# Patient Record
Sex: Male | Born: 1960 | Race: White | Hispanic: No | Marital: Single | Smoking: Never smoker
Health system: Southern US, Community
[De-identification: ages and names within clinical notes are randomized; demographics above are authoritative.]

## PROBLEM LIST (undated history)

## (undated) DIAGNOSIS — N289 Disorder of kidney and ureter, unspecified: Secondary | ICD-10-CM

## (undated) DIAGNOSIS — R569 Unspecified convulsions: Secondary | ICD-10-CM

## (undated) DIAGNOSIS — K219 Gastro-esophageal reflux disease without esophagitis: Secondary | ICD-10-CM

## (undated) DIAGNOSIS — E871 Hypo-osmolality and hyponatremia: Secondary | ICD-10-CM

## (undated) DIAGNOSIS — D649 Anemia, unspecified: Secondary | ICD-10-CM

## (undated) DIAGNOSIS — I10 Essential (primary) hypertension: Secondary | ICD-10-CM

## (undated) DIAGNOSIS — G43909 Migraine, unspecified, not intractable, without status migrainosus: Secondary | ICD-10-CM

## (undated) HISTORY — DX: Migraine, unspecified, not intractable, without status migrainosus: G43.909

## (undated) HISTORY — PX: OTHER SURGICAL HISTORY: SHX169

---

## 2010-07-18 ENCOUNTER — Emergency Department: Payer: Self-pay | Admitting: Emergency Medicine

## 2013-11-24 ENCOUNTER — Emergency Department: Payer: Self-pay | Admitting: Emergency Medicine

## 2013-11-26 ENCOUNTER — Inpatient Hospital Stay: Payer: Self-pay | Admitting: Internal Medicine

## 2013-11-26 LAB — CBC WITH DIFFERENTIAL/PLATELET
Basophil #: 0 10*3/uL (ref 0.0–0.1)
Basophil %: 0.3 %
Eosinophil #: 0 10*3/uL (ref 0.0–0.7)
Eosinophil %: 0.3 %
HCT: 26.6 % — ABNORMAL LOW (ref 40.0–52.0)
Lymphocyte %: 17.6 %
MCHC: 33.7 g/dL (ref 32.0–36.0)
MCV: 98 fL (ref 80–100)
Monocyte #: 0.6 x10 3/mm (ref 0.2–1.0)
Neutrophil %: 74.8 %
Platelet: 226 10*3/uL (ref 150–440)
RBC: 2.71 10*6/uL — ABNORMAL LOW (ref 4.40–5.90)
RDW: 14.2 % (ref 11.5–14.5)

## 2013-11-26 LAB — TROPONIN I: Troponin-I: <0.02 ng/mL

## 2013-11-26 LAB — COMPREHENSIVE METABOLIC PANEL
Albumin: 3.7 g/dL (ref 3.4–5.0)
Anion Gap: 12 (ref 7–16)
Bilirubin,Total: 0.8 mg/dL (ref 0.2–1.0)
Chloride: 109 mmol/L — ABNORMAL HIGH (ref 98–107)
Co2: 17 mmol/L — ABNORMAL LOW (ref 21–32)
Creatinine: 3.53 mg/dL — ABNORMAL HIGH (ref 0.60–1.30)
EGFR (African American): 21 — ABNORMAL LOW
Glucose: 122 mg/dL — ABNORMAL HIGH (ref 65–99)
Potassium: 4.8 mmol/L (ref 3.5–5.1)
Total Protein: 7.5 g/dL (ref 6.4–8.2)

## 2013-11-26 LAB — TSH: Thyroid Stimulating Horm: 1.97 u[IU]/mL

## 2013-11-26 LAB — ETHANOL: Ethanol %: <0.003 % (ref 0.000–0.080)

## 2013-11-27 LAB — URINALYSIS, COMPLETE
Bilirubin,UR: NEGATIVE
Glucose,UR: NEGATIVE mg/dL (ref 0–75)
Ketone: NEGATIVE
Leukocyte Esterase: NEGATIVE
Protein: NEGATIVE
Specific Gravity: 1.011 (ref 1.003–1.030)
WBC UR: 1 /HPF (ref 0–5)

## 2013-11-27 LAB — CBC WITH DIFFERENTIAL/PLATELET
Basophil %: 0.1 %
Eosinophil #: 0 10*3/uL (ref 0.0–0.7)
HCT: 23.3 % — ABNORMAL LOW (ref 40.0–52.0)
Lymphocyte #: 2.9 10*3/uL (ref 1.0–3.6)
Lymphocyte %: 31.7 %
MCH: 33.5 pg (ref 26.0–34.0)
MCHC: 34.2 g/dL (ref 32.0–36.0)
Monocyte %: 10.5 %
Neutrophil #: 5.3 10*3/uL (ref 1.4–6.5)
Neutrophil %: 57.3 %
Platelet: 192 10*3/uL (ref 150–440)
RBC: 2.38 10*6/uL — ABNORMAL LOW (ref 4.40–5.90)
RDW: 14.6 % — ABNORMAL HIGH (ref 11.5–14.5)
WBC: 9.2 10*3/uL (ref 3.8–10.6)

## 2013-11-27 LAB — BASIC METABOLIC PANEL
Anion Gap: 9 (ref 7–16)
Creatinine: 2.7 mg/dL — ABNORMAL HIGH (ref 0.60–1.30)
EGFR (African American): 30 — ABNORMAL LOW
EGFR (Non-African Amer.): 26 — ABNORMAL LOW
Osmolality: 294 (ref 275–301)
Potassium: 4.5 mmol/L (ref 3.5–5.1)
Sodium: 139 mmol/L (ref 136–145)

## 2013-11-27 LAB — DRUG SCREEN, URINE
Amphetamines, Ur Screen: NEGATIVE (ref ?–1000)
Barbiturates, Ur Screen: NEGATIVE (ref ?–200)
Cocaine Metabolite,Ur ~~LOC~~: NEGATIVE (ref ?–300)
MDMA (Ecstasy)Ur Screen: NEGATIVE (ref ?–500)
Methadone, Ur Screen: NEGATIVE (ref ?–300)
Opiate, Ur Screen: POSITIVE (ref ?–300)
Tricyclic, Ur Screen: NEGATIVE (ref ?–1000)

## 2013-11-27 LAB — LIPID PANEL
HDL Cholesterol: 29 mg/dL — ABNORMAL LOW (ref 40–60)
Ldl Cholesterol, Calc: 123 mg/dL — ABNORMAL HIGH (ref 0–100)
VLDL Cholesterol, Calc: 44 mg/dL — ABNORMAL HIGH (ref 5–40)

## 2013-11-27 LAB — PROTEIN / CREATININE RATIO, URINE
Creatinine, Urine: 67.1 mg/dL (ref 30.0–125.0)
Protein, Random Urine: 15 mg/dL — ABNORMAL HIGH (ref 0–12)
Protein/Creat. Ratio: 224 mg/gCREAT — ABNORMAL HIGH (ref 0–200)

## 2013-11-27 LAB — FERRITIN: Ferritin (ARMC): 564 ng/mL — ABNORMAL HIGH (ref 8–388)

## 2013-11-27 LAB — IRON AND TIBC
Iron Saturation: 61 %
Unbound Iron-Bind.Cap.: 109 ug/dL

## 2013-11-28 LAB — BASIC METABOLIC PANEL
Anion Gap: 5 — ABNORMAL LOW (ref 7–16)
BUN: 35 mg/dL — ABNORMAL HIGH (ref 7–18)
Calcium, Total: 8 mg/dL — ABNORMAL LOW (ref 8.5–10.1)
Chloride: 108 mmol/L — ABNORMAL HIGH (ref 98–107)
Creatinine: 1.92 mg/dL — ABNORMAL HIGH (ref 0.60–1.30)
EGFR (African American): 45 — ABNORMAL LOW
Glucose: 91 mg/dL (ref 65–99)
Sodium: 136 mmol/L (ref 136–145)

## 2013-11-28 LAB — SODIUM, URINE, RANDOM: Sodium, Urine Random: 48 mmol/L (ref 20–110)

## 2013-11-29 LAB — CBC WITH DIFFERENTIAL/PLATELET
Basophil #: 0 10*3/uL (ref 0.0–0.1)
Basophil %: 0.2 %
Eosinophil #: 0.1 10*3/uL (ref 0.0–0.7)
Eosinophil %: 1.8 %
Lymphocyte #: 1.7 10*3/uL (ref 1.0–3.6)
Lymphocyte %: 33.5 %
MCH: 33.1 pg (ref 26.0–34.0)
MCV: 98 fL (ref 80–100)
Neutrophil #: 2.8 10*3/uL (ref 1.4–6.5)
Neutrophil %: 53.5 %
RBC: 1.96 10*6/uL — ABNORMAL LOW (ref 4.40–5.90)

## 2013-11-29 LAB — BASIC METABOLIC PANEL
Anion Gap: 6 — ABNORMAL LOW (ref 7–16)
BUN: 23 mg/dL — ABNORMAL HIGH (ref 7–18)
Calcium, Total: 8.1 mg/dL — ABNORMAL LOW (ref 8.5–10.1)
Chloride: 111 mmol/L — ABNORMAL HIGH (ref 98–107)
Co2: 22 mmol/L (ref 21–32)
Creatinine: 1.62 mg/dL — ABNORMAL HIGH (ref 0.60–1.30)
EGFR (African American): 55 — ABNORMAL LOW
EGFR (Non-African Amer.): 47 — ABNORMAL LOW
Osmolality: 281 (ref 275–301)
Potassium: 4.9 mmol/L (ref 3.5–5.1)

## 2013-11-29 LAB — URINE CULTURE

## 2013-11-30 LAB — CBC WITH DIFFERENTIAL/PLATELET
Basophil #: 0 10*3/uL (ref 0.0–0.1)
Basophil %: 0.2 %
Eosinophil #: 0.1 10*3/uL (ref 0.0–0.7)
Eosinophil %: 2.2 %
HGB: 7.7 g/dL — ABNORMAL LOW (ref 13.0–18.0)
MCH: 31.5 pg (ref 26.0–34.0)
MCHC: 33.4 g/dL (ref 32.0–36.0)
MCV: 94 fL (ref 80–100)
Monocyte #: 0.7 x10 3/mm (ref 0.2–1.0)
Neutrophil #: 3.3 10*3/uL (ref 1.4–6.5)
Neutrophil %: 53 %
Platelet: 162 10*3/uL (ref 150–440)
RBC: 2.43 10*6/uL — ABNORMAL LOW (ref 4.40–5.90)

## 2013-11-30 LAB — BASIC METABOLIC PANEL
Anion Gap: 4 — ABNORMAL LOW (ref 7–16)
BUN: 17 mg/dL (ref 7–18)
Calcium, Total: 8.6 mg/dL (ref 8.5–10.1)
Chloride: 107 mmol/L (ref 98–107)
Creatinine: 1.71 mg/dL — ABNORMAL HIGH (ref 0.60–1.30)
EGFR (African American): 51 — ABNORMAL LOW
EGFR (Non-African Amer.): 44 — ABNORMAL LOW
Glucose: 93 mg/dL (ref 65–99)
Potassium: 4.7 mmol/L (ref 3.5–5.1)
Sodium: 137 mmol/L (ref 136–145)

## 2013-12-01 LAB — CBC WITH DIFFERENTIAL/PLATELET
Basophil #: 0 10*3/uL (ref 0.0–0.1)
Eosinophil #: 0.1 10*3/uL (ref 0.0–0.7)
Eosinophil %: 1.9 %
HGB: 8.1 g/dL — ABNORMAL LOW (ref 13.0–18.0)
MCHC: 34.6 g/dL (ref 32.0–36.0)
MCV: 94 fL (ref 80–100)
Monocyte %: 10.2 %
Neutrophil #: 3.9 10*3/uL (ref 1.4–6.5)
RDW: 14.9 % — ABNORMAL HIGH (ref 11.5–14.5)
WBC: 6.6 10*3/uL (ref 3.8–10.6)

## 2013-12-01 LAB — BASIC METABOLIC PANEL
Calcium, Total: 8.9 mg/dL (ref 8.5–10.1)
Co2: 27 mmol/L (ref 21–32)
Creatinine: 1.62 mg/dL — ABNORMAL HIGH (ref 0.60–1.30)
Osmolality: 271 (ref 275–301)

## 2013-12-01 LAB — UR PROT ELECTROPHORESIS, URINE RANDOM

## 2014-07-26 ENCOUNTER — Emergency Department: Payer: Self-pay | Admitting: Emergency Medicine

## 2014-07-26 LAB — URINALYSIS, COMPLETE
BILIRUBIN, UR: NEGATIVE
Bacteria: NONE SEEN
Blood: NEGATIVE
GLUCOSE, UR: NEGATIVE mg/dL (ref 0–75)
KETONE: NEGATIVE
Leukocyte Esterase: NEGATIVE
Nitrite: NEGATIVE
Ph: 6 (ref 4.5–8.0)
Protein: NEGATIVE
Specific Gravity: 1.006 (ref 1.003–1.030)
Squamous Epithelial: <1

## 2014-07-26 LAB — CBC WITH DIFFERENTIAL/PLATELET
Basophil #: 0 10*3/uL (ref 0.0–0.1)
Basophil %: 0.6 %
EOS ABS: 0.2 10*3/uL (ref 0.0–0.7)
Eosinophil %: 5.6 %
HCT: 34.7 % — AB (ref 40.0–52.0)
HGB: 11.5 g/dL — ABNORMAL LOW (ref 13.0–18.0)
Lymphocyte #: 1.3 10*3/uL (ref 1.0–3.6)
Lymphocyte %: 31.1 %
MCH: 33.2 pg (ref 26.0–34.0)
MCHC: 33.2 g/dL (ref 32.0–36.0)
MCV: 100 fL (ref 80–100)
MONO ABS: 0.4 x10 3/mm (ref 0.2–1.0)
Monocyte %: 10 %
NEUTROS ABS: 2.2 10*3/uL (ref 1.4–6.5)
NEUTROS PCT: 52.7 %
Platelet: 199 10*3/uL (ref 150–440)
RBC: 3.47 10*6/uL — ABNORMAL LOW (ref 4.40–5.90)
RDW: 13.7 % (ref 11.5–14.5)
WBC: 4.1 10*3/uL (ref 3.8–10.6)

## 2014-07-26 LAB — COMPREHENSIVE METABOLIC PANEL
ALBUMIN: 3.7 g/dL (ref 3.4–5.0)
ALT: 15 U/L
Alkaline Phosphatase: 57 U/L
Anion Gap: 12 (ref 7–16)
BUN: 16 mg/dL (ref 7–18)
Bilirubin,Total: 0.2 mg/dL (ref 0.2–1.0)
CHLORIDE: 91 mmol/L — AB (ref 98–107)
Calcium, Total: 8.8 mg/dL (ref 8.5–10.1)
Co2: 24 mmol/L (ref 21–32)
Creatinine: 1.13 mg/dL (ref 0.60–1.30)
EGFR (African American): >60
EGFR (Non-African Amer.): >60
GLUCOSE: 85 mg/dL (ref 65–99)
Osmolality: 256 (ref 275–301)
Potassium: 3.9 mmol/L (ref 3.5–5.1)
SGOT(AST): 13 U/L — ABNORMAL LOW (ref 15–37)
Sodium: 127 mmol/L — ABNORMAL LOW (ref 136–145)
Total Protein: 7.4 g/dL (ref 6.4–8.2)

## 2014-07-26 LAB — LIPASE, BLOOD: Lipase: 196 U/L (ref 73–393)

## 2014-07-26 LAB — VALPROIC ACID LEVEL: Valproic Acid: 82 ug/mL

## 2014-10-06 ENCOUNTER — Ambulatory Visit: Payer: Self-pay | Admitting: Nephrology

## 2014-10-06 LAB — COMPREHENSIVE METABOLIC PANEL
ANION GAP: 6 — AB (ref 7–16)
Albumin: 3.6 g/dL (ref 3.4–5.0)
Alkaline Phosphatase: 50 U/L
BILIRUBIN TOTAL: 0.3 mg/dL (ref 0.2–1.0)
BUN: 25 mg/dL — ABNORMAL HIGH (ref 7–18)
Calcium, Total: 8.5 mg/dL (ref 8.5–10.1)
Chloride: 101 mmol/L (ref 98–107)
Co2: 28 mmol/L (ref 21–32)
Creatinine: 1.1 mg/dL (ref 0.60–1.30)
GLUCOSE: 97 mg/dL (ref 65–99)
Osmolality: 274 (ref 275–301)
Potassium: 4.9 mmol/L (ref 3.5–5.1)
SGOT(AST): 19 U/L (ref 15–37)
SGPT (ALT): 17 U/L
SODIUM: 135 mmol/L — AB (ref 136–145)
TOTAL PROTEIN: 6.8 g/dL (ref 6.4–8.2)

## 2014-10-06 LAB — CBC WITH DIFFERENTIAL/PLATELET
BASOS ABS: 0 10*3/uL (ref 0.0–0.1)
BASOS PCT: 0.7 %
EOS ABS: 0.2 10*3/uL (ref 0.0–0.7)
EOS PCT: 4.4 %
HCT: 30 % — AB (ref 40.0–52.0)
HGB: 9.8 g/dL — ABNORMAL LOW (ref 13.0–18.0)
LYMPHS PCT: 38.6 %
Lymphocyte #: 1.7 10*3/uL (ref 1.0–3.6)
MCH: 32.5 pg (ref 26.0–34.0)
MCHC: 32.7 g/dL (ref 32.0–36.0)
MCV: 99 fL (ref 80–100)
MONO ABS: 0.5 x10 3/mm (ref 0.2–1.0)
Monocyte %: 10.6 %
Neutrophil #: 2 10*3/uL (ref 1.4–6.5)
Neutrophil %: 45.7 %
PLATELETS: 175 10*3/uL (ref 150–440)
RBC: 3.02 10*6/uL — AB (ref 4.40–5.90)
RDW: 14.3 % (ref 11.5–14.5)
WBC: 4.3 10*3/uL (ref 3.8–10.6)

## 2014-10-06 LAB — PROTEIN / CREATININE RATIO, URINE
Creatinine, Urine: 57.8 mg/dL (ref 30.0–125.0)
PROTEIN/CREAT. RATIO: 225 mg/g{creat} — AB (ref 0–200)
Protein, Random Urine: 13 mg/dL — ABNORMAL HIGH (ref 0–12)

## 2014-10-06 LAB — PHOSPHORUS: Phosphorus: 3.5 mg/dL (ref 2.5–4.9)

## 2015-01-27 ENCOUNTER — Inpatient Hospital Stay: Payer: Self-pay | Admitting: Internal Medicine

## 2015-03-10 ENCOUNTER — Emergency Department: Payer: Self-pay | Admitting: Emergency Medicine

## 2015-03-10 LAB — CBC
HCT: 27.1 % — ABNORMAL LOW (ref 40.0–52.0)
HGB: 9 g/dL — AB (ref 13.0–18.0)
MCH: 32.8 pg (ref 26.0–34.0)
MCHC: 33.1 g/dL (ref 32.0–36.0)
MCV: 99 fL (ref 80–100)
Platelet: 148 10*3/uL — ABNORMAL LOW (ref 150–440)
RBC: 2.73 10*6/uL — AB (ref 4.40–5.90)
RDW: 16.2 % — ABNORMAL HIGH (ref 11.5–14.5)
WBC: 3.8 10*3/uL (ref 3.8–10.6)

## 2015-03-10 LAB — BASIC METABOLIC PANEL
Anion Gap: 9 (ref 7–16)
BUN: 7 mg/dL
CALCIUM: 8.8 mg/dL — AB
CHLORIDE: 96 mmol/L — AB
Co2: 26 mmol/L
Creatinine: 1.19 mg/dL
EGFR (African American): >60
EGFR (Non-African Amer.): >60
Glucose: 87 mg/dL
POTASSIUM: 4.5 mmol/L
SODIUM: 131 mmol/L — AB

## 2015-04-08 NOTE — Discharge Summary (Signed)
PATIENT NAMELAKENDRICK, [name] MR#:  [id number] DATE OF BIRTH:  November 21, 1961  DATE OF ADMISSION:  11/26/2013 DATE OF DISCHARGE:  12/01/2013  DISCHARGE DIAGNOSES:  1. Acute on chronic kidney disease, likely due to acute tubular necrosis, now improving and close to baseline.  2. 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 Hemoglobin of 8.1 on the date of discharge status post 1 unit of packed red blood cell transfusion while in the hospital.   SECONDARY DIAGNOSES:  1. Hypertension.  2. Seizure disorder.   CONSULTATION: Nephrology, Dr. Anthonette Legato.   PROCEDURES AND RADIOLOGY: Chest x-ray on the 11th of December showed no acute cardiopulmonary disease.   Bilateral kidney ultrasound on the 11th of December showed medical renal disease. No focal mass or hydronephrosis.   MAJOR LABORATORY PANEL: UA on admission was negative.   Urine culture was contaminated on the 12th of December. Stool for C. difficile was negative on the 14th of December. Serum ANA was negative. ANCA panel was negative. Anti-glomerular basement membrane antibody was within normal limits. Serum complement C3 level was low with a value of 83. Serum complement C4 was within normal limits. Serum protein electrophoresis was within normal limits except low total serum protein with a value of 4.8 and low total albumin with a value of 2.8.   Urine eosinophil was negative. Urine protein electrophoresis was within normal limits.    Stool Hemoccult was negative.   HISTORY AND SHORT HOSPITAL COURSE: The patient is a 54 year old male with the above-mentioned medical problems who was admitted for generalized weakness and poor p.o. intake. He was found to have acute renal failure which was thought to be is likely secondary to ATN based on nephrology consultation. The patient was slowly improving on gentle hydration with underlying prolonged dehydration as well as concomitant use of  ARB/hydrochlorothiazide. The patient also had episode of diarrhea which was transient and was resolved during the inpatient hospital stay. No infectious etiology was detected. The patient was found to have anemia of chronic kidney disease which was worsening and required 1 unit of packed red blood cell transfusion, and his hemodynamics were stable with hemoglobin on the date of discharge of 8.1. He was close to his baseline and was discharged home on the 16th of December.   VITAL SIGNS: On the date of discharge, his vital signs were as follows: Temperature 98.4, heart rate 86 per minute, respirations 17 per minute, blood pressure 127/84 mmHg. He was saturating 96% on room air.   PERTINENT PHYSICAL EXAMINATION: On the date of discharge: CARDIOVASCULAR: S1, S2 normal. No murmurs, rubs or gallop.  LUNGS: Clear to auscultation bilaterally. No wheezing, rales, rhonchi or crepitation.  ABDOMEN: Soft, benign.  NEUROLOGIC: Nonfocal examination.   All other physical examination remained at baseline.   DISCHARGE MEDICATIONS:  1. Carbamazepine 200 mg p.o. daily.  2. Propranolol 40 mg 1/2 tablet p.o. at bedtime.  3. Divalproex sodium per home dosage.   DISCHARGE DIET: Regular.   DISCHARGE ACTIVITY: As tolerated.   DISCHARGE INSTRUCTIONS AND FOLLOWUP: The patient was instructed to follow up with his primary care physician, Dr. [name], in 1 to 2 weeks. He will need followup with Dr. Murlean Iba from nephrology in 2 to 4 weeks.   TOTAL TIME DISCHARGING THIS PATIENT: 45 minutes.    ____________________________ Lucina Mellow. Manuella Ghazi, MD vss:gb D: 12/02/2013 23:17:20 ET T: 12/03/2013 02:44:44 ET JOB#: [id number]  cc: Garrie Elenes S. Manuella Ghazi, MD, <Dictator> Munsoor Lilian Kapur, MD Youlanda Roys. Lovie Macadamia,  MD  Remer Macho MD ELECTRONICALLY SIGNED 12/07/2013 7:48

## 2015-04-08 NOTE — H&P (Signed)
PATIENT NAMEMITESH, [name] MR#:  [id number] DATE OF BIRTH:  17-Oct-1961  DATE OF ADMISSION:  11/26/2013  PRIMARY CARE PHYSICIAN: None local.  REFERRING PHYSICIAN:  Dr. Thomasene Lot.  CHIEF COMPLAINT: Generalized weakness, poor oral intake for 1 month, worsening for the past 2 days.   HISTORY OF PRESENT ILLNESS: A 54 year old Caucasian male with a history of hypertension, seizure disorder, chronic speech problem, and transgender, who presented to the ED with the above chief complaint. The patient is alert, awake, oriented, has a tremor, slurred speech and mild confusion.   According to the patient, the patient has had generalized weakness for the past 1 month with very poor oral intake and worsening for the past 2 days. The patient even could not walk properly. He feels thirsty and dizzy, but the patient denies any fever or chills. No headache. No chest pain, palpitation. No abdominal pain, nausea, vomiting or diarrhea The patient denies any melena or bloody stool.   The patient was noted to have an increased BUN 72, creatinine at 3.52 and is admitted for acute renal failure.   PAST MEDICAL HISTORY: Hypertension, seizure disorder.   PAST SURGICAL HISTORY: No.   SOCIAL HISTORY: No smoking or drinking or illicit drugs and is not sexually active. Denies any sexually transmitted disease.   FAMILY HISTORY: Father had a heart attack and stroke.   ALLERGIES: None.   HOME MEDICATIONS: Depakote 500 mg p.o. b.i.d., propranolol 60 mg p.o. daily.   REVIEW OF SYSTEMS: CONSTITUTIONAL: Denies any fever or chills. No headache but has dizziness and weakness.  EYES: No double vision, blurry vision.  ENT: No postnasal drip but has a slurred speech and no dysphagia.  CARDIOVASCULAR: No chest pain, palpitation, orthopnea, or nocturnal dyspnea. No leg edema.  PULMONARY: No cough, sputum, shortness of breath or hemoptysis.  GASTROINTESTINAL: No abdominal pain, nausea, vomiting or diarrhea. No melena or bloody  stool.  GENITOURINARY: No dysuria, hematuria, or incontinence.  SKIN: No rash or jaundice.  NEUROLOGY: No syncope, loss of consciousness or seizure.  HEMATOLOGIC: No easy bruising or bleeding.  ENDOCRINE: No polyuria, polydipsia, heat or cold intolerance.   PHYSICAL EXAMINATION:  VITAL SIGNS: Temperature 97.6, blood pressure 138/62, pulse 78, respirations 18,  GENERAL: Awake, alert, oriented, in no acute distress.  HEENT: Pupils round, equal and reactive to light and accommodation. Dry oral mucosa. Clear oropharynx.  NECK: Supple. No JVD or carotid bruits. No lymphadenopathy. No thyromegaly.  CARDIOVASCULAR: S1, S2, regular rate and rhythm. No murmurs or gallops.  PULMONARY: Bilateral air entry. No wheezing or rales. No use of accessory muscle to breathe. ABDOMEN:  Soft. No distention or tenderness. No organomegaly. Bowel sounds present.  EXTREMITIES: No edema, clubbing or cyanosis. No calf tenderness. Bilateral pedal pulses present.  SKIN: No rash or jaundice.  NEUROLOGIC: A and O x3. No focal deficit. Power 5/5. Sensation intact.   LABORATORY DATA: WBC 8.1, hemoglobin 9.0, platelets 226. Glucose 122, BUN 72, creatinine 3.53, sodium 138, potassium 4.8, chloride 107, bicarb 17, anion gap 12. TSH 1.97. Troponin less than 0.02. Ethanol level less than 3.   EKG showed normal sinus rhythm at 68 BPM.   IMPRESSIONS:  1.  Acute renal failure.  2.  Anemia.  3.  Hypertension.  4.  Seizure disorder.     PLAN OF TREATMENT:  1.  The patient will be admitted to the medical floor. We will start on normal saline and follow up BMP, kidney and bladder ultrasound. We will get a nephrology consult.  2.  For anemia, will follow up CBC.  3.  For hypertension, will continue propranolol.  4.  For seizure disorder, continue Depakote.  5.  GI and DVT prophylaxes.   I discussed the patient's condition and the plan of treatment with the patient.   CODE STATUS: THE PATIENT WANTS FULL CODE.   TIME SPENT:  About 58 minutes.    ____________________________ Demetrios Loll, MD qc:np D: 11/26/2013 20:31:25 ET T: 11/26/2013 21:37:52 ET JOB#: [id number]  cc: Demetrios Loll, MD, <Dictator> Demetrios Loll MD ELECTRONICALLY SIGNED 11/30/2013 14:37

## 2015-04-09 NOTE — Consult Note (Signed)
PATIENT NAME:  [name], [name] MR#:  [id number] DATE OF BIRTH:  08/31/1961  DATE OF CONSULTATION:  11/27/2013  REFERRING PHYSICIAN:  Demetrios Loll, MD CONSULTING PHYSICIAN:  Alika Saladin Lilian Kapur, MD  REASON FOR CONSULTATION: Acute renal failure.   HISTORY OF PRESENT ILLNESS: The patient is a very pleasant 54 year old Caucasian male with past medical history of hypertension, seizure disorder, chronic speech impediment, who presented to [facility] with generalized weakness and poor p.o. intake. The patient reports that he has had rather poor p.o. intake over the past month and has been eating and drinking very little. He states that he had an upper respiratory infection and lost much of his appetite thereafter. His weakness has been progressive in nature, especially over the past 2 days. Upon presentation here, the patient was found to have a creatinine of 3.53, BUN of 72, serum bicarbonate of 17. At home, he states that he was taking valsartan/hydrochlorothiazide and kept taking this despite poor p.o. intake. He had a urinalysis here which was negative for protein, less than 1 RBC per high-power field and 1 WBC per high-power field. He was started on IV fluid hydration with 0.9 normal saline at 150 mL per hour. This has lead to a creatinine now of 2.7. He also had a renal ultrasound performed which was negative for hydronephrosis; however, there was increased echogenicity bilateral, which is consistent with medical renal disease. We called Dr. Reuel Boom office, with whom he follows with for primary care, and it appears that on 06/23/2013 his creatinine was 0.9. Therefore, it appears that he most likely has acute renal failure at this time.   PAST MEDICAL HISTORY:  1. Hypertension.  2. Seizure disorder.  3. Chronic speech impediment.   PAST SURGICAL HISTORY: None.  ALLERGIES: No known drug allergies.   CURRENT INPATIENT MEDICATIONS: Include: 1. Acetaminophen 650 mg p.o. q.4 hours  p.r.n. 2. Heparin 5000 subcutaneous q.8 hours. 3. Zofran 4 mg IV q.4 hours p.r.n. 4. Pantoprazole 40 mg p.o. daily. 5. Depakote 500 mg p.o. q.12 hours.  6. Morphine 0.5 mg IV q.6 hours.  7. Propranolol 60 mg p.o. daily.   SOCIAL HISTORY: The patient resides at home. He is single. He has no children. He works as a IT sales professional. He denies tobacco use. Drinks alcohol occasionally. Denies illicit drug use.   FAMILY HISTORY: The patient's father apparently had polycystic kidney disease and then developed renal cell carcinoma. He also had some type of a liver malignancy. He was never on dialysis.   REVIEW OF SYSTEMS:  CONSTITUTIONAL: The patient reports significantly decreased appetite and poor p.o. intake. He is unclear as to whether he has had significant weight loss.  EYES: Denies diplopia or blurry vision.  HEENT: Denies headaches or hearing loss. Denies epistaxis.  CARDIOVASCULAR: Denies chest pain, palpitations, PND.  RESPIRATORY: Denies cough, shortness of breath or hemoptysis.  GASTROINTESTINAL: Reports diminished appetite and some nausea, but denies vomiting. Denies blood in the stool.  GENITOURINARY: Denies frequency, urgency or dysuria.  MUSCULOSKELETAL: Denies joint pain, swelling or redness.  INTEGUMENTARY: Denies skin rashes or lesions.  NEUROLOGIC: Has history of seizure disorder.  PSYCHIATRIC: Denies history of bipolar disorder or depression.  ENDOCRINE: Denies polyuria, polydipsia or polyphagia.  HEMATOLOGIC AND LYMPHATIC: Denies easy bruisability, bleeding or swollen lymph nodes.  ALLERGY AND IMMUNOLOGIC: Denies seasonal allergies or history of immunodeficiency.   PHYSICAL EXAMINATION:  VITAL SIGNS: Temperature 98.4, pulse 67, respirations 19, blood pressure 86/52.  GENERAL: Reveals a disheveled male, currently in no  acute distress.  HEENT: Normocephalic, atraumatic. Extraocular movements are intact. Pupils equal, round and reactive to light. No scleral icterus.  Conjunctivae are pale. No epistaxis noted. Gross hearing intact. Oral mucosa is quite dry.  NECK: Supple, without JVD or lymphadenopathy.  LUNGS: Clear to auscultation bilaterally with normal respiratory effort.  CARDIOVASCULAR: S1, S2. Regular rate and rhythm. No murmurs, rubs or gallops appreciated.  ABDOMEN: Soft, nontender, nondistended. Bowel sounds positive. No rebound or guarding. No gross organomegaly appreciated.  EXTREMITIES: No clubbing, cyanosis or edema.  NEUROLOGIC: The patient is alert and oriented to time, person and place. His speech is slow at times, and there is some mild stuttering noted.  SKIN: Warm and dry. Decreased skin turgor noted.  MUSCULOSKELETAL: No joint redness, swelling or tenderness appreciated.  PSYCHIATRIC: The patient with appropriate affect and appears to have good insight into his current illness.   LABORATORY DATA: Sodium 139, potassium 4.5, chloride 112, CO2 18, BUN 59, creatinine 2.7, glucose 103. Iron saturation 61%. Total protein 7.5, albumin 3.7, total bilirubin 0.8, alkaline phosphatase 39, AST 22, ALT 20. CK of 61. Troponin of less than 0.02. TSH 1.97. A urine drug screen is positive for opiates. CBC shows WBC is 9.2, hemoglobin 8, hematocrit 23, platelets 192, MCV of 98. Urine protein to creatinine ratio is 0.2. Urinalysis is negative for blood, negative for protein, less than 1 RBC per high-power field, 1 WBC per high-power field.   IMPRESSION: This is a 54 year old Caucasian male with a past medical history of hypertension, seizure disorder, chronic speech disorder, who presented to [facility] with decreased p.o. intake for 1 month and found to have acute renal failure.   1. Acute renal failure. The patient has acute tubular necrosis as a cause of his acute renal failure. The most likely cause is prolonged dehydration as well as concomitant use of valsartan/hydrochlorothiazide at home. The patient is responding to IV fluid  hydration, though his creatinine has not yet normalized. Creatinine currently is 2.7 after hydration overnight. Renal ultrasound was negative for hydronephrosis, though there was increased echogenicity bilateral. We will perform further workup including SPEP, UPEP, ANA, ANCA antibodies, GBM antibodies, C3, C4. No indication for dialysis at this point in time.  2. Metabolic acidosis. Serum bicarbonate was found to be 18. This is slightly better than yesterday. If serum bicarbonate remains low, we may need to consider bicarbonate drip, but will hold off for now.  3. Anemia, not otherwise specified. The patient's hemoglobin was 9 upon admission and now down to 8. In part, this may be dilutional. Iron studies showed an iron saturation of 61%. We will check SPEP and UPEP as well.  4. Hypertension. Blood pressure actually a bit low at present. We agree with holding valsartan/hydrochlorothiazide at this point in time. The patient is on propranolol at present.   I would like to thank Dr. Bridgett Larsson for this kind referral. Further plan as the patient progresses.    ____________________________ Tama High, MD mnl:lb D: 11/27/2013 14:52:34 ET T: 11/27/2013 15:04:41 ET JOB#: [id number]  cc: Tama High, MD, <Dictator> Mariah Milling Elvy Mclarty MD ELECTRONICALLY SIGNED 12/24/2013 11:25

## 2015-04-17 ENCOUNTER — Observation Stay
Admission: EM | Admit: 2015-04-17 | Discharge: 2015-04-18 | Disposition: A | Discharge status: Home / Self Care | Payer: Self-pay | Attending: Internal Medicine | Admitting: Internal Medicine

## 2015-04-17 ENCOUNTER — Encounter: Payer: Self-pay | Admitting: *Deleted

## 2015-04-17 ENCOUNTER — Observation Stay: Payer: Self-pay

## 2015-04-17 DIAGNOSIS — I10 Essential (primary) hypertension: Secondary | ICD-10-CM | POA: Insufficient documentation

## 2015-04-17 DIAGNOSIS — D649 Anemia, unspecified: Secondary | ICD-10-CM | POA: Insufficient documentation

## 2015-04-17 DIAGNOSIS — Z79899 Other long term (current) drug therapy: Secondary | ICD-10-CM | POA: Insufficient documentation

## 2015-04-17 DIAGNOSIS — R0789 Other chest pain: Principal | ICD-10-CM | POA: Insufficient documentation

## 2015-04-17 DIAGNOSIS — R569 Unspecified convulsions: Secondary | ICD-10-CM | POA: Insufficient documentation

## 2015-04-17 DIAGNOSIS — I129 Hypertensive chronic kidney disease with stage 1 through stage 4 chronic kidney disease, or unspecified chronic kidney disease: Secondary | ICD-10-CM | POA: Insufficient documentation

## 2015-04-17 DIAGNOSIS — N183 Chronic kidney disease, stage 3 (moderate): Secondary | ICD-10-CM | POA: Insufficient documentation

## 2015-04-17 DIAGNOSIS — R079 Chest pain, unspecified: Secondary | ICD-10-CM

## 2015-04-17 DIAGNOSIS — Z8249 Family history of ischemic heart disease and other diseases of the circulatory system: Secondary | ICD-10-CM | POA: Insufficient documentation

## 2015-04-17 DIAGNOSIS — I259 Chronic ischemic heart disease, unspecified: Secondary | ICD-10-CM | POA: Diagnosis present

## 2015-04-17 DIAGNOSIS — Z8489 Family history of other specified conditions: Secondary | ICD-10-CM | POA: Insufficient documentation

## 2015-04-17 HISTORY — DX: Essential (primary) hypertension: I10

## 2015-04-17 HISTORY — DX: Disorder of kidney and ureter, unspecified: N28.9

## 2015-04-17 HISTORY — DX: Unspecified convulsions: R56.9

## 2015-04-17 HISTORY — DX: Anemia, unspecified: D64.9

## 2015-04-17 LAB — CBC WITH DIFFERENTIAL/PLATELET
Basophils Absolute: 0 10*3/uL (ref 0–0.1)
EOS ABS: 0 10*3/uL (ref 0–0.7)
Eosinophils Relative: 1 %
HCT: 30.1 % — ABNORMAL LOW (ref 40.0–52.0)
Hemoglobin: 10.3 g/dL — ABNORMAL LOW (ref 13.0–18.0)
Lymphocytes Relative: 40 %
Lymphs Abs: 1 10*3/uL (ref 1.0–3.6)
MCH: 34.7 pg — ABNORMAL HIGH (ref 26.0–34.0)
MCHC: 34.1 g/dL (ref 32.0–36.0)
MCV: 102 fL — AB (ref 80.0–100.0)
MONO ABS: 0.1 10*3/uL — AB (ref 0.2–1.0)
Monocytes Relative: 5 %
Neutro Abs: 1.4 10*3/uL (ref 1.4–6.5)
Neutrophils Relative %: 54 %
Platelets: 216 10*3/uL (ref 150–440)
RBC: 2.95 MIL/uL — AB (ref 4.40–5.90)
RDW: 15.6 % — AB (ref 11.5–14.5)
WBC: 2.6 10*3/uL — AB (ref 3.8–10.6)

## 2015-04-17 LAB — COMPREHENSIVE METABOLIC PANEL
ALBUMIN: 4 g/dL (ref 3.5–5.0)
ALT: 14 U/L — ABNORMAL LOW (ref 17–63)
ANION GAP: 14 (ref 5–15)
AST: 22 U/L (ref 15–41)
Alkaline Phosphatase: 36 U/L — ABNORMAL LOW (ref 38–126)
BUN: 33 mg/dL — AB (ref 6–20)
CALCIUM: 9.9 mg/dL (ref 8.9–10.3)
CO2: 19 mmol/L — ABNORMAL LOW (ref 22–32)
CREATININE: 1.37 mg/dL — AB (ref 0.61–1.24)
Chloride: 104 mmol/L (ref 101–111)
GFR calc Af Amer: >60 mL/min (ref >60)
GFR calc non Af Amer: 57 mL/min — ABNORMAL LOW (ref >60)
Glucose, Bld: 171 mg/dL — ABNORMAL HIGH (ref 65–99)
POTASSIUM: 3 mmol/L — AB (ref 3.5–5.1)
Sodium: 137 mmol/L (ref 135–145)
Total Bilirubin: 0.4 mg/dL (ref 0.3–1.2)
Total Protein: 6.8 g/dL (ref 6.5–8.1)

## 2015-04-17 LAB — TROPONIN I: Troponin I: <0.03 ng/mL (ref <0.031)

## 2015-04-17 MED ORDER — CARBAMAZEPINE 200 MG PO TABS
200.0000 mg | ORAL_TABLET | Freq: Every day | ORAL | Status: DC
  Filled 2015-04-17: qty 1

## 2015-04-17 MED ORDER — LISINOPRIL 20 MG PO TABS
20.0000 mg | ORAL_TABLET | Freq: Every day | ORAL | Status: DC
  Administered 2015-04-17: 20 mg via ORAL
  Filled 2015-04-17 (×2): qty 1

## 2015-04-17 MED ORDER — FERROUS SULFATE 325 (65 FE) MG PO TABS
325.0000 mg | ORAL_TABLET | Freq: Two times a day (BID) | ORAL | Status: DC
  Administered 2015-04-17 – 2015-04-18 (×2): 325 mg via ORAL
  Filled 2015-04-17 (×3): qty 1

## 2015-04-17 MED ORDER — CARBAMAZEPINE 200 MG PO TABS
200.0000 mg | ORAL_TABLET | Freq: Three times a day (TID) | ORAL | Status: DC
  Administered 2015-04-17: 200 mg via ORAL

## 2015-04-17 MED ORDER — CARBAMAZEPINE 200 MG PO TABS
400.0000 mg | ORAL_TABLET | Freq: Two times a day (BID) | ORAL | Status: DC
  Administered 2015-04-17: 200 mg via ORAL
  Administered 2015-04-18: 400 mg via ORAL
  Filled 2015-04-17 (×2): qty 2

## 2015-04-17 MED ORDER — ENSURE ENLIVE PO LIQD: 237.0000 mL | Freq: Two times a day (BID) | ORAL | Status: DC

## 2015-04-17 MED ORDER — VITAMIN D (ERGOCALCIFEROL) 1.25 MG (50000 UNIT) PO CAPS
50000.0000 [IU] | ORAL_CAPSULE | ORAL | Status: DC
  Administered 2015-04-17: 50000 [IU] via ORAL
  Filled 2015-04-17: qty 1

## 2015-04-17 MED ORDER — HEPARIN SODIUM (PORCINE) 5000 UNIT/ML IJ SOLN
5000.0000 [IU] | Freq: Three times a day (TID) | INTRAMUSCULAR | Status: DC
  Administered 2015-04-17 – 2015-04-18 (×2): 5000 [IU] via SUBCUTANEOUS
  Filled 2015-04-17 (×2): qty 1

## 2015-04-17 MED ORDER — ASPIRIN 81 MG PO CHEW
CHEWABLE_TABLET | ORAL | Status: AC
  Filled 2015-04-17: qty 2

## 2015-04-17 MED ORDER — VITAMIN D2 50 MCG (2000 UT) PO TABS: 1.0000 | ORAL_TABLET | Freq: Two times a day (BID) | ORAL | Status: DC

## 2015-04-17 MED ORDER — DIVALPROEX SODIUM 500 MG PO DR TAB
500.0000 mg | DELAYED_RELEASE_TABLET | Freq: Every day | ORAL | Status: DC
  Filled 2015-04-17: qty 1

## 2015-04-17 MED ORDER — DIVALPROEX SODIUM 500 MG PO DR TAB
DELAYED_RELEASE_TABLET | ORAL | Status: AC
  Filled 2015-04-17: qty 1

## 2015-04-17 MED ORDER — ONDANSETRON HCL 4 MG/2ML IJ SOLN: 4.0000 mg | Freq: Four times a day (QID) | INTRAMUSCULAR | Status: DC | PRN

## 2015-04-17 MED ORDER — DIVALPROEX SODIUM 500 MG PO DR TAB
1000.0000 mg | DELAYED_RELEASE_TABLET | Freq: Two times a day (BID) | ORAL | Status: DC
  Administered 2015-04-17 – 2015-04-18 (×2): 1000 mg via ORAL
  Filled 2015-04-17 (×3): qty 2

## 2015-04-17 MED ORDER — CARBAMAZEPINE 200 MG PO TABS
ORAL_TABLET | ORAL | Status: AC
  Filled 2015-04-17: qty 1

## 2015-04-17 MED ORDER — ASPIRIN 81 MG PO CHEW
162.0000 mg | CHEWABLE_TABLET | Freq: Once | ORAL | Status: AC
  Administered 2015-04-17: 162 mg via ORAL

## 2015-04-17 MED ORDER — ACETAMINOPHEN 325 MG PO TABS: 650.0000 mg | ORAL_TABLET | ORAL | Status: DC | PRN

## 2015-04-17 MED ORDER — METOPROLOL TARTRATE 25 MG PO TABS: 25.0000 mg | ORAL_TABLET | Freq: Two times a day (BID) | ORAL | Status: DC

## 2015-04-17 MED ORDER — PROPRANOLOL HCL 10 MG PO TABS
40.0000 mg | ORAL_TABLET | Freq: Two times a day (BID) | ORAL | Status: DC
  Administered 2015-04-17 – 2015-04-18 (×2): 40 mg via ORAL
  Filled 2015-04-17: qty 4
  Filled 2015-04-17: qty 1
  Filled 2015-04-17: qty 4

## 2015-04-17 MED ORDER — DIVALPROEX SODIUM 250 MG PO DR TAB: 1000.0000 mg | DELAYED_RELEASE_TABLET | Freq: Every day | ORAL | Status: DC

## 2015-04-17 MED ORDER — AMLODIPINE BESYLATE 5 MG PO TABS
5.0000 mg | ORAL_TABLET | Freq: Every day | ORAL | Status: DC
  Administered 2015-04-17: 5 mg via ORAL
  Filled 2015-04-17 (×2): qty 1

## 2015-04-17 MED ORDER — ASPIRIN EC 325 MG PO TBEC
325.0000 mg | DELAYED_RELEASE_TABLET | Freq: Every day | ORAL | Status: DC
  Administered 2015-04-18: 325 mg via ORAL
  Filled 2015-04-17: qty 1

## 2015-04-17 MED ORDER — DIVALPROEX SODIUM 500 MG PO DR TAB
500.0000 mg | DELAYED_RELEASE_TABLET | Freq: Three times a day (TID) | ORAL | Status: DC
  Administered 2015-04-17: 500 mg via ORAL
  Filled 2015-04-17: qty 2

## 2015-04-17 MED ORDER — POTASSIUM CHLORIDE CRYS ER 20 MEQ PO TBCR
20.0000 meq | EXTENDED_RELEASE_TABLET | Freq: Once | ORAL | Status: AC
  Administered 2015-04-17: 20 meq via ORAL
  Filled 2015-04-17: qty 1

## 2015-04-17 MED ORDER — DIVALPROEX SODIUM 250 MG PO DR TAB: 500.0000 mg | DELAYED_RELEASE_TABLET | Freq: Every day | ORAL | Status: DC

## 2015-04-17 NOTE — Progress Notes (Signed)
Spoke w/MD re patient's potassium level of 3.0

## 2015-04-17 NOTE — ED Notes (Addendum)
Pt to ED via EMS after syncopal episode after eating lunch. Pt states got up from table too fast and passed out, per EMS loss of loc and hit back of head along with tailbone. Pt c/c of tailbone pain at this time, denies HA, lightheadedness, or dizziness. Pt is AAOx3, vitals wnl, no acute distress noted. No laceration or hematoma noted to back of head.

## 2015-04-17 NOTE — H&P (Signed)
PATIENT NAME:  [name], [name] MR#:  [id number] DATE OF BIRTH:  06/11/1961  DATE OF ADMISSION:  01/27/2015  PRIMARY CARE PROVIDER: Youlanda Roys. Lovie Macadamia, MD  EMERGENCY DEPARTMENT REFERRING PHYSICIAN: Dr. Archie Balboa   CHIEF COMPLAINT: Nausea, vomiting, hyponatremia, generalized weakness.   HISTORY OF PRESENT ILLNESS: The patient is a 54 year old white male who reports that for the past few days he has not been feeling well and started with nausea and vomiting the day before yesterday. Yesterday it was severe. He could not keep anything down, unable to drink any fluids, unable to eat any food. The patient also started having weakness and difficulty with ambulation. He came to the ED and was noted to have a sodium of 119. The patient appears very dehydrated. He otherwise denies any fevers, chills. No chest pains. No palpitations. No shortness of breath. He also complains of nonproductive cough ongoing for the past 8 days. Denies any diarrhea or any blood in the stool.   PAST MEDICAL HISTORY: Significant for hypertension, history of seizure disorder, history of anemia of chronic disease.   PAST SURGICAL HISTORY: None.   SOCIAL HISTORY: No smoking, drinking or illicit drug use.   FAMILY HISTORY: Father had a heart attack and a stroke.   ALLERGIES: None.   MEDICATIONS AT HOME: He is on propranolol 20 two tablets 2 times a day, lisinopril 20 daily, iron sulfate 325 mg 1 tab p.o. b.i.d., valproic acid 500 mg 1 tab p.o. daily at noon, valproic acid 1000 mg b.i.d. in the morning and evening, carbamazepine 400 mg 2 times daily, carbamazepine 200 mg once at noon, amlodipine 5 daily.   SOCIAL HISTORY: No smoking, drinking or illicit drug use.   REVIEW OF SYSTEMS: CONSTITUTIONAL: Complains of fatigue, weakness. No weight loss or weight gain.  EYES: No blurred or double vision. No redness. No inflammation. No glaucoma. No cataracts.  ENT: No tinnitus. No ear pain. No hearing loss. No seasonal year round  allergies. No epistaxis. No difficulty swallowing.  RESPIRATORY: Complains of dry cough. No wheezing. No COPD.   CARDIOVASCULAR: Denies any chest pain, orthopnea, edema.  GASTROINTESTINAL: Complains of nausea, vomiting, but no diarrhea. No abdominal pain. No hematemesis.   GENITOURINARY: Denies any dysuria, hematuria, renal calculus or frequency.  ENDOCRINE: Denies any polyuria or nocturia, or thyroid problems.  HEMATOLOGIC AND LYMPHATIC: Denies anemia, easy bruisability or bleeding.  SKIN: No acne. No rash.  MUSCULOSKELETAL: Denies any pain in the neck, back or shoulder.  NEUROLOGIC: History of seizure disorder.  PSYCHIATRIC: Denies any anxiety, insomnia, or ADD.   PHYSICAL EXAMINATION: VITAL SIGNS: Temperature 99, pulse 89, respirations 18, blood pressure 186/138.  GENERAL: The patient is a well-developed male, appears very dry, in no acute distress.  HEENT: Head atraumatic, normocephalic. Pupils equally round, reactive to light and accommodation. There is no conjunctival pallor. No sclerae icterus. Nasal exam shows no drainage or ulceration. Oropharynx is clear without any exudate.  NECK: Supple without any thyromegaly.  CARDIOVASCULAR: Regular rate and rhythm. No murmurs, rubs, clicks, or gallops.  LUNGS: Clear to auscultation bilaterally without any rales, rhonchi, wheezing.  ABDOMEN: Soft, nontender, nondistended. Positive bowel sounds x 4. No hepatosplenomegaly.  EXTREMITIES: No clubbing, cyanosis, or edema.  SKIN: No rash.  LYMPH NODES: Nonpalpable.  MUSCULOSKELETAL: There is no erythema or swelling.  VASCULAR: Good DP, PT pulses.  PSYCHIATRIC: Not anxious or depressed.  NEUROLOGIC: Awake, alert, and oriented x 3. No focal deficits.   LABORATORY DATA: WBC 4.2, hemoglobin 10.7, platelet count is 196,000. Glucose  90, BUN 13, creatinine 1.29, sodium 119, potassium 3.5, chloride 81, CO2 is 25. Valproic acid level 76. Tegretol was slightly elevated at 13.2.   ASSESSMENT AND PLAN: The  patient is a 54 year old white male with history of hypertension and seizure disorder who presents with nausea, vomiting, noted to have severe hyponatremia.  1.  Hyponatremia, likely due to dehydration. At this time, we will give him IV fluids and follow his sodium level.  2.  Nausea and vomiting, likely due to gastritis. At this time, we will give him IV fluids antiemetics. If no improvement, then we will consider CT of the abdomen and a GI evaluation.  3.  Seizure disorder. Continue carbamazepine and valproic acid.  4.  Accelerated hypertension. Continue lisinopril and amlodipine and propranolol. Will add p.r.n. hydralazine.  5.  Possible acute bronchitis. I will treat him with p.o. Levaquin for the time being.  6.  Miscellaneous. He will be on Lovenox for deep vein thrombosis prophylaxis.   TIME SPENT: 50 minutes on this patient.    ____________________________ Lafonda Mosses. Posey Pronto, MD shp:at D: 01/27/2015 15:11:13 ET T: 01/27/2015 15:32:32 ET JOB#: [id number]  cc: Lovely Kerins H. Posey Pronto, MD, <Dictator> Alric Seton MD ELECTRONICALLY SIGNED 02/04/2015 15:53

## 2015-04-17 NOTE — Discharge Summary (Signed)
PATIENT NAMEKIMM, [name] MR#:  [id number] DATE OF BIRTH:  December 06, 1961  DATE OF ADMISSION:  01/27/2015 DATE OF DISCHARGE:  01/29/2015  ADMITTING DIAGNOSIS: Nausea, vomiting.   DISCHARGE DIAGNOSES: 1.  Nausea, vomiting, likely due to acute gastritis, now resolved.  2.  Hyponatremia due to nausea, vomiting, now sodium much improved. 3.  Seizure disorder.  4.  Accelerated hypertension. Blood pressure better on discharge.  5.  Urinary retention. The patient's symptoms improved with Flomax.  6.  Possible acute bronchitis.  7.  History of seizure disorder.  8.  History of anemia of chronic disease.  9.  Chronic renal failure.  CONSULTANTS: None.   PERTINENT LABORATORY DATA AND EVALUATIONS: Admitting glucose 90, BUN 13, creatinine 1.29, sodium 119, potassium 3.5, chloride 81, CO2 of 25, calcium 9.3. Tegretol level was slightly elevated at 13.2. Valproic acid 7.6. WBC 4.2, hemoglobin 10.7, platelet count was 196,000.    HOSPITAL COURSE: Please refer to H and P done by me on admission. The patient is a 54 year old white male who presented with complaint of nausea and not feeling well. The patient was noted to have severe hyponatremia on presentation. He was given IV fluids with  improvement in his hyponatremia. The patient's nausea and vomiting was thought to be due to gastritis, which also resolved. The patient does have some chronic renal failure and is followed by nephrology. He also was complaining of some urinary retention for which he was started on some Flomax and Bethanechol with improvement in his symptoms. The patient had some mild cough and thought to have acute bronchitis, which was treated with levofloxacin. He is doing much better and is stable for discharge.   DISCHARGE MEDICATIONS:  Carbamazepine 200, 1 tab p.o. every noon, propranolol 20, 2 tabs 2 times a day, iron sulfate 325, 1 tab p.o. b.i.d., valproic acid 500, 1 tab p.o. daily, amlodipine 5 daily, lisinopril 20 daily,  carbamazepine 400 mg 2 times a day in the morning and evening, valproic acid 500, 2 tabs 2 times a day in the morning and evening, Flomax 0.4 daily, Bethanechol 25 mg 1 tab p.o. t.i.d., levofloxacin 500, 1 tab p.o. q. 24 x 4 days, Robitussin 5 mL t.i.d. as needed for cough.   FOLLOWUP: With Dr. Lovie Macadamia in 1-2 weeks.   DIET: Regular.   ACTIVITY: As tolerated.   TIME SPENT ON THIS DISCHARGE: 35 minutes.   ____________________________ Lafonda Mosses Posey Pronto, MD shp:LT D: 01/30/2015 18:10:09 ET T: 01/30/2015 20:18:02 ET JOB#: [id number]  cc: Camary Sosa H. Posey Pronto, MD, <Dictator> Alric Seton MD ELECTRONICALLY SIGNED 02/04/2015 15:54

## 2015-04-17 NOTE — H&P (Signed)
[name], is a 54 y.o. male  MRN: [id number]   DOB - 1961-11-06  Admit Date - 04/17/2015  Outpatient Primary MD for the patient is Dr Lovie Macadamia  With History of -  Past Medical History  Diagnosis Date  . Hypertension   . Seizures   . Kidney disease   . Anemia        past surgical history none  Chief Complaint  Patient presents with  . Chest pain     HPI  [name]  is a 54 y.o. male  Presents with increasing CP mid substernal nonradiating without any diaphoresis or shortness of breath. Patient was lifting heavy boxes this morning in in his new condominium and started noticing chest heaviness patient thereafter went to solids restaurant and had spaghetti with some garlic bread. Chest pain got aggravated after eating lunch he had a near-syncopal episode at the cash register and EMS was called at that time7    Review of Systems    In addition to the HPI above No Fever-chills, No Headache, No changes with Vision or hearing, No problems swallowing food or Liquids, No Chest pain, at present, no Cough or Shortness of Breath, No Abdominal pain, No Nausea or Vomitting, Bowel movements are regular No Blood in stool or Urine No dysuria, No new skin rashes or bruises, No new joints pains-aches,  No new weakness, tingling, numbness in any extremity, No recent weight gain or loss, No polyuria, polydypsia or polyphagia, No significant Mental Stressors.  A full 10 point Review of Systems was done, except as stated above, all other Review of Systems were negative.   Social History History  Substance Use Topics  . Smoking status: Never Smoker   . Smokeless tobacco: Not on file  . Alcohol Use: No   Family History Family History  Problem Relation Age of Onset  . Heart attack Father   . Kidney disease Father      Prior to Admission medications   Medication Sig Start Date End Date  Taking? Authorizing Provider  amLODipine (NORVASC) 5 MG tablet Take 5 mg by mouth daily.   Yes Historical Provider, MD  carbamazepine (TEGRETOL) 200 MG tablet Take 200-400 mg by mouth 3 (three) times daily. Pt takes 2 in the morning, 1 in the middle of the day, and 2 at bedtime.   Yes Historical Provider, MD  divalproex (DEPAKOTE) 500 MG DR tablet Take 500-1,000 mg by mouth 3 (three) times daily. Pt takes 2 in the morning, 1 in the middle of the day, and 2 at bedtime.   Yes Historical Provider, MD  Ergocalciferol (VITAMIN D2) 2000 UNITS TABS Take 1 tablet by mouth 2 (two) times daily.    Yes Historical Provider, MD  ferrous sulfate 325 (65 FE) MG tablet Take 325 mg by mouth 2 (two) times daily.   Yes Historical Provider, MD  lisinopril (PRINIVIL,ZESTRIL) 20 MG tablet Take 20 mg by mouth daily.   Yes Historical Provider, MD  propranolol (INDERAL) 20 MG tablet Take 40 mg by mouth 2 (two) times daily.   Yes Historical Provider, MD    No Known Allergies  Physical Exam  Vitals  Blood pressure 115/74, pulse 91, temperature 97.8 F (36.6 C), temperature source Oral, resp. rate 20, height 5\' 3"  (1.6 m), weight 63.504 kg (140 lb), SpO2 99 %.   1. General  lying in bed in NAD Pt is CP free  2. Normal affect and insight, Not Suicidal or  Homicidal, Awake Alert, Oriented X 3.  3. No F.N deficits, ALL C.Nerves Intact, Strength 5/5 all 4 extremities, Sensation intact all 4 extremities, Plantars down going.  4. Ears and Eyes appear Normal, Conjunctivae clear, PERRLA. Moist Oral Mucosa. -poor dentition  5. Supple Neck, No JVD, No cervical lymphadenopathy appriciated, No Carotid Bruits.  6. Symmetrical Chest wall movement, Good air movement bilaterally, CTAB. -no Cp on palpation  7. RRR, No Gallops, Rubs or Murmurs, No Parasternal Heave.  8. Positive Bowel Sounds, Abdomen Soft, No tenderness, No organomegaly appriciated,No rebound -guarding or rigidity.  9.  No Cyanosis, Normal Skin Turgor, No  Skin Rash or Bruise.  10. Good muscle tone,  joints appear normal , no effusions, Normal ROM.  11. No Palpable Lymph Nodes in Neck or Axillae   Lab Data Review  CBC  Recent Labs Lab 04/17/15 1322  WBC 2.6*  HGB 10.3*  HCT 30.1*  PLT 216  MCV 102.0*  MCH 34.7*  MCHC 34.1  RDW 15.6*  LYMPHSABS 1.0  MONOABS 0.1*  EOSABS 0.0  BASOSABS 0.0   ------------------------------------------------------------------------------------------------------------------  Chemistries   Recent Labs Lab 04/17/15 1322  NA 137  K 3.0*  CL 104  CO2 19*  GLUCOSE 171*  BUN 33*  CREATININE 1.37*  CALCIUM 9.9  AST 22  ALT 14*  ALKPHOS 36*  BILITOT 0.4   ------------------------------------------------------------------------------------------------------------------ estimated creatinine clearance is 49 mL/min (by C-G formula based on Cr of 1.37). ------------------------------------------------------------------------------------------------------------------ No results for input(s): TSH, T4TOTAL, T3FREE, THYROIDAB in the last 72 hours.  Invalid input(s): FREET3   Coagulation profile No results for input(s): INR, PROTIME in the last 168 hours. ------------------------------------------------------------------------------------------------------------------- No results for input(s): DDIMER in the last 72 hours. -------------------------------------------------------------------------------------------------------------------  Cardiac Enzymes  Recent Labs Lab 04/17/15 1322  TROPONINI <0.03   ------------------------------------------------------------------------------------------------------------------ Invalid input(s): POCBNP   ---------------------------------------------------------------------------------------------------------------  Urinalysis No results found for: COLORURINE, APPEARANCEUR, LABSPEC, PHURINE, GLUCOSEU, HGBUR, BILIRUBINUR, KETONESUR, PROTEINUR,  UROBILINOGEN, NITRITE, LEUKOCYTESUR  ----------------------------------------------------------------------------------------------------------------  Imaging results:   No results found.  My personal review of EKG: Rhythm NSR, Rate  91 /min, no Acute ST changes    Assessment & Plan   1.Chest pain appears atypical but will r/o MI given strong f/h of MI with CABD in father. ER MD spoke with Dr Josefa Half and recommended to admit pt -CEx3 -ASA, prn nitro,already on propranolol -Myoview in am -EKG no acute changes  2. H/o Seizure d o - cont tegretol and depakote  3. HTN -cont lisinopril, amlodipine  4.CKD-III creat stable  5.DVT Prophylaxis Heparin  Family Communication: Admission, patients condition and plan of care including tests being ordered have been discussed with the patient  who indicate understanding and agree with the plan and Code Status.  Code status Full   Time spent in minutes : 50 mins   Aziz Slape M.D on 04/17/2015 at 4:10 PM  [facility]  Office  [phone]

## 2015-04-17 NOTE — ED Provider Notes (Signed)
[facility] Emergency Department Provider Note    ____________________________________________  Time seen: 1:40 PM  I have reviewed the triage vital signs and the nursing notes.   HISTORY  Chief Complaint Loss of Consciousness    HPI [name] is a 54 y.o. male the past medical history of epilepsy, chronic kidney insufficiency, and hypertension.  Patient states today he was moving between his condo in his car with boxes when he started developing substernal chest pain which she describes her pressure is nonradiating but was associated with a feeling of lightheadedness and sweating.  Patient said that he ignore symptoms slightly and then decided to go to lunch, while at restaurant he said he was able to eat his normal spaghetti and chocolate cake, however went to pain and stood up his chest pain worsened significantly, he became very lightheaded and then nearly passed out. He did fall backwards she did say that he potentially struck his head, but reports no headache no neck pain or otherwise no loss consciousness.  Patient did fall onto his buttock and reports that he has some tenderness over the buttock, but is otherwise feels well. At present he feels his symptoms have improved. He reports no pain at present , but did note is that the pain got worse while walking up and down stairs earlier today..  Patient denies any personal history of cardiac disease, he does have hypertension though.     Past Medical History  Diagnosis Date  . Hypertension   . Seizures   . Kidney disease   . Anemia     There are no active problems to display for this patient.   History reviewed. No pertinent past surgical history.  No current outpatient prescriptions on file.  Allergies Review of patient's allergies indicates no known allergies.  History reviewed. No pertinent family history.  Social History History  Substance Use Topics  . Smoking status: Never  Smoker   . Smokeless tobacco: Not on file  . Alcohol Use: No   Patient's family history is notable for a father who reportedly had coronary vascular disease developed during his 93s but was also heavy smoker.  Review of Systems  Constitutional: Negative for fever. Eyes: Negative for visual changes. ENT: Negative for sore throat. Cardiovascular: See report in HPI Respiratory: Negative for shortness of breath. Gastrointestinal: Negative for abdominal pain, vomiting and diarrhea. Genitourinary: Negative for dysuria. Musculoskeletal: Negative for back pain. Does feel slightly sore across the buttock. Skin: Negative for rash. Neurological: Negative for headaches, focal weakness or numbness. Patient does report that he has tremors at baseline, and this worsens with caffeine. He reports that his current nervous appearance is normal for him.   10-point ROS otherwise negative.  ____________________________________________   PHYSICAL EXAM:  VITAL SIGNS: ED Triage Vitals  Enc Vitals Group     BP 04/17/15 1324 128/73 mmHg     Pulse Rate 04/17/15 1324 92     Resp 04/17/15 1324 22     Temp 04/17/15 1327 97.8 F (36.6 C)     Temp Source 04/17/15 1327 Oral     SpO2 04/17/15 1324 100 %     Weight 04/17/15 1327 140 lb (63.504 kg)     Height 04/17/15 1327 5\' 3"  (1.6 m)     Head Cir --      Peak Flow --      Pain Score 04/17/15 1329 3     Pain Loc --      Pain Edu? --  Excl. in [place]? --      Constitutional: Alert and oriented. Well appearing and in no distress. Patient does feel anxious and slightly tremulous, but patient reports this is normal for him. Eyes: Conjunctivae are normal. PERRL. Normal extraocular movements. ENT   Head: Prominent occiput but atraumatic. Nexus negative neck exam   Nose: No congestion/rhinnorhea.   Mouth/Throat: Mucous membranes are moist.   Neck: No stridor. Hematological/Lymphatic/Immunilogical: No cervical  lymphadenopathy. Cardiovascular: Normal rate, regular rhythm. Normal and symmetric distal pulses are present in all extremities. No murmurs, rubs, or gallops. Respiratory: Normal respiratory effort without tachypnea nor retractions. Breath sounds are clear and equal bilaterally. No wheezes/rales/rhonchi. Gastrointestinal: Soft and nontender. No distention. No abdominal bruits. There is no CVA tenderness. Musculoskeletal: Nontender with normal range of motion in all extremities. No joint effusions.  No lower extremity tenderness nor edema. Neurologic:  Normal speech and language. No gross focal neurologic deficits are appreciated. Speech is normal. No gait instability. Skin:  Skin is warm, dry and intact. No rash noted. Psychiatric: Mood and affect are normal. Speech and behavior are normal. Patient exhibits appropriate insight and judgment.  ____________________________________________   EKG  EKG demonstrates heart rate of 91 normal sinus rhythm PR interval 150 QRS 86 QTc 432 T waves notable for nonspecific T-wave abnormality. There is no ST elevation noted, however based upon this EKG I cannot rule out cardiac ischemia.  ____________________________________________    RADIOLOGY    ____________________________________________   PROCEDURES  Procedure(s) performed: None  Critical Care performed: No  ____________________________________________   INITIAL IMPRESSION / ASSESSMENT AND PLAN / ED COURSE  Pertinent labs & imaging results that were available during my care of the patient were reviewed by me and considered in my medical decision making (see chart for details).  Patient presents with a syncopal episode or near syncope following an episode of chest pain starting this morning with exertion. Patient currently reports his pain and symptoms have resolved, but his history of diseases concerning for potential coronary artery syndrome. She does have a history of hypertension on  multiple antihypertensive medications, and a family history of coronary disease. At the present time it does not appear to sustain a significant injury from his fall and did not in fact have true syncope, my primary concern at this point would be assuring the patient is not having coronary disease.  Workup including cardiac evaluation chest x-ray is warranted. At present time patient meets no criteria for needing head CT.  ----------------------------------------- 3:31 PM on 04/17/2015 -----------------------------------------  Patient reports that he is still asymptomatic at this time. Discussed with cardiology Dr. Josefa Half shows an given the patient's moderate risk heart score of 4, appears reasonable to admit the patient for overnight observation and further cardiac workup.  ____________________________________________   FINAL CLINICAL IMPRESSION(S) / ED DIAGNOSES  Final diagnoses:  Chest pain with moderate risk for cardiac etiology     Delman Kitten, MD 04/17/15 1621

## 2015-04-18 ENCOUNTER — Observation Stay: Payer: Self-pay

## 2015-04-18 ENCOUNTER — Encounter: Payer: Self-pay | Admitting: Radiology

## 2015-04-18 ENCOUNTER — Ambulatory Visit: Payer: Self-pay

## 2015-04-18 MED ORDER — LISINOPRIL 20 MG PO TABS: 20.0000 mg | ORAL_TABLET | Freq: Every day | ORAL | Status: DC

## 2015-04-18 MED ORDER — AMLODIPINE BESYLATE 5 MG PO TABS: 5.0000 mg | ORAL_TABLET | Freq: Every day | ORAL | Status: DC

## 2015-04-18 MED ORDER — TECHNETIUM TC 99M SESTAMIBI GENERIC - CARDIOLITE
30.0000 | Freq: Once | INTRAVENOUS | Status: AC | PRN
  Administered 2015-04-18: 32.72 via INTRAVENOUS

## 2015-04-18 MED ORDER — TECHNETIUM TC 99M SESTAMIBI - CARDIOLITE
13.3560 | Freq: Once | INTRAVENOUS | Status: AC | PRN
  Administered 2015-04-18: 13.356 via INTRAVENOUS

## 2015-04-18 MED ORDER — REGADENOSON 0.4 MG/5ML IV SOLN
0.4000 mg | Freq: Once | INTRAVENOUS | Status: AC
  Administered 2015-04-18: 0.4 mg via INTRAVENOUS

## 2015-04-18 NOTE — Discharge Instructions (Signed)

## 2015-04-18 NOTE — Progress Notes (Signed)
Alert and oriented.  Sinus Rhythm on monitor.  Denies pain and appears to be in no discomfort.  No shortness of breath noted. Currently NPO for Myoview later this morning.  In no apparent distress at this time.

## 2015-04-18 NOTE — Discharge Summary (Signed)
[name] at [facility], is a 54 y.o. male  DOB 29-Jul-1961  MRN [id number].  Admission date:  04/17/2015  Admitting Physician  Fritzi Mandes, MD  Discharge Date:  04/18/2015   Primary MD  Dr Reinaldo Raddle   Admission Diagnosis  Chest pain [R07.9] Chest pain with moderate risk for cardiac etiology [R07.9]   Discharge Diagnosis  Chest Pain Non- Cardiac  Principal Problem:   Chest pain      Past Medical History  Diagnosis Date  . Hypertension   . Seizures   . Kidney disease   . Anemia         Reason for Admission:     54 YO male presents with sudden onset chest pain. Remembers falling at restaurant just before. C/O chest pain and his tail bone hurting. Admitted for further work up. See admitting H&P for details.  Hospital Course    1. Chest Pain: Pt admitted, ruled out. Because of risk factors for CAD pt was set up for myoview. Results show no signs of ischemia. Chest pain was thought to be non cardiac and related to chest wall pain.  2. Lower Back Pain: Secondary to fall. Easely controlled with tylenol. No further work up. F/U with PCP  3. Seizure Disorder: Continued home meds. No evidence of any seizure activity during hospital stay.  4. HTN: Controlled with home meds.   Discharge Condition: Stable   Follow UP  Follow-up Information    Follow up with [name], MD In 2 weeks.   Specialty:  Family Medicine   Contact information:   [address] [address] [phone]         Discharge Instructions  and  Discharge Medications     Discharge Instructions    Diet general    Complete by:  As directed      Increase activity slowly    Complete by:  As directed             Medication List    TAKE these medications        amLODipine 5 MG tablet  Commonly known as:  NORVASC  Take 5 mg by mouth daily.      carbamazepine 200 MG tablet  Commonly known as:  TEGRETOL  Take 200-400 mg by mouth 3 (three) times daily. Pt takes 2 in the morning, 1 in the middle of the day, and 2 at bedtime.     divalproex 500 MG DR tablet  Commonly known as:  DEPAKOTE  Take 500-1,000 mg by mouth 3 (three) times daily. Pt takes 2 in the morning, 1 in the middle of the day, and 2 at bedtime.     ferrous sulfate 325 (65 FE) MG tablet  Take 325 mg by mouth 2 (two) times daily.     lisinopril 20 MG tablet  Commonly known as:  PRINIVIL,ZESTRIL  Take 20 mg by mouth daily.     propranolol 20 MG tablet  Commonly  known as:  INDERAL  Take 40 mg by mouth 2 (two) times daily.     Vitamin D2 2000 UNITS Tabs  Take 1 tablet by mouth 2 (two) times daily.          Diet and Activity recommendation: See Discharge Instructions above   Consults obtained - None  Major procedures and Radiology Reports - PLEASE review detailed and final reports for all details, in brief -      Nm Myocar Multi W/spect W/wall Motion / Ef  04/18/2015   Teodoro Spray, MD     04/18/2015  1:32 PM Lexiscan sestimibi study done this am. LV function noted with no  ischemia. Full report formally in Epic when functionality  available.      Today   Subjective:   [name] today has no chest abdominal pain  Objective:   Blood pressure 134/78, pulse 68, temperature 98 F (36.7 C), temperature source Oral, resp. rate 18, height 5\' 3"  (1.6 m), weight 66.769 kg (147 lb 3.2 oz), SpO2 100 %.   Intake/Output Summary (Last 24 hours) at 04/18/15 1403 Last data filed at 04/18/15 1403  Gross per 24 hour  Intake      0 ml  Output   2300 ml  Net  -2300 ml    Exam Awake Alert, Oriented x 3, Supple Neck,No JVD, No cervical lymphadenopathy appriciated.  Symmetrical Chest wall movement, Good air movement bilaterally,  RRR,No Gallops,Rubs or new Murmurs, Data Review   CBC w Diff: Lab Results  Component Value Date   WBC 2.6* 04/17/2015    WBC 3.8 03/10/2015   HGB 10.3* 04/17/2015   HGB 9.0* 03/10/2015   HCT 30.1* 04/17/2015   HCT 27.1* 03/10/2015   PLT 216 04/17/2015   PLT 148* 03/10/2015   LYMPHOPCT 40% 04/17/2015   LYMPHOPCT 38.6 10/06/2014   MONOPCT 5% 04/17/2015   MONOPCT 10.6 10/06/2014   EOSPCT 1% 04/17/2015   EOSPCT 4.4 10/06/2014   BASOPCT 0% 04/17/2015   BASOPCT 0.7 10/06/2014    CMP: Lab Results  Component Value Date   NA 137 04/17/2015   NA 131* 03/10/2015   K 3.0* 04/17/2015   K 4.5 03/10/2015   CL 104 04/17/2015   CL 96* 03/10/2015   CO2 19* 04/17/2015   CO2 26 03/10/2015   BUN 33* 04/17/2015   BUN 7 03/10/2015   CREATININE 1.37* 04/17/2015   CREATININE 1.19 03/10/2015   PROT 6.8 04/17/2015   PROT 6.8 10/06/2014   ALBUMIN 4.0 04/17/2015   ALBUMIN 3.6 10/06/2014   BILITOT 0.4 04/17/2015   ALKPHOS 36* 04/17/2015   ALKPHOS 50 10/06/2014   AST 22 04/17/2015   AST 19 10/06/2014   ALT 14* 04/17/2015   ALT 17 10/06/2014  .   Total Time in preparing paper work, data evaluation and todays exam - 35 minutes  Dana Allan.D on 04/18/2015 at 2:03 PM  [place] [facility] [phone]

## 2015-04-21 LAB — NM MYOCAR MULTI W/SPECT W/WALL MOTION / EF
CHL CUP STRESS STAGE 1 GRADE: 0 %
CHL CUP STRESS STAGE 1 SPEED: 0 mph
CHL CUP STRESS STAGE 2 HR: 62 {beats}/min
CHL CUP STRESS STAGE 3 SPEED: 0 mph
CHL CUP STRESS STAGE 4 GRADE: 0 %
CSEPPMHR: 56 %
Estimated workload: 1 METS
Peak HR: 93 {beats}/min
Stage 1 HR: 62 {beats}/min
Stage 2 Grade: 0 %
Stage 2 Speed: 0 mph
Stage 3 Grade: 0 %
Stage 3 HR: 93 {beats}/min
Stage 4 HR: 88 {beats}/min
Stage 4 Speed: 0 mph

## 2015-05-19 DIAGNOSIS — N183 Chronic kidney disease, stage 3 unspecified: Secondary | ICD-10-CM | POA: Insufficient documentation

## 2015-05-19 DIAGNOSIS — D649 Anemia, unspecified: Secondary | ICD-10-CM | POA: Insufficient documentation

## 2015-05-19 DIAGNOSIS — G40209 Localization-related (focal) (partial) symptomatic epilepsy and epileptic syndromes with complex partial seizures, not intractable, without status epilepticus: Secondary | ICD-10-CM | POA: Insufficient documentation

## 2015-05-30 ENCOUNTER — Ambulatory Visit: Payer: Self-pay | Admitting: Cardiovascular Disease

## 2015-07-15 ENCOUNTER — Encounter: Payer: Self-pay | Admitting: Cardiovascular Disease

## 2015-07-15 ENCOUNTER — Ambulatory Visit (INDEPENDENT_AMBULATORY_CARE_PROVIDER_SITE_OTHER): Payer: Self-pay | Admitting: Cardiovascular Disease

## 2015-07-15 ENCOUNTER — Encounter (INDEPENDENT_AMBULATORY_CARE_PROVIDER_SITE_OTHER): Payer: Self-pay

## 2015-07-15 VITALS — BP 160/70 | HR 63 | Ht 63.0 in | Wt 147.5 lb

## 2015-07-15 DIAGNOSIS — I10 Essential (primary) hypertension: Secondary | ICD-10-CM | POA: Insufficient documentation

## 2015-07-15 DIAGNOSIS — N182 Chronic kidney disease, stage 2 (mild): Secondary | ICD-10-CM

## 2015-07-15 DIAGNOSIS — N189 Chronic kidney disease, unspecified: Secondary | ICD-10-CM | POA: Insufficient documentation

## 2015-07-15 DIAGNOSIS — M7989 Other specified soft tissue disorders: Secondary | ICD-10-CM

## 2015-07-15 DIAGNOSIS — R002 Palpitations: Secondary | ICD-10-CM

## 2015-07-15 DIAGNOSIS — G250 Essential tremor: Secondary | ICD-10-CM | POA: Insufficient documentation

## 2015-07-15 DIAGNOSIS — R079 Chest pain, unspecified: Secondary | ICD-10-CM

## 2015-07-15 NOTE — Assessment & Plan Note (Signed)
Currently on propranolol 20 mg twice a day for tremor

## 2015-07-15 NOTE — Patient Instructions (Signed)
You are doing well.  Please restart lisinopril  Monitor your blood pressure at home  Continue propranolol twice a day  Please call us if you have new issues that need to be addressed before your next appt.

## 2015-07-15 NOTE — Assessment & Plan Note (Signed)
Followed by Dr. Lateef. 

## 2015-07-15 NOTE — Assessment & Plan Note (Signed)
Blood pressure is high. Recommended he restart lisinopril 20 mg daily We'll probably avoid amlodipine given his leg swelling from venous insufficiency If blood pressure continues to run high, could consider alternate blood pressure or higher dose lisinopril Recommended follow-up with primary care

## 2015-07-15 NOTE — Progress Notes (Signed)
Patient ID: [name], male    DOB: [birth year], 54 y.o.   MRN: [id number]  HPI Comments: [name] is a 54 year old gentleman with hypertension, seizure disorder, essential tremor, patient of Dr. Holley Raring and Lovie Macadamia who presents for follow-up after recent hospitalization for chest pain. Notes indicate irregular heart rhythm.  In follow-up today, [name] is taking propranolol 20 mg twice a day for tremor [name] stopped taking amlodipine and lisinopril after recent hospitalization. Reports that [name] felt his blood pressure was running too low and that [name] almost passed out while in the grocery store. This led to hospitalization, rule out for MI, stress test that showed no ischemia.  In general [name] feels well, is generally very weak. Denies any symptoms concerning for angina.  Strong family history of coronary artery disease but father was a heavy smoker, 3 packs per day.  EKG on today's visit shows normal sinus rhythm with rate 63 bpm, nonspecific ST abnormality   No Known Allergies  Current Outpatient Prescriptions on File Prior to Visit  Medication Sig Dispense Refill  . carbamazepine (TEGRETOL) 200 MG tablet Take 200-400 mg by mouth 3 (three) times daily. Pt takes 2 in the morning, 1 in the middle of the day, and 2 at bedtime.    . divalproex (DEPAKOTE) 500 MG DR tablet Take 500-1,000 mg by mouth 3 (three) times daily. Pt takes 2 in the morning, 1 in the middle of the day, and 2 at bedtime.    . Ergocalciferol (VITAMIN D2) 2000 UNITS TABS Take 1 tablet by mouth 2 (two) times daily.     . ferrous sulfate 325 (65 FE) MG tablet Take 325 mg by mouth 2 (two) times daily.    . propranolol (INDERAL) 20 MG tablet Take 40 mg by mouth 2 (two) times daily.    Marland Kitchen amLODipine (NORVASC) 5 MG tablet Take 5 mg by mouth daily.    Marland Kitchen lisinopril (PRINIVIL,ZESTRIL) 20 MG tablet Take 20 mg by mouth daily.     No current facility-administered medications on file prior to visit.    Past Medical History  Diagnosis  Date  . Hypertension   . Seizures   . Kidney disease   . Anemia   . Migraine     History reviewed. No pertinent past surgical history.  Social History  reports that [name] has never smoked. [name] does not have any smokeless tobacco history on file. [name] reports that [name] does not drink alcohol or use illicit drugs.  Family History family history includes Heart attack in his father; Kidney disease in his father.      Review of Systems  Constitutional: Negative.   HENT: Negative.   Eyes: Negative.   Respiratory: Negative.   Cardiovascular: Negative.   Gastrointestinal: Negative.   Endocrine: Negative.   Musculoskeletal: Positive for gait problem.  Skin: Negative.   Allergic/Immunologic: Negative.   Neurological: Positive for tremors.  Hematological: Negative.   Psychiatric/Behavioral: Negative.   All other systems reviewed and are negative.   BP 160/70 mmHg  Pulse 63  Ht 5\' 3"  (1.6 m)  Wt 147 lb 8 oz (66.906 kg)  BMI 26.14 kg/m2 Repeat blood pressure with systolic in the 892 range Physical Exam  Constitutional: [name] is oriented to person, place, and time. [name] appears well-developed and well-nourished.  HENT:  Head: Normocephalic.  Nose: Nose normal.  Mouth/Throat: Oropharynx is clear and moist.  Eyes: Conjunctivae are normal. Pupils are equal, round, and reactive to light.  Neck: Normal range of motion. Neck  supple. No JVD present.  Cardiovascular: Normal rate, regular rhythm, S1 normal, S2 normal, normal heart sounds and intact distal pulses.  Exam reveals no gallop and no friction rub.   No murmur heard. Pulmonary/Chest: Effort normal and breath sounds normal. No respiratory distress. [name] has no wheezes. [name] has no rales. [name] exhibits no tenderness.  Abdominal: Soft. Bowel sounds are normal. [name] exhibits no distension. There is no tenderness.  Musculoskeletal: Normal range of motion. [name] exhibits no edema or tenderness.  Lymphadenopathy:    [name] has no cervical adenopathy.   Neurological: [name] is alert and oriented to person, place, and time. Coordination normal.  Skin: Skin is warm and dry. No rash noted. No erythema.  Psychiatric: [name] has a normal mood and affect. His behavior is normal. Judgment and thought content normal.      Assessment and Plan   Nursing note and vitals reviewed.

## 2015-07-15 NOTE — Assessment & Plan Note (Signed)
No further chest  Pain. Recent negative stress test.

## 2015-07-15 NOTE — Assessment & Plan Note (Signed)
Venous insufficieny. Recommended compression hose.

## 2015-07-28 ENCOUNTER — Other Ambulatory Visit
Admission: RE | Admit: 2015-07-28 | Discharge: 2015-07-28 | Disposition: A | Discharge status: Home / Self Care | Payer: Self-pay | Source: Ambulatory Visit | Attending: Neurology | Admitting: Neurology

## 2015-07-28 DIAGNOSIS — G40309 Generalized idiopathic epilepsy and epileptic syndromes, not intractable, without status epilepticus: Secondary | ICD-10-CM | POA: Insufficient documentation

## 2015-07-28 DIAGNOSIS — Z5181 Encounter for therapeutic drug level monitoring: Secondary | ICD-10-CM | POA: Insufficient documentation

## 2015-07-28 LAB — COMPREHENSIVE METABOLIC PANEL
ALBUMIN: 3.9 g/dL (ref 3.5–5.0)
ALT: 10 U/L — ABNORMAL LOW (ref 17–63)
ANION GAP: 9 (ref 5–15)
AST: 16 U/L (ref 15–41)
Alkaline Phosphatase: 42 U/L (ref 38–126)
BUN: 22 mg/dL — AB (ref 6–20)
CO2: 26 mmol/L (ref 22–32)
Calcium: 9 mg/dL (ref 8.9–10.3)
Chloride: 90 mmol/L — ABNORMAL LOW (ref 101–111)
Creatinine, Ser: 1.35 mg/dL — ABNORMAL HIGH (ref 0.61–1.24)
GFR calc Af Amer: >60 mL/min (ref >60)
GFR calc non Af Amer: 57 mL/min — ABNORMAL LOW (ref >60)
Glucose, Bld: 89 mg/dL (ref 65–99)
Potassium: 4.4 mmol/L (ref 3.5–5.1)
Sodium: 125 mmol/L — ABNORMAL LOW (ref 135–145)
TOTAL PROTEIN: 6.7 g/dL (ref 6.5–8.1)
Total Bilirubin: 0.3 mg/dL (ref 0.3–1.2)

## 2015-07-28 LAB — CBC
HCT: 30.9 % — ABNORMAL LOW (ref 40.0–52.0)
Hemoglobin: 10.6 g/dL — ABNORMAL LOW (ref 13.0–18.0)
MCH: 33.6 pg (ref 26.0–34.0)
MCHC: 34.2 g/dL (ref 32.0–36.0)
MCV: 98.1 fL (ref 80.0–100.0)
PLATELETS: 117 10*3/uL — AB (ref 150–440)
RBC: 3.14 MIL/uL — ABNORMAL LOW (ref 4.40–5.90)
RDW: 14.6 % — AB (ref 11.5–14.5)
WBC: 3.7 10*3/uL — ABNORMAL LOW (ref 3.8–10.6)

## 2015-07-28 LAB — CARBAMAZEPINE LEVEL, TOTAL: Carbamazepine Lvl: 7.1 ug/mL (ref 4.0–12.0)

## 2015-07-28 LAB — AMMONIA

## 2015-07-28 LAB — TSH: TSH: 2.557 u[IU]/mL (ref 0.350–4.500)

## 2015-07-28 LAB — VALPROIC ACID LEVEL: Valproic Acid Lvl: 53 ug/mL (ref 50.0–100.0)

## 2015-09-05 ENCOUNTER — Emergency Department: Payer: Self-pay

## 2015-09-05 ENCOUNTER — Inpatient Hospital Stay
Admission: EM | Admit: 2015-09-05 | Discharge: 2015-09-07 | DRG: 641 | Disposition: A | Discharge status: Home Health | Payer: Self-pay | Attending: Internal Medicine | Admitting: Internal Medicine

## 2015-09-05 ENCOUNTER — Encounter: Payer: Self-pay | Admitting: *Deleted

## 2015-09-05 DIAGNOSIS — H9190 Unspecified hearing loss, unspecified ear: Secondary | ICD-10-CM | POA: Diagnosis present

## 2015-09-05 DIAGNOSIS — D649 Anemia, unspecified: Secondary | ICD-10-CM | POA: Diagnosis present

## 2015-09-05 DIAGNOSIS — N182 Chronic kidney disease, stage 2 (mild): Secondary | ICD-10-CM | POA: Diagnosis present

## 2015-09-05 DIAGNOSIS — Z79899 Other long term (current) drug therapy: Secondary | ICD-10-CM

## 2015-09-05 DIAGNOSIS — G250 Essential tremor: Secondary | ICD-10-CM | POA: Diagnosis present

## 2015-09-05 DIAGNOSIS — E871 Hypo-osmolality and hyponatremia: Principal | ICD-10-CM | POA: Diagnosis present

## 2015-09-05 DIAGNOSIS — E46 Unspecified protein-calorie malnutrition: Secondary | ICD-10-CM | POA: Diagnosis present

## 2015-09-05 DIAGNOSIS — G40909 Epilepsy, unspecified, not intractable, without status epilepticus: Secondary | ICD-10-CM | POA: Diagnosis present

## 2015-09-05 DIAGNOSIS — Z6824 Body mass index (BMI) 24.0-24.9, adult: Secondary | ICD-10-CM

## 2015-09-05 DIAGNOSIS — R7989 Other specified abnormal findings of blood chemistry: Secondary | ICD-10-CM

## 2015-09-05 DIAGNOSIS — N289 Disorder of kidney and ureter, unspecified: Secondary | ICD-10-CM

## 2015-09-05 DIAGNOSIS — R778 Other specified abnormalities of plasma proteins: Secondary | ICD-10-CM

## 2015-09-05 DIAGNOSIS — E876 Hypokalemia: Secondary | ICD-10-CM

## 2015-09-05 DIAGNOSIS — I129 Hypertensive chronic kidney disease with stage 1 through stage 4 chronic kidney disease, or unspecified chronic kidney disease: Secondary | ICD-10-CM | POA: Diagnosis present

## 2015-09-05 DIAGNOSIS — Z8249 Family history of ischemic heart disease and other diseases of the circulatory system: Secondary | ICD-10-CM

## 2015-09-05 DIAGNOSIS — I248 Other forms of acute ischemic heart disease: Secondary | ICD-10-CM | POA: Diagnosis present

## 2015-09-05 DIAGNOSIS — D72819 Decreased white blood cell count, unspecified: Secondary | ICD-10-CM

## 2015-09-05 DIAGNOSIS — E43 Unspecified severe protein-calorie malnutrition: Secondary | ICD-10-CM | POA: Insufficient documentation

## 2015-09-05 DIAGNOSIS — E860 Dehydration: Secondary | ICD-10-CM | POA: Diagnosis present

## 2015-09-05 LAB — URINALYSIS COMPLETE WITH MICROSCOPIC (ARMC ONLY)
Bacteria, UA: NONE SEEN
Bilirubin Urine: NEGATIVE
Glucose, UA: NEGATIVE mg/dL
Leukocytes, UA: NEGATIVE
Nitrite: NEGATIVE
PH: 6 (ref 5.0–8.0)
PROTEIN: NEGATIVE mg/dL
Specific Gravity, Urine: 1.009 (ref 1.005–1.030)

## 2015-09-05 LAB — CBC WITH DIFFERENTIAL/PLATELET
BASOS ABS: 0 10*3/uL (ref 0–0.1)
BASOS PCT: 1 %
Eosinophils Absolute: 0 10*3/uL (ref 0–0.7)
Eosinophils Relative: 0 %
HEMATOCRIT: 34.6 % — AB (ref 40.0–52.0)
HEMOGLOBIN: 12.1 g/dL — AB (ref 13.0–18.0)
LYMPHS PCT: 25 %
Lymphs Abs: 0.9 10*3/uL — ABNORMAL LOW (ref 1.0–3.6)
MCH: 32.6 pg (ref 26.0–34.0)
MCHC: 34.9 g/dL (ref 32.0–36.0)
MCV: 93.5 fL (ref 80.0–100.0)
MONO ABS: 0.3 10*3/uL (ref 0.2–1.0)
Monocytes Relative: 9 %
NEUTROS ABS: 2.4 10*3/uL (ref 1.4–6.5)
NEUTROS PCT: 65 %
Platelets: 191 10*3/uL (ref 150–440)
RBC: 3.7 MIL/uL — AB (ref 4.40–5.90)
RDW: 13.6 % (ref 11.5–14.5)
WBC: 3.7 10*3/uL — ABNORMAL LOW (ref 3.8–10.6)

## 2015-09-05 LAB — BLOOD GAS, VENOUS
ACID-BASE EXCESS: 2.8 mmol/L (ref 0.0–3.0)
BICARBONATE: 23.4 meq/L (ref 21.0–28.0)
FIO2: 0.21
PCO2 VEN: 25 mmHg — AB (ref 44.0–60.0)
PH VEN: 7.58 — AB (ref 7.320–7.430)
Patient temperature: 37

## 2015-09-05 LAB — BASIC METABOLIC PANEL
Anion gap: 20 — ABNORMAL HIGH (ref 5–15)
BUN: 18 mg/dL (ref 6–20)
CALCIUM: 9.8 mg/dL (ref 8.9–10.3)
CHLORIDE: 76 mmol/L — AB (ref 101–111)
CO2: 22 mmol/L (ref 22–32)
CREATININE: 1.33 mg/dL — AB (ref 0.61–1.24)
GFR calc Af Amer: >60 mL/min (ref >60)
GFR calc non Af Amer: 58 mL/min — ABNORMAL LOW (ref >60)
Glucose, Bld: 91 mg/dL (ref 65–99)
Potassium: 2.8 mmol/L — CL (ref 3.5–5.1)
SODIUM: 118 mmol/L — AB (ref 135–145)

## 2015-09-05 LAB — ETHANOL

## 2015-09-05 LAB — LACTIC ACID, PLASMA: Lactic Acid, Venous: 1.3 mmol/L (ref 0.5–2.0)

## 2015-09-05 LAB — MAGNESIUM: Magnesium: 1.2 mg/dL — ABNORMAL LOW (ref 1.7–2.4)

## 2015-09-05 LAB — TROPONIN I
TROPONIN I: 0.04 ng/mL — AB (ref <0.031)
Troponin I: 0.06 ng/mL — ABNORMAL HIGH (ref <0.031)

## 2015-09-05 MED ORDER — POTASSIUM CHLORIDE 10 MEQ/100ML IV SOLN
10.0000 meq | Freq: Once | INTRAVENOUS | Status: AC
  Administered 2015-09-05: 10 meq via INTRAVENOUS
  Filled 2015-09-05: qty 100

## 2015-09-05 MED ORDER — MAGNESIUM SULFATE 4 GM/100ML IV SOLN
4.0000 g | Freq: Two times a day (BID) | INTRAVENOUS | Status: AC
  Administered 2015-09-06 (×2): 4 g via INTRAVENOUS
  Filled 2015-09-05 (×2): qty 100

## 2015-09-05 MED ORDER — SODIUM CHLORIDE 0.9 % IV SOLN
Freq: Once | INTRAVENOUS | Status: AC
  Administered 2015-09-05: 17:00:00 via INTRAVENOUS

## 2015-09-05 MED ORDER — ASPIRIN 81 MG PO CHEW
324.0000 mg | CHEWABLE_TABLET | Freq: Once | ORAL | Status: AC
  Administered 2015-09-05: 324 mg via ORAL
  Filled 2015-09-05: qty 4

## 2015-09-05 MED ORDER — AMLODIPINE BESYLATE 5 MG PO TABS
5.0000 mg | ORAL_TABLET | Freq: Every day | ORAL | Status: DC
  Administered 2015-09-06 – 2015-09-07 (×3): 5 mg via ORAL
  Filled 2015-09-05 (×3): qty 1

## 2015-09-05 MED ORDER — NITROGLYCERIN 2 % TD OINT
0.5000 [in_us] | TOPICAL_OINTMENT | Freq: Four times a day (QID) | TRANSDERMAL | Status: DC
  Administered 2015-09-06 – 2015-09-07 (×6): 0.5 [in_us] via TOPICAL
  Filled 2015-09-05 (×6): qty 1

## 2015-09-05 MED ORDER — PANTOPRAZOLE SODIUM 40 MG PO TBEC
40.0000 mg | DELAYED_RELEASE_TABLET | Freq: Every day | ORAL | Status: DC
  Administered 2015-09-06 – 2015-09-07 (×3): 40 mg via ORAL
  Filled 2015-09-05 (×3): qty 1

## 2015-09-05 MED ORDER — POTASSIUM CHLORIDE CRYS ER 20 MEQ PO TBCR
40.0000 meq | EXTENDED_RELEASE_TABLET | Freq: Once | ORAL | Status: AC
  Administered 2015-09-05: 40 meq via ORAL
  Filled 2015-09-05: qty 2

## 2015-09-05 MED ORDER — ASPIRIN 81 MG PO CHEW
81.0000 mg | CHEWABLE_TABLET | Freq: Every day | ORAL | Status: DC
  Administered 2015-09-06 – 2015-09-07 (×2): 81 mg via ORAL
  Filled 2015-09-05 (×2): qty 1

## 2015-09-05 NOTE — ED Notes (Signed)
Pt. Gave permission for friend Loney Laurence to receive information about him. [phone]

## 2015-09-05 NOTE — ED Notes (Signed)
MD [place] at bedside.

## 2015-09-05 NOTE — ED Notes (Signed)
Patient returned from X-ray 

## 2015-09-05 NOTE — ED Notes (Addendum)
Pharmacy called regarding potassium, will send to ED.

## 2015-09-05 NOTE — ED Notes (Signed)
Pt made aware for need to urinate, given urinal at this time, states "i cant go right now" pt instructed to let RN know once able to go. Pt verbalized understanding at this time.

## 2015-09-05 NOTE — ED Notes (Signed)
MD Lord at bedside. 

## 2015-09-05 NOTE — ED Notes (Signed)
Pt to ED from home via EMS with generalized weakness, pt states "I think I am dehydrated." Pt is AAOx3 at this time, vitals wnl. Pt states currently feels lightheaded, dizziness, denies SOB or chest pain. Pt states hx of "kidney problems and epilepsy"

## 2015-09-05 NOTE — ED Notes (Signed)
Patient transported to X-ray 

## 2015-09-05 NOTE — ED Provider Notes (Signed)
[facility] Emergency Department Provider Note   ____________________________________________  Time seen: 3:30 PM I have reviewed the triage vital signs and the triage nursing note.  HISTORY  Chief Complaint Weakness   Historian Patient, limited historian due to hard of hearing, and poor historian  HPI [name] is a 54 y.o. male with a history of seizures, hypertension, essential tremor, and hypertension, who is presenting for generalized weakness since Thursday. Patient states on Thursday due to generalized weakness he did fall and land on his left knee left knuckles and possibly struck his head.He feels like he might be dehydrated. There has been intermittent cough, but no sputum production. No shortness of breath. No chest pain. No fever. Decreased urine output. No dull pain, vomiting, or diarrhea.    Past Medical History  Diagnosis Date  . Hypertension   . Seizures   . Kidney disease   . Anemia   . Migraine     Patient Active Problem List   Diagnosis Date Noted  . Tremor, essential 07/15/2015  . Essential hypertension 07/15/2015  . Chronic renal insufficiency 07/15/2015  . Leg swelling 07/15/2015  . Chest pain 04/17/2015    History reviewed. No pertinent past surgical history.  Current Outpatient Rx  Name  Route  Sig  Dispense  Refill  . amLODipine (NORVASC) 5 MG tablet   Oral   Take 5 mg by mouth daily.         . butalbital-acetaminophen-caffeine (FIORICET, ESGIC) 50-325-40 MG per tablet   Oral   Take 1-2 tablets by mouth every 4 (four) hours as needed for migraine.         . carbamazepine (TEGRETOL) 200 MG tablet   Oral   Take 200-400 mg by mouth 3 (three) times daily. Pt takes two in the morning, one at noon, and two at bedtime.         . divalproex (DEPAKOTE) 500 MG DR tablet   Oral   Take 500-1,000 mg by mouth 3 (three) times daily. Pt takes two in the morning, one at noon, and two at bedtime.         Marland Kitchen lisinopril  (PRINIVIL,ZESTRIL) 20 MG tablet   Oral   Take 20 mg by mouth daily.         . propranolol (INDERAL) 20 MG tablet   Oral   Take 40 mg by mouth 2 (two) times daily.           Allergies Review of patient's allergies indicates no known allergies.  Family History  Problem Relation Age of Onset  . Heart attack Father   . Kidney disease Father     Social History Social History  Substance Use Topics  . Smoking status: Never Smoker   . Smokeless tobacco: None  . Alcohol Use: No    Review of Systems  Constitutional: Negative for fever. Eyes: Negative for visual changes. ENT: Negative for sore throat. Cardiovascular: Negative for chest pain. Respiratory: Negative for shortness of breath. Gastrointestinal: Negative for abdominal pain, vomiting and diarrhea. Genitourinary: Negative for dysuria. Musculoskeletal: Negative for back pain. Skin: Negative for rash. Neurological: Negative for headache. 10 point Review of Systems otherwise negative ____________________________________________   PHYSICAL EXAM:  VITAL SIGNS: ED Triage Vitals  Enc Vitals Group     BP 09/05/15 1534 174/96 mmHg     Pulse Rate 09/05/15 1534 95     Resp 09/05/15 1534 14     Temp 09/05/15 1534 98.7 F (37.1 C)  Temp Source 09/05/15 1534 Oral     SpO2 09/05/15 1534 100 %     Weight 09/05/15 1534 135 lb (61.236 kg)     Height 09/05/15 1534 5\' 3"  (1.6 m)     Head Cir --      Peak Flow --      Pain Score --      Pain Loc --      Pain Edu? --      Excl. in [place]? --      Constitutional: Alert and oriented. Hard of hearing. Slightly unkept, in no distress. Eyes: Conjunctivae are normal. PERRL. Normal extraocular movements. ENT   Head: Normocephalic. Slight small and old ecchymosis to the left forehead/scalp.   Nose: No congestion/rhinnorhea.   Mouth/Throat: Mucous membranes are moderately dry. Poor dentition..   Neck: No stridor. No midline C-spine tenderness on palpation or range  of motion. Cardiovascular/Chest: Normal rate, regular rhythm.  No murmurs, rubs, or gallops. Respiratory: Normal respiratory effort without tachypnea nor retractions. Breath sounds are clear and equal bilaterally. No wheezes/rales/rhonchi. Gastrointestinal: Soft. No distention, no guarding, no rebound. Nontender   Genitourinary/rectal:Deferred Musculoskeletal: Nontender with normal range of motion in all extremities. No joint effusions.  No lower extremity tenderness.  No edema. Neurologic:  Hard of hearing. Essential tremor both arms. No focal weakness or numbness, but mild generalized weakness and for extremity. No dysarthria, or aphasia. No facial droop. Skin:  Skin is warm, dry and intact. No rash noted. A few scabbed over wounds over the knuckles of the left third fourth and fifth digits. Psychiatric: Slightly flat affect. No psychosis..  ____________________________________________   EKG I, Lisa Roca, MD, the attending physician have personally viewed and interpreted all ECGs.  80 bpm. Normal sinus rhythm. Narrow QRS. Normal axis. Nonspecific T wave.  ____________________________________________  LABS (pertinent positives/negatives)  Sodium 118 Potassium 2.6 Chloride 76 Creatinine 1.33 Troponin 0.04 White blood cell count 3.7, hemoglobin 12.1, platelet count 191  ____________________________________________  RADIOLOGY All Xrays were viewed by me. Imaging interpreted by Radiologist.  Chest x-ray two-view: Negative  CT head noncontrast: IMPRESSION: 1. No acute intracranial abnormality. 2. Moderate generalized atrophy. 3. Mild chronic right frontal sinus disease. Possible acute mild left maxillary sinus disease. __________________________________________  PROCEDURES  Procedure(s) performed: None  Critical Care performed: None  ____________________________________________   ED COURSE / ASSESSMENT AND PLAN  CONSULTATIONS: Face-to-face with hospitalist for  admission  Pertinent labs & imaging results that were available during my care of the patient were reviewed by me and considered in my medical decision making (see chart for details).  Patient clinically dehydrated on exam, with some possible confusion versus just hard of hearing. Patient's head CT showed no acute brain abnormality finding.  Laboratory evaluation did show acute on chronic hyponatremia to 118, 1 L normal saline bolus was initiated. Patient was given by mouth and IV repletion for potassium. Patient was given aspirin for minimally elevated troponin. EKG is nonspecific.   Patient will be admitted to the hospital for evaluation and management/treatment of hypokalemia/electrolyte abnormalities.  Patient / Family / Caregiver informed of clinical course, medical decision-making process, and agree with plan.   I discussed return precautions, follow-up instructions, and discharged instructions with patient and/or family.  ___________________________________________   FINAL CLINICAL IMPRESSION(S) / ED DIAGNOSES   Final diagnoses:  Hyponatremia  Hypokalemia      Lisa Roca, MD 09/05/15 2043

## 2015-09-05 NOTE — ED Notes (Signed)
Patient transported to CT 

## 2015-09-05 NOTE — H&P (Signed)
[place] [facility] [place] NAME: [name]    MR#:  [id number]  DATE OF BIRTH:  1961-01-08  DATE OF ADMISSION:  09/05/2015  PRIMARY CARE PHYSICIAN: [name], MD   REQUESTING/REFERRING PHYSICIAN:   CHIEF COMPLAINT:   Chief Complaint  Patient presents with  . Weakness    HISTORY OF PRESENT ILLNESS: [name]  is a 54 y.o. male with a known history of severe hearing loss, epilepsy, hypertension, renal insufficiency, chronic, who presents to the hospital with complaints of fall. E patient has been having fatigue and weakness since Thursday last week for approximately 3-4 days he's been also admitting of weight loss for the past 2 months due to poor appetite as well as nausea. Unfortunately, unable to provide more review of systems due to poor hearing. Lab studies revealed low sodium level of 118 and potassium level of 2.8.   PAST MEDICAL HISTORY:   Past Medical History  Diagnosis Date  . Hypertension   . Seizures   . Kidney disease   . Anemia   . Migraine     PAST SURGICAL HISTORY: History reviewed. No pertinent past surgical history.  SOCIAL HISTORY:  Social History  Substance Use Topics  . Smoking status: Never Smoker   . Smokeless tobacco: Not on file  . Alcohol Use: No    FAMILY HISTORY:  Family History  Problem Relation Age of Onset  . Heart attack Father   . Kidney disease Father     DRUG ALLERGIES: No Known Allergies  Review of Systems  Unable to perform ROS: other  Constitutional: Positive for malaise/fatigue.  Respiratory: Positive for cough.   Neurological: Positive for weakness.   unable to perform review of systems due to severe hearing loss  MEDICATIONS AT HOME:  Prior to Admission medications   Medication Sig Start Date End Date Taking? Authorizing Provider  amLODipine (NORVASC) 5 MG tablet Take 5 mg by mouth daily.   Yes Historical Provider, MD  butalbital-acetaminophen-caffeine (FIORICET,  ESGIC) 50-325-40 MG per tablet Take 1-2 tablets by mouth every 4 (four) hours as needed for migraine.   Yes Historical Provider, MD  carbamazepine (TEGRETOL) 200 MG tablet Take 200-400 mg by mouth 3 (three) times daily. Pt takes two in the morning, one at noon, and two at bedtime.   Yes Historical Provider, MD  divalproex (DEPAKOTE) 500 MG DR tablet Take 500-1,000 mg by mouth 3 (three) times daily. Pt takes two in the morning, one at noon, and two at bedtime.   Yes Historical Provider, MD  lisinopril (PRINIVIL,ZESTRIL) 20 MG tablet Take 20 mg by mouth daily.   Yes Historical Provider, MD  propranolol (INDERAL) 20 MG tablet Take 40 mg by mouth 2 (two) times daily.   Yes Historical Provider, MD      PHYSICAL EXAMINATION:   VITAL SIGNS: Blood pressure 155/98, pulse 81, temperature 98.7 F (37.1 C), temperature source Oral, resp. rate 12, height 5\' 3"  (1.6 m), weight 61.236 kg (135 lb), SpO2 96 %.  GENERAL:  54 y.o.-year-old patient lying in the bed with no acute distress. Tremulous and restless , slow response time. Severe hearing loss EYES: Pupils equal, round, reactive to light and accommodation. No scleral icterus. Extraocular muscles intact.  HEENT: Head atraumatic, normocephalic. Oropharynx and nasopharynx clear.  NECK:  Supple, no jugular venous distention. No thyroid enlargement, no tenderness.  LUNGS: Normal breath sounds bilaterally, no wheezing, rales,rhonchi or crepitation. No use of accessory muscles of respiration.  CARDIOVASCULAR: S1, S2 normal. No murmurs, rubs, or gallops.  ABDOMEN: Soft, nontender, nondistended. Bowel sounds present. No organomegaly or mass.  EXTREMITIES: No pedal edema, cyanosis, or clubbing.  NEUROLOGIC: Cranial nerves II through XII are intact. Muscle strength 5/5 in all extremities. Sensation intact. Gait not checked.  PSYCHIATRIC: The patient is alert and oriented x 3.  SKIN: No obvious rash, lesion, or ulcer.   LABORATORY PANEL:   CBC  Recent  Labs Lab 09/05/15 1537  WBC 3.7*  HGB 12.1*  HCT 34.6*  PLT 191  MCV 93.5  MCH 32.6  MCHC 34.9  RDW 13.6  LYMPHSABS 0.9*  MONOABS 0.3  EOSABS 0.0  BASOSABS 0.0   ------------------------------------------------------------------------------------------------------------------  Chemistries   Recent Labs Lab 09/05/15 1537  NA 118*  K 2.8*  CL 76*  CO2 22  GLUCOSE 91  BUN 18  CREATININE 1.33*  CALCIUM 9.8   ------------------------------------------------------------------------------------------------------------------  Cardiac Enzymes  Recent Labs Lab 09/05/15 1537  TROPONINI 0.04*   ------------------------------------------------------------------------------------------------------------------  RADIOLOGY: Dg Chest 2 View  09/05/2015   CLINICAL DATA:  Lightheaded dizzy shortness breath chest pain  EXAM: CHEST  2 VIEW  COMPARISON:  01/28/2015  FINDINGS: The heart size and mediastinal contours are within normal limits. Both lungs are clear. The visualized skeletal structures are unremarkable.  IMPRESSION: No active cardiopulmonary disease.   Electronically Signed   By: Skipper Cliche M.D.   On: 09/05/2015 16:33   Ct Head Wo Contrast  09/05/2015   CLINICAL DATA:  54 year old with acute onset of generalized weakness, lightheadedness and dizziness earlier today. Current history of epilepsy.  EXAM: CT HEAD WITHOUT CONTRAST  TECHNIQUE: Contiguous axial images were obtained from the base of the skull through the vertex without intravenous contrast.  COMPARISON:  07/18/2010.  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  Moderate cortical, deep and cerebellar atrophy, unchanged. No mass lesion. No midline shift. No acute hemorrhage or hematoma. No extra-axial fluid collections. No evidence of acute infarction.  Small mucous  retention cyst or polyp in the right maxillary sinus. Small air-fluid level in the left maxillary sinus. Opacification of scattered bilateral middle ethmoid air cells. Bilateral paranasal sinuses in their cavities well aerated.  IMPRESSION: 1. No acute intracranial abnormality. 2. Moderate generalized atrophy. 3. Mild chronic right frontal sinus disease. Possible acute mild left maxillary sinus disease.   Electronically Signed   By: Evangeline Dakin M.D.   On: 09/05/2015 16:55    EKG: Orders placed or performed during the hospital encounter of 09/05/15  . ED EKG  . ED EKG  . EKG 12-Lead  . EKG 12-Lead    IMPRESSION AND PLAN:  Principal Problem:   Hyponatremia Active Problems:   Hypokalemia   Elevated troponin   Leukopenia   Renal insufficiency 1. Hyponatremia, likely dehydration related. This patient reports weight loss and poor appetite initiated patient on IV fluids as follows sodium level in the morning 2. Hypokalemia. Check magnesium level and supplement IV, follow potassium level in the morning 3. Generalized weakness, get physical therapy involved 4. Weight loss due to unknown etiology, initiate patient on PPI, Zofran IV fluids, get gastroenterologist involved for further recommendations 5. Elevated troponin. Check cardiac enzymes 3, initiate patient on aspirin, metoprolol, nitroglycerin as well as Lovenox, get cardiologist involved for further recommendations 6. Leukopenia, follow tomorrow morning All the records are reviewed and case discussed with ED  provider. Management plans discussed with the patient, family and they are in agreement.  CODE STATUS:  Full code  TOTAL TIME TAKING CARE OF THIS PATIENT: 55 minutes.    Theodoro Grist M.D on 09/05/2015 at 9:34 PM  Between 7am to 6pm - Pager - [phone] After 6pm go to [email or web address] - password [email or web address] [facility]  Office  [phone]  CC: Primary care physician; [name], MD

## 2015-09-05 NOTE — ED Notes (Signed)
Lab called regarding mag, will add on at this time

## 2015-09-06 ENCOUNTER — Inpatient Hospital Stay (HOSPITAL_COMMUNITY)
Admit: 2015-09-06 | Discharge: 2015-09-06 | Disposition: A | Discharge status: Home / Self Care | Payer: Self-pay | Attending: Physician Assistant | Admitting: Physician Assistant

## 2015-09-06 ENCOUNTER — Encounter: Payer: Self-pay | Admitting: Physician Assistant

## 2015-09-06 DIAGNOSIS — D72819 Decreased white blood cell count, unspecified: Secondary | ICD-10-CM

## 2015-09-06 DIAGNOSIS — R079 Chest pain, unspecified: Secondary | ICD-10-CM

## 2015-09-06 DIAGNOSIS — E871 Hypo-osmolality and hyponatremia: Principal | ICD-10-CM

## 2015-09-06 DIAGNOSIS — R7989 Other specified abnormal findings of blood chemistry: Secondary | ICD-10-CM

## 2015-09-06 DIAGNOSIS — E876 Hypokalemia: Secondary | ICD-10-CM

## 2015-09-06 DIAGNOSIS — N289 Disorder of kidney and ureter, unspecified: Secondary | ICD-10-CM

## 2015-09-06 LAB — BASIC METABOLIC PANEL
ANION GAP: 12 (ref 5–15)
ANION GAP: 12 (ref 5–15)
BUN: 17 mg/dL (ref 6–20)
BUN: 16 mg/dL (ref 6–20)
CALCIUM: 8.9 mg/dL (ref 8.9–10.3)
CALCIUM: 9.1 mg/dL (ref 8.9–10.3)
CHLORIDE: 85 mmol/L — AB (ref 101–111)
CO2: 24 mmol/L (ref 22–32)
CO2: 23 mmol/L (ref 22–32)
CREATININE: 1.29 mg/dL — AB (ref 0.61–1.24)
Chloride: 91 mmol/L — ABNORMAL LOW (ref 101–111)
Creatinine, Ser: 1.31 mg/dL — ABNORMAL HIGH (ref 0.61–1.24)
GFR calc non Af Amer: >60 mL/min (ref >60)
GFR, EST NON AFRICAN AMERICAN: 59 mL/min — AB (ref >60)
GLUCOSE: 79 mg/dL (ref 65–99)
Glucose, Bld: 79 mg/dL (ref 65–99)
POTASSIUM: 3.9 mmol/L (ref 3.5–5.1)
Potassium: 3.7 mmol/L (ref 3.5–5.1)
SODIUM: 121 mmol/L — AB (ref 135–145)
SODIUM: 126 mmol/L — AB (ref 135–145)

## 2015-09-06 LAB — CBC
HEMATOCRIT: 31.3 % — AB (ref 40.0–52.0)
HEMOGLOBIN: 11.2 g/dL — AB (ref 13.0–18.0)
MCH: 33.9 pg (ref 26.0–34.0)
MCHC: 35.9 g/dL (ref 32.0–36.0)
MCV: 94.6 fL (ref 80.0–100.0)
Platelets: 166 10*3/uL (ref 150–440)
RBC: 3.31 MIL/uL — ABNORMAL LOW (ref 4.40–5.90)
RDW: 14 % (ref 11.5–14.5)
WBC: 4.3 10*3/uL (ref 3.8–10.6)

## 2015-09-06 LAB — TROPONIN I
TROPONIN I: 0.06 ng/mL — AB (ref <0.031)
TROPONIN I: 0.04 ng/mL — AB (ref <0.031)

## 2015-09-06 MED ORDER — SODIUM CHLORIDE 0.9 % IJ SOLN
3.0000 mL | Freq: Two times a day (BID) | INTRAMUSCULAR | Status: DC
  Administered 2015-09-06: 3 mL via INTRAVENOUS

## 2015-09-06 MED ORDER — POTASSIUM CHLORIDE IN NACL 40-0.9 MEQ/L-% IV SOLN
INTRAVENOUS | Status: DC
  Administered 2015-09-06 – 2015-09-07 (×3): 75 mL/h via INTRAVENOUS
  Filled 2015-09-06 (×7): qty 1000

## 2015-09-06 MED ORDER — ACETAMINOPHEN 650 MG RE SUPP: 650.0000 mg | Freq: Four times a day (QID) | RECTAL | Status: DC | PRN

## 2015-09-06 MED ORDER — CARBAMAZEPINE 200 MG PO TABS: 200.0000 mg | ORAL_TABLET | Freq: Three times a day (TID) | ORAL | Status: DC

## 2015-09-06 MED ORDER — ACETAMINOPHEN 325 MG PO TABS: 650.0000 mg | ORAL_TABLET | Freq: Four times a day (QID) | ORAL | Status: DC | PRN

## 2015-09-06 MED ORDER — DIVALPROEX SODIUM 500 MG PO DR TAB
1000.0000 mg | DELAYED_RELEASE_TABLET | ORAL | Status: DC
  Administered 2015-09-06 – 2015-09-07 (×3): 1000 mg via ORAL
  Filled 2015-09-06 (×3): qty 2

## 2015-09-06 MED ORDER — CARBAMAZEPINE 200 MG PO TABS
200.0000 mg | ORAL_TABLET | Freq: Every day | ORAL | Status: DC
  Administered 2015-09-06 – 2015-09-07 (×2): 200 mg via ORAL
  Filled 2015-09-06 (×2): qty 1

## 2015-09-06 MED ORDER — PROPRANOLOL HCL 40 MG PO TABS
40.0000 mg | ORAL_TABLET | Freq: Two times a day (BID) | ORAL | Status: DC
  Administered 2015-09-06 – 2015-09-07 (×3): 40 mg via ORAL
  Filled 2015-09-06 (×4): qty 1

## 2015-09-06 MED ORDER — BUTALBITAL-APAP-CAFFEINE 50-325-40 MG PO TABS
1.0000 | ORAL_TABLET | ORAL | Status: DC | PRN
  Administered 2015-09-06: 2 via ORAL
  Filled 2015-09-06: qty 2

## 2015-09-06 MED ORDER — ONDANSETRON HCL 4 MG/2ML IJ SOLN: 4.0000 mg | Freq: Four times a day (QID) | INTRAMUSCULAR | Status: DC | PRN

## 2015-09-06 MED ORDER — ASPIRIN EC 81 MG PO TBEC: 81.0000 mg | DELAYED_RELEASE_TABLET | Freq: Every day | ORAL | Status: DC

## 2015-09-06 MED ORDER — ENOXAPARIN SODIUM 40 MG/0.4ML ~~LOC~~ SOLN
40.0000 mg | SUBCUTANEOUS | Status: DC
  Administered 2015-09-06 – 2015-09-07 (×2): 40 mg via SUBCUTANEOUS
  Filled 2015-09-06 (×2): qty 0.4

## 2015-09-06 MED ORDER — ONDANSETRON HCL 4 MG PO TABS
4.0000 mg | ORAL_TABLET | Freq: Four times a day (QID) | ORAL | Status: DC | PRN
Start: 2015-09-06 — End: 2015-09-07

## 2015-09-06 MED ORDER — DIVALPROEX SODIUM 500 MG PO DR TAB: 500.0000 mg | DELAYED_RELEASE_TABLET | Freq: Three times a day (TID) | ORAL | Status: DC

## 2015-09-06 MED ORDER — CARBAMAZEPINE 200 MG PO TABS
400.0000 mg | ORAL_TABLET | ORAL | Status: DC
  Administered 2015-09-06 – 2015-09-07 (×3): 400 mg via ORAL
  Filled 2015-09-06 (×3): qty 2

## 2015-09-06 MED ORDER — DIVALPROEX SODIUM 500 MG PO DR TAB
500.0000 mg | DELAYED_RELEASE_TABLET | Freq: Every day | ORAL | Status: DC
  Administered 2015-09-06 – 2015-09-07 (×2): 500 mg via ORAL
  Filled 2015-09-06 (×2): qty 1

## 2015-09-06 MED ORDER — LISINOPRIL 20 MG PO TABS
20.0000 mg | ORAL_TABLET | Freq: Every day | ORAL | Status: DC
  Administered 2015-09-06 – 2015-09-07 (×2): 20 mg via ORAL
  Filled 2015-09-06 (×2): qty 1

## 2015-09-06 NOTE — Progress Notes (Signed)
A@o . No c/o pain. Airway patient no distress noted. Telemetry reading sb 59. Tremors noted. ivf infusing. Call bell within reach. Will continue to monitor [employer]

## 2015-09-06 NOTE — Consult Note (Signed)
Cardiology Consultation Note  Patient ID: Trevell Bailey, MRN: [id number], DOB/AGE: [birth year] 54 y.o. Admit date: 09/05/2015   Date of Consult: 09/06/2015 Primary Physician: Devin Pitch, MD Primary Cardiologist: Dr. Rockey Situ, MD  Chief Complaint: Weakness Reason for Consult: Elevated troponin   HPI: 54 y.o. male with h/o seizure disorder on Tegretol, poorly controlled HTN, essential tremor, CKD stage II, vevnous insufficiency, and difficulty hearing who presented to [facility] on 9/19 with complaints of weakness, fatigue, and suffering a fall. He was found to have hyponatremia of 118 and hypokalemia of 2.8. Cardiology is consulted of troponin if 0.04-->0.06-->0.06.   He was recently admitted to [facility] back in May of 2016 with complaints of chest pain and notes indicating irregular heart rhythm. He underwent Lexiscan Myoview that was low risk. In hospital follow up in July 2016, he reported feeling very weak, though denied feeling any chest pain concerning for angina. He had stopped taking his his amlodipine and lisinopril prior to the July visit because he felt like his BP was running too low and he felt like he was going to pass out. In fact, his BP was elevated at 160/70. At that visit he was restarted on lisinopril 20 mg daily. Amlodipine was avoided given possible leg swelling from venous insufficieny. He was also continued on his propranolol for his essential tremor.   He presented to [facility] on 9/19 with ongoing weakness, fatigue, and weight loss. Upon his arrival he was found to be hyponatremic and hypokalemic as above. He denies any BRBPR, melena, hematemesis, or hematochezia. He had suffered a mechanical fall leading to his presentation. His neighbor has called the hospital stating the only food in the patient's house is tater tots. The patient reports weight loss of 40 pounds over the past 1 year. He denies any chest pain, SOB, diaphoresis, nausea, vomiting, presyncope, or syncope. No palpitations.    Upon his arrival Na+ and K+ as above. Troponin as above. WBC 3.7, hgb 12.1, plt 191, SCr 1.33 (baseline approximately 1.3), lactic acid 1.3, Mg++ 1.2, CT head with no acute intracranial abnormality, moderate generalized atrophy, CXR with no active cardiopulmonary disease. His K+ has been repleted to 3.9 this morning and Na+ has been repleted to 126 to date. This morning he denies any cardiac symptoms.           Past Medical History  Diagnosis Date  . Hypertension   . Seizures   . Kidney disease   . Anemia   . Migraine       Most Recent Cardiac Studies: Echo 04/18/2015:   The study is normal.  This is a low risk study.   Surgical History: History reviewed. No pertinent past surgical history.   Home Meds: Prior to Admission medications   Medication Sig Start Date End Date Taking? Authorizing Provider  amLODipine (NORVASC) 5 MG tablet Take 5 mg by mouth daily.   Yes Historical Provider, MD  butalbital-acetaminophen-caffeine (FIORICET, ESGIC) 50-325-40 MG per tablet Take 1-2 tablets by mouth every 4 (four) hours as needed for migraine.   Yes Historical Provider, MD  carbamazepine (TEGRETOL) 200 MG tablet Take 200-400 mg by mouth 3 (three) times daily. Pt takes two in the morning, one at noon, and two at bedtime.   Yes Historical Provider, MD  divalproex (DEPAKOTE) 500 MG DR tablet Take 500-1,000 mg by mouth 3 (three) times daily. Pt takes two in the morning, one at noon, and two at bedtime.   Yes Historical Provider, MD  lisinopril (PRINIVIL,ZESTRIL) 20 MG  tablet Take 20 mg by mouth daily.   Yes Historical Provider, MD  propranolol (INDERAL) 20 MG tablet Take 40 mg by mouth 2 (two) times daily.   Yes Historical Provider, MD    Inpatient Medications:  . amLODipine  5 mg Oral Daily  . aspirin  81 mg Oral Daily  . carbamazepine  200 mg Oral Q1200  . carbamazepine  400 mg Oral BH-qamhs  . divalproex  1,000 mg Oral BH-qamhs  . divalproex  500 mg Oral Q1200  . enoxaparin (LOVENOX)  injection  40 mg Subcutaneous Q24H  . lisinopril  20 mg Oral Daily  . magnesium sulfate 1 - 4 g bolus IVPB  4 g Intravenous BID  . nitroGLYCERIN  0.5 inch Topical 4 times per day  . pantoprazole  40 mg Oral Daily  . propranolol  40 mg Oral BID  . sodium chloride  3 mL Intravenous Q12H   . 0.9 % NaCl with KCl 40 mEq / L 75 mL/hr (09/06/15 0122)    Allergies: No Known Allergies  Social History   Social History  . Marital Status: Single    Spouse Name: N/A  . Number of Children: N/A  . Years of Education: N/A   Occupational History  . Not on file.   Social History Main Topics  . Smoking status: Never Smoker   . Smokeless tobacco: Not on file  . Alcohol Use: No  . Drug Use: No  . Sexual Activity: Not on file   Other Topics Concern  . Not on file   Social History Narrative   Cross dresses      Family History  Problem Relation Age of Onset  . Heart attack Father   . Kidney disease Father      Review of Systems: Review of Systems  Constitutional: Positive for weight loss and malaise/fatigue. Negative for fever, chills and diaphoresis.       40 pound weight loss over 1 year time span  HENT: Positive for hearing loss. Negative for congestion, ear pain and tinnitus.   Eyes: Positive for blurred vision. Negative for double vision, photophobia, pain, discharge and redness.  Respiratory: Negative for cough, hemoptysis, sputum production, shortness of breath and wheezing.   Cardiovascular: Negative for chest pain, palpitations, orthopnea, claudication, leg swelling and PND.  Gastrointestinal: Negative for heartburn, nausea, vomiting, abdominal pain, diarrhea, constipation, blood in stool and melena.  Genitourinary: Negative for hematuria.  Musculoskeletal: Negative for myalgias, back pain, joint pain, falls and neck pain.  Skin: Positive for rash. Negative for itching.       Erythematous lesions along right forearm   Neurological: Positive for dizziness and weakness.  Negative for sensory change, speech change, focal weakness, seizures, loss of consciousness and headaches.  Endo/Heme/Allergies: Does not bruise/bleed easily.  Psychiatric/Behavioral: Negative for depression. The patient is nervous/anxious.   All other systems reviewed and are negative.    Labs:  Recent Labs  09/05/15 1537 09/05/15 2213 09/06/15 0632  TROPONINI 0.04* 0.06* 0.06*   Lab Results  Component Value Date   WBC 4.3 09/06/2015   HGB 11.2* 09/06/2015   HCT 31.3* 09/06/2015   MCV 94.6 09/06/2015   PLT 166 09/06/2015     Recent Labs Lab 09/06/15 0632  NA 126*  K 3.9  CL 91*  CO2 23  BUN 16  CREATININE 1.31*  CALCIUM 9.1  GLUCOSE 79   Lab Results  Component Value Date   CHOL 196 11/27/2013   HDL 29* 11/27/2013  [place] 123* 11/27/2013   TRIG 219* 11/27/2013   No results found for: DDIMER  Radiology/Studies:  Dg Chest 2 View  09/05/2015   CLINICAL DATA:  Lightheaded dizzy shortness breath chest pain  EXAM: CHEST  2 VIEW  COMPARISON:  01/28/2015  FINDINGS: The heart size and mediastinal contours are within normal limits. Both lungs are clear. The visualized skeletal structures are unremarkable.  IMPRESSION: No active cardiopulmonary disease.   Electronically Signed   By: Skipper Cliche M.D.   On: 09/05/2015 16:33   Ct Head Wo Contrast  09/05/2015   CLINICAL DATA:  54 year old with acute onset of generalized weakness, lightheadedness and dizziness earlier today. Current history of epilepsy.  EXAM: CT HEAD WITHOUT CONTRAST  TECHNIQUE: Contiguous axial images were obtained from the base of the skull through the vertex without intravenous contrast.  COMPARISON:  07/18/2010.  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  Moderate cortical, deep and cerebellar atrophy, unchanged. No mass lesion. No midline shift. No acute  hemorrhage or hematoma. No extra-axial fluid collections. No evidence of acute infarction.  Small mucous retention cyst or polyp in the right maxillary sinus. Small air-fluid level in the left maxillary sinus. Opacification of scattered bilateral middle ethmoid air cells. Bilateral paranasal sinuses in their cavities well aerated.  IMPRESSION: 1. No acute intracranial abnormality. 2. Moderate generalized atrophy. 3. Mild chronic right frontal sinus disease. Possible acute mild left maxillary sinus disease.   Electronically Signed   By: Evangeline Dakin M.D.   On: 09/05/2015 16:55    EKG: NSR, 88 bpm, Q waves, nonspecific st/t changes   Weights: Filed Weights   09/05/15 1534 09/06/15 0040  Weight: 135 lb (61.236 kg) 132 lb 4.8 oz (60.011 kg)     Physical Exam: Blood pressure 159/96, pulse 55, temperature 98.2 F (36.8 C), temperature source Oral, resp. rate 18, height 5\' 3"  (1.6 m), weight 132 lb 4.8 oz (60.011 kg), SpO2 100 %. Body mass index is 23.44 kg/(m^2). General: Well developed, well nourished, in no acute distress. Disheveled.   Head: Normocephalic, atraumatic, sclera non-icteric, no xanthomas, nares are without discharge. HOH.   Neck: Negative for carotid bruits. JVD not elevated. Lungs: Clear bilaterally to auscultation without wheezes, rales, or rhonchi. Breathing is unlabored. Heart: RRR with S1 S2. No murmurs, rubs, or gallops appreciated. Abdomen: Soft, non-tender, non-distended with normoactive bowel sounds. No hepatomegaly. No rebound/guarding. No obvious abdominal masses. Msk:  Strength and tone appear normal for age. Extremities: No clubbing or cyanosis. No edema.  Distal pedal pulses are 2+ and equal bilaterally. Neuro: Alert and oriented X 3. No facial asymmetry. No focal deficit. Moves all extremities spontaneously. Psych:  Responds to questions appropriately with a normal affect.    Assessment and Plan:  54 y.o. male with h/o seizure disorder on Tegretol, poorly  controlled HTN, essential tremor, CKD stage II, vevnous insufficiency, and difficulty hearing who presented to [facility] on 9/19 with complaints of weakness, fatigue, and suffering a fall. He was found to have hyponatremia of 118 and hypokalemia of 2.8. Cardiology is consulted of troponin if 0.04-->0.06-->0.06.  1. Elevated troponin: -Likely supply demand ischemia in the setting of his profound hyponatremia, hypokalemia, fall, and malnutrition  -Add on CK and CK-MB to evaluate for rhabdomyolysis vs true myocardial injury  -Check echo to evaluate LV function, wall motion, and right-side pressure -If the above are unremarkable  no further cardiac work up at this time  2. Hyponatremia/hypokalemia/generalized weakness/fatigue/leukopenia/anemia/falls/weight loss: -Of uncertain etiology at this time -He has not had true pancytopenia at the same time -Consider HIV testing and tick borne illness testing -Also consider malignancy vs medication induced  -PT  3. CKD stage II: -Stable -Monitor   Signed, Christell Faith, PA-C Pager: [phone] 09/06/2015, 10:43 AM

## 2015-09-06 NOTE — Progress Notes (Addendum)
Initial Nutrition Assessment  DOCUMENTATION CODES:   Severe malnutrition in context of chronic illness  INTERVENTION:   Meals and Snacks: Cater to patient preferences. Recommend [employer] diet order as pt with very strong tremor. Pt needs assistance with feeding, Nsg aware and providing with encouragement.  Medical Food Supplement Therapy: will send Magic cup BID and Mighty Shakes on meal trays, with a lid, TID for added nutrition (each supplement provides approximately 300kcals and 9g protein).   NUTRITION DIAGNOSIS:   Malnutrition related to chronic illness as evidenced by percent weight loss, energy intake < or equal to 75% for > or equal to 1 month.  GOAL:   Patient will meet greater than or equal to 90% of their needs  MONITOR:    (Energy Intake, Digestive System, Anthropometrics)  REASON FOR ASSESSMENT:   Malnutrition Screening Tool    ASSESSMENT:   Pt admitted with hyponatremia, weight loss with recent admissions May and July 2016 to [facility]. Pt with h/o seizure disorder. Per RN, Amy pt with very strong tremors, needs assistance with feeding. Pt also with severe hearing loss.  Past Medical History  Diagnosis Date  . Hypertension   . Seizures   . Kidney disease   . Anemia   . Migraine     Diet Order:  Diet 2 gram sodium Room service appropriate?: Yes; Fluid consistency:: Thin    Current Nutrition: Pt ate well this am at breakfast with the help of RN Amy this am. Pt reports he wanted to sleep and had not had any lunch.   Food/Nutrition-Related History: Pt reports poor appetite and poor po intake for longer than the past month. Pt reports eating a big breakfast but only snacks later. Per neighbor reported to RN Amy, pt has only tater tots at home and eats that throughout the day when able. RD asked if pt drinks Ensure, pt reports trying once at a hospital in the past and that he spilled it all over himself.     Medications: NS with KCl at 95mL/hr, Protonix,  depakote  Electrolyte/Renal Profile and Glucose Profile:   Recent Labs Lab 09/05/15 1537 09/05/15 1546 09/06/15 0149 09/06/15 0632  NA 118*  --  121* 126*  K 2.8*  --  3.7 3.9  CL 76*  --  85* 91*  CO2 22  --  24 23  BUN 18  --  17 16  CREATININE 1.33*  --  1.29* 1.31*  CALCIUM 9.8  --  8.9 9.1  MG  --  1.2*  --   --   GLUCOSE 91  --  79 79   Protein Profile: No results for input(s): ALBUMIN in the last 168 hours.  Gastrointestinal Profile: WDL per Nsg documentation Last BM: unknown   Nutrition-Focused Physical Exam Findings: Nutrition-Focused physical exam completed. Findings are WDL for fat depletion, mild muscle depletion of lower extremity only, and no edema.    Weight Change: Pt reports weight loss of 30lbs in 4 months; per CH[facility] 15lbs weight loss in 4 months (10% loss in 2-4 months). Per MD note 40lbs in the past year (23% weight loss in one year).    Skin:  Reviewed, no issues  Height:   Ht Readings from Last 1 Encounters:  09/05/15 5\' 3"  (1.6 m)    Weight:   Wt Readings from Last 1 Encounters:  09/06/15 132 lb 4.8 oz (60.011 kg)   Wt Readings from Last 10 Encounters:  09/06/15 132 lb 4.8 oz (60.011 kg)  07/15/15  147 lb 8 oz (66.906 kg)  04/18/15 147 lb 3.2 oz (66.769 kg)     BMI:  Body mass index is 23.44 kg/(m^2).  Estimated Nutritional Needs:   Kcal:  BEE: 1320kcals, TEE: (IF 1.2-1.4)(AF 1.2) 1900-2218kcals  Protein:  66-78g protein (1.1-1.3g/kg)  Fluid:  1500-1869mL of fluid (25-30mL/kg)  EDUCATION NEEDS:   Education needs no appropriate at this time   Nelson, RD, LDN Pager [phone]

## 2015-09-06 NOTE — Care Management (Signed)
Staff verbalize concerns regarding patient's ability  To manage on his own.  Patient presents from home.  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  Says he "brings in  About one grand a month."  His condo is paid for.  Says that he is independent in all adls, and drives.  Has no insurance because he refuses to make insurance companies rich off of premiums.  PCP is [name] and he obtains his medications through [facility].  Patient has constant tremors and at times it seems as if he has word finding issues. He says the reason he did not ambulate well with physical therapy is because he did not have good shoes on.  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  Updated CSW.

## 2015-09-06 NOTE — Progress Notes (Addendum)
[place] [facility] [place] NAME: [name]    MR#:  [id number]  DATE OF BIRTH:  May 03, 1961  SUBJECTIVE:   Denies any complaints other than chronic tremors. Eating well REVIEW OF SYSTEMS:   Review of Systems  Constitutional: Negative for fever, chills and weight loss.  HENT: Negative for ear discharge, ear pain and nosebleeds.   Eyes: Negative for blurred vision, pain and discharge.  Respiratory: Negative for sputum production, shortness of breath, wheezing and stridor.   Cardiovascular: Negative for chest pain, palpitations, orthopnea and PND.  Gastrointestinal: Negative for nausea, vomiting, abdominal pain and diarrhea.  Genitourinary: Negative for urgency and frequency.  Musculoskeletal: Negative for back pain and joint pain.  Neurological: Positive for tremors and weakness. Negative for sensory change, speech change and focal weakness.  Psychiatric/Behavioral: Negative for depression. The patient is not nervous/anxious.   All other systems reviewed and are negative.  Tolerating Diet:yes Tolerating PT: pending eval  DRUG ALLERGIES:  No Known Allergies  VITALS:  Blood pressure 133/77, pulse 51, temperature 98.1 F (36.7 C), temperature source Oral, resp. rate 15, height 5\' 3"  (1.6 m), weight 60.011 kg (132 lb 4.8 oz), SpO2 100 %.  PHYSICAL EXAMINATION:   Physical Exam  GENERAL:  54 y.o.-year-old patient lying in the bed with no acute distress.  EYES: Pupils equal, round, reactive to light and accommodation. No scleral icterus. Extraocular muscles intact.  HEENT: Head atraumatic, normocephalic. Oropharynx and nasopharynx clear.  NECK:  Supple, no jugular venous distention. No thyroid enlargement, no tenderness.  LUNGS: Normal breath sounds bilaterally, no wheezing, rales, rhonchi. No use of accessory muscles of respiration.  CARDIOVASCULAR: S1, S2 normal. No murmurs, rubs, or gallops.  ABDOMEN: Soft, nontender, nondistended.  Bowel sounds present. No organomegaly or mass.  EXTREMITIES: No cyanosis, clubbing or edema b/l.    NEUROLOGIC: Cranial nerves II through XII are intact. No focal Motor or sensory deficits b/l.   PSYCHIATRIC: The patient is alert and oriented x 3.  SKIN: No obvious rash, lesion, or ulcer.    LABORATORY PANEL:   CBC  Recent Labs Lab 09/06/15 0632  WBC 4.3  HGB 11.2*  HCT 31.3*  PLT 166    Chemistries   Recent Labs Lab 09/05/15 1546  09/06/15 0632  NA  --   < > 126*  K  --   < > 3.9  CL  --   < > 91*  CO2  --   < > 23  GLUCOSE  --   < > 79  BUN  --   < > 16  CREATININE  --   < > 1.31*  CALCIUM  --   < > 9.1  MG 1.2*  --   --   < > = values in this interval not displayed.  Cardiac Enzymes  Recent Labs Lab 09/06/15 1220  TROPONINI 0.04*    RADIOLOGY:  Dg Chest 2 View  09/05/2015   CLINICAL DATA:  Lightheaded dizzy shortness breath chest pain  EXAM: CHEST  2 VIEW  COMPARISON:  01/28/2015  FINDINGS: The heart size and mediastinal contours are within normal limits. Both lungs are clear. The visualized skeletal structures are unremarkable.  IMPRESSION: No active cardiopulmonary disease.   Electronically Signed   By: Skipper Cliche M.D.   On: 09/05/2015 16:33   Ct Head Wo Contrast  09/05/2015   CLINICAL DATA:  54 year old with acute onset of generalized weakness, lightheadedness and dizziness earlier today. Current history of  epilepsy.  EXAM: CT HEAD WITHOUT CONTRAST  TECHNIQUE: Contiguous axial images were obtained from the base of the skull through the vertex without intravenous contrast.  COMPARISON:  07/18/2010.  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  Moderate cortical, deep and cerebellar atrophy, unchanged. No mass lesion. No midline shift. No acute hemorrhage or hematoma. No extra-axial fluid collections. No evidence  of acute infarction.  Small mucous retention cyst or polyp in the right maxillary sinus. Small air-fluid level in the left maxillary sinus. Opacification of scattered bilateral middle ethmoid air cells. Bilateral paranasal sinuses in their cavities well aerated.  IMPRESSION: 1. No acute intracranial abnormality. 2. Moderate generalized atrophy. 3. Mild chronic right frontal sinus disease. Possible acute mild left maxillary sinus disease.   Electronically Signed   By: Evangeline Dakin M.D.   On: 09/05/2015 16:55    ASSESSMENT AND PLAN:   54 y.o. male with a known history of severe hearing loss, epilepsy, hypertension, renal insufficiency, chronic, who presents to the hospital with complaints of fall. E patient has been having fatigue and weakness since Thursday last week   1. Hyponatremia, likely dehydration related. This patient reports weight loss and poor appetite initiated patient on IV fluids as follows sodium level in the morning -NA 118--->121-->126 -IVF NS  2. Hypokalemia.  -replace magnesium level and supplement IV, follow potassium level in the morning  3. Generalized weakness, get physical therapy involved  4. Weight loss due to unknown etiology, initiate patient on PPI, Zofran IV fluids -reports eating ok here  5. Elevated troponin. Mild, no cp -pt has outpt cardiology visit in July. No cardiac history -EKG no acute changes  6.chronic seizure disorder -cont psych meds  7. PT to see pt Recommends rehab but pt refuses it Case discussed with Care Management/Social Worker. Management plans discussed with the patient, family and they are in agreement.  CODE STATUS: full  DVT Prophylaxis: lovenox  TOTAL TIME TAKING CARE OF THIS PATIENT: 35 minutes.  >50% time spent on counselling and coordination of care  POSSIBLE D/C IN 1-2 DAYS, DEPENDING ON CLINICAL CONDITION.   Elma Shands M.D on 09/06/2015 at 1:38 PM  Between 7am to 6pm - Pager - [phone]  After 6pm go to  [email or web address] - password [email or web address] [facility]  Office  [phone]  CC: Primary care physician; [name], MD

## 2015-09-06 NOTE — Consult Note (Signed)
Consultation  Referring Provider:     Dr Serita Grit Admit date 09/05/15 Consult date        09/06/15 Reason for Consultation:     Weight loss         HPI:   [name] is a 54 y.o. male with complicated health history to include CKD (Dr Holley Raring), seizures, epilepsy, htn, severe hearing loss.Admiited with weakness, fatigue, and sever hyponatremia.  Patient reports he has never had any history of luminal evaluation. States he has some mild constipation and rare heartburn. States occasional nausea. Denies abdominal pain, vomiting, melena/hematochezia, problems swallowing, further GI complaints. Feels like his appetite is good at the hospital, states it is lower at home. A friend has called with concern that patient does not have enough food at home.  Patient gives a vague answer when asked this question directly. He had a difficult time understanding why GI was consulted for this, so we discussed it at length. During interview, did note that patient, although alert and oriented, did have some unusual behavior. Discussed this with nursing staff, and as he has had several stays here, this has been noted in past, and was reported sometimes he dresses in an unusual fashion, wearing his deceased mother's clothing and wigs. Has had negative drug testing in past. I do not see any RPR. Cardiology has also been consulted for evaluation of elevated troponins. He has had several electrolyte derangements and they have recommended further testing to include HIV, tick borne diseases, and for rhabdomyolisis- as patient has had recent low risk stress testing.    Past Medical History  Diagnosis Date  . Hypertension   . Seizures   . Kidney disease   . Anemia   . Migraine     History reviewed. No pertinent past surgical history.  Family History  Problem Relation Age of Onset  . Heart attack Father   . Kidney disease Father   No known history of GI malignancy  Social History  Substance Use Topics  . Smoking  status: Never Smoker   . Smokeless tobacco: None  . Alcohol Use: No    Prior to Admission medications   Medication Sig Start Date End Date Taking? Authorizing Provider  amLODipine (NORVASC) 5 MG tablet Take 5 mg by mouth daily.   Yes Historical Provider, MD  butalbital-acetaminophen-caffeine (FIORICET, ESGIC) 50-325-40 MG per tablet Take 1-2 tablets by mouth every 4 (four) hours as needed for migraine.   Yes Historical Provider, MD  carbamazepine (TEGRETOL) 200 MG tablet Take 200-400 mg by mouth 3 (three) times daily. Pt takes two in the morning, one at noon, and two at bedtime.   Yes Historical Provider, MD  divalproex (DEPAKOTE) 500 MG DR tablet Take 500-1,000 mg by mouth 3 (three) times daily. Pt takes two in the morning, one at noon, and two at bedtime.   Yes Historical Provider, MD  lisinopril (PRINIVIL,ZESTRIL) 20 MG tablet Take 20 mg by mouth daily.   Yes Historical Provider, MD  propranolol (INDERAL) 20 MG tablet Take 40 mg by mouth 2 (two) times daily.   Yes Historical Provider, MD    Current Facility-Administered Medications  Medication Dose Route Frequency Provider Last Rate Last Dose  . 0.9 % NaCl with KCl 40 mEq / L  infusion   Intravenous Continuous Theodoro Grist, MD 75 mL/hr at 09/06/15 1425 75 mL/hr at 09/06/15 1425  . acetaminophen (TYLENOL) tablet 650 mg  650 mg Oral Q6H PRN Theodoro Grist, MD  Or  . acetaminophen (TYLENOL) suppository 650 mg  650 mg Rectal Q6H PRN Theodoro Grist, MD      . amLODipine (NORVASC) tablet 5 mg  5 mg Oral Daily Theodoro Grist, MD   5 mg at 09/06/15 1010  . aspirin chewable tablet 81 mg  81 mg Oral Daily Theodoro Grist, MD   81 mg at 09/06/15 1009  . butalbital-acetaminophen-caffeine (FIORICET, ESGIC) 50-325-40 MG per tablet 1-2 tablet  1-2 tablet Oral Q4H PRN Theodoro Grist, MD   2 tablet at 09/06/15 0348  . carbamazepine (TEGRETOL) tablet 200 mg  200 mg Oral Q1200 Theodoro Grist, MD   200 mg at 09/06/15 1247  . carbamazepine (TEGRETOL) tablet  400 mg  400 mg Oral BH-qamhs Theodoro Grist, MD   400 mg at 09/06/15 1009  . divalproex (DEPAKOTE) DR tablet 1,000 mg  1,000 mg Oral BH-qamhs Theodoro Grist, MD   1,000 mg at 09/06/15 1009  . divalproex (DEPAKOTE) DR tablet 500 mg  500 mg Oral Q1200 Theodoro Grist, MD   500 mg at 09/06/15 1246  . enoxaparin (LOVENOX) injection 40 mg  40 mg Subcutaneous Q24H Theodoro Grist, MD   40 mg at 09/06/15 0124  . lisinopril (PRINIVIL,ZESTRIL) tablet 20 mg  20 mg Oral Daily Theodoro Grist, MD   20 mg at 09/06/15 1009  . nitroGLYCERIN (NITROGLYN) 2 % ointment 0.5 inch  0.5 inch Topical 4 times per day Theodoro Grist, MD   0.5 inch at 09/06/15 1247  . ondansetron (ZOFRAN) tablet 4 mg  4 mg Oral Q6H PRN Theodoro Grist, MD       Or  . ondansetron (ZOFRAN) injection 4 mg  4 mg Intravenous Q6H PRN Theodoro Grist, MD      . pantoprazole (PROTONIX) EC tablet 40 mg  40 mg Oral Daily Theodoro Grist, MD   40 mg at 09/06/15 1010  . propranolol (INDERAL) tablet 40 mg  40 mg Oral BID Theodoro Grist, MD   40 mg at 09/06/15 1009  . sodium chloride 0.9 % injection 3 mL  3 mL Intravenous Q12H Theodoro Grist, MD   3 mL at 09/06/15 1010    Allergies as of 09/05/2015  . (No Known Allergies)     Review of Systems:    All systems reviewed and negative except where noted in HPI.      Physical Exam:  Vital signs in last 24 hours: Temp:  [98.1 F (36.7 C)-98.7 F (37.1 C)] 98.1 F (36.7 C) (09/20 1154) Pulse Rate:  [51-85] 51 (09/20 1154) Resp:  [10-20] 15 (09/20 1154) BP: (133-176)/(77-143) 133/77 mmHg (09/20 1154) SpO2:  [95 %-100 %] 100 % (09/20 1154) Weight:  [60.011 kg (132 lb 4.8 oz)] 60.011 kg (132 lb 4.8 oz) (09/20 0040)   General:   Pleasant man in NAD Head:  Normocephalic and atraumatic. Eyes:   No icterus.   Conjunctiva pink. Ears:  Decreased auditory acuity. Mouth: Mucosa pink moist, no lesions. Neck:  Supple; no masses felt Lungs:  Respirations even and unlabored. Lungs clear to auscultation bilaterally.    No wheezes, crackles, or rhonchi.  Heart:  S1S2, RRR, no MRG. No edema. Abdomen:   Flat, soft, nondistended, nontender. Normal bowel sounds. No appreciable masses or hepatomegaly. No rebound signs or other peritoneal signs. Msk:  MAEW x4, No clubbing or cyanosis. Strength 4/5. Symmetrical without gross deformities. Neurologic:  Alert and  oriented x4;  Cranial nerves II-XII intact.  Skin:  Warm, dry, pink without significant lesions or rashes. Psych:  Alert and cooperative.Rosalie Gums. Requires some re-direction.  LAB RESULTS:  Recent Labs  09/05/15 1537 09/06/15 0632  WBC 3.7* 4.3  HGB 12.1* 11.2*  HCT 34.6* 31.3*  PLT 191 166   BMET  Recent Labs  09/05/15 1537 09/06/15 0149 09/06/15 0632  NA 118* 121* 126*  K 2.8* 3.7 3.9  CL 76* 85* 91*  CO2 22 24 23   GLUCOSE 91 79 79  BUN 18 17 16   CREATININE 1.33* 1.29* 1.31*  CALCIUM 9.8 8.9 9.1   LFT No results for input(s): PROT, ALBUMIN, AST, ALT, ALKPHOS, BILITOT, BILIDIR, IBILI in the last 72 hours. PT/INR No results for input(s): LABPROT, INR in the last 72 hours.  STUDIES: Dg Chest 2 View  09/05/2015   CLINICAL DATA:  Lightheaded dizzy shortness breath chest pain  EXAM: CHEST  2 VIEW  COMPARISON:  01/28/2015  FINDINGS: The heart size and mediastinal contours are within normal limits. Both lungs are clear. The visualized skeletal structures are unremarkable.  IMPRESSION: No active cardiopulmonary disease.   Electronically Signed   By: Skipper Cliche M.D.   On: 09/05/2015 16:33   Ct Head Wo Contrast  09/05/2015   CLINICAL DATA:  54 year old with acute onset of generalized weakness, lightheadedness and dizziness earlier today. Current history of epilepsy.  EXAM: CT HEAD WITHOUT CONTRAST  TECHNIQUE: Contiguous axial images were obtained from the base of the skull through the vertex without intravenous contrast.  COMPARISON:  07/18/2010.  FINDINGS: Artifact causes a generalized green this of the images, particularly on the  posterior fossa or the artifact is accentuated by beam hardening artifact from the patient's dental work.  Moderate cortical, deep and cerebellar atrophy, unchanged. No mass lesion. No midline shift. No acute hemorrhage or hematoma. No extra-axial fluid collections. No evidence of acute infarction.  Small mucous retention cyst or polyp in the right maxillary sinus. Small air-fluid level in the left maxillary sinus. Opacification of scattered bilateral middle ethmoid air cells. Bilateral paranasal sinuses in their cavities well aerated.  IMPRESSION: 1. No acute intracranial abnormality. 2. Moderate generalized atrophy. 3. Mild chronic right frontal sinus disease. Possible acute mild left maxillary sinus disease.   Electronically Signed   By: Evangeline Dakin M.D.   On: 09/05/2015 16:55       Impression / Plan:   1. Weight loss. Suspect this is multifactorial given his history of CKD/medications with side effects/electrolyte derangements/and social situation. Seems at times he may not have enough food at home to eat. Would recommend social services consult. He has never had a colonoscopy or EGD in the past and would likely would benefit from luminal evaluation. Will discuss with Dr Gustavo Lah as to timing, do suspect this can be arranged as an outpatient: noted his cardiac evaluation has suggested some further evaluations to include HIV testing and is evaluated for rhabdomyolisis v. True cardiac injury d/t > troponins. Patient does also have some unusual behavior at times, may benefit from RPR test,  as well as a psych consult.  Thank you very much for this consult. These services were provided by Stephens November, NP-C, in collaboration with Lollie Sails, MD, with whom I have discussed this patient in full.   Stephens November, NP-C  Addendum: did discuss further with Dr Gustavo Lah- recommend CT chest abdomen pelvis to evaluate for occult masses.

## 2015-09-06 NOTE — Progress Notes (Signed)
*  PRELIMINARY RESULTS* Echocardiogram 2D Echocardiogram has been performed.  [name] 09/06/2015, 2:15 PM

## 2015-09-06 NOTE — Progress Notes (Signed)
PT Cancellation Note  Patient Details Name: [name] MRN: [id number] DOB: [birth year]   Cancelled Treatment:    Reason Eval/Treat Not Completed: Medical issues which prohibited therapy (Pt with elevated troponin and is pending cardiology consult; will hold PT until cardiology able to assess pt and pt is appropriate to participate in PT)   Leitha Bleak 09/06/2015, 8:31 AM Leitha Bleak, [place]

## 2015-09-06 NOTE — Evaluation (Signed)
Physical Therapy Evaluation Patient Details Name: [name] MRN: [id number] DOB: [birth year] Today's Date: 09/06/2015   History of Present Illness  Pt is a 54 y.o. male presenting to hospital with weakness, fatigue, and s/p fall at home.  Pt admitted with elevated troponin (per cardiology, likely demand ischemia), hyponatremia, and hypokalemia.  PMH includes: severe hearing loss, epilepsy, htn, renal insufficiency.  Clinical Impression  Currently pt demonstrates impairments with balance, safety, strength, and limitations with functional mobility.  Prior to admission, pt reports being independent with functional mobility without AD and denies any falls (other than most recent one).  Pt lives alone with his cat in 1 level home with 3 steps to enter with R railing.  Currently pt requires 2 assist with transfers and ambulation for balance and safety (pt refusing RW but was grabbing for items for balance and then refused to let go of the IV pole with both hands); pt appearing impulsive during session and reports he "walks fast".  Pt initially very ataxic with scissoring gait but the scissoring decreased with distance.  Pt would benefit from skilled PT to address above noted impairments and functional limitations.  Recommend pt discharge to STR when medically appropriate.     Follow Up Recommendations SNF    Equipment Recommendations  Other (comment) (To be determined)    Recommendations for Other Services       Precautions / Restrictions Precautions Precautions: Fall Restrictions Weight Bearing Restrictions: No      Mobility  Bed Mobility Overal bed mobility: Needs Assistance Bed Mobility: Supine to Sit;Sit to Supine     Supine to sit: Min assist;Mod assist;HOB elevated (assist for trunk) Sit to supine: Min assist;Mod assist;HOB elevated (assist for trunk)   General bed mobility comments: pt required vc's for safety d/t impulsivity  Transfers Overall transfer level: Needs  assistance Equipment used: None (pt refused RW) Transfers: Sit to/from Stand Sit to Stand: Min assist;Mod assist;+2 physical assistance;+2 safety/equipment         General transfer comment: vc's required for safety and assist required to steady pt (pt grabbing for objects to hold onto but refused walker)  Ambulation/Gait Ambulation/Gait assistance: Min assist;Mod assist;+2 physical assistance;+2 safety/equipment Ambulation Distance (Feet): 120 Feet Assistive device:  (pt refused RW but grabbed onto IV pole with B hands and refused to let go of it)   Gait velocity: decreased   General Gait Details: ataxic (pt initially scissoring gait but decreased with distance); flexed forward posture; decreased B step length/foot clearance/heelstrike; pt requiring vc's for safety  Stairs            Wheelchair Mobility    Modified Rankin (Stroke Patients Only)       Balance Overall balance assessment: Needs assistance Sitting-balance support: Bilateral upper extremity supported;Feet supported Sitting balance-Leahy Scale: Fair     Standing balance support: Bilateral upper extremity supported (on IV pole) Standing balance-Leahy Scale: Poor                               Pertinent Vitals/Pain Pain Assessment: No/denies pain  Vitals stable and WFL throughout treatment session.    Home Living Family/patient expects to be discharged to:: Private residence Living Arrangements: Alone     Home Access: Stairs to enter Entrance Stairs-Rails: Right Entrance Stairs-Number of Steps: 3 Home Layout: One level Home Equipment: None      Prior Function Level of Independence: Independent  Comments: Pt reports being independent and denies any other falls other than most recent fall.     Hand Dominance        Extremity/Trunk Assessment   Upper Extremity Assessment: Generalized weakness (pt impulsive and was difficult to fully assess UE's)           Lower  Extremity Assessment: Generalized weakness (pt impulsive and was difficult to fully assess LE's)         Communication   Communication: HOH  Cognition Arousal/Alertness: Awake/alert Behavior During Therapy: Impulsive Overall Cognitive Status: Difficult to assess (pt requiring redirection and did not answer questions fully to assess cognitive status)                      General Comments   Nursing cleared pt for participation in physical therapy.  Pt agreeable to PT session and reports he likes to be called "[name]".    Exercises        Assessment/Plan    PT Assessment Patient needs continued PT services  PT Diagnosis Abnormality of gait;Difficulty walking;Generalized weakness   PT Problem List Decreased strength;Decreased activity tolerance;Decreased balance;Decreased mobility;Decreased knowledge of use of DME;Decreased safety awareness;Decreased knowledge of precautions  PT Treatment Interventions DME instruction;Gait training;Stair training;Functional mobility training;Therapeutic activities;Therapeutic exercise;Balance training;Patient/family education   PT Goals (Current goals can be found in the Care Plan section) Acute Rehab PT Goals Patient Stated Goal: To go home PT Goal Formulation: With patient Time For Goal Achievement: 09/20/15 Potential to Achieve Goals: Fair    Frequency Min 2X/week   Barriers to discharge Decreased caregiver support      Co-evaluation               End of Session Equipment Utilized During Treatment: Gait belt Activity Tolerance: Patient tolerated treatment well Patient left: in bed;with call bell/phone within reach;with bed alarm set Nurse Communication: Mobility status;Precautions         Time: 1150-1210 PT Time Calculation (min) (ACUTE ONLY): 20 min   Charges:   PT Evaluation $Initial PT Evaluation Tier I: 1 Procedure     PT G CodesLeitha Bleak 2015-09-20, 2:26 PM Leitha Bleak,  [place]

## 2015-09-06 NOTE — Consult Note (Signed)
Patient seen and examined chart reviewed. Please see full GI consult by Mrs. London. Agree with recommendations.  In regards to GI issues his main symptoms is that of heartburn. He has not been on a proton pump inhibitor or other acid suppression medication home. He denies any nausea or vomiting. There is no dysphagia. He has no abdominal pain. He states his bowel habits are variable although the relates this as being due to a very high intake of "cheddar cheese". He denies any black stool blood in the stool or slimy stools. He does state he's had about a 30 pound weight loss over 3 months. There is a very marked tremor in the extremities.  Agree with social service and psychiatry consult but would further recommend a possible neurology consult to this rather marked tremor as well as the findings of nystagmus and disordered conjugate gaze. Agree with CT scan chest abdomen and pelvis. Further evaluation could be arranged as outpatient if luminal evaluation is needed.  We'll follow with you.

## 2015-09-07 DIAGNOSIS — E43 Unspecified severe protein-calorie malnutrition: Secondary | ICD-10-CM | POA: Insufficient documentation

## 2015-09-07 LAB — BASIC METABOLIC PANEL
Anion gap: 9 (ref 5–15)
BUN: 17 mg/dL (ref 6–20)
CALCIUM: 8.4 mg/dL — AB (ref 8.9–10.3)
CO2: 20 mmol/L — ABNORMAL LOW (ref 22–32)
Chloride: 100 mmol/L — ABNORMAL LOW (ref 101–111)
Creatinine, Ser: 1.23 mg/dL (ref 0.61–1.24)
GFR calc Af Amer: >60 mL/min (ref >60)
GLUCOSE: 71 mg/dL (ref 65–99)
POTASSIUM: 4.7 mmol/L (ref 3.5–5.1)
Sodium: 129 mmol/L — ABNORMAL LOW (ref 135–145)

## 2015-09-07 LAB — MAGNESIUM: MAGNESIUM: 2.8 mg/dL — AB (ref 1.7–2.4)

## 2015-09-07 LAB — SODIUM: Sodium: 128 mmol/L — ABNORMAL LOW (ref 135–145)

## 2015-09-07 MED ORDER — PANTOPRAZOLE SODIUM 40 MG PO TBEC: 40.0000 mg | DELAYED_RELEASE_TABLET | Freq: Every day | ORAL | Status: DC

## 2015-09-07 NOTE — Care Management (Signed)
Patient is for discharge today and is now agreeable to have home health nurse, aide, social work and front wheeled rolling walker.  Advanced assessed for charity care.  Accepted referral.

## 2015-09-07 NOTE — Discharge Summary (Signed)
[name] at [place] NAME: [name]    MR#:  [id number]  DATE OF BIRTH:  28-Aug-1961  DATE OF ADMISSION:  09/05/2015 ADMITTING PHYSICIAN: Theodoro Grist, MD  DATE OF DISCHARGE: 09/07/2015  PRIMARY CARE PHYSICIAN: [name], MD    ADMISSION DIAGNOSIS:  Hypokalemia [E87.6] Hyponatremia [E87.1]  DISCHARGE DIAGNOSIS:  Acute hyponatremia suspected due to dehydration Acute renal failure-resolved  SECONDARY DIAGNOSIS:   Past Medical History  Diagnosis Date  . Hypertension   . Seizures   . Kidney disease   . Anemia   . Migraine     HOSPITAL COURSE:  54 y.o. male with a known history of severe hearing loss, epilepsy, hypertension, renal insufficiency, chronic, who presents to the hospital with complaints of fall. E patient has been having fatigue and weakness since Thursday last week   1. Hyponatremia, likely dehydration related.  patient reports weight loss and poor appetite initiated patient on IV fluids as follows sodium level in the morning -NA 118--->121-->126-->129 -received IVF NS  2. Hypokalemia. repelted  3. Generalized weakness, get physical therapy involved -PT recommends rehab. Pt declines it. Will try arrange HHPT   4. Weight loss due to unknown etiology, initiate patient on PPI, Zofran IV fluids -reports eating ok here -seen by GI. No indications for luminal eval. Will defer to PCP to monitor weight loss and furhter w/u as out pt if needed  5. Elevated troponin. Mild, no cp -pt has outpt cardiology visit in July. No cardiac history -EKG no acute changes -d/c nitrobid  6.chronic seizure disorder -cont tegretol and depakote  7. H/o impaired hearing  D/c home with HHPT D/w CM   CONSULTS OBTAINED:  Treatment Team:  Minna Merritts, MD Lollie Sails, MD  DRUG ALLERGIES:  No Known Allergies  DISCHARGE MEDICATIONS:   Current Discharge Medication List    START taking these medications    Details  pantoprazole (PROTONIX) 40 MG tablet Take 1 tablet (40 mg total) by mouth daily. Qty: 30 tablet, Refills: 2      CONTINUE these medications which have NOT CHANGED   Details  amLODipine (NORVASC) 5 MG tablet Take 5 mg by mouth daily.    butalbital-acetaminophen-caffeine (FIORICET, ESGIC) 50-325-40 MG per tablet Take 1-2 tablets by mouth every 4 (four) hours as needed for migraine.    carbamazepine (TEGRETOL) 200 MG tablet Take 200-400 mg by mouth 3 (three) times daily. Pt takes two in the morning, one at noon, and two at bedtime.    divalproex (DEPAKOTE) 500 MG DR tablet Take 500-1,000 mg by mouth 3 (three) times daily. Pt takes two in the morning, one at noon, and two at bedtime.    lisinopril (PRINIVIL,ZESTRIL) 20 MG tablet Take 20 mg by mouth daily.    propranolol (INDERAL) 20 MG tablet Take 40 mg by mouth 2 (two) times daily.        If you experience worsening of your admission symptoms, develop shortness of breath, life threatening emergency, suicidal or homicidal thoughts you must seek medical attention immediately by calling 911 or calling your MD immediately  if symptoms less severe.  You Must read complete instructions/literature along with all the possible adverse reactions/side effects for all the Medicines you take and that have been prescribed to you. Take any new Medicines after you have completely understood and accept all the possible adverse reactions/side effects.   Please note  You were cared for by a hospitalist during your hospital stay. If  you have any questions about your discharge medications or the care you received while you were in the hospital after you are discharged, you can call the unit and asked to speak with the hospitalist on call if the hospitalist that took care of you is not available. Once you are discharged, your primary care physician will handle any further medical issues. Please note that NO REFILLS for any discharge medications will  be authorized once you are discharged, as it is imperative that you return to your primary care physician (or establish a relationship with a primary care physician if you do not have one) for your aftercare needs so that they can reassess your need for medications and monitor your lab values. Today   SUBJECTIVE   No complaints. Wants to go home  VITAL SIGNS:  Blood pressure 132/79, pulse 61, temperature 98.1 F (36.7 C), temperature source Oral, resp. rate 18, height 5\' 3"  (1.6 m), weight 60.011 kg (132 lb 4.8 oz), SpO2 99 %.  I/O:   Intake/Output Summary (Last 24 hours) at 09/07/15 0919 Last data filed at 09/07/15 0700  Gross per 24 hour  Intake 661.25 ml  Output   2450 ml  Net -1788.75 ml    PHYSICAL EXAMINATION:  GENERAL:  54 y.o.-year-old patient lying in the bed with no acute distress.  EYES: Pupils equal, round, reactive to light and accommodation. No scleral icterus. Extraocular muscles intact.  HEENT: Head atraumatic, normocephalic. Oropharynx and nasopharynx clear.  NECK:  Supple, no jugular venous distention. No thyroid enlargement, no tenderness.  LUNGS: Normal breath sounds bilaterally, no wheezing, rales,rhonchi or crepitation. No use of accessory muscles of respiration.  CARDIOVASCULAR: S1, S2 normal. No murmurs, rubs, or gallops.  ABDOMEN: Soft, non-tender, non-distended. Bowel sounds present. No organomegaly or mass.  EXTREMITIES: No pedal edema, cyanosis, or clubbing.  NEUROLOGIC: Cranial nerves II through XII are intact. Muscle strength 5/5 in all extremities. Sensation intact. Gait not checked.  PSYCHIATRIC: The patient is alert and oriented x 3.  SKIN: No obvious rash, lesion, or ulcer.   DATA REVIEW:   CBC   Recent Labs Lab 09/06/15 0632  WBC 4.3  HGB 11.2*  HCT 31.3*  PLT 166    Chemistries   Recent Labs Lab 09/07/15 0417  NA 129*  K 4.7  CL 100*  CO2 20*  GLUCOSE 71  BUN 17  CREATININE 1.23  CALCIUM 8.4*  MG 2.8*    Microbiology  Results   No results found for this or any previous visit (from the past 240 hour(s)).  RADIOLOGY:  Dg Chest 2 View  09/05/2015   CLINICAL DATA:  Lightheaded dizzy shortness breath chest pain  EXAM: CHEST  2 VIEW  COMPARISON:  01/28/2015  FINDINGS: The heart size and mediastinal contours are within normal limits. Both lungs are clear. The visualized skeletal structures are unremarkable.  IMPRESSION: No active cardiopulmonary disease.   Electronically Signed   By: Skipper Cliche M.D.   On: 09/05/2015 16:33   Ct Head Wo Contrast  09/05/2015   CLINICAL DATA:  54 year old with acute onset of generalized weakness, lightheadedness and dizziness earlier today. Current history of epilepsy.  EXAM: CT HEAD WITHOUT CONTRAST  TECHNIQUE: Contiguous axial images were obtained from the base of the skull through the vertex without intravenous contrast.  COMPARISON:  07/18/2010.  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  Moderate cortical, deep and cerebellar atrophy, unchanged. No mass lesion. No midline shift. No acute hemorrhage or hematoma. No extra-axial fluid collections. No evidence of acute infarction.  Small mucous retention cyst or polyp in the right maxillary sinus. Small air-fluid level in the left maxillary sinus. Opacification of scattered bilateral middle ethmoid air cells. Bilateral paranasal sinuses in their cavities well aerated.  IMPRESSION: 1. No acute intracranial abnormality. 2. Moderate generalized atrophy. 3. Mild chronic right frontal sinus disease. Possible acute mild left maxillary sinus disease.   Electronically Signed   By: Evangeline Dakin M.D.   On: 09/05/2015 16:55     Management plans discussed with the patient, family and they are in agreement.  CODE STATUS:     Code Status Orders        Start     Ordered   09/06/15 0027  Full  code   Continuous     09/06/15 0026      TOTAL TIME TAKING CARE OF THIS PATIENT: 40 minutes.    PATEL,SONA M.D on 09/07/2015 at 9:19 AM  Between 7am to 6pm - Pager - [phone] After 6pm go to [email or web address] - password [email or web address]  Office  [phone]  CC: Primary care physician; [name], MD

## 2015-09-07 NOTE — Progress Notes (Signed)
Removed telemetry and removed iv.  Rx called into pharmacy.  No questions at this time.  Patient escorted out of hospital by nursing.    Walker provided and advanced home health set up.

## 2015-09-07 NOTE — Progress Notes (Signed)
PT Cancellation Note  Patient Details Name: [name] MRN: [id number] DOB: [birth year]   Cancelled Treatment:    Reason Eval/Treat Not Completed: Other (comment) (Patient on his way out of the building when PT attempted. ) PT checked on patient's room, when no one was in the room PT called RN who stated patient was being escorted out of the building for discharge.    Kerman Passey, PT, DPT    09/07/2015, 2:53 PM

## 2015-09-07 NOTE — Clinical Social Work Note (Signed)
Clinical Social Work Assessment  Patient Details  Name: [name] MRN: [id number] Date of Birth: July 29, 1961  Date of referral:  09/07/15               Reason for consult:  Abuse/Neglect                Permission sought to share information with:    Permission granted to share information::  Yes, Verbal Permission Granted  Name::     Loney Laurence Friend  Agency::     Relationship::     Contact Information:     Housing/Transportation Living arrangements for the past 2 months:  Single Family Home Source of Information:  Patient, Friend/Neighbor Patient Interpreter Needed:  None Criminal Activity/Legal Involvement Pertinent to Current Situation/Hospitalization:  No - Comment as needed Significant Relationships:  Friend Lives with:  Self Do you feel safe going back to the place where you live?  Yes Need for family participation in patient care:  Yes (Comment)  Care giving concerns:  Current concerns are pt being able to complete his ADL's and his capacity to make decisions for himself.  Currently pt lives alone.  CSW was able to verify that pt's friend does check on pt at least once a week.  CSW also verified that home health was being set up for pt through charity care including a social worker to go and visit the pt.     Social Worker assessment / plan:  CSW spoke to pt.  He was oriented however it was difficult for him to stay on topic when speaking to the CSW about DC options.  Because pt does not have insurance he would need to go to a SNF facility out of the [place].  However pt was not willing to DC to a SNF within [facility], or a SNF facility at all.  CSW was given permission to speak to pt's friend Fara Olden [phone].  CSW was able to confirm that pt did live in a condo that he purchased.  His electric and water are currently under [place] name.  Joels confirms that pt is able to get food, and does have electricity and water.  Pt's friend also confirmed that pt is transgender.  CSW  is confident that pt will benigit from home Health being set up for him.  Employment status:  Disabled (Comment on whether or not currently receiving Disability) Insurance information:  Other (Comment Required) (uninsured) PT Recommendations:  [place] (pt refused.) Information / Referral to community resources:     Patient/Family's Response to care:  Pt is now in agreement with home health coming to see him.  Patient/Family's Understanding of and Emotional Response to Diagnosis, Current Treatment, and Prognosis:  Pt verbalized his understanding of this and was in agreement with home health coming to him.    Emotional Assessment Appearance:  Appears older than stated age Attitude/Demeanor/Rapport:  Inconsistent Affect (typically observed):  Guarded Orientation:  Oriented to Self, Oriented to Place, Oriented to  Time, Oriented to Situation Alcohol / Substance use:    Psych involvement (Current and /or in the community):  No (Comment)  Discharge Needs  Concerns to be addressed:  Lack of Support, Cognitive Concerns, Patient refuses services, Other (Comment Required (possible self neglect) Readmission within the last 30 days:  No Current discharge risk:  Physical Impairment Barriers to Discharge:  Inadequate or no insurance   Mathews Argyle, LCSW 09/07/2015, 1:38 PM

## 2015-09-08 LAB — RPR: RPR: NONREACTIVE

## 2015-09-08 NOTE — Clinical Social Work Note (Signed)
CSW attempted to contact DSS for APS report, but lines were busy.   CSW gave Education officer, museum at [employer] contact information to contact [place] at hospital when she was available to take an APS report.

## 2015-09-08 NOTE — Care Management (Signed)
Late entry- Faxed physical therapy referral to [facility]. Discussed APS referral with CSW on 9/21 in the event that patient does not agree to accept services he agreed to receive at discharge

## 2015-10-09 ENCOUNTER — Emergency Department: Payer: Self-pay

## 2015-10-09 ENCOUNTER — Emergency Department
Admission: EM | Admit: 2015-10-09 | Discharge: 2015-10-09 | Disposition: A | Discharge status: Home / Self Care | Payer: Self-pay | Attending: Emergency Medicine | Admitting: Emergency Medicine

## 2015-10-09 DIAGNOSIS — Y9389 Activity, other specified: Secondary | ICD-10-CM | POA: Insufficient documentation

## 2015-10-09 DIAGNOSIS — W1839XA Other fall on same level, initial encounter: Secondary | ICD-10-CM | POA: Insufficient documentation

## 2015-10-09 DIAGNOSIS — S7001XA Contusion of right hip, initial encounter: Secondary | ICD-10-CM | POA: Insufficient documentation

## 2015-10-09 DIAGNOSIS — I10 Essential (primary) hypertension: Secondary | ICD-10-CM | POA: Insufficient documentation

## 2015-10-09 DIAGNOSIS — E876 Hypokalemia: Secondary | ICD-10-CM | POA: Insufficient documentation

## 2015-10-09 DIAGNOSIS — E871 Hypo-osmolality and hyponatremia: Secondary | ICD-10-CM | POA: Insufficient documentation

## 2015-10-09 DIAGNOSIS — S2232XA Fracture of one rib, left side, initial encounter for closed fracture: Secondary | ICD-10-CM | POA: Insufficient documentation

## 2015-10-09 DIAGNOSIS — Y998 Other external cause status: Secondary | ICD-10-CM | POA: Insufficient documentation

## 2015-10-09 DIAGNOSIS — Z79899 Other long term (current) drug therapy: Secondary | ICD-10-CM | POA: Insufficient documentation

## 2015-10-09 DIAGNOSIS — S0990XA Unspecified injury of head, initial encounter: Secondary | ICD-10-CM | POA: Insufficient documentation

## 2015-10-09 DIAGNOSIS — S0003XA Contusion of scalp, initial encounter: Secondary | ICD-10-CM | POA: Insufficient documentation

## 2015-10-09 DIAGNOSIS — Y92010 Kitchen of single-family (private) house as the place of occurrence of the external cause: Secondary | ICD-10-CM | POA: Insufficient documentation

## 2015-10-09 LAB — CBC WITH DIFFERENTIAL/PLATELET
BASOS ABS: 0 10*3/uL (ref 0–0.1)
Basophils Relative: 0 %
EOS ABS: 0 10*3/uL (ref 0–0.7)
EOS PCT: 0 %
HCT: 27.1 % — ABNORMAL LOW (ref 40.0–52.0)
HEMOGLOBIN: 9.7 g/dL — AB (ref 13.0–18.0)
LYMPHS ABS: 0.4 10*3/uL — AB (ref 1.0–3.6)
LYMPHS PCT: 8 %
MCH: 34 pg (ref 26.0–34.0)
MCHC: 35.7 g/dL (ref 32.0–36.0)
MCV: 95.2 fL (ref 80.0–100.0)
Monocytes Absolute: 0.5 10*3/uL (ref 0.2–1.0)
Monocytes Relative: 10 %
NEUTROS PCT: 82 %
Neutro Abs: 4.2 10*3/uL (ref 1.4–6.5)
PLATELETS: 112 10*3/uL — AB (ref 150–440)
RBC: 2.85 MIL/uL — AB (ref 4.40–5.90)
RDW: 14.8 % — ABNORMAL HIGH (ref 11.5–14.5)
WBC: 5.1 10*3/uL (ref 3.8–10.6)

## 2015-10-09 LAB — URINALYSIS COMPLETE WITH MICROSCOPIC (ARMC ONLY)
Bacteria, UA: NONE SEEN
Bilirubin Urine: NEGATIVE
GLUCOSE, UA: NEGATIVE mg/dL
Hgb urine dipstick: NEGATIVE
Leukocytes, UA: NEGATIVE
Nitrite: NEGATIVE
PROTEIN: NEGATIVE mg/dL
Specific Gravity, Urine: 1.014 (ref 1.005–1.030)
Squamous Epithelial / LPF: NONE SEEN
pH: 6 (ref 5.0–8.0)

## 2015-10-09 LAB — TROPONIN I: Troponin I: 0.05 ng/mL — ABNORMAL HIGH (ref <0.031)

## 2015-10-09 LAB — COMPREHENSIVE METABOLIC PANEL
ALK PHOS: 58 U/L (ref 38–126)
ALT: 39 U/L (ref 17–63)
AST: 84 U/L — AB (ref 15–41)
Albumin: 4.2 g/dL (ref 3.5–5.0)
Anion gap: 15 (ref 5–15)
BUN: 32 mg/dL — ABNORMAL HIGH (ref 6–20)
CALCIUM: 10 mg/dL (ref 8.9–10.3)
CO2: 25 mmol/L (ref 22–32)
CREATININE: 1.25 mg/dL — AB (ref 0.61–1.24)
Chloride: 86 mmol/L — ABNORMAL LOW (ref 101–111)
GFR calc Af Amer: >60 mL/min (ref >60)
GFR calc non Af Amer: >60 mL/min (ref >60)
Glucose, Bld: 101 mg/dL — ABNORMAL HIGH (ref 65–99)
Potassium: 2.8 mmol/L — CL (ref 3.5–5.1)
SODIUM: 126 mmol/L — AB (ref 135–145)
TOTAL PROTEIN: 7.8 g/dL (ref 6.5–8.1)
Total Bilirubin: 0.9 mg/dL (ref 0.3–1.2)

## 2015-10-09 LAB — CARBAMAZEPINE LEVEL, TOTAL: CARBAMAZEPINE LVL: 6.8 ug/mL (ref 4.0–12.0)

## 2015-10-09 LAB — VALPROIC ACID LEVEL: VALPROIC ACID LVL: 107 ug/mL — AB (ref 50.0–100.0)

## 2015-10-09 MED ORDER — IBUPROFEN 600 MG PO TABS
600.0000 mg | ORAL_TABLET | Freq: Once | ORAL | Status: AC
  Administered 2015-10-09: 600 mg via ORAL
  Filled 2015-10-09: qty 1

## 2015-10-09 MED ORDER — POTASSIUM CHLORIDE 10 MEQ/100ML IV SOLN
10.0000 meq | Freq: Once | INTRAVENOUS | Status: AC
  Administered 2015-10-09: 10 meq via INTRAVENOUS
  Filled 2015-10-09: qty 100

## 2015-10-09 MED ORDER — SODIUM CHLORIDE 0.9 % IV BOLUS (SEPSIS)
1000.0000 mL | Freq: Once | INTRAVENOUS | Status: AC
  Administered 2015-10-09: 1000 mL via INTRAVENOUS

## 2015-10-09 MED ORDER — OXYCODONE HCL 5 MG PO TABS
5.0000 mg | ORAL_TABLET | Freq: Once | ORAL | Status: AC
Start: 2015-10-09 — End: 2015-10-09
  Administered 2015-10-09: 5 mg via ORAL
  Filled 2015-10-09: qty 1

## 2015-10-09 MED ORDER — DIVALPROEX SODIUM 500 MG PO DR TAB
500.0000 mg | DELAYED_RELEASE_TABLET | Freq: Once | ORAL | Status: AC
  Administered 2015-10-09: 500 mg via ORAL
  Filled 2015-10-09: qty 1

## 2015-10-09 MED ORDER — IBUPROFEN 600 MG PO TABS
600.0000 mg | ORAL_TABLET | Freq: Three times a day (TID) | ORAL | Status: DC | PRN
Start: 2015-10-09 — End: 2015-10-25

## 2015-10-09 MED ORDER — POTASSIUM CHLORIDE CRYS ER 20 MEQ PO TBCR
40.0000 meq | EXTENDED_RELEASE_TABLET | Freq: Once | ORAL | Status: AC
  Administered 2015-10-09: 40 meq via ORAL
  Filled 2015-10-09: qty 2

## 2015-10-09 MED ORDER — CARBAMAZEPINE 200 MG PO TABS
200.0000 mg | ORAL_TABLET | Freq: Once | ORAL | Status: AC
  Administered 2015-10-09: 200 mg via ORAL
  Filled 2015-10-09: qty 1

## 2015-10-09 NOTE — ED Notes (Signed)
Pt given sprite per request. Pt will not eat any food. Awaiting EMS transport. Pt aware.

## 2015-10-09 NOTE — ED Provider Notes (Addendum)
[facility] [place]  I accepted care from [facility], NP   Patient states he uses a walker typically walk. He did trip last night, and he was able to go back to bed. However due to bilateral rib pain and he was able to sleep much last night. He doesn't think that he has seizure. He is unclear whether he hit his head.  He does have a history of falls in the past. He states is mainly here for evaluation due to pain on his ribs. No shortness breath or trouble breathing.  He rates the pain as moderate. It's worse with palpation and movement.    Physical exam  HEENT: Small ecchymosis right frontal scalp and top of the head. Extraocular movements intact. Neck: Nontender C-spine Cardiovascular: Regular rate and rhythm without murmurs rubs or gallops. Lungs: Clear to auscultation bilaterally. No wheezes rhonchi or rales. Abdomen: Nontender abdomen. Soft. Musculoskeletal: Nontender extremities. Stable pelvis. Moderate to severe chest tenderness to palpation over the lower rib margin on the left and slightly tender to palpation on the right. Skin: Warm dry and intact. Neurologic: Resting tremor bilateral hands. Alert and cooperative. No focal weakness or numbness Psychiatric: Unusual personality, however no psychosis. No depression or suicidal ideation.  ____________________________________________    LABS (pertinent positives/negatives)  Valproate 107 Troponin 0.05 Comprehensive metabolic panel significant for sodium 126, potassium 2.8, chloride 86, BUN 32 and creatinine 1.5 and otherwise without significant abnormality White blood cell count 5.1 with no left shift. Hemoglobin 9.7 and platelet count 112 Carbamazepine level VI.8    ECG: Interpreted by myself attending physician Dr. Lisa Roca 83 bpm. Normal sinus rhythm. Narrow QRS. Normal axis. Nonspecific ST and T-wave. ____________________________________________    RADIOLOGY All xrays were viewed by me. Imaging  interpreted by radiologist.  Chest x-ray 2 view:  IMPRESSION: Findings suspicious for left inferior rib fractures at approximately the left eighth and ninth ribs. Mild soft tissue swelling in this region.  No effusion or pneumothorax.  CT head noncontrast:  IMPRESSION: No acute intracranial abnormalities.  Generalized atrophy. ____________________________________________   PROCEDURES  Procedure(s) performed: None  Critical Care performed: None  ____________________________________________   INITIAL IMPRESSION / ASSESSMENT AND PLAN / ED COURSE  CONSULTATIONS: none  Pertinent labs & imaging results that were available during my care of the patient were reviewed by me and considered in my medical decision making (see chart for details).  This patient appears to be at his baseline health, which includes a baseline tremor, as well as trouble walking for which uses a walker. I've seen him before and he seems to be at his baseline mental status. Patient is alert and states he does not want to do rehabilitation or nursing care, and wants to return home. His multiple laboratory abnormalities appear to be chronic including chronic hyponatremia and chronic minimally elevated troponin.    I added a CT of the head given the ecchymosis on his forehead and the top of his head.  Patient was given potassium repletion here in the emergency department. No vomiting or diarrhea to suggest fluid loss as a source of the hypokalemia.  Patient able to walk with help/walker, and as patient is at his baseline, he is stable to be discharged home. He was instructed to call his primary care physician this week.  I've asked him to treat his rib fracture with ibuprofen, and avoid any narcotics due to my concern about his underlying instability. I do want to add anything that may make him more likely  or prone to falls. He was discharged with an iincentive spirometer to help prevent pneumonia.  Patient's  friend him typically transport him, is unable to pick up today. He will go home by nonemergency transport so that he is able be helped back into his home where he has his walker. Patient was given his daily noontime doses of antiseizure medications here in the emergency department.  Patient / Family / Caregiver informed of clinical course, medical decision-making process, and agree with plan.   I discussed return precautions, follow-up instructions, and discharged instructions with patient and/or family.  ____________________________________________   FINAL CLINICAL IMPRESSION(S) / ED DIAGNOSES  Final diagnoses:  Chronic hyponatremia  Hypokalemia  Rib fractures, left, closed, initial encounter  Scalp hematoma, initial encounter     FOLLOW UP   Referred [id number] Care Physician  Lisa Roca, MD 10/09/15 [place], MD 10/09/15 [place], MD 10/09/15 1150

## 2015-10-09 NOTE — Discharge Instructions (Signed)
You were evaluated after a fall yesterday, and found to have several left rib fractures.  Use her incentive spirometer to make sure you take deep breaths to help prevent pneumonia.    Return to the emergency department for any worsening pain, weakness, numbness, trouble breathing, confusion or altered mental status, dizziness or passing out, or any other symptoms concerning to you.   Contusion A contusion is a deep bruise. Contusions happen when an injury causes bleeding under the skin. Symptoms of bruising include pain, swelling, and discolored skin. The skin may turn blue, purple, or yellow. HOME CARE   Rest the injured area.  If told, put ice on the injured area.  Put ice in a plastic bag.  Place a towel between your skin and the bag.  Leave the ice on for 20 minutes, 2-3 times per day.  If told, put light pressure (compression) on the injured area using an elastic bandage. Make sure the bandage is not too tight. Remove it and put it back on as told by your doctor.  If possible, raise (elevate) the injured area above the level of your heart while you are sitting or lying down.  Take over-the-counter and prescription medicines only as told by your doctor. GET HELP IF:  Your symptoms do not get better after several days of treatment.  Your symptoms get worse.  You have trouble moving the injured area. GET HELP RIGHT AWAY IF:   You have very bad pain.  You have a loss of feeling (numbness) in a hand or foot.  Your hand or foot turns pale or cold.   This information is not intended to replace advice given to you by your health care provider. Make sure you discuss any questions you have with your health care provider.   Document Released: 05/21/2008 Document Revised: 08/24/2015 Document Reviewed: 04/20/2015 Elsevier Interactive Patient Education 2016 [employer].  Rib Fracture A rib fracture is a break or crack in one of the bones of the ribs. The ribs are a group of  long, curved bones that wrap around your chest and attach to your spine. They protect your lungs and other organs in the chest cavity. A broken or cracked rib is often painful, but most do not cause other problems. Most rib fractures heal on their own over time. However, rib fractures can be more serious if multiple ribs are broken or if broken ribs move out of place and push against other structures. CAUSES   A direct blow to the chest. For example, this could happen during contact sports, a car accident, or a fall against a hard object.  Repetitive movements with high force, such as pitching a baseball or having severe coughing spells. SYMPTOMS   Pain when you breathe in or cough.  Pain when someone presses on the injured area. DIAGNOSIS  Your caregiver will perform a physical exam. Various imaging tests may be ordered to confirm the diagnosis and to look for related injuries. These tests may include a chest X-ray, computed tomography (CT), magnetic resonance imaging (MRI), or a bone scan. TREATMENT  Rib fractures usually heal on their own in 1-3 months. The longer healing period is often associated with a continued cough or other aggravating activities. During the healing period, pain control is very important. Medication is usually given to control pain. Hospitalization or surgery may be needed for more severe injuries, such as those in which multiple ribs are broken or the ribs have moved out of place.  HOME  CARE INSTRUCTIONS   Avoid strenuous activity and any activities or movements that cause pain. Be careful during activities and avoid bumping the injured rib.  Gradually increase activity as directed by your caregiver.  Only take over-the-counter or prescription medications as directed by your caregiver. Do not take other medications without asking your caregiver first.  Apply ice to the injured area for the first 1-2 days after you have been treated or as directed by your caregiver.  Applying ice helps to reduce inflammation and pain.  Put ice in a plastic bag.  Place a towel between your skin and the bag.   Leave the ice on for 15-20 minutes at a time, every 2 hours while you are awake.  Perform deep breathing as directed by your caregiver. This will help prevent pneumonia, which is a common complication of a broken rib. Your caregiver may instruct you to:  Take deep breaths several times a day.  Try to cough several times a day, holding a pillow against the injured area.  Use a device called an incentive spirometer to practice deep breathing several times a day.  Drink enough fluids to keep your urine clear or pale yellow. This will help you avoid constipation.   Do not wear a rib belt or binder. These restrict breathing, which can lead to pneumonia.  SEEK IMMEDIATE MEDICAL CARE IF:   You have a fever.   You have difficulty breathing or shortness of breath.   You develop a continual cough, or you cough up thick or bloody sputum.  You feel sick to your stomach (nausea), throw up (vomit), or have abdominal pain.   You have worsening pain not controlled with medications.  MAKE SURE YOU:  Understand these instructions.  Will watch your condition.  Will get help right away if you are not doing well or get worse.   This information is not intended to replace advice given to you by your health care provider. Make sure you discuss any questions you have with your health care provider.   Document Released: 12/03/2005 Document Revised: 08/05/2013 Document Reviewed: 02/04/2013 Elsevier Interactive Patient Education 2016 [employer]  Hyponatremia Hyponatremia is when the amount of salt (sodium) in your blood is too low. When salt levels are low, your cells absorb extra water and they swell. The swelling happens throughout the body, but it mostly affects the brain.  HOME CARE  Take medicines only as told by your doctor. Many medicines can make this  condition worse. Talk with your doctor about any medicines that you are currently taking.  Carefully follow a recommended diet as told by your doctor.  Carefully follow instructions from your doctor about fluid restrictions.  Keep all follow-up visits as told by your doctor. This is important.  Do not drink alcohol. GET HELP IF:  You feel sicker to your stomach (nauseous).  You feel more confused.  You feel more tired (fatigued).  Your headache gets worse.  You feel weaker.  Your symptoms go away and then they come back.  You have trouble following the diet instructions. GET HELP RIGHT AWAY IF:  You start to twitch and shake (have a seizure).  You pass out (faint).  You keep having watery poop (diarrhea).  You keep throwing up (vomiting).   This information is not intended to replace advice given to you by your health care provider. Make sure you discuss any questions you have with your health care provider.   Document Released: 08/15/2011 Document Revised: 04/19/2015  Document Reviewed: 11/29/2014 Elsevier Interactive Patient Education 2016 [employer].  Hypokalemia Hypokalemia means that the amount of potassium in the blood is lower than normal.Potassium is a chemical, called an electrolyte, that helps regulate the amount of fluid in the body. It also stimulates muscle contraction and helps nerves function properly.Most of the body's potassium is inside of cells, and only a very small amount is in the blood. Because the amount in the blood is so small, minor changes can be life-threatening. CAUSES  Antibiotics.  Diarrhea or vomiting.  Using laxatives too much, which can cause diarrhea.  Chronic kidney disease.  Water pills (diuretics).  Eating disorders (bulimia).  Low magnesium level.  Sweating a lot. SIGNS AND SYMPTOMS  Weakness.  Constipation.  Fatigue.  Muscle cramps.  Mental confusion.  Skipped heartbeats or irregular heartbeat  (palpitations).  Tingling or numbness. DIAGNOSIS  Your health care provider can diagnose hypokalemia with blood tests. In addition to checking your potassium level, your health care provider may also check other lab tests. TREATMENT Hypokalemia can be treated with potassium supplements taken by mouth or adjustments in your current medicines. If your potassium level is very low, you may need to get potassium through a vein (IV) and be monitored in the hospital. A diet high in potassium is also helpful. Foods high in potassium are:  Nuts, such as peanuts and pistachios.  Seeds, such as sunflower seeds and pumpkin seeds.  Peas, lentils, and lima beans.  Whole grain and bran cereals and breads.  Fresh fruit and vegetables, such as apricots, avocado, bananas, cantaloupe, kiwi, oranges, tomatoes, asparagus, and potatoes.  Orange and tomato juices.  Red meats.  Fruit yogurt. HOME CARE INSTRUCTIONS  Take all medicines as prescribed by your health care provider.  Maintain a healthy diet by including nutritious food, such as fruits, vegetables, nuts, whole grains, and lean meats.  If you are taking a laxative, be sure to follow the directions on the label. SEEK MEDICAL CARE IF:  Your weakness gets worse.  You feel your heart pounding or racing.  You are vomiting or having diarrhea.  You are diabetic and having trouble keeping your blood glucose in the normal range. SEEK IMMEDIATE MEDICAL CARE IF:  You have chest pain, shortness of breath, or dizziness.  You are vomiting or having diarrhea for more than 2 days.  You faint. MAKE SURE YOU:   Understand these instructions.  Will watch your condition.  Will get help right away if you are not doing well or get worse.   This information is not intended to replace advice given to you by your health care provider. Make sure you discuss any questions you have with your health care provider.   Document Released: 12/03/2005 Document  Revised: 12/24/2014 Document Reviewed: 06/05/2013 Elsevier Interactive Patient Education [employer].

## 2015-10-09 NOTE — ED Notes (Signed)
Pt incentive spirometer given and teach back performed. Pt states that he is unable to walk without his walker. Pt does walk with one person assistance. Pt friend, Loney Laurence contacted to give patient ride home-unable to do so. Pt friend states that pt lives alone and takes care of himself with assistance from friend. Pt alert and oriented X4.

## 2015-10-09 NOTE — ED Notes (Signed)
Pt presents via EMS post fall last PM. Significant bruising noted to most of back. PA at bedside. Pt able to get back into bed last PM post fall. C/o rib pain and generalized pain.

## 2015-10-09 NOTE — ED Notes (Addendum)
Pt on toilet at this time. Call bell within reach, pt verbalizes understanding to stay on toilet until staff are back in room to help.

## 2015-10-09 NOTE — ED Notes (Signed)
Pt to CT

## 2015-10-09 NOTE — ED Notes (Signed)
Pt states that he is physically out of tegretol and depakote pills until he can get refilled tomorrow. Verbal order to give lunch time dose from Dr Reita Cliche. Pt states that he will be able to get pills picked up from pharmacy tomorrow. Pt unable to go home in taxi due to need for assistance to get into house and get to his walker. Dr Reita Cliche informed, pt to go home by EMS

## 2015-10-09 NOTE — ED Notes (Signed)
Pt unable to urinate to provide sample at this time

## 2015-10-09 NOTE — ED Notes (Signed)
MD at bedside. 

## 2015-10-09 NOTE — ED Notes (Addendum)
Pt states that he fell on the floor at his house last night. Pt states that he "passed out" and "tripped" when prompted. Pt states that he was on the floor approx 1 hour. Pt c/o pain in bilateral ribs. Bruising on bilateral knees present. Brown colored bruising to right thigh. Pt states that he hurts all over. Small abrasion to forehead, minimal. Pt denies hitting his head when he fell. Pt does not think that he had a seizure.  Pt states that his shakes are at baseline for him. No deformities present. Pt able to ambulate at baseline per his report.

## 2015-10-09 NOTE — ED Notes (Signed)
Pt continues to await EMS transport.

## 2015-10-09 NOTE — ED Notes (Signed)
Patient transported to X-ray 

## 2015-10-09 NOTE — ED Provider Notes (Signed)
[facility] Emergency Department Provider Note  ____________________________________________  Time seen: Approximately 8:09 AM  I have reviewed the triage vital signs and the nursing notes.   HISTORY  Chief Complaint Fall    HPI [name] is a 54 y.o. male presents to the emergency department for evaluation after falling last night. He reports that he has had approximately 6 falls over the past few days. He states that last night he "just collapsed to the kitchen floor while trying to cook meatballs." He states that he was able to get to his feet and go to bed. He states that he didn't sleep well due to pain and decided to come to the emergency department. EMS reports that he was able to ambulate from his bed to the living area which is where they found him on their arrival. He denies any loss of consciousness.   Past Medical History  Diagnosis Date  . Hypertension   . Seizures ([place])   . Kidney disease   . Anemia   . Migraine     Patient Active Problem List   Diagnosis Date Noted  . Protein-calorie malnutrition, severe ([place]) 09/07/2015  . Hyponatremia 09/05/2015  . Elevated troponin 09/05/2015  . Hypokalemia 09/05/2015  . Leukopenia 09/05/2015  . Renal insufficiency 09/05/2015  . Tremor, essential 07/15/2015  . Essential hypertension 07/15/2015  . Chronic renal insufficiency 07/15/2015  . Leg swelling 07/15/2015  . Chest pain 04/17/2015    History reviewed. No pertinent past surgical history.  Current Outpatient Rx  Name  Route  Sig  Dispense  Refill  . amLODipine (NORVASC) 5 MG tablet   Oral   Take 5 mg by mouth daily.         . butalbital-acetaminophen-caffeine (FIORICET, ESGIC) 50-325-40 MG per tablet   Oral   Take 1-2 tablets by mouth every 4 (four) hours as needed for migraine.         . carbamazepine (TEGRETOL) 200 MG tablet   Oral   Take 200-400 mg by mouth 3 (three) times daily. Pt takes two in the morning, one at noon,  and two at bedtime.         . divalproex (DEPAKOTE) 500 MG DR tablet   Oral   Take 500-1,000 mg by mouth 3 (three) times daily. Pt takes two in the morning, one at noon, and two at bedtime.         Marland Kitchen lisinopril (PRINIVIL,ZESTRIL) 20 MG tablet   Oral   Take 20 mg by mouth daily.         . pantoprazole (PROTONIX) 40 MG tablet   Oral   Take 1 tablet (40 mg total) by mouth daily.   30 tablet   2   . propranolol (INDERAL) 20 MG tablet   Oral   Take 40 mg by mouth 2 (two) times daily.           Allergies Review of patient's allergies indicates no known allergies.  Family History  Problem Relation Age of Onset  . Heart attack Father   . Kidney disease Father     Social History Social History  Substance Use Topics  . Smoking status: Never Smoker   . Smokeless tobacco: None  . Alcohol Use: No    Review of Systems Constitutional: No recent illness. Eyes: No visual changes. ENT: No sore throat. Cardiovascular: Denies chest pain or palpitations. Respiratory: Denies shortness of breath. Gastrointestinal: No abdominal pain.  Genitourinary: Negative for dysuria. Musculoskeletal: Pain in bilateral  ribs.  Skin: Negative for rash. Neurological: Negative for headaches, focal weakness or numbness. 10-point ROS otherwise negative.  ____________________________________________   PHYSICAL EXAM:  VITAL SIGNS: ED Triage Vitals  Enc Vitals Group     BP 10/09/15 0807 189/87 mmHg     Pulse Rate 10/09/15 0807 89     Resp 10/09/15 0807 16     Temp 10/09/15 0807 98.5 F (36.9 C)     Temp Source 10/09/15 0807 Oral     SpO2 10/09/15 0807 100 %     Weight 10/09/15 0807 132 lb (59.875 kg)     Height 10/09/15 0807 5\' 3"  (1.6 m)     Head Cir --      Peak Flow --      Pain Score 10/09/15 0808 8     Pain Loc --      Pain Edu? --      Excl. in [place]? --     Constitutional: Alert and oriented. Chronically ill appearing. No acute distress. Eyes: Conjunctivae are normal.  EOMI. Head: Old contusion noted to the parietal area Nose: No congestion/rhinnorhea. Neck: No stridor.  Respiratory: Normal respiratory effort.   Musculoskeletal: Contusions in different stages generalized over her back and bilateral shoulders as well as both knees. There is also an old appearing contusion noted to the anterior right hip/abdomen. Neurologic:  Normal speech and language. No gross focal neurologic deficits are appreciated. Speech is normal. No gait instability. Skin:  Skin is warm, dry and intact. See musculoskeletal exam.  ____________________________________________   LABS (all labs ordered are listed, but only abnormal results are displayed)  Labs Reviewed - No data to display ____________________________________________  RADIOLOGY  Left 8th and 9th rib fractures ____________________________________________   PROCEDURES  Procedure(s) performed:    ____________________________________________   INITIAL IMPRESSION / ASSESSMENT AND PLAN / ED COURSE  Pertinent labs & imaging results that were available during my care of the patient were reviewed by me and considered in my medical decision making (see chart for details).  ----------------------------------------- 9:11 AM on 10/09/2015 -----------------------------------------    Patient to be moved to room 3 for cardiac monitoring. Care relinquished to Dr. Reita Cliche at this time. ____________________________________________   FINAL CLINICAL IMPRESSION(S) / ED DIAGNOSES  Final diagnoses:  None       Victorino Dike, FNP 10/09/15 [id number]  Lisa Roca, MD 10/09/15 1108

## 2015-10-19 ENCOUNTER — Encounter: Payer: Self-pay | Admitting: *Deleted

## 2015-10-19 ENCOUNTER — Inpatient Hospital Stay
Admission: EM | Admit: 2015-10-19 | Discharge: 2015-10-21 | DRG: 641 | Disposition: A | Discharge status: Home Health | Payer: Self-pay | Attending: Internal Medicine | Admitting: Internal Medicine

## 2015-10-19 ENCOUNTER — Emergency Department: Payer: Self-pay

## 2015-10-19 DIAGNOSIS — G250 Essential tremor: Secondary | ICD-10-CM | POA: Diagnosis present

## 2015-10-19 DIAGNOSIS — G43909 Migraine, unspecified, not intractable, without status migrainosus: Secondary | ICD-10-CM | POA: Diagnosis present

## 2015-10-19 DIAGNOSIS — E860 Dehydration: Secondary | ICD-10-CM | POA: Diagnosis present

## 2015-10-19 DIAGNOSIS — D631 Anemia in chronic kidney disease: Secondary | ICD-10-CM | POA: Diagnosis present

## 2015-10-19 DIAGNOSIS — R109 Unspecified abdominal pain: Secondary | ICD-10-CM

## 2015-10-19 DIAGNOSIS — R479 Unspecified speech disturbances: Secondary | ICD-10-CM | POA: Diagnosis present

## 2015-10-19 DIAGNOSIS — N182 Chronic kidney disease, stage 2 (mild): Secondary | ICD-10-CM | POA: Diagnosis present

## 2015-10-19 DIAGNOSIS — E876 Hypokalemia: Secondary | ICD-10-CM | POA: Diagnosis present

## 2015-10-19 DIAGNOSIS — I129 Hypertensive chronic kidney disease with stage 1 through stage 4 chronic kidney disease, or unspecified chronic kidney disease: Secondary | ICD-10-CM | POA: Diagnosis present

## 2015-10-19 DIAGNOSIS — R627 Adult failure to thrive: Secondary | ICD-10-CM | POA: Diagnosis present

## 2015-10-19 DIAGNOSIS — D649 Anemia, unspecified: Secondary | ICD-10-CM

## 2015-10-19 DIAGNOSIS — E871 Hypo-osmolality and hyponatremia: Principal | ICD-10-CM | POA: Diagnosis present

## 2015-10-19 DIAGNOSIS — Z79899 Other long term (current) drug therapy: Secondary | ICD-10-CM

## 2015-10-19 DIAGNOSIS — K5900 Constipation, unspecified: Secondary | ICD-10-CM | POA: Diagnosis present

## 2015-10-19 DIAGNOSIS — G40909 Epilepsy, unspecified, not intractable, without status epilepticus: Secondary | ICD-10-CM | POA: Diagnosis present

## 2015-10-19 HISTORY — DX: Hypo-osmolality and hyponatremia: E87.1

## 2015-10-19 LAB — URINE DRUG SCREEN, QUALITATIVE (ARMC ONLY)
AMPHETAMINES, UR SCREEN: NOT DETECTED
BENZODIAZEPINE, UR SCRN: NOT DETECTED
Barbiturates, Ur Screen: NOT DETECTED
CANNABINOID 50 NG, UR ~~LOC~~: NOT DETECTED
Cocaine Metabolite,Ur ~~LOC~~: NOT DETECTED
MDMA (Ecstasy)Ur Screen: NOT DETECTED
Methadone Scn, Ur: NOT DETECTED
Opiate, Ur Screen: NOT DETECTED
PHENCYCLIDINE (PCP) UR S: NOT DETECTED
TRICYCLIC, UR SCREEN: NOT DETECTED

## 2015-10-19 LAB — URINALYSIS COMPLETE WITH MICROSCOPIC (ARMC ONLY)
Bacteria, UA: NONE SEEN
Bilirubin Urine: NEGATIVE
Glucose, UA: NEGATIVE mg/dL
Hgb urine dipstick: NEGATIVE
Leukocytes, UA: NEGATIVE
Nitrite: NEGATIVE
Protein, ur: NEGATIVE mg/dL
RBC / HPF: NONE SEEN RBC/hpf (ref 0–5)
Specific Gravity, Urine: 1.012 (ref 1.005–1.030)
pH: 6 (ref 5.0–8.0)

## 2015-10-19 LAB — COMPREHENSIVE METABOLIC PANEL
ALK PHOS: 65 U/L (ref 38–126)
ALT: 47 U/L (ref 17–63)
AST: 73 U/L — AB (ref 15–41)
Albumin: 3.7 g/dL (ref 3.5–5.0)
Anion gap: 13 (ref 5–15)
BILIRUBIN TOTAL: 1.4 mg/dL — AB (ref 0.3–1.2)
BUN: 44 mg/dL — AB (ref 6–20)
CALCIUM: 9.2 mg/dL (ref 8.9–10.3)
CHLORIDE: 86 mmol/L — AB (ref 101–111)
CO2: 24 mmol/L (ref 22–32)
CREATININE: 1.36 mg/dL — AB (ref 0.61–1.24)
GFR, EST NON AFRICAN AMERICAN: 57 mL/min — AB (ref >60)
Glucose, Bld: 89 mg/dL (ref 65–99)
Potassium: 2.9 mmol/L — CL (ref 3.5–5.1)
Sodium: 123 mmol/L — ABNORMAL LOW (ref 135–145)
TOTAL PROTEIN: 6.5 g/dL (ref 6.5–8.1)

## 2015-10-19 LAB — CBC
HCT: 24.2 % — ABNORMAL LOW (ref 40.0–52.0)
Hemoglobin: 8.3 g/dL — ABNORMAL LOW (ref 13.0–18.0)
MCH: 33.4 pg (ref 26.0–34.0)
MCHC: 34.1 g/dL (ref 32.0–36.0)
MCV: 97.8 fL (ref 80.0–100.0)
Platelets: 172 K/uL (ref 150–440)
RBC: 2.47 MIL/uL — ABNORMAL LOW (ref 4.40–5.90)
RDW: 15.7 % — ABNORMAL HIGH (ref 11.5–14.5)
WBC: 2.3 K/uL — ABNORMAL LOW (ref 3.8–10.6)

## 2015-10-19 LAB — SODIUM
Sodium: 123 mmol/L — ABNORMAL LOW (ref 135–145)
Sodium: 125 mmol/L — ABNORMAL LOW (ref 135–145)

## 2015-10-19 LAB — ETHANOL: Alcohol, Ethyl (B): <5 mg/dL

## 2015-10-19 LAB — TSH: TSH: 2.49 u[IU]/mL (ref 0.350–4.500)

## 2015-10-19 LAB — ACETAMINOPHEN LEVEL: Acetaminophen (Tylenol), Serum: <10 ug/mL — ABNORMAL LOW (ref 10–30)

## 2015-10-19 LAB — POTASSIUM: Potassium: 3 mmol/L — ABNORMAL LOW (ref 3.5–5.1)

## 2015-10-19 LAB — LIPASE, BLOOD: Lipase: 39 U/L (ref 11–51)

## 2015-10-19 LAB — SALICYLATE LEVEL: Salicylate Lvl: <4 mg/dL (ref 2.8–30.0)

## 2015-10-19 MED ORDER — AMLODIPINE BESYLATE 5 MG PO TABS
5.0000 mg | ORAL_TABLET | Freq: Every day | ORAL | Status: DC
  Administered 2015-10-19 – 2015-10-21 (×3): 5 mg via ORAL
  Filled 2015-10-19 (×3): qty 1

## 2015-10-19 MED ORDER — SENNA 8.6 MG PO TABS
1.0000 | ORAL_TABLET | Freq: Two times a day (BID) | ORAL | Status: DC
  Administered 2015-10-19 – 2015-10-21 (×4): 8.6 mg via ORAL
  Filled 2015-10-19 (×4): qty 1

## 2015-10-19 MED ORDER — CARBAMAZEPINE 200 MG PO TABS
400.0000 mg | ORAL_TABLET | Freq: Two times a day (BID) | ORAL | Status: DC
  Administered 2015-10-19 – 2015-10-21 (×4): 400 mg via ORAL
  Filled 2015-10-19 (×5): qty 2

## 2015-10-19 MED ORDER — DIVALPROEX SODIUM 250 MG PO DR TAB
1000.0000 mg | DELAYED_RELEASE_TABLET | Freq: Two times a day (BID) | ORAL | Status: DC
  Administered 2015-10-19 – 2015-10-21 (×4): 1000 mg via ORAL
  Filled 2015-10-19 (×4): qty 4

## 2015-10-19 MED ORDER — MORPHINE SULFATE (PF) 2 MG/ML IV SOLN: 1.0000 mg | INTRAVENOUS | Status: DC | PRN

## 2015-10-19 MED ORDER — BUTALBITAL-APAP-CAFFEINE 50-325-40 MG PO TABS
1.0000 | ORAL_TABLET | ORAL | Status: DC | PRN
  Administered 2015-10-21: 1 via ORAL
  Filled 2015-10-19: qty 1

## 2015-10-19 MED ORDER — BISACODYL 10 MG RE SUPP: 10.0000 mg | Freq: Every day | RECTAL | Status: DC | PRN

## 2015-10-19 MED ORDER — ENOXAPARIN SODIUM 40 MG/0.4ML ~~LOC~~ SOLN
40.0000 mg | SUBCUTANEOUS | Status: DC
  Administered 2015-10-19 – 2015-10-20 (×2): 40 mg via SUBCUTANEOUS
  Filled 2015-10-19 (×2): qty 0.4

## 2015-10-19 MED ORDER — SODIUM CHLORIDE 0.9 % IV SOLN
Freq: Once | INTRAVENOUS | Status: AC
  Administered 2015-10-19: 14:00:00 via INTRAVENOUS

## 2015-10-19 MED ORDER — ONDANSETRON HCL 4 MG/2ML IJ SOLN: 4.0000 mg | Freq: Four times a day (QID) | INTRAMUSCULAR | Status: DC | PRN

## 2015-10-19 MED ORDER — ONDANSETRON HCL 4 MG PO TABS: 4.0000 mg | ORAL_TABLET | Freq: Four times a day (QID) | ORAL | Status: DC | PRN

## 2015-10-19 MED ORDER — ACETAMINOPHEN 650 MG RE SUPP: 650.0000 mg | Freq: Four times a day (QID) | RECTAL | Status: DC | PRN

## 2015-10-19 MED ORDER — SODIUM CHLORIDE 0.9 % IJ SOLN
3.0000 mL | Freq: Two times a day (BID) | INTRAMUSCULAR | Status: DC
  Administered 2015-10-19 – 2015-10-21 (×3): 3 mL via INTRAVENOUS

## 2015-10-19 MED ORDER — DIVALPROEX SODIUM 250 MG PO DR TAB
500.0000 mg | DELAYED_RELEASE_TABLET | Freq: Every day | ORAL | Status: DC
  Administered 2015-10-20: 500 mg via ORAL
  Filled 2015-10-19: qty 2

## 2015-10-19 MED ORDER — ACETAMINOPHEN 325 MG PO TABS: 650.0000 mg | ORAL_TABLET | Freq: Four times a day (QID) | ORAL | Status: DC | PRN

## 2015-10-19 MED ORDER — CARBAMAZEPINE 200 MG PO TABS
200.0000 mg | ORAL_TABLET | Freq: Every day | ORAL | Status: DC
  Administered 2015-10-20: 13:00:00 200 mg via ORAL
  Filled 2015-10-19 (×2): qty 1

## 2015-10-19 MED ORDER — DOCUSATE SODIUM 100 MG PO CAPS
100.0000 mg | ORAL_CAPSULE | Freq: Two times a day (BID) | ORAL | Status: DC
  Administered 2015-10-19 – 2015-10-21 (×4): 100 mg via ORAL
  Filled 2015-10-19 (×4): qty 1

## 2015-10-19 MED ORDER — SODIUM CHLORIDE 0.9 % IV SOLN
INTRAVENOUS | Status: DC
  Administered 2015-10-19 – 2015-10-21 (×6): via INTRAVENOUS

## 2015-10-19 MED ORDER — POTASSIUM CHLORIDE 10 MEQ/100ML IV SOLN
10.0000 meq | INTRAVENOUS | Status: AC
  Administered 2015-10-19 (×2): 10 meq via INTRAVENOUS
  Filled 2015-10-19 (×4): qty 100

## 2015-10-19 MED ORDER — PANTOPRAZOLE SODIUM 40 MG PO TBEC
40.0000 mg | DELAYED_RELEASE_TABLET | Freq: Every day | ORAL | Status: DC
  Administered 2015-10-20 – 2015-10-21 (×2): 40 mg via ORAL
  Filled 2015-10-19 (×2): qty 1

## 2015-10-19 NOTE — H&P (Signed)
[facility] [place] NAME: [name]    MR#:  [id number]  DATE OF BIRTH:  [birth year]  DATE OF ADMISSION:  10/19/2015  PRIMARY CARE PHYSICIAN: [name], MD   REQUESTING/REFERRING PHYSICIAN: Harvest Dark MD  CHIEF COMPLAINT:   Chief Complaint  Patient presents with  . Failure To Thrive  . Abdominal Pain    HISTORY OF PRESENT ILLNESS: [name]  is a 54 y.o. male with a known history of  hypertension, seizures, chronic kidney disease, anemia, migraines, chronic hyponatremia who presents to the emergency room with complaint of having abdominal pain. In the lower abdomen. He describes it as a sharp type of pain. Patient is a very poor historian and unable to provide any meaningful history he is very tangential in his communication.   Patient reports that he has had decrease in by mouth intake over the past 3 days. He had a renal CT protocol. Which did show moderate amount of stool.   PAST MEDICAL HISTORY:   Past Medical History  Diagnosis Date  . Hypertension   . Seizures ([place])   . Kidney disease   . Anemia   . Migraine   . Hyponatremia     chronic    PAST SURGICAL HISTORY:  Past Surgical History  Procedure Laterality Date  . None      SOCIAL HISTORY:  Social History  Substance Use Topics  . Smoking status: Never Smoker   . Smokeless tobacco: Not on file  . Alcohol Use: No    FAMILY HISTORY:  Family History  Problem Relation Age of Onset  . Heart attack Father   . Kidney disease Father     DRUG ALLERGIES: No Known Allergies  REVIEW OF SYSTEMS:   Limited due to patient being very poor historian, and his thought process is very tangantial, unable to focus on one question.  MEDICATIONS AT HOME:  Prior to Admission medications   Medication Sig Start Date End Date Taking? Authorizing Provider  amLODipine (NORVASC) 5 MG tablet Take 5 mg by mouth daily.   Yes Historical Provider, MD   butalbital-acetaminophen-caffeine (FIORICET, ESGIC) 50-325-40 MG per tablet Take 1-2 tablets by mouth every 4 (four) hours as needed for migraine.   Yes Historical Provider, MD  carbamazepine (TEGRETOL) 200 MG tablet Take 200-400 mg by mouth 3 (three) times daily. Pt takes two in the morning, one at noon, and two at bedtime.   Yes Historical Provider, MD  divalproex (DEPAKOTE) 500 MG DR tablet Take 500-1,000 mg by mouth 3 (three) times daily. Pt takes two in the morning, one at noon, and two at bedtime.   Yes Historical Provider, MD  ibuprofen (ADVIL,MOTRIN) 600 MG tablet Take 1 tablet (600 mg total) by mouth every 8 (eight) hours as needed. 10/09/15  Yes Lisa Roca, MD  lisinopril (PRINIVIL,ZESTRIL) 20 MG tablet Take 20 mg by mouth daily.   Yes Historical Provider, MD  pantoprazole (PROTONIX) 40 MG tablet Take 1 tablet (40 mg total) by mouth daily. 09/07/15  Yes Fritzi Mandes, MD  propranolol (INDERAL) 20 MG tablet Take 40 mg by mouth 2 (two) times daily.   Yes Historical Provider, MD      PHYSICAL EXAMINATION:   VITAL SIGNS: Blood pressure 96/78, pulse 65, temperature 98.2 F (36.8 C), temperature source Oral, resp. rate 19, height 5\' 6"  (1.676 m), weight 58.968 kg (130 lb), SpO2 100 %.  GENERAL:  54 y.o.-year-old patient lying in the bed with no  acute distress.  EYES: Pupils equal, round, reactive to light and accommodation. No scleral icterus. Extraocular muscles intact.  HEENT: Head atraumatic, normocephalic. Oropharynx and nasopharynx clear.  NECK:  Supple, no jugular venous distention. No thyroid enlargement, no tenderness.  LUNGS: Normal breath sounds bilaterally, no wheezing, rales,rhonchi or crepitation. No use of accessory muscles of respiration.  CARDIOVASCULAR: S1, S2 normal. No murmurs, rubs, or gallops.  ABDOMEN: Soft, mild lower abdomen tenderness, nondistended. Bowel sounds present. No organomegaly or mass.  EXTREMITIES: No pedal edema, cyanosis, or clubbing.  NEUROLOGIC:  Cranial nerves II through XII are intact. Muscle strength 5/5 in all extremities. Sensation intact. Gait not checked.  PSYCHIATRIC: The patient is alert and oriented x 3. Mildly anxious SKIN: No obvious rash, lesion, or ulcer.   LABORATORY PANEL:   CBC  Recent Labs Lab 10/19/15 1124  WBC 2.3*  HGB 8.3*  HCT 24.2*  PLT 172  MCV 97.8  MCH 33.4  MCHC 34.1  RDW 15.7*   ------------------------------------------------------------------------------------------------------------------  Chemistries   Recent Labs Lab 10/19/15 1124  NA 123*  K 2.9*  CL 86*  CO2 24  GLUCOSE 89  BUN 44*  CREATININE 1.36*  CALCIUM 9.2  AST 73*  ALT 47  ALKPHOS 65  BILITOT 1.4*   ------------------------------------------------------------------------------------------------------------------ estimated creatinine clearance is 50.6 mL/min (by C-G formula based on Cr of 1.36). ------------------------------------------------------------------------------------------------------------------ No results for input(s): TSH, T4TOTAL, T3FREE, THYROIDAB in the last 72 hours.  Invalid input(s): FREET3   Coagulation profile No results for input(s): INR, PROTIME in the last 168 hours. ------------------------------------------------------------------------------------------------------------------- No results for input(s): DDIMER in the last 72 hours. -------------------------------------------------------------------------------------------------------------------  Cardiac Enzymes No results for input(s): CKMB, TROPONINI, MYOGLOBIN in the last 168 hours.  Invalid input(s): CK ------------------------------------------------------------------------------------------------------------------ Invalid input(s): POCBNP  ---------------------------------------------------------------------------------------------------------------  Urinalysis    Component Value Date/Time   COLORURINE YELLOW*  10/19/2015 1345   COLORURINE Straw 07/26/2014 1925   APPEARANCEUR CLEAR* 10/19/2015 1345   APPEARANCEUR Clear 07/26/2014 1925   LABSPEC 1.012 10/19/2015 1345   LABSPEC 1.006 07/26/2014 1925   PHURINE 6.0 10/19/2015 1345   PHURINE 6.0 07/26/2014 1925   GLUCOSEU NEGATIVE 10/19/2015 1345   GLUCOSEU Negative 07/26/2014 1925   HGBUR NEGATIVE 10/19/2015 1345   HGBUR Negative 07/26/2014 1925   BILIRUBINUR NEGATIVE 10/19/2015 1345   BILIRUBINUR Negative 07/26/2014 1925   KETONESUR 1+* 10/19/2015 1345   KETONESUR Negative 07/26/2014 1925   PROTEINUR NEGATIVE 10/19/2015 1345   PROTEINUR Negative 07/26/2014 1925   NITRITE NEGATIVE 10/19/2015 1345   NITRITE Negative 07/26/2014 1925   LEUKOCYTESUR NEGATIVE 10/19/2015 1345   LEUKOCYTESUR Negative 07/26/2014 1925     RADIOLOGY: Ct Renal Stone Study  10/19/2015  CLINICAL DATA:  Left side pain EXAM: CT ABDOMEN AND PELVIS WITHOUT CONTRAST TECHNIQUE: Multidetector CT imaging of the abdomen and pelvis was performed following the standard protocol without IV contrast. COMPARISON:  None. FINDINGS: Old bilateral rib fractures are noted. Lung bases are unremarkable. Unenhanced liver shows no biliary ductal dilatation. No calcified gallstones are noted within gallbladder. No aortic aneurysm. Moderate stool noted in right colon. No pericecal inflammation. Normal appendix. Unenhanced kidneys are symmetrical in size. No nephrolithiasis. No hydronephrosis or hydroureter. No calcified ureteral calculi. There is no small bowel obstruction. No ascites or free air. No adenopathy. Prostate gland and seminal vesicles are unremarkable. Some colonic gas noted in transverse colon and proximal sigmoid colon. Moderate stool noted in descending colon. Some colonic stool noted within rectum. No calcified calculi are noted within urinary bladder. Mild anasarca infiltration of  subcutaneous fat in left pelvic wall see axial image 75. Mild degenerative changes thoracolumbar spine.  IMPRESSION: 1. There is no nephrolithiasis.  No hydronephrosis or hydroureter. 2. No calcified ureteral calculi. 3. Moderate stool noted in right colon and descending colon. No colonic obstruction. Normal appendix. No pericecal inflammation. 4. No small bowel obstruction. 5. No calcified calculi are noted within urinary bladder. Electronically Signed   By: Lahoma Crocker M.D.   On: 10/19/2015 13:21    EKG: Orders placed or performed during the hospital encounter of 10/09/15  . ED EKG  . ED EKG  . EKG 12-Lead  . EKG 12-Lead  . EKG    IMPRESSION AND PLAN: Patient is a 53 year old male with history of chronic hyponatremia presents to the emergency room with abdominal pain  1. Acute on chronic hyponatremia likely due to dehydration and poor by mouth intake: At this time will give him IV normal saline follow sodium level  2. Abdominal pain: Likely related to constipation as seen on the CT scan will start him on a bowel regimen to see if that helps with his symptoms.  3. Hypertension blood pressure medication and let a patient being hypotensive IV fluids  4. Seizure disorder continue Tegretol and Depakote  5. Miscellaneous we will do Lovenox for DVT prophylaxis    All the records are reviewed and case discussed with ED provider. Management plans discussed with the patient, family and they are in agreement.  CODE STATUS: Advance Directive Documentation        Most Recent Value   Type of Advance Directive  Living will   Pre-existing out of facility DNR order (yellow form or pink MOST form)     "MOST" Form in Place?         TOTAL TIME TAKING CARE OF THIS PATIENT: 55 minutes.    Dustin Flock M.D on 10/19/2015 at 2:22 PM  Between 7am to 6pm - Pager - [phone]  After 6pm go to [email or web address] - password [email or web address] [facility]  Office  [phone]  CC: Primary care physician; [name], MD

## 2015-10-19 NOTE — Plan of Care (Signed)
Problem: Education: Goal: Knowledge of [facility] Education information/materials will improve Outcome: Progressing Pt instructed to call for assistance - has hx of falls; bed alarm is on.  This is pt's 3rd admission in 12 mos - chronic hyponatremia - currently 123; and K+ also low at 2.9.  On IVF NS and gave 2 IV doses of K+.   Problem: Safety: Goal: Ability to remain free from injury will improve Outcome: Progressing Pt has severe tremors and hx of seizures.  Pt will call for assistance.   Problem: Fluid Volume: Goal: Ability to maintain a balanced intake and output will improve Outcome: Progressing Pt states that he hasn't a bowel movement in 1 wk - possible cause for abdominal pain.  Pt has been put on bowel regimen.  Tolerating diet well.

## 2015-10-19 NOTE — ED Notes (Signed)
Pt given sprite and graham crackers.  

## 2015-10-19 NOTE — ED Notes (Signed)
Pt arrives via EMS from home, EMs states pt did not give specific complaint but stated he needed to come to the hospital, upon arrival pt states left sided abd pain and that he is unable to have a BM, states he has not eaten or drank anything in several days despite having groceries at home, pt awake and alert upon arrival, denies any nausea or vomiting

## 2015-10-19 NOTE — ED Provider Notes (Signed)
[facility] Emergency Department Provider Note  Time seen: 11:30 AM  I have reviewed the triage vital signs and the nursing notes.   HISTORY  Chief Complaint Failure To Thrive and Abdominal Pain    HPI [name] is a 54 y.o. male with a past medical history of hypertension, seizure, anemia, presents the emergency department with complaints of abdominal pain. According to EMS they were called out to the house. The patient initially refused to tell them why he wanted transport to the hospital. They state that the patient's residence was extremely dirty. Patient told them that he has not eaten in several days. EMS reports that there was food in the house, but overall poor living conditions. Upon arrival to the emergency department the patient states he is having abdominal pain which is why he wanted to come to the ER. States left-sided abdominal pain times one week. States he's not had a bowel movement for the last several weeks. Denies nausea, vomiting, dysuria, chest pain, shortness of breath. He describes his abdominal pain is mild.    Past Medical History  Diagnosis Date  . Hypertension   . Seizures ([place])   . Kidney disease   . Anemia   . Migraine     Patient Active Problem List   Diagnosis Date Noted  . Protein-calorie malnutrition, severe ([place]) 09/07/2015  . Hyponatremia 09/05/2015  . Elevated troponin 09/05/2015  . Hypokalemia 09/05/2015  . Leukopenia 09/05/2015  . Renal insufficiency 09/05/2015  . Tremor, essential 07/15/2015  . Essential hypertension 07/15/2015  . Chronic renal insufficiency 07/15/2015  . Leg swelling 07/15/2015  . Chest pain 04/17/2015    History reviewed. No pertinent past surgical history.  Current Outpatient Rx  Name  Route  Sig  Dispense  Refill  . amLODipine (NORVASC) 5 MG tablet   Oral   Take 5 mg by mouth daily.         . butalbital-acetaminophen-caffeine (FIORICET, ESGIC) 50-325-40 MG per tablet   Oral  Take 1-2 tablets by mouth every 4 (four) hours as needed for migraine.         . carbamazepine (TEGRETOL) 200 MG tablet   Oral   Take 200-400 mg by mouth 3 (three) times daily. Pt takes two in the morning, one at noon, and two at bedtime.         . divalproex (DEPAKOTE) 500 MG DR tablet   Oral   Take 500-1,000 mg by mouth 3 (three) times daily. Pt takes two in the morning, one at noon, and two at bedtime.         Marland Kitchen ibuprofen (ADVIL,MOTRIN) 600 MG tablet   Oral   Take 1 tablet (600 mg total) by mouth every 8 (eight) hours as needed.   20 tablet   0   . lisinopril (PRINIVIL,ZESTRIL) 20 MG tablet   Oral   Take 20 mg by mouth daily.         . pantoprazole (PROTONIX) 40 MG tablet   Oral   Take 1 tablet (40 mg total) by mouth daily.   30 tablet   2   . propranolol (INDERAL) 20 MG tablet   Oral   Take 40 mg by mouth 2 (two) times daily.           Allergies Review of patient's allergies indicates no known allergies.  Family History  Problem Relation Age of Onset  . Heart attack Father   . Kidney disease Father     Social History  Social History  Substance Use Topics  . Smoking status: Never Smoker   . Smokeless tobacco: None  . Alcohol Use: No    Review of Systems Constitutional: Negative for fever Cardiovascular: Negative for chest pain. Respiratory: Negative for shortness of breath. Gastrointestinal: Left-sided abdominal pain. Negative nausea, vomiting, diarrhea Genitourinary: Negative for dysuria. Musculoskeletal: Negative for back pain. Neurological: Negative for headache 10-point ROS otherwise negative.  ____________________________________________   PHYSICAL EXAM:  VITAL SIGNS: ED Triage Vitals  Enc Vitals Group     BP 10/19/15 1117 179/94 mmHg     Pulse Rate 10/19/15 1117 75     Resp --      Temp 10/19/15 1117 98.2 F (36.8 C)     Temp Source 10/19/15 1117 Oral     SpO2 10/19/15 1117 100 %     Weight 10/19/15 1117 130 lb (58.968 kg)      Height 10/19/15 1117 5\' 6"  (1.676 m)     Head Cir --      Peak Flow --      Pain Score 10/19/15 1117 9     Pain Loc --      Pain Edu? --      Excl. in [place]? --    Constitutional: Alert and oriented. Well appearing and in no distress. Thin/frail Eyes: Normal exam ENT   Head: Normocephalic and atraumatic   Mouth/Throat: Mucous membranes are moist. Cardiovascular: Normal rate, regular rhythm. No murmur Respiratory: Normal respiratory effort without tachypnea nor retractions. Breath sounds are clear Gastrointestinal: Soft and nontender. No distention. No reaction to abdominal palpation. No CVA tenderness. Musculoskeletal: Nontender with normal range of motion in all extremities. Neurologic:  Normal speech and language. No gross focal neurologic deficits  Skin:  Skin is warm, dry and intact.  Psychiatric: Mood and affect are normal. Speech and behavior are normal.   ____________________________________________    EKG  EKG reviewed and interpreted by myself shows sinus rhythm at 71 bpm, narrow QRS, normal axis, normal intervals, nonspecific ST changes present. No ST elevations.  ____________________________________________    RADIOLOGY  CT shows no acute abnormality besides moderate stool pertinent.  ____________________________________________    INITIAL IMPRESSION / ASSESSMENT AND PLAN / ED COURSE  Pertinent labs & imaging results that were available during my care of the patient were reviewed by me and considered in my medical decision making (see chart for details).  Patient presents for left-sided abdominal pain which she states is been ongoing 1 week. Overall he appears well, but very thin/frail-appearing. No reaction to abdominal palpation. Nontender exam. We will check labs, and closely monitor in the emergency department. Per EMS the patient has overall poor living conditions at home, but at this time the patient appears to be rational, with a clear understanding  and capacity to make his medical decisions, I do not feel the patient's meets involuntary commitment criteria, but may benefit from a Education officer, museum consultation.  CT shows no acute abnormality, moderate stool pertinent. Labs show hyponatremia, we'll begin IV fluids. Hemoglobin/hematocrit is also dropped, rectal exam shows light brown stool, guaiac negative, no fecal impaction noted. Patient immediately admitted to the hospital for further treatment and evaluation.  ____________________________________________   FINAL CLINICAL IMPRESSION(S) / ED DIAGNOSES  Abdominal pain Hyponatremia  Harvest Dark, MD 10/19/15 1401

## 2015-10-19 NOTE — ED Notes (Signed)
MD notified of critical K+ of 2.9

## 2015-10-20 LAB — CBC
HCT: 20.7 % — ABNORMAL LOW (ref 40.0–52.0)
HEMOGLOBIN: 7.3 g/dL — AB (ref 13.0–18.0)
MCH: 34 pg (ref 26.0–34.0)
MCHC: 35 g/dL (ref 32.0–36.0)
MCV: 97 fL (ref 80.0–100.0)
Platelets: 148 10*3/uL — ABNORMAL LOW (ref 150–440)
RBC: 2.14 MIL/uL — AB (ref 4.40–5.90)
RDW: 16 % — ABNORMAL HIGH (ref 11.5–14.5)
WBC: 2.5 10*3/uL — AB (ref 3.8–10.6)

## 2015-10-20 LAB — MAGNESIUM: Magnesium: 1.9 mg/dL (ref 1.7–2.4)

## 2015-10-20 LAB — BASIC METABOLIC PANEL
Anion gap: 9 (ref 5–15)
BUN: 28 mg/dL — ABNORMAL HIGH (ref 6–20)
CHLORIDE: 94 mmol/L — AB (ref 101–111)
CO2: 26 mmol/L (ref 22–32)
CREATININE: 1.18 mg/dL (ref 0.61–1.24)
Calcium: 8.6 mg/dL — ABNORMAL LOW (ref 8.9–10.3)
GFR calc non Af Amer: >60 mL/min (ref >60)
Glucose, Bld: 94 mg/dL (ref 65–99)
POTASSIUM: 3.1 mmol/L — AB (ref 3.5–5.1)
SODIUM: 129 mmol/L — AB (ref 135–145)

## 2015-10-20 LAB — SODIUM
SODIUM: 129 mmol/L — AB (ref 135–145)
SODIUM: 132 mmol/L — AB (ref 135–145)

## 2015-10-20 MED ORDER — ENSURE ENLIVE PO LIQD
237.0000 mL | Freq: Two times a day (BID) | ORAL | Status: DC
  Administered 2015-10-20: 237 mL via ORAL

## 2015-10-20 MED ORDER — POTASSIUM CHLORIDE CRYS ER 20 MEQ PO TBCR
40.0000 meq | EXTENDED_RELEASE_TABLET | Freq: Two times a day (BID) | ORAL | Status: DC
  Administered 2015-10-20 – 2015-10-21 (×3): 40 meq via ORAL
  Filled 2015-10-20 (×3): qty 2

## 2015-10-20 NOTE — Plan of Care (Signed)
Individualization of Care Pt prefers to be called [name] Hx of HTN, seizures, kidney disease, anemia, migraine, chronic hyponatremia, failure to thrive  Problem: Education: Goal: Knowledge of [facility] information/materials will improve Outcome: Progressing Pt with history of falls.  Bed alarm on.  Education on using call bell to request help.  Patient compliant throughout shift.   has hx of falls; bed alarm is on.   Problem: Safety: Goal: Ability to remain free from injury will improve Outcome: Progressing Pt has severe tremors and hx of seizures. Pt compliant with calling for assistance.  Bed in lowest position with wheels locked, 2 side rails raised, call bell within reach, bed alarm on.  Problem: Fluid Volume: Goal: Ability to maintain a balanced intake and output will improve Outcome: Progressing Patient has had 3 admissions within the past year for chronic hyponatremia.  He remains on IVF at 125 mL/hr.

## 2015-10-20 NOTE — Progress Notes (Signed)
[name] at [place] NAME: [name]    MR#:  [id number]  DATE OF BIRTH:  04/17/1961  SUBJECTIVE:  CHIEF COMPLAINT:   Chief Complaint  Patient presents with  . Failure To Thrive  . Abdominal Pain    Have abnormal movements, and generalized weakness, no new complains, he appears somewhat confused , and keep talking about other stories while answering questions.  REVIEW OF SYSTEMS:  CONSTITUTIONAL: No fever,positive for fatigue or weakness.  EYES: No blurred or double vision.  EARS, NOSE, AND THROAT: No tinnitus or ear pain.  RESPIRATORY: No cough, shortness of breath, wheezing or hemoptysis.  CARDIOVASCULAR: No chest pain, orthopnea, edema.  GASTROINTESTINAL: No nausea, vomiting, diarrhea or abdominal pain.  GENITOURINARY: No dysuria, hematuria.  ENDOCRINE: No polyuria, nocturia,  HEMATOLOGY: No anemia, easy bruising or bleeding SKIN: No rash or lesion. MUSCULOSKELETAL: No joint pain or arthritis.   NEUROLOGIC: No tingling, numbness, weakness.  PSYCHIATRY: No anxiety or depression.   ROS   Not very reliable as he is repeating some of the stories and , some talks are not related to questions.  DRUG ALLERGIES:  No Known Allergies  VITALS:  Blood pressure 108/59, pulse 86, temperature 99.1 F (37.3 C), temperature source Oral, resp. rate 20, height 5\' 2"  (1.575 m), weight 53.326 kg (117 lb 9 oz), SpO2 100 %.  PHYSICAL EXAMINATION:   GENERAL: 54 y.o.-year-old patient lying in the bed with no acute distress.  EYES: Pupils equal, round, reactive to light and accommodation. No scleral icterus. Extraocular muscles intact.  HEENT: Head atraumatic, normocephalic. Oropharynx and nasopharynx clear.  NECK: Supple, no jugular venous distention. No thyroid enlargement, no tenderness.  LUNGS: Normal breath sounds bilaterally, no wheezing, rales,rhonchi or crepitation. No use of accessory muscles of respiration.  CARDIOVASCULAR:  S1, S2 normal. No murmurs, rubs, or gallops.  ABDOMEN: Soft, mild lower abdomen tenderness, nondistended. Bowel sounds present. No organomegaly or mass.  EXTREMITIES: No pedal edema, cyanosis, or clubbing.  NEUROLOGIC: Cranial nerves II through XII are intact. Muscle strength 5/5 in all extremities. Sensation intact. Gait not checked. he have some tics like involuntary movements. PSYCHIATRIC: The patient is alert and oriented x 2. Mildly anxious SKIN: No obvious rash, lesion, or ulcer.   Physical Exam LABORATORY PANEL:   CBC  Recent Labs Lab 10/20/15 0426  WBC 2.5*  HGB 7.3*  HCT 20.7*  PLT 148*   ------------------------------------------------------------------------------------------------------------------  Chemistries   Recent Labs Lab 10/19/15 1124  10/20/15 0426 10/20/15 1409  NA 123*  < > 129* 129*  K 2.9*  < > 3.1*  --   CL 86*  --  94*  --   CO2 24  --  26  --   GLUCOSE 89  --  94  --   BUN 44*  --  28*  --   CREATININE 1.36*  --  1.18  --   CALCIUM 9.2  --  8.6*  --   MG  --   --  1.9  --   AST 73*  --   --   --   ALT 47  --   --   --   ALKPHOS 65  --   --   --   BILITOT 1.4*  --   --   --   < > = values in this interval not displayed. ------------------------------------------------------------------------------------------------------------------  Cardiac Enzymes No results for input(s): TROPONINI in the last 168 hours. ------------------------------------------------------------------------------------------------------------------  RADIOLOGY:  Ct  Renal Stone Study  10/19/2015  CLINICAL DATA:  Left side pain EXAM: CT ABDOMEN AND PELVIS WITHOUT CONTRAST TECHNIQUE: Multidetector CT imaging of the abdomen and pelvis was performed following the standard protocol without IV contrast. COMPARISON:  None. FINDINGS: Old bilateral rib fractures are noted. Lung bases are unremarkable. Unenhanced liver shows no biliary ductal dilatation. No calcified  gallstones are noted within gallbladder. No aortic aneurysm. Moderate stool noted in right colon. No pericecal inflammation. Normal appendix. Unenhanced kidneys are symmetrical in size. No nephrolithiasis. No hydronephrosis or hydroureter. No calcified ureteral calculi. There is no small bowel obstruction. No ascites or free air. No adenopathy. Prostate gland and seminal vesicles are unremarkable. Some colonic gas noted in transverse colon and proximal sigmoid colon. Moderate stool noted in descending colon. Some colonic stool noted within rectum. No calcified calculi are noted within urinary bladder. Mild anasarca infiltration of subcutaneous fat in left pelvic wall see axial image 75. Mild degenerative changes thoracolumbar spine. IMPRESSION: 1. There is no nephrolithiasis.  No hydronephrosis or hydroureter. 2. No calcified ureteral calculi. 3. Moderate stool noted in right colon and descending colon. No colonic obstruction. Normal appendix. No pericecal inflammation. 4. No small bowel obstruction. 5. No calcified calculi are noted within urinary bladder. Electronically Signed   By: Lahoma Crocker M.D.   On: 10/19/2015 13:21    ASSESSMENT AND PLAN:   Active Problems:   Hyponatremia  Patient is a 54 year old male with history of chronic hyponatremia presents to the emergency room with abdominal pain  1. Acute on chronic hyponatremia likely due to dehydration and poor by mouth intake:  IV normal saline follow sodium level   Called nephrology consult due to recurrent problem.  2. Abdominal pain: Likely related to constipation as seen on the CT scan will start him on a bowel regimen to see if that helps with his symptoms.  3. Hypertension , cont amlodipin.  4. Seizure disorder continue Tegretol and Depakote.  5. Miscellaneous we will do Lovenox for DVT prophylaxis   All the records are reviewed and case discussed with Care Management/Social Workerr. Management plans discussed with the patient,  family and they are in agreement.  CODE STATUS: Full  TOTAL TIME TAKING CARE OF THIS PATIENT: 35 minutes.     POSSIBLE D/C IN 1-2 DAYS, DEPENDING ON CLINICAL CONDITION.   Vaughan Basta M.D on 10/20/2015   Between 7am to 6pm - Pager - [phone]  After 6pm go to [email or web address] - password [email or web address] [facility]  Office  [phone]  CC: Primary care physician; [name], MD  Note: This dictation was prepared with Dragon dictation along with smaller phrase technology. Any transcriptional errors that result from this process are unintentional.

## 2015-10-20 NOTE — Progress Notes (Signed)
Initial Nutrition Assessment  DOCUMENTATION CODES:   Severe malnutrition in context of chronic illness  INTERVENTION:   Meals and Snacks: Cater to patient preferences on regular diet; finger foods when able Medical Food Supplement Therapy: will recommend Mighty Shakes and Magic Cup on meal trays TID for added nutrition (each supplement provides approximately 300kcals and 9g protein)    NUTRITION DIAGNOSIS:   Malnutrition related to chronic illness as evidenced by energy intake < 75% for > or equal to 1 month, moderate depletions of muscle mass, percent weight loss.  GOAL:   Patient will meet greater than or equal to 90% of their needs  MONITOR:    (Energy Intake, Anthropometrics, Digestive system,Electrolyte and Renal Profile)  REASON FOR ASSESSMENT:   Consult Poor PO  ASSESSMENT:   Pt admitted with chronic hyponatremia and FTT. Nephrology following. Pt from home, however possibly passing out PTA. Pt reports standing at the front door without walker waiting for someone to come by for at least 3 days.   Past Medical History  Diagnosis Date  . Hypertension   . Seizures ([place])   . Kidney disease   . Anemia   . Migraine   . Hyponatremia     chronic    Diet Order:  Diet regular Room service appropriate?: Yes; Fluid consistency:: Thin    Current Nutrition: Pt reports eating 100% of breakfast tray this am including eggs, bacon, home fries and oatmeal with coca-cola. 50% of tray recorded.   Food/Nutrition-Related History: Pt reports poor po intake PTA. Pt reports not eating hardly at all this past week secondary to cutting hand and not being able to fix meals. Pt reports usually eating a good breakfast. RD recalls on last admission neighbor reporting pt eats primarily tater tots. On last admission, pt preferring finger foods and drinks with lids secondary to tremors.   Scheduled Medications:  . amLODipine  5 mg Oral Daily  . carbamazepine  200 mg Oral Q1200  .  carbamazepine  400 mg Oral BID  . divalproex  1,000 mg Oral BID  . divalproex  500 mg Oral Q1200  . docusate sodium  100 mg Oral BID  . enoxaparin (LOVENOX) injection  40 mg Subcutaneous Q24H  . feeding supplement (ENSURE ENLIVE)  237 mL Oral BID BM  . pantoprazole  40 mg Oral QAC breakfast  . potassium chloride  40 mEq Oral BID  . senna  1 tablet Oral BID  . sodium chloride  3 mL Intravenous Q12H    Continuous Medications:  . sodium chloride 125 mL/hr at 10/20/15 0625     Electrolyte/Renal Profile and Glucose Profile:   Recent Labs Lab 10/19/15 1124  10/19/15 2200 10/20/15 0426 10/20/15 1409  NA 123*  < > 125* 129* 129*  K 2.9*  --  3.0* 3.1*  --   CL 86*  --   --  94*  --   CO2 24  --   --  26  --   BUN 44*  --   --  28*  --   CREATININE 1.36*  --   --  1.18  --   CALCIUM 9.2  --   --  8.6*  --   MG  --   --   --  1.9  --   GLUCOSE 89  --   --  94  --   < > = values in this interval not displayed. Protein Profile:  Recent Labs Lab 10/19/15 1124  ALBUMIN 3.7  Gastrointestinal Profile: Last BM:    Nutrition-Focused Physical Exam Findings: Nutrition-Focused physical exam completed. Findings are no fat depletion, mild-moderate muscle depletion of lower extremities, and no edema.     Weight Change: Pt with 20% weight loss since May 2016 and 11% weight loss in 1.5 months.   Height:   Ht Readings from Last 1 Encounters:  10/19/15 5\' 2"  (1.575 m)    Weight:   Wt Readings from Last 1 Encounters:  10/19/15 117 lb 9 oz (53.326 kg)    Wt Readings from Last 10 Encounters:  10/19/15 117 lb 9 oz (53.326 kg)  10/09/15 132 lb (59.875 kg)  09/06/15 132 lb 4.8 oz (60.011 kg)  07/15/15 147 lb 8 oz (66.906 kg)  04/18/15 147 lb 3.2 oz (66.769 kg)     BMI:  Body mass index is 21.5 kg/(m^2).  Estimated Nutritional Needs:   Kcal:  BEE: 1237kcals, TEE: (IF 1.1-1.3)(AF 1.2) 1633-1930kcals  Protein:  43-58g protein (0.8-1.0g/kg)  Fluid:  1333-1511mL of fluid  (25-45mL/kg)  EDUCATION NEEDS:   No education needs identified at this time   [place], RD, LDN Pager [phone]

## 2015-10-20 NOTE — Evaluation (Signed)
Physical Therapy Evaluation Patient Details Name: [name] MRN: [id number] DOB: 12-03-1961 Today's Date: 10/20/2015   History of Present Illness  54 yo male with onset of FTT and falls was admitted with mult bruised areas on sides, shoulder and back.  PMHx:  seizures, tremor, old rib fractures.  Clinical Impression  Pt was seen for evaluation of his functional status, and demonstrates a considerable physical and cognitive change that make being home alone impractical now.  Will benefit from short stay rehab to restore independent gait and ease the transition with follow up HHPT    Follow Up Recommendations SNF    Equipment Recommendations  None recommended by PT    Recommendations for Other Services       Precautions / Restrictions Precautions Precautions: Fall Restrictions Weight Bearing Restrictions: No      Mobility  Bed Mobility Overal bed mobility: Needs Assistance Bed Mobility: Supine to Sit;Sit to Supine     Supine to sit: Min assist Sit to supine: Min assist      Transfers Overall transfer level: Needs assistance   Transfers: Sit to/from [employer] Transfers Sit to Stand: Min assist Stand pivot transfers: Min assist       General transfer comment: cued hand placement, reminders for sitting posture to set up  Ambulation/Gait Ambulation/Gait assistance: Min assist Ambulation Distance (Feet): 120 Feet Assistive device: 1 person hand held assist (rolling IV pole) Gait Pattern/deviations: Step-to pattern;Wide base of support (legs ER'd, shuffle) Gait velocity: reduced Gait velocity interpretation: Below normal speed for age/gender    Stairs            Wheelchair Mobility    Modified Rankin (Stroke Patients Only)       Balance Overall balance assessment: Needs assistance;History of Falls Sitting-balance support: Feet supported;Bilateral upper extremity supported Sitting balance-Leahy Scale: Poor   Postural control: Posterior  lean Standing balance support: Bilateral upper extremity supported Standing balance-Leahy Scale: Poor Standing balance comment: struggling to control his posture upright                              Pertinent Vitals/Pain Pain Assessment: Faces Pain Score: 2  Faces Pain Scale: Hurts a little bit Pain Location: R shoulder Pain Intervention(s): Monitored during session    Home Living Family/patient expects to be discharged to:: Private residence Living Arrangements: Alone   Type of Home: House Home Access: Stairs to enter Entrance Stairs-Rails: Right Entrance Stairs-Number of Steps: 3 Home Layout: One level Home Equipment: Environmental consultant - 2 wheels Additional Comments: has no other equipment     Prior Function Level of Independence: Independent         Comments: reports falls to explain the multitude of bruises on his back     Hand Dominance        Extremity/Trunk Assessment   Upper Extremity Assessment: Generalized weakness           Lower Extremity Assessment: Generalized weakness      Cervical / Trunk Assessment: Kyphotic  Communication   Communication: HOH  Cognition Arousal/Alertness: Awake/alert Behavior During Therapy: WFL for tasks assessed/performed Overall Cognitive Status: No family/caregiver present to determine baseline cognitive functioning                      General Comments General comments (skin integrity, edema, etc.): Pt is demonstrating some need for continual assistance to walk unless he is supported continually including unsupported sitting.   Very  limited trunk mm control    Exercises        Assessment/Plan    PT Assessment Patient needs continued PT services  PT Diagnosis Generalized weakness;Difficulty walking;Altered mental status   PT Problem List Decreased strength;Decreased range of motion;Decreased activity tolerance;Decreased balance;Decreased mobility;Decreased coordination;Decreased cognition;Decreased  safety awareness;Decreased knowledge of use of DME;Pain;Decreased skin integrity  PT Treatment Interventions DME instruction;Gait training;Stair training;Functional mobility training;Therapeutic activities;Balance training;Therapeutic exercise;Neuromuscular re-education;Patient/family education   PT Goals (Current goals can be found in the Care Plan section) Acute Rehab PT Goals Patient Stated Goal: to get back to bed PT Goal Formulation: With patient Time For Goal Achievement: 11/03/15 Potential to Achieve Goals: Good    Frequency Min 2X/week   Barriers to discharge Inaccessible home environment;Decreased caregiver support      Co-evaluation               End of Session Equipment Utilized During Treatment: Gait belt Activity Tolerance: Patient tolerated treatment well;Patient limited by fatigue (limited by balance and tremors) Patient left: in bed;with call bell/phone within reach;with bed alarm set (with pt request) Nurse Communication: Mobility status         Time: 2505-3976 PT Time Calculation (min) (ACUTE ONLY): 18 min   Charges:   PT Evaluation $Initial PT Evaluation Tier I: 1 Procedure     PT G CodesRamond Dial 2015-10-21, 11:55 AM   Mee Hives, PT MS Acute Rehab Dept. Number: [facility] [id number] and [place] [phone]

## 2015-10-20 NOTE — Progress Notes (Signed)
Subjective:  Patient is known to our practice from outpatient. He is followed by Dr. Holley Raring. Last labs from outpatient are from May 2016 when his creatinine was 1.39/GFR of 57 He presented to the emergency room via EMS post fall. He had significant bruising. There is some question about whether patient passed out Admission creatinine was 1.36 and potassium was 2.9, sodium 123 Today's creatinine is 1.18, potassium of 3.1, GFR greater than 60, sodium 129 Patient's home medication list does not included diuretic  Objective:  Vital signs in last 24 hours:  Temp:  [98 F (36.7 C)-98.4 F (36.9 C)] 98 F (36.7 C) (11/03 0511) Pulse Rate:  [69-74] 69 (11/03 0511) Resp:  [16-20] 20 (11/03 0511) BP: (96-144)/(74-81) 144/79 mmHg (11/03 0511) SpO2:  [100 %] 100 % (11/03 0511) Weight:  [53.326 kg (117 lb 9 oz)] 53.326 kg (117 lb 9 oz) (11/02 1608)  Weight change:  Filed Weights   10/19/15 1117 10/19/15 1608  Weight: 58.968 kg (130 lb) 53.326 kg (117 lb 9 oz)    Intake/Output:    Intake/Output Summary (Last 24 hours) at 10/20/15 1236 Last data filed at 10/20/15 0815  Gross per 24 hour  Intake   1872 ml  Output    200 ml  Net   1672 ml     Physical Exam: General:  no acute distress, laying in the bed   HEENT  anicteric   Neck  supple   Pulm/lungs  normal respiratory effort, scattered rhonchi   CVS/Heart  regular, no rub or gallop   Abdomen:   soft, nontender, nondistended   Extremities:  no peripheral edema   Neurologic:  intention tremors   Skin:  some bruising   Access:        Basic Metabolic Panel:   Recent Labs Lab 10/19/15 1124 10/19/15 1423 10/19/15 2200 10/20/15 0426  NA 123* 123* 125* 129*  K 2.9*  --  3.0* 3.1*  CL 86*  --   --  94*  CO2 24  --   --  26  GLUCOSE 89  --   --  94  BUN 44*  --   --  28*  CREATININE 1.36*  --   --  1.18  CALCIUM 9.2  --   --  8.6*  MG  --   --   --  1.9     CBC:  Recent Labs Lab 10/19/15 1124 10/20/15 0426  WBC  2.3* 2.5*  HGB 8.3* 7.3*  HCT 24.2* 20.7*  MCV 97.8 97.0  PLT 172 148*      Microbiology:  No results found for this or any previous visit (from the past 720 hour(s)).  Coagulation Studies: No results for input(s): LABPROT, INR in the last 72 hours.  Urinalysis:  Recent Labs  10/19/15 1345  COLORURINE YELLOW*  LABSPEC 1.012  PHURINE 6.0  GLUCOSEU NEGATIVE  HGBUR NEGATIVE  BILIRUBINUR NEGATIVE  KETONESUR 1+*  PROTEINUR NEGATIVE  NITRITE NEGATIVE  LEUKOCYTESUR NEGATIVE      Imaging: Ct Renal Stone Study  10/19/2015  CLINICAL DATA:  Left side pain EXAM: CT ABDOMEN AND PELVIS WITHOUT CONTRAST TECHNIQUE: Multidetector CT imaging of the abdomen and pelvis was performed following the standard protocol without IV contrast. COMPARISON:  None. FINDINGS: Old bilateral rib fractures are noted. Lung bases are unremarkable. Unenhanced liver shows no biliary ductal dilatation. No calcified gallstones are noted within gallbladder. No aortic aneurysm. Moderate stool noted in right colon. No pericecal inflammation. Normal appendix. Unenhanced kidneys are  symmetrical in size. No nephrolithiasis. No hydronephrosis or hydroureter. No calcified ureteral calculi. There is no small bowel obstruction. No ascites or free air. No adenopathy. Prostate gland and seminal vesicles are unremarkable. Some colonic gas noted in transverse colon and proximal sigmoid colon. Moderate stool noted in descending colon. Some colonic stool noted within rectum. No calcified calculi are noted within urinary bladder. Mild anasarca infiltration of subcutaneous fat in left pelvic wall see axial image 75. Mild degenerative changes thoracolumbar spine. IMPRESSION: 1. There is no nephrolithiasis.  No hydronephrosis or hydroureter. 2. No calcified ureteral calculi. 3. Moderate stool noted in right colon and descending colon. No colonic obstruction. Normal appendix. No pericecal inflammation. 4. No small bowel obstruction. 5. No  calcified calculi are noted within urinary bladder. Electronically Signed   By: Lahoma Crocker M.D.   On: 10/19/2015 13:21     Medications:   . sodium chloride 125 mL/hr at 10/20/15 0625   . amLODipine  5 mg Oral Daily  . carbamazepine  200 mg Oral Q1200  . carbamazepine  400 mg Oral BID  . divalproex  1,000 mg Oral BID  . divalproex  500 mg Oral Q1200  . docusate sodium  100 mg Oral BID  . enoxaparin (LOVENOX) injection  40 mg Subcutaneous Q24H  . pantoprazole  40 mg Oral QAC breakfast  . potassium chloride  40 mEq Oral BID  . senna  1 tablet Oral BID  . sodium chloride  3 mL Intravenous Q12H   acetaminophen **OR** acetaminophen, bisacodyl, butalbital-acetaminophen-caffeine, morphine injection, ondansetron **OR** ondansetron (ZOFRAN) IV  Assessment/ Plan:  54 y.o.caucasian male with hypertension, seizure disorder, chronic speech disorder, CKD stage II, anemia of CKD, SHPTH  1. Hyponatremia 2. Hypokalemia 3. Status post fall 4. Acute renal failure 5. Chronic kidney disease stage II  Plan: Serum creatinine and electrolytes are improving with IV fluid supplementation and potassium supplementation Patient's friend/neighbor reports that his oral intake is very poor Patient needs assistance with daily living. Consider group home placement versus assisted living placement. We'll follow along with you Patient's neighbor : Loney Laurence. Phone [phone]   LOS: 1 Mohini Heathcock 11/3/201612:36 PM

## 2015-10-21 DIAGNOSIS — R251 Tremor, unspecified: Secondary | ICD-10-CM

## 2015-10-21 LAB — CBC
HEMATOCRIT: 22.1 % — AB (ref 40.0–52.0)
HEMOGLOBIN: 7.6 g/dL — AB (ref 13.0–18.0)
MCH: 33.8 pg (ref 26.0–34.0)
MCHC: 34.4 g/dL (ref 32.0–36.0)
MCV: 98.3 fL (ref 80.0–100.0)
Platelets: 157 10*3/uL (ref 150–440)
RBC: 2.25 MIL/uL — AB (ref 4.40–5.90)
RDW: 16.8 % — ABNORMAL HIGH (ref 11.5–14.5)
WBC: 2.4 10*3/uL — ABNORMAL LOW (ref 3.8–10.6)

## 2015-10-21 LAB — BASIC METABOLIC PANEL
Anion gap: 4 — ABNORMAL LOW (ref 5–15)
BUN: 17 mg/dL (ref 6–20)
CALCIUM: 8.8 mg/dL — AB (ref 8.9–10.3)
CHLORIDE: 105 mmol/L (ref 101–111)
CO2: 27 mmol/L (ref 22–32)
CREATININE: 1.06 mg/dL (ref 0.61–1.24)
Glucose, Bld: 92 mg/dL (ref 65–99)
POTASSIUM: 3.9 mmol/L (ref 3.5–5.1)
Sodium: 136 mmol/L (ref 135–145)

## 2015-10-21 LAB — SODIUM
SODIUM: 135 mmol/L (ref 135–145)
Sodium: 135 mmol/L (ref 135–145)

## 2015-10-21 MED ORDER — ENSURE ENLIVE PO LIQD: 1.0000 | Freq: Two times a day (BID) | ORAL | Status: DC

## 2015-10-21 NOTE — Progress Notes (Signed)
Physical Therapy Treatment Patient Details Name: [name] MRN: [id number] DOB: 25-Feb-1961 Today's Date: 10/21/2015    History of Present Illness 54 yo male with onset of FTT and falls was admitted with mult bruised areas on sides, shoulder and back.  PMHx:  seizures, tremor, old rib fractures.    PT Comments    Patient displays significant increase in independence with mobility during this session and appears to be returning to his baseline. During this session patient ambulated x 2 bouts, initial bout with significantly decreased gait speed and step length bilaterally. With encouragement on 2nd bout displayed increased step length and speed spontaneously. Patient difficult to keep focused, though he demonstrates appropriate insight regarding directions, sporadic train of thought with responses to questions. Physically this appears to be near his baseline, though shaking/tremors noted in sitting. Patient would benefit from additional PT services to address his balance deficits.   Follow Up Recommendations  Home health PT     Equipment Recommendations  Rolling walker with 5" wheels    Recommendations for Other Services       Precautions / Restrictions Precautions Precautions: Fall Restrictions Weight Bearing Restrictions: No    Mobility  Bed Mobility Overal bed mobility: Independent Bed Mobility: Supine to Sit;Sit to Supine     Supine to sit: Independent Sit to supine: Independent   General bed mobility comments: No deficits noted during this session.   Transfers Overall transfer level: Needs assistance   Transfers: Sit to/from Stand Sit to Stand: Modified independent (Device/Increase time)         General transfer comment: Cued for hand placement, 5x sit to stand in 28 seconds though this appeared to be effort related. Sit to stand transfer itself is quite quick, no balance deficits noted.   Ambulation/Gait Ambulation/Gait assistance: Supervision Ambulation  Distance (Feet): 400 Feet (200' for 2 rounds) Assistive device: Rolling walker (2 wheeled) Gait Pattern/deviations: Step-through pattern;Wide base of support;Decreased step length - right;Decreased step length - left;Shuffle;Decreased dorsiflexion - right;Decreased dorsiflexion - left   Gait velocity interpretation: <1.8 ft/sec, indicative of risk for recurrent falls General Gait Details: Patient initially displays minimal step lengths, however with cuing he increases step lengths. Noted to have hip ER bilaterally, possibly to compensate for lack of DF at the ankles.   Stairs            Wheelchair Mobility    Modified Rankin (Stroke Patients Only)       Balance Overall balance assessment: Needs assistance;History of Falls Sitting-balance support: Feet supported Sitting balance-Leahy Scale: Normal Sitting balance - Comments: No balance deficits in sitting.    Standing balance support: Bilateral upper extremity supported Standing balance-Leahy Scale: Fair Standing balance comment: Patient noted to have tremors or ataxic movements, no loss of balance in ambulation. Patient unable to follow commands for modified DGI. 5x sit to stand in 28 seconds, though this seems related to effort.                    Cognition Arousal/Alertness: Awake/alert Behavior During Therapy: WFL for tasks assessed/performed Overall Cognitive Status: Difficult to assess (Patient appears to have some form of socio-cognitive disorder, odd phrases and scattered thoughts noted throughout session.)                      Exercises General Exercises - Lower Extremity Long Arc Quad: AROM;Both;15 reps Heel Slides: AROM;Both;15 reps Straight Leg Raises: AROM;Both;15 reps Hip Flexion/Marching: AROM;Both;15 reps Other Exercises Other Exercises: Sit to  stand training x 5, supine bridging x 10    General Comments General comments (skin integrity, edema, etc.): Bruising on upper back noted.        Pertinent Vitals/Pain Pain Assessment:  (No pain noted during this session)    Home Living                      Prior Function            PT Goals (current goals can now be found in the care plan section) Acute Rehab PT Goals Patient Stated Goal: To go out to eat.  PT Goal Formulation: With patient Time For Goal Achievement: 11/03/15 Potential to Achieve Goals: Good Progress towards PT goals: Progressing toward goals    Frequency  Min 2X/week    PT Plan Discharge plan needs to be updated    Co-evaluation             End of Session Equipment Utilized During Treatment: Gait belt Activity Tolerance: Patient tolerated treatment well Patient left: in bed;with call bell/phone within reach;with bed alarm set     Time: 3838-1840 PT Time Calculation (min) (ACUTE ONLY): 26 min  Charges:  $Gait Training: 8-22 mins $Therapeutic Exercise: 8-22 mins                    G Codes:      Kerman Passey, PT, DPT    10/21/2015, 12:30 PM

## 2015-10-21 NOTE — Progress Notes (Signed)
Clinical Education officer, museum (CSW) received consult. RN Case Manager is arranging home health. Please reconsult if future social work needs arise. CSW signing off.   Blima Rich, [place] [phone]

## 2015-10-21 NOTE — Discharge Instructions (Signed)
Ensure- dietary supplement twice daily. Hyponatremia Hyponatremia is when the amount of salt (sodium) in your blood is too low. When sodium levels are low, your cells absorb extra water and they swell. The swelling happens throughout the body, but it mostly affects the brain. CAUSES This condition may be caused by:  Heart, kidney, or liver problems.  Thyroid problems.  Adrenal gland problems.  Metabolic conditions, such as syndrome of inappropriate antidiuretic hormone (SIADH).  Severe vomiting and diarrhea.  Certain medicines or illegal drugs.  Dehydration.  Drinking too much water.  Eating a diet that is low in sodium.  Large burns on your body.  Sweating. RISK FACTORS This condition is more likely to develop in people who:  Have long-term (chronic) kidney disease.  Have heart failure.  Have a medical condition that causes frequent or excessive diarrhea.  Have metabolic conditions, such as Addison disease or SIADH.  Take certain medicines that affect the sodium and fluid balance in the blood. Some of these medicine types include:  Diuretics.  NSAIDs.  Some opioid pain medicines.  Some antidepressants.  Some seizure prevention medicines. SYMPTOMS  Symptoms of this condition include:  Nausea and vomiting.  Confusion.  Lethargy.  Agitation.  Headache.  Seizures.  Unconsciousness.  Appetite loss.  Muscle weakness and cramping.  Feeling weak or light-headed.  Having a rapid heart rate.  Fainting, in severe cases. DIAGNOSIS This condition is diagnosed with a medical history and physical exam. You will also have other tests, including:  Blood tests.  Urine tests. TREATMENT Treatment for this condition depends on the cause. Treatment may include:  Fluids given through an IV tube that is inserted into one of your veins.  Medicines to correct the sodium imbalance. If medicines are causing the condition, the medicines will need to be  adjusted.  Limiting water or fluid intake to get the correct sodium balance. HOME CARE INSTRUCTIONS  Take medicines only as directed by your health care provider. Many medicines can make this condition worse. Talk with your health care provider about any medicines that you are currently taking.  Carefully follow a recommended diet as directed by your health care provider.  Carefully follow instructions from your health care provider about fluid restrictions.  Keep all follow-up visits as directed by your health care provider. This is important.  Do not drink alcohol. SEEK MEDICAL CARE IF:  You develop worsening nausea, fatigue, headache, confusion, or weakness.  Your symptoms go away and then return.  You have problems following the recommended diet. SEEK IMMEDIATE MEDICAL CARE IF:  You have a seizure.  You faint.  You have ongoing diarrhea or vomiting.   This information is not intended to replace advice given to you by your health care provider. Make sure you discuss any questions you have with your health care provider.   Document Released: 11/23/2002 Document Revised: 04/19/2015 Document Reviewed: 12/23/2014 Elsevier Interactive Patient Education [employer].

## 2015-10-21 NOTE — Progress Notes (Signed)
MD making rounds. Discharge orders received. Appointment Scheduled. Telemetry Removed. IV removed. Discharge paperwork provided, explained, signed and witnessed. Education handouts provided to patient. No unanswered questions. Escorted via wheelchair by nursing staff. All belongings sent with patient.

## 2015-10-21 NOTE — Care Management (Signed)
Spoke with Mr. Phommachanh at the bedside.Last seen Dr. Lovie Macadamia November 1st. [place] at home that day. Has a rolling walker at home, but doesn't use. Did not go to [facility] when discharged. Followed by [place] until October 23rd. Nursing and aide) Social Worker x 1.  Cancelled several visits per [employer] representative. Takes care of all basic activities of daily living himself, still drives. Melany Guernsey will transport. Shelbie Ammons RN MSN Care Management [phone]

## 2015-10-21 NOTE — Progress Notes (Signed)
Subjective:  Patient is known to our practice from outpatient. He is followed by Dr. Holley Raring. Today, he is doing well. He walked with physical therapist  Electrolytes are mostly corrected. Sodium, potassium now normal Serum creatinine is down to 1.06, GFR greater than 60 Hemoglobin 7.6  Objective:  Vital signs in last 24 hours:  Temp:  [98.2 F (36.8 C)-99.4 F (37.4 C)] 98.2 F (36.8 C) (11/04 0924) Pulse Rate:  [67-86] 82 (11/04 0924) Resp:  [18-20] 18 (11/04 0924) BP: (108-130)/(59-71) 125/71 mmHg (11/04 0924) SpO2:  [100 %] 100 % (11/04 0924)  Weight change:  Filed Weights   10/19/15 1117 10/19/15 1608  Weight: 58.968 kg (130 lb) 53.326 kg (117 lb 9 oz)    Intake/Output:    Intake/Output Summary (Last 24 hours) at 10/21/15 1153 Last data filed at 10/21/15 0945  Gross per 24 hour  Intake 2200.33 ml  Output    850 ml  Net 1350.33 ml     Physical Exam: General:  no acute distress, laying in the bed   HEENT  anicteric   Neck  supple   Pulm/lungs  normal respiratory effort, scattered rhonchi   CVS/Heart  regular, no rub or gallop   Abdomen:   soft, nontender, nondistended   Extremities:  no peripheral edema   Neurologic:  intention tremors   Skin:  some bruising   Access:        Basic Metabolic Panel:   Recent Labs Lab 10/19/15 1124  10/19/15 2200 10/20/15 0426 10/20/15 1409 10/20/15 2157 10/21/15 0624  NA 123*  < > 125* 129* 129* 132* 135  136  K 2.9*  --  3.0* 3.1*  --   --  3.9  CL 86*  --   --  94*  --   --  105  CO2 24  --   --  26  --   --  27  GLUCOSE 89  --   --  94  --   --  92  BUN 44*  --   --  28*  --   --  17  CREATININE 1.36*  --   --  1.18  --   --  1.06  CALCIUM 9.2  --   --  8.6*  --   --  8.8*  MG  --   --   --  1.9  --   --   --   < > = values in this interval not displayed.   CBC:  Recent Labs Lab 10/19/15 1124 10/20/15 0426 10/21/15 0624  WBC 2.3* 2.5* 2.4*  HGB 8.3* 7.3* 7.6*  HCT 24.2* 20.7* 22.1*  MCV 97.8  97.0 98.3  PLT 172 148* 157      Microbiology:  No results found for this or any previous visit (from the past 720 hour(s)).  Coagulation Studies: No results for input(s): LABPROT, INR in the last 72 hours.  Urinalysis:  Recent Labs  10/19/15 1345  COLORURINE YELLOW*  LABSPEC 1.012  PHURINE 6.0  GLUCOSEU NEGATIVE  HGBUR NEGATIVE  BILIRUBINUR NEGATIVE  KETONESUR 1+*  PROTEINUR NEGATIVE  NITRITE NEGATIVE  LEUKOCYTESUR NEGATIVE      Imaging: Ct Renal Stone Study  10/19/2015  CLINICAL DATA:  Left side pain EXAM: CT ABDOMEN AND PELVIS WITHOUT CONTRAST TECHNIQUE: Multidetector CT imaging of the abdomen and pelvis was performed following the standard protocol without IV contrast. COMPARISON:  None. FINDINGS: Old bilateral rib fractures are noted. Lung bases are unremarkable. Unenhanced liver shows no  biliary ductal dilatation. No calcified gallstones are noted within gallbladder. No aortic aneurysm. Moderate stool noted in right colon. No pericecal inflammation. Normal appendix. Unenhanced kidneys are symmetrical in size. No nephrolithiasis. No hydronephrosis or hydroureter. No calcified ureteral calculi. There is no small bowel obstruction. No ascites or free air. No adenopathy. Prostate gland and seminal vesicles are unremarkable. Some colonic gas noted in transverse colon and proximal sigmoid colon. Moderate stool noted in descending colon. Some colonic stool noted within rectum. No calcified calculi are noted within urinary bladder. Mild anasarca infiltration of subcutaneous fat in left pelvic wall see axial image 75. Mild degenerative changes thoracolumbar spine. IMPRESSION: 1. There is no nephrolithiasis.  No hydronephrosis or hydroureter. 2. No calcified ureteral calculi. 3. Moderate stool noted in right colon and descending colon. No colonic obstruction. Normal appendix. No pericecal inflammation. 4. No small bowel obstruction. 5. No calcified calculi are noted within urinary  bladder. Electronically Signed   By: Lahoma Crocker M.D.   On: 10/19/2015 13:21     Medications:   . sodium chloride 125 mL/hr at 10/21/15 0945   . amLODipine  5 mg Oral Daily  . carbamazepine  200 mg Oral Q1200  . carbamazepine  400 mg Oral BID  . divalproex  1,000 mg Oral BID  . divalproex  500 mg Oral Q1200  . docusate sodium  100 mg Oral BID  . enoxaparin (LOVENOX) injection  40 mg Subcutaneous Q24H  . feeding supplement (ENSURE ENLIVE)  237 mL Oral BID BM  . pantoprazole  40 mg Oral QAC breakfast  . potassium chloride  40 mEq Oral BID  . senna  1 tablet Oral BID  . sodium chloride  3 mL Intravenous Q12H   acetaminophen **OR** acetaminophen, bisacodyl, butalbital-acetaminophen-caffeine, morphine injection, ondansetron **OR** ondansetron (ZOFRAN) IV  Assessment/ Plan:  54 y.o.caucasian male with hypertension, seizure disorder, chronic speech disorder, CKD stage II, anemia of CKD, SHPTH  1. Hyponatremia 2. Hypokalemia 3. Status post fall 4. Acute renal failure 5. Chronic kidney disease stage II  Plan: Serum creatinine and electrolytes are improving with IV fluid supplementation and potassium supplementation Patient's friend/neighbor reports that his oral intake is very poor Patient denied assisted living or nursing home placement Patient's neighbor : Loney Laurence. Phone [phone] Follow-up outpatient   LOS: 2 Jazlen Ogarro 11/4/201611:53 AM

## 2015-10-21 NOTE — Consult Note (Signed)
CC: arm tremor   HPI: [name] is an 54 y.o. male with a known history of hypertension, seizures, chronic kidney disease, anemia, migraines, chronic hyponatremia who presents to the emergency room with complaint of having abdominal pain. In the lower abdomen.   Neurology consulted for uncontrolled tremor. Which appears to be worse with intention and has been going on for decades.    Past Medical History  Diagnosis Date  . Hypertension   . Seizures ([place])   . Kidney disease   . Anemia   . Migraine   . Hyponatremia     chronic    Past Surgical History  Procedure Laterality Date  . None      Family History  Problem Relation Age of Onset  . Heart attack Father   . Kidney disease Father     Social History:  reports that he has never smoked. He does not have any smokeless tobacco history on file. He reports that he does not drink alcohol or use illicit drugs.  No Known Allergies  Medications: I have reviewed the patient's current medications.  ROS: History obtained from the patient  General ROS: negative for - chills, fatigue, fever, night sweats, weight gain or weight loss Psychological ROS: negative for - behavioral disorder, hallucinations, memory difficulties, mood swings or suicidal ideation Ophthalmic ROS: negative for - blurry vision, double vision, eye pain or loss of vision ENT ROS: negative for - epistaxis, nasal discharge, oral lesions, sore throat, tinnitus or vertigo Allergy and Immunology ROS: negative for - hives or itchy/watery eyes Hematological and Lymphatic ROS: negative for - bleeding problems, bruising or swollen lymph nodes Endocrine ROS: negative for - galactorrhea, hair pattern changes, polydipsia/polyuria or temperature intolerance Respiratory ROS: negative for - cough, hemoptysis, shortness of breath or wheezing Cardiovascular ROS: negative for - chest pain, dyspnea on exertion, edema or irregular heartbeat Gastrointestinal ROS: abdominal pain  Genito-Urinary ROS: renal impairment Musculoskeletal ROS: negative for - joint swelling or muscular weakness Neurological ROS: as noted in HPI Dermatological ROS: negative for rash and skin lesion changes  Physical Examination: Blood pressure 125/80, pulse 96, temperature 98.2 F (36.8 C), temperature source Oral, resp. rate 18, height 5\' 2"  (1.575 m), weight 117 lb 9 oz (53.326 kg), SpO2 99 %.   Neurological Examination Mental Status: Alert, oriented, thought content appropriate.  Speech fluent without evidence of aphasia.  Able to follow 3 step commands without difficulty. Cranial Nerves: II: Discs flat bilaterally; Visual fields grossly normal, pupils equal, round, reactive to light and accommodation III,IV, VI: ptosis not present, extra-ocular motions intact bilaterally V,VII: smile symmetric, facial light touch sensation normal bilaterally VIII: hearing normal bilaterally IX,X: gag reflex present XI: bilateral shoulder shrug XII: midline tongue extension Motor: Right : Upper extremity   4+/5    Left:     Upper extremity   4+/5  Lower extremity   4+/5     Lower extremity   4+/5 Tone and bulk:normal tone throughout; no atrophy noted Sensory: Pinprick and light touch intact throughout, bilaterally Deep Tendon Reflexes:1+       Laboratory Studies:   Basic Metabolic Panel:  Recent Labs Lab 10/19/15 1124  10/19/15 2200 10/20/15 0426 10/20/15 1409 10/20/15 2157 10/21/15 0624  NA 123*  < > 125* 129* 129* 132* 135  136  K 2.9*  --  3.0* 3.1*  --   --  3.9  CL 86*  --   --  94*  --   --  105  CO2  24  --   --  26  --   --  27  GLUCOSE 89  --   --  94  --   --  92  BUN 44*  --   --  28*  --   --  17  CREATININE 1.36*  --   --  1.18  --   --  1.06  CALCIUM 9.2  --   --  8.6*  --   --  8.8*  MG  --   --   --  1.9  --   --   --   < > = values in this interval not displayed.  Liver Function Tests:  Recent Labs Lab 10/19/15 1124  AST 73*  ALT 47  ALKPHOS 65  BILITOT  1.4*  PROT 6.5  ALBUMIN 3.7    Recent Labs Lab 10/19/15 1124  LIPASE 39   No results for input(s): AMMONIA in the last 168 hours.  CBC:  Recent Labs Lab 10/19/15 1124 10/20/15 0426 10/21/15 0624  WBC 2.3* 2.5* 2.4*  HGB 8.3* 7.3* 7.6*  HCT 24.2* 20.7* 22.1*  MCV 97.8 97.0 98.3  PLT 172 148* 157    Cardiac Enzymes: No results for input(s): CKTOTAL, CKMB, CKMBINDEX, TROPONINI in the last 168 hours.  BNP: Invalid input(s): POCBNP  CBG: No results for input(s): GLUCAP in the last 168 hours.  Microbiology: Results for orders placed or performed in visit on 11/26/13  Urine culture     Status: None   Collection Time: 11/27/13  5:05 AM  Result Value Ref Range Status   Micro Text Report   Final       SOURCE: CLEAN CATCH    COMMENT                   MIXED BACTERIAL ORGANISMS   COMMENT                   RESULTS SUGGESTIVE OF CONTAMINATION   ANTIBIOTIC                                                      Clostridium Difficile [facility])     Status: None   Collection Time: 11/29/13  9:51 AM  Result Value Ref Range Status   Micro Text Report   Final       C.DIFFICILE ANTIGEN       C.DIFFICILE GDH ANTIGEN : NEGATIVE   C.DIFFICILE TOXIN A/B     C.DIFFICILE TOXINS A AND B : NEGATIVE   INTERPRETATION            Negative for C. difficile.    ANTIBIOTIC                                                        Coagulation Studies: No results for input(s): LABPROT, INR in the last 72 hours.  Urinalysis:  Recent Labs Lab 10/19/15 1345  COLORURINE YELLOW*  LABSPEC 1.012  PHURINE 6.0  GLUCOSEU NEGATIVE  HGBUR NEGATIVE  BILIRUBINUR NEGATIVE  KETONESUR 1+*  PROTEINUR NEGATIVE  NITRITE NEGATIVE  LEUKOCYTESUR NEGATIVE    Lipid Panel:     Component  Value Date/Time   CHOL 196 11/27/2013 0512   TRIG 219* 11/27/2013 0512   HDL 29* 11/27/2013 0512   VLDL 44* 11/27/2013 0512   LDLCALC 123* 11/27/2013 0512    HgbA1C: No results found for: HGBA1C  Urine Drug  Screen:     Component Value Date/Time   LABOPIA NONE DETECTED 10/19/2015 1345   LABBENZ NONE DETECTED 10/19/2015 1345   AMPHETMU NONE DETECTED 10/19/2015 1345   THCU NONE DETECTED 10/19/2015 1345   LABBARB NONE DETECTED 10/19/2015 1345    Alcohol Level:  Recent Labs Lab 10/19/15 1124  ETH <5     Imaging: Ct Renal Stone Study  10/19/2015  CLINICAL DATA:  Left side pain EXAM: CT ABDOMEN AND PELVIS WITHOUT CONTRAST TECHNIQUE: Multidetector CT imaging of the abdomen and pelvis was performed following the standard protocol without IV contrast. COMPARISON:  None. FINDINGS: Old bilateral rib fractures are noted. Lung bases are unremarkable. Unenhanced liver shows no biliary ductal dilatation. No calcified gallstones are noted within gallbladder. No aortic aneurysm. Moderate stool noted in right colon. No pericecal inflammation. Normal appendix. Unenhanced kidneys are symmetrical in size. No nephrolithiasis. No hydronephrosis or hydroureter. No calcified ureteral calculi. There is no small bowel obstruction. No ascites or free air. No adenopathy. Prostate gland and seminal vesicles are unremarkable. Some colonic gas noted in transverse colon and proximal sigmoid colon. Moderate stool noted in descending colon. Some colonic stool noted within rectum. No calcified calculi are noted within urinary bladder. Mild anasarca infiltration of subcutaneous fat in left pelvic wall see axial image 75. Mild degenerative changes thoracolumbar spine. IMPRESSION: 1. There is no nephrolithiasis.  No hydronephrosis or hydroureter. 2. No calcified ureteral calculi. 3. Moderate stool noted in right colon and descending colon. No colonic obstruction. Normal appendix. No pericecal inflammation. 4. No small bowel obstruction. 5. No calcified calculi are noted within urinary bladder. Electronically Signed   By: Lahoma Crocker M.D.   On: 10/19/2015 13:21     Assessment/Plan:  54 y.o. male with a known history of hypertension,  seizures, chronic kidney disease, anemia, migraines, chronic hyponatremia who presents to the emergency room with complaint of having abdominal pain. In the lower abdomen.   Neurology consulted for uncontrolled tremor. Which appears to be worse with intention and has been going on for decades.    Tremor is distractible, worse with movement and intention. Pt states it has been going on for years  Probably a component of essential tremor.  It does not appear to bother the pt.  Would not treat at this time. No need for further imagine from neuro stand point.    Leotis Pain  10/21/2015, 12:23 PM

## 2015-10-21 NOTE — Plan of Care (Signed)
Problem: Education: Goal: Knowledge of [facility] Education information/materials will improve Outcome: Progressing Pt with history of falls. Education on using call bell to request help.   Problem: Safety: Goal: Ability to remain free from injury will improve Outcome: Progressing Pt has severe tremors and hx of seizures. Patient has a history of falls at home with bruising on his back and shoulder.  Pt compliant with calling for assistance. Bed in lowest position with wheels locked, 2 side rails raised, call bell within reach, bed alarm on.  Problem: Fluid Volume: Goal: Ability to maintain a balanced intake and output will improve Outcome: Progressing Patient has had 3 admissions within the past year for chronic hyponatremia. He remains on IVF at 125 mL/hr.Hyponatremia is slowly resolving.

## 2015-10-21 NOTE — Care Management (Signed)
Admitted to [facility] with the diagnosis of hyponatremia. Discharged from this facility 09/07/15. Friend is Terrilee Files [phone] and Fara Olden [phone]).. Lives in an apartment complex. Sees Dr. Lovie Macadamia as primary care physician. Information faxed to [facility] 09/07/15 for physical therapy. [facility] arranged when discharged last visit. Telephone call to Floydene Flock, [facility] representative to discuss discharge plans. Physical therapy re-evaluation. Recommends home with home health.  Mr. Shughart sleeping. Will discuss plans when he is awake. Shelbie Ammons RN MSN CCM Care Management [phone]

## 2015-10-21 NOTE — Discharge Summary (Signed)
[place] [facility] [place] NAME: [name]    MR#:  [id number]  DATE OF BIRTH:  29-Jan-1961  DATE OF ADMISSION:  10/19/2015 ADMITTING PHYSICIAN: Devin Flock, MD  DATE OF DISCHARGE: 10/21/2015  PRIMARY CARE PHYSICIAN: Devin Pitch, MD    ADMISSION DIAGNOSIS:  Hyponatremia [E87.1] Abdominal Bailey, unspecified abdominal location [R10.9] Anemia, unspecified anemia type [D64.9]  DISCHARGE DIAGNOSIS:  Active Problems:   Hyponatremia   SECONDARY DIAGNOSIS:   Past Medical History  Diagnosis Date  . Hypertension   . Seizures ([place])   . Kidney disease   . Anemia   . Migraine   . Hyponatremia     chronic    HOSPITAL COURSE:   Patient is a 54 year old male with history of chronic hyponatremia presents to the emergency room with abdominal Bailey  1. Acute on chronic hyponatremia likely due to dehydration and poor by mouth intake:  IV normal saline follow sodium level  Called nephrology consult due to recurrent problem.      Sodium level improved after IV fluids.  2. Abdominal Bailey: Likely related to constipation as seen on the CT scan .    Symptoms resolved now.  3. Hypertension , cont amlodipin.  4. Seizure disorder continue Tegretol and Depakote.   Had abnormal movements- for many years- seen by neurologist- no new actions required.  5. Miscellaneous we will do Lovenox for DVT prophylaxis   DISCHARGE CONDITIONS:   Stable.  CONSULTS OBTAINED:  Treatment Team:  Devin Iba, MD Devin Pain, MD  DRUG ALLERGIES:  No Known Allergies  DISCHARGE MEDICATIONS:   Current Discharge Medication List    START taking these medications   Details  feeding supplement, ENSURE ENLIVE, (ENSURE ENLIVE) LIQD Take 237 mLs by mouth 2 (two) times daily between meals. Qty: 10 Bottle, Refills: 12      CONTINUE these medications which have NOT CHANGED   Details  amLODipine (NORVASC) 5 MG tablet Take 5 mg by mouth daily.     butalbital-acetaminophen-caffeine (FIORICET, ESGIC) 50-325-40 MG per tablet Take 1-2 tablets by mouth every 4 (four) hours as needed for migraine.    carbamazepine (TEGRETOL) 200 MG tablet Take 200-400 mg by mouth 3 (three) times daily. Pt takes two in the morning, one at noon, and two at bedtime.    divalproex (DEPAKOTE) 500 MG DR tablet Take 500-1,000 mg by mouth 3 (three) times daily. Pt takes two in the morning, one at noon, and two at bedtime.    ibuprofen (ADVIL,MOTRIN) 600 MG tablet Take 1 tablet (600 mg total) by mouth every 8 (eight) hours as needed. Qty: 20 tablet, Refills: 0    lisinopril (PRINIVIL,ZESTRIL) 20 MG tablet Take 20 mg by mouth daily.    pantoprazole (PROTONIX) 40 MG tablet Take 1 tablet (40 mg total) by mouth daily. Qty: 30 tablet, Refills: 2    propranolol (INDERAL) 20 MG tablet Take 40 mg by mouth 2 (two) times daily.         DISCHARGE INSTRUCTIONS:    Follow with PMD and nephrologist in office in 1-2 weeks.  If you experience worsening of your admission symptoms, develop shortness of breath, life threatening emergency, suicidal or homicidal thoughts you must seek medical attention immediately by calling 911 or calling your MD immediately  if symptoms less severe.  You Must read complete instructions/literature along with all the possible adverse reactions/side effects for all the Medicines you take and that have been prescribed to you. Take any new  Medicines after you have completely understood and accept all the possible adverse reactions/side effects.   Please note  You were cared for by a hospitalist during your hospital stay. If you have any questions about your discharge medications or the care you received while you were in the hospital after you are discharged, you can call the unit and asked to speak with the hospitalist on call if the hospitalist that took care of you is not available. Once you are discharged, your primary care physician will  handle any further medical issues. Please note that NO REFILLS for any discharge medications will be authorized once you are discharged, as it is imperative that you return to your primary care physician (or establish a relationship with a primary care physician if you do not have one) for your aftercare needs so that they can reassess your need for medications and monitor your lab values.    Today   CHIEF COMPLAINT:   Chief Complaint  Patient presents with  . Failure To Thrive  . Abdominal Bailey    HISTORY OF PRESENT ILLNESS:  [name]  is a 54 y.o. male with a known history of hypertension, seizures, chronic kidney disease, anemia, migraines, chronic hyponatremia who presents to the emergency room with complaint of having abdominal Bailey. In the lower abdomen. He describes it as a sharp type of Bailey. Patient is a very poor historian and unable to provide any meaningful history he is very tangential in his communication.  Patient reports that he has had decrease in by mouth intake over the past 3 days. He had a renal CT protocol. Which did show moderate amount of stool.  VITAL SIGNS:  Blood pressure 125/80, pulse 96, temperature 98.2 F (36.8 C), temperature source Oral, resp. rate 18, height 5\' 2"  (1.575 m), weight 53.326 kg (117 lb 9 oz), SpO2 99 %.  I/O:   Intake/Output Summary (Last 24 hours) at 10/21/15 1300 Last data filed at 10/21/15 0945  Gross per 24 hour  Intake 2200.33 ml  Output    850 ml  Net 1350.33 ml    PHYSICAL EXAMINATION:   GENERAL: 54 y.o.-year-old patient lying in the bed with no acute distress.  EYES: Pupils equal, round, reactive to light and accommodation. No scleral icterus. Extraocular muscles intact.  HEENT: Head atraumatic, normocephalic. Oropharynx and nasopharynx clear.  NECK: Supple, no jugular venous distention. No thyroid enlargement, no tenderness.  LUNGS: Normal breath sounds bilaterally, no wheezing, rales,rhonchi or crepitation. No  use of accessory muscles of respiration.  CARDIOVASCULAR: S1, S2 normal. No murmurs, rubs, or gallops.  ABDOMEN: Soft, mild lower abdomen tenderness, nondistended. Bowel sounds present. No organomegaly or mass.  EXTREMITIES: No pedal edema, cyanosis, or clubbing.  NEUROLOGIC: Cranial nerves II through XII are intact. Muscle strength 5/5 in all extremities. Sensation intact. Gait not checked. he have some tics like involuntary movements. PSYCHIATRIC: The patient is alert and oriented x 2. Mildly anxious SKIN: No obvious rash, lesion, or ulcer.   DATA REVIEW:   CBC  Recent Labs Lab 10/21/15 0624  WBC 2.4*  HGB 7.6*  HCT 22.1*  PLT 157    Chemistries   Recent Labs Lab 10/19/15 1124  10/20/15 0426  10/21/15 0624  NA 123*  < > 129*  < > 135  136  K 2.9*  < > 3.1*  --  3.9  CL 86*  --  94*  --  105  CO2 24  --  26  --  27  GLUCOSE 89  --  94  --  92  BUN 44*  --  28*  --  17  CREATININE 1.36*  --  1.18  --  1.06  CALCIUM 9.2  --  8.6*  --  8.8*  MG  --   --  1.9  --   --   AST 73*  --   --   --   --   ALT 47  --   --   --   --   ALKPHOS 65  --   --   --   --   BILITOT 1.4*  --   --   --   --   < > = values in this interval not displayed.  Cardiac Enzymes No results for input(s): TROPONINI in the last 168 hours.  Microbiology Results  Results for orders placed or performed in visit on 11/26/13  Urine culture     Status: None   Collection Time: 11/27/13  5:05 AM  Result Value Ref Range Status   Micro Text Report   Final       SOURCE: CLEAN CATCH    COMMENT                   MIXED BACTERIAL ORGANISMS   COMMENT                   RESULTS SUGGESTIVE OF CONTAMINATION   ANTIBIOTIC                                                      Clostridium Difficile [facility])     Status: None   Collection Time: 11/29/13  9:51 AM  Result Value Ref Range Status   Micro Text Report   Final       C.DIFFICILE ANTIGEN       C.DIFFICILE GDH ANTIGEN : NEGATIVE   C.DIFFICILE TOXIN  A/B     C.DIFFICILE TOXINS A AND B : NEGATIVE   INTERPRETATION            Negative for C. difficile.    ANTIBIOTIC                                                        RADIOLOGY:  Ct Renal Stone Study  10/19/2015  CLINICAL DATA:  Left side Bailey EXAM: CT ABDOMEN AND PELVIS WITHOUT CONTRAST TECHNIQUE: Multidetector CT imaging of the abdomen and pelvis was performed following the standard protocol without IV contrast. COMPARISON:  None. FINDINGS: Old bilateral rib fractures are noted. Lung bases are unremarkable. Unenhanced liver shows no biliary ductal dilatation. No calcified gallstones are noted within gallbladder. No aortic aneurysm. Moderate stool noted in right colon. No pericecal inflammation. Normal appendix. Unenhanced kidneys are symmetrical in size. No nephrolithiasis. No hydronephrosis or hydroureter. No calcified ureteral calculi. There is no small bowel obstruction. No ascites or free air. No adenopathy. Prostate gland and seminal vesicles are unremarkable. Some colonic gas noted in transverse colon and proximal sigmoid colon. Moderate stool noted in descending colon. Some colonic stool noted within rectum. No calcified calculi are noted within urinary bladder. Mild anasarca infiltration of subcutaneous fat  in left pelvic wall see axial image 75. Mild degenerative changes thoracolumbar spine. IMPRESSION: 1. There is no nephrolithiasis.  No hydronephrosis or hydroureter. 2. No calcified ureteral calculi. 3. Moderate stool noted in right colon and descending colon. No colonic obstruction. Normal appendix. No pericecal inflammation. 4. No small bowel obstruction. 5. No calcified calculi are noted within urinary bladder. Electronically Signed   By: Lahoma Crocker M.D.   On: 10/19/2015 13:21     Management plans discussed with the patient, family and they are in agreement.  CODE STATUS:     Code Status Orders        Start     Ordered   10/19/15 1547  Full code   Continuous     10/19/15  1546    Advance Directive Documentation        Most Recent Value   Type of Advance Directive  Living will   Pre-existing out of facility DNR order (yellow form or pink MOST form)     "MOST" Form in Place?        TOTAL TIME TAKING CARE OF THIS PATIENT: 35 minutes.    Vaughan Basta M.D on 10/21/2015 at 1:00 PM  Between 7am to 6pm - Pager - [phone]  After 6pm go to [email or web address] - password [email or web address] [facility]  Office  [phone]  CC: Primary care physician; Devin Pitch, MD   Note: This dictation was prepared with Dragon dictation along with smaller phrase technology. Any transcriptional errors that result from this process are unintentional.

## 2015-10-23 ENCOUNTER — Emergency Department: Payer: Self-pay

## 2015-10-23 ENCOUNTER — Emergency Department
Admission: EM | Admit: 2015-10-23 | Discharge: 2015-10-23 | Disposition: A | Discharge status: Home / Self Care | Payer: Self-pay | Attending: Emergency Medicine | Admitting: Emergency Medicine

## 2015-10-23 ENCOUNTER — Encounter: Payer: Self-pay | Admitting: *Deleted

## 2015-10-23 DIAGNOSIS — I129 Hypertensive chronic kidney disease with stage 1 through stage 4 chronic kidney disease, or unspecified chronic kidney disease: Secondary | ICD-10-CM | POA: Insufficient documentation

## 2015-10-23 DIAGNOSIS — N189 Chronic kidney disease, unspecified: Secondary | ICD-10-CM | POA: Insufficient documentation

## 2015-10-23 DIAGNOSIS — K5900 Constipation, unspecified: Secondary | ICD-10-CM | POA: Insufficient documentation

## 2015-10-23 DIAGNOSIS — E871 Hypo-osmolality and hyponatremia: Secondary | ICD-10-CM | POA: Insufficient documentation

## 2015-10-23 DIAGNOSIS — Z79899 Other long term (current) drug therapy: Secondary | ICD-10-CM | POA: Insufficient documentation

## 2015-10-23 DIAGNOSIS — R109 Unspecified abdominal pain: Secondary | ICD-10-CM

## 2015-10-23 LAB — COMPREHENSIVE METABOLIC PANEL
ALK PHOS: 75 U/L (ref 38–126)
ALT: 35 U/L (ref 17–63)
ANION GAP: 6 (ref 5–15)
AST: 39 U/L (ref 15–41)
Albumin: 3.8 g/dL (ref 3.5–5.0)
BUN: 20 mg/dL (ref 6–20)
CALCIUM: 9.4 mg/dL (ref 8.9–10.3)
CO2: 26 mmol/L (ref 22–32)
Chloride: 96 mmol/L — ABNORMAL LOW (ref 101–111)
Creatinine, Ser: 1.11 mg/dL (ref 0.61–1.24)
GFR calc non Af Amer: >60 mL/min (ref >60)
Glucose, Bld: 108 mg/dL — ABNORMAL HIGH (ref 65–99)
Potassium: 4.1 mmol/L (ref 3.5–5.1)
SODIUM: 128 mmol/L — AB (ref 135–145)
TOTAL PROTEIN: 6.7 g/dL (ref 6.5–8.1)
Total Bilirubin: 0.8 mg/dL (ref 0.3–1.2)

## 2015-10-23 LAB — CBC
HCT: 23.6 % — ABNORMAL LOW (ref 40.0–52.0)
HEMOGLOBIN: 7.9 g/dL — AB (ref 13.0–18.0)
MCH: 32.9 pg (ref 26.0–34.0)
MCHC: 33.3 g/dL (ref 32.0–36.0)
MCV: 98.7 fL (ref 80.0–100.0)
Platelets: 193 10*3/uL (ref 150–440)
RBC: 2.39 MIL/uL — AB (ref 4.40–5.90)
RDW: 16.9 % — ABNORMAL HIGH (ref 11.5–14.5)
WBC: 2.5 10*3/uL — ABNORMAL LOW (ref 3.8–10.6)

## 2015-10-23 LAB — LIPASE, BLOOD: LIPASE: 49 U/L (ref 11–51)

## 2015-10-23 MED ORDER — SODIUM CHLORIDE 0.9 % IV BOLUS (SEPSIS)
1000.0000 mL | Freq: Once | INTRAVENOUS | Status: AC
  Administered 2015-10-23: 1000 mL via INTRAVENOUS

## 2015-10-23 MED ORDER — PEG 3350-KCL-NABCB-NACL-NASULF 236 G PO SOLR: 1.0000 L | Freq: Once | ORAL | Status: AC

## 2015-10-23 NOTE — Discharge Instructions (Signed)
Do not drink too much water in the day, makes it with electrolyte containing solutions or you will have low sodium. If you have increased abdominal pain, fever, vomiting, bleeding from your bottom refill worse in any way return to the emergency department. Follow closely with her doctor for your low blood counts and your abdominal pain.   Abdominal Pain, Adult Many things can cause belly (abdominal) pain. Most times, the belly pain is not dangerous. Many cases of belly pain can be watched and treated at home. HOME CARE   Do not take medicines that help you go poop (laxatives) unless told to by your doctor.  Only take medicine as told by your doctor.  Eat or drink as told by your doctor. Your doctor will tell you if you should be on a special diet. GET HELP IF:  You do not know what is causing your belly pain.  You have belly pain while you are sick to your stomach (nauseous) or have runny poop (diarrhea).  You have pain while you pee or poop.  Your belly pain wakes you up at night.  You have belly pain that gets worse or better when you eat.  You have belly pain that gets worse when you eat fatty foods.  You have a fever. GET HELP RIGHT AWAY IF:   The pain does not go away within 2 hours.  You keep throwing up (vomiting).  The pain changes and is only in the right or left part of the belly.  You have bloody or tarry looking poop. MAKE SURE YOU:   Understand these instructions.  Will watch your condition.  Will get help right away if you are not doing well or get worse.   This information is not intended to replace advice given to you by your health care provider. Make sure you discuss any questions you have with your health care provider.   Document Released: 05/21/2008 Document Revised: 12/24/2014 Document Reviewed: 08/12/2013 Elsevier Interactive Patient Education [employer].

## 2015-10-23 NOTE — ED Provider Notes (Addendum)
[facility] Emergency Department Provider Note  ____________________________________________   I have reviewed the triage vital signs and the nursing notes.   HISTORY  Chief Complaint Abdominal Pain and Weakness    HPI [name] is a 54 y.o. 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 Patient states he has left-sided abdominal pain for the last week or so. Patient had a negative CT and a negative workup for this as an inpatient in the hospital. He was diagnosed with constipation. He states he has not had a satisfying bowel movements to 1 home yesterday. Denies any nausea vomiting or fever. He states he drank a half a gallon of water. He has had no recent falls no chest pain or shortness of breath no hematemesis no vomiting no fever no chills. Patient has a baseline tremor.  Past Medical History  Diagnosis Date  . Hypertension   . Seizures ([place])   . Kidney disease   . Anemia   . Migraine   . Hyponatremia     chronic    Patient Active Problem List   Diagnosis Date Noted  . Protein-calorie malnutrition, severe ([place]) 09/07/2015  . Hyponatremia 09/05/2015  . Elevated troponin 09/05/2015  . Hypokalemia 09/05/2015  . Leukopenia 09/05/2015  . Renal insufficiency 09/05/2015  . Tremor, essential 07/15/2015  . Essential hypertension 07/15/2015  . Chronic renal insufficiency 07/15/2015  . Leg swelling 07/15/2015  . Chest pain 04/17/2015    Past Surgical History  Procedure Laterality Date  . None      Current Outpatient Rx  Name  Route  Sig  Dispense  Refill  . amLODipine (NORVASC) 5 MG tablet   Oral   Take 5 mg by mouth daily.         . butalbital-acetaminophen-caffeine (FIORICET, ESGIC) 50-325-40 MG per tablet   Oral   Take 1-2 tablets by mouth every 4 (four) hours as needed for  migraine.         . carbamazepine (TEGRETOL) 200 MG tablet   Oral   Take 200-400 mg by mouth 3 (three) times daily. Pt takes two in the morning, one at noon, and two at bedtime.         . divalproex (DEPAKOTE) 500 MG DR tablet   Oral   Take 500-1,000 mg by mouth 3 (three) times daily. Pt takes two in the morning, one at noon, and two at bedtime.         . feeding supplement, ENSURE ENLIVE, (ENSURE ENLIVE) LIQD   Oral   Take 237 mLs by mouth 2 (two) times daily between meals.   10 Bottle   12   . ibuprofen (ADVIL,MOTRIN) 600 MG tablet   Oral   Take 1 tablet (600 mg total) by mouth every 8 (eight) hours as needed.   20 tablet   0   . lisinopril (PRINIVIL,ZESTRIL) 20 MG tablet   Oral   Take 20 mg by mouth daily.         . pantoprazole (PROTONIX) 40 MG tablet   Oral   Take 1 tablet (40 mg total) by mouth daily.   30 tablet   2   . propranolol (INDERAL) 20 MG tablet   Oral   Take 40 mg by mouth 2 (two) times daily.           Allergies Review of patient's allergies indicates no known  allergies.  Family History  Problem Relation Age of Onset  . Heart attack Father   . Kidney disease Father     Social History Social History  Substance Use Topics  . Smoking status: Never Smoker   . Smokeless tobacco: None  . Alcohol Use: No    Review of Systems Constitutional: No fever/chills Eyes: No visual changes. ENT: No sore throat. No stiff neck no neck pain Cardiovascular: Denies chest pain. Respiratory: Denies shortness of breath. Gastrointestinal:   no vomiting.  No diarrhea.  No constipation. Genitourinary: Negative for dysuria. Musculoskeletal: Negative lower extremity swelling Skin: Negative for rash. Neurological: Negative for headaches, focal weakness or numbness. 10-point ROS otherwise negative.  ____________________________________________   PHYSICAL EXAM:  VITAL SIGNS: ED Triage Vitals  Enc Vitals Group     BP 10/23/15 1725 162/120 mmHg      Pulse Rate 10/23/15 1725 80     Resp 10/23/15 1725 18     Temp 10/23/15 1725 98.1 F (36.7 C)     Temp Source 10/23/15 1725 Oral     SpO2 10/23/15 1725 94 %     Weight --      Height --      Head Cir --      Peak Flow --      Pain Score --      Pain Loc --      Pain Edu? --      Excl. in [place]? --     Constitutional: Alert and oriented. No acute distress, patient with athetoid baseline motions which are present. Eyes: Conjunctivae are normal. PERRL. EOMI. Head: Atraumatic. Nose: No congestion/rhinnorhea. Mouth/Throat: Mucous membranes are moist.  Oropharynx non-erythematous. Neck: No stridor.   Nontender with no meningismus Cardiovascular: Normal rate, regular rhythm. Grossly normal heart sounds.  Good peripheral circulation. Respiratory: Normal respiratory effort.  No retractions. Lungs CTAB. Abdominal: Soft and nontender. No distention. No guarding no rebound Back:  There is no focal tenderness or step off there is no midline tenderness there are no lesions noted. there is no CVA tenderness Musculoskeletal: No lower extremity tenderness. No joint effusions, no DVT signs strong distal pulses no edema Neurologic:  Normal speech and language. No gross focal neurologic deficits are appreciated.  Skin:  Skin is warm, dry and intact. No rash noted. Psychiatric: Mood and affect are normal. Speech and behavior are normal.  ____________________________________________   LABS (all labs ordered are listed, but only abnormal results are displayed)  Labs Reviewed  COMPREHENSIVE METABOLIC PANEL - Abnormal; Notable for the following:    Sodium 128 (*)    Chloride 96 (*)    Glucose, Bld 108 (*)    All other components within normal limits  CBC - Abnormal; Notable for the following:    WBC 2.5 (*)    RBC 2.39 (*)    Hemoglobin 7.9 (*)    HCT 23.6 (*)    RDW 16.9 (*)    All other components within normal limits  LIPASE, BLOOD   ____________________________________________  EKG  I  personally interpreted any EKGs ordered by me or triage Normal sinus rhythm no acute ST elevation or ST depression normal axis unremarkable EKG rate 77 RS are prime is noted ____________________________________________  RADIOLOGY  I reviewed any imaging ordered by me or triage that were performed during my shift ____________________________________________   PROCEDURES  Procedure(s) performed: None  Critical Care performed: None  ____________________________________________   INITIAL IMPRESSION / ASSESSMENT AND PLAN / ED COURSE  Pertinent  labs & imaging results that were available during my care of the patient were reviewed by me and considered in my medical decision making (see chart for details).  Patient with chronic recurrent hyponatremia likely secondary to drinking a half gallon of water a day or more. Advised her not to do this anymore. He is not dangerously low this time we are giving him IV fluid. Patient also complains of abdominal pain but there is no evidence of acute pathology, his blood work is reassuring, near his baseline. There is no evidence of ongoing GI bleed. He is guaiac-negative at this time. There is no evidence of fecal impaction. Rectal exam is otherwise unremarkable. Serial abdominal exams betray no evidence of acute pathology. I think patient will likely require some assistance and having a bowel movement. He does not want have an enema here. We'll send him home with oral medications she has taken nothing to help prevent this at the house. He states he is eating and drinking well. Vital signs are reassuring. Liver fraction tests are reassuring. Patient is a chronically depleted white count and CBC and his doctor will follow-up on this this is not different from it has been over the month. Patient is a very frequent visitor to the emergency department. She will 11 is stable for discharge. ____________________________________________   Patient with no abdominal  pain or tenderness tolerating by mouth. He is complaining his constipation but he has not been on any current regimen. He would prefer not to try an enema here. He preferred not to be admitted. I do not think a repeat CT is necessary given his exam and recent CT scan and patient would prefer not to have one. His eager to go home. We'll discharge him with a good bowel prep for his constipation and close follow-up with his doctor for his abnormal blood work. Patient already has home health assigned to him.  FINAL CLINICAL IMPRESSION(S) / ED DIAGNOSES  Final diagnoses:  Abdominal pain      Schuyler Amor, MD 10/23/15 [place], MD 10/23/15 2006  Schuyler Amor, MD 10/23/15 2011

## 2015-10-23 NOTE — ED Notes (Signed)
Patient made aware of non-emergency expense for EMS transport vs. Taxi service. Patient continues to request EMS transport. Charge RN made aware.

## 2015-10-23 NOTE — ED Notes (Addendum)
Pt to ED via EMS from home with generalized abd pain and weakness, d/c from here x 2 days ago with dehydration. Pt AAOx3 on arrival, vitals all wnl at this time.

## 2015-10-23 NOTE — ED Notes (Signed)
Patient with no complaints at this time. Respirations even and unlabored. Skin warm/dry. Discharge instructions reviewed with patient at this time. Patient given opportunity to voice concerns/ask questions. IV removed per policy and band-aid applied to site. Patient discharged at this time and left Emergency Department, via ambulance.    

## 2015-10-25 ENCOUNTER — Emergency Department
Admission: EM | Admit: 2015-10-25 | Discharge: 2015-10-25 | Disposition: A | Discharge status: Home / Self Care | Payer: Self-pay | Attending: Emergency Medicine | Admitting: Emergency Medicine

## 2015-10-25 ENCOUNTER — Encounter: Payer: Self-pay | Admitting: Emergency Medicine

## 2015-10-25 DIAGNOSIS — N189 Chronic kidney disease, unspecified: Secondary | ICD-10-CM | POA: Insufficient documentation

## 2015-10-25 DIAGNOSIS — G250 Essential tremor: Secondary | ICD-10-CM | POA: Insufficient documentation

## 2015-10-25 DIAGNOSIS — M6281 Muscle weakness (generalized): Secondary | ICD-10-CM | POA: Insufficient documentation

## 2015-10-25 DIAGNOSIS — I129 Hypertensive chronic kidney disease with stage 1 through stage 4 chronic kidney disease, or unspecified chronic kidney disease: Secondary | ICD-10-CM | POA: Insufficient documentation

## 2015-10-25 DIAGNOSIS — Z79899 Other long term (current) drug therapy: Secondary | ICD-10-CM | POA: Insufficient documentation

## 2015-10-25 DIAGNOSIS — E860 Dehydration: Secondary | ICD-10-CM | POA: Insufficient documentation

## 2015-10-25 LAB — CBC WITH DIFFERENTIAL/PLATELET
BASOS ABS: 0 10*3/uL (ref 0–0.1)
BASOS PCT: 1 %
Eosinophils Absolute: 0 10*3/uL (ref 0–0.7)
Eosinophils Relative: 0 %
HEMATOCRIT: 21.9 % — AB (ref 40.0–52.0)
HEMOGLOBIN: 7.4 g/dL — AB (ref 13.0–18.0)
LYMPHS PCT: 26 %
Lymphs Abs: 0.6 10*3/uL — ABNORMAL LOW (ref 1.0–3.6)
MCH: 33.3 pg (ref 26.0–34.0)
MCHC: 34 g/dL (ref 32.0–36.0)
MCV: 98.2 fL (ref 80.0–100.0)
MONO ABS: 0.4 10*3/uL (ref 0.2–1.0)
Monocytes Relative: 19 %
NEUTROS ABS: 1.2 10*3/uL — AB (ref 1.4–6.5)
Neutrophils Relative %: 54 %
Platelets: 168 10*3/uL (ref 150–440)
RBC: 2.23 MIL/uL — AB (ref 4.40–5.90)
RDW: 16.5 % — AB (ref 11.5–14.5)
WBC: 2.3 10*3/uL — ABNORMAL LOW (ref 3.8–10.6)

## 2015-10-25 LAB — BASIC METABOLIC PANEL
ANION GAP: 7 (ref 5–15)
BUN: 19 mg/dL (ref 6–20)
CALCIUM: 9.3 mg/dL (ref 8.9–10.3)
CO2: 28 mmol/L (ref 22–32)
Chloride: 93 mmol/L — ABNORMAL LOW (ref 101–111)
Creatinine, Ser: 1.22 mg/dL (ref 0.61–1.24)
GFR calc Af Amer: >60 mL/min (ref >60)
GFR calc non Af Amer: >60 mL/min (ref >60)
GLUCOSE: 81 mg/dL (ref 65–99)
Potassium: 3.7 mmol/L (ref 3.5–5.1)
Sodium: 128 mmol/L — ABNORMAL LOW (ref 135–145)

## 2015-10-25 MED ORDER — DIVALPROEX SODIUM 500 MG PO DR TAB
1000.0000 mg | DELAYED_RELEASE_TABLET | Freq: Once | ORAL | Status: AC
  Administered 2015-10-25: 1000 mg via ORAL
  Filled 2015-10-25: qty 2

## 2015-10-25 MED ORDER — DIVALPROEX SODIUM 500 MG PO DR TAB: 500.0000 mg | DELAYED_RELEASE_TABLET | Freq: Three times a day (TID) | ORAL | Status: DC

## 2015-10-25 MED ORDER — SODIUM CHLORIDE 0.9 % IV BOLUS (SEPSIS): 1000.0000 mL | Freq: Once | INTRAVENOUS | Status: DC

## 2015-10-25 MED ORDER — SODIUM CHLORIDE 0.9 % IV BOLUS (SEPSIS)
1000.0000 mL | Freq: Once | INTRAVENOUS | Status: AC
  Administered 2015-10-25: 1000 mL via INTRAVENOUS

## 2015-10-25 NOTE — Discharge Instructions (Signed)

## 2015-10-25 NOTE — ED Notes (Signed)
MD at bedside. 

## 2015-10-25 NOTE — ED Provider Notes (Signed)
[facility] Emergency Department Provider Note  ____________________________________________  Time seen: 7:00am  I have reviewed the triage vital signs and the nursing notes.   HISTORY  Chief Complaint Medication Refill    HPI [name] is a 54 y.o. male who comes to the ED complaining of generalized weakness that has prevented him from leaving his house to fill his Depakote prescription. He states the weakness has been going on for 2-3 days. He has multiple visits to our emergency department recently including most recently 2 days ago. He reports that he called around but was unable to find any place that had Depakote that he "couldn't get 2". He reports that he normally gets his medications filled at [facility].Normally uses a walker at home.     Past Medical History  Diagnosis Date  . Hypertension   . Seizures ([place])   . Kidney disease   . Anemia   . Migraine   . Hyponatremia     chronic     Patient Active Problem List   Diagnosis Date Noted  . Protein-calorie malnutrition, severe ([place]) 09/07/2015  . Hyponatremia 09/05/2015  . Elevated troponin 09/05/2015  . Hypokalemia 09/05/2015  . Leukopenia 09/05/2015  . Renal insufficiency 09/05/2015  . Tremor, essential 07/15/2015  . Essential hypertension 07/15/2015  . Chronic renal insufficiency 07/15/2015  . Leg swelling 07/15/2015  . Chest pain 04/17/2015     Past Surgical History  Procedure Laterality Date  . None       Current Outpatient Rx  Name  Route  Sig  Dispense  Refill  . amLODipine (NORVASC) 5 MG tablet   Oral   Take 5 mg by mouth daily.         . butalbital-acetaminophen-caffeine (FIORICET, ESGIC) 50-325-40 MG per tablet   Oral   Take 1-2 tablets by mouth every 4 (four) hours as needed for migraine.         . carbamazepine (TEGRETOL) 200 MG tablet   Oral   Take 200-400 mg by mouth 3 (three) times daily. Pt takes two in the morning, one at noon, and two at bedtime.          . divalproex (DEPAKOTE) 500 MG DR tablet   Oral   Take 500-1,000 mg by mouth 3 (three) times daily. Pt takes two in the morning, one at noon, and two at bedtime.         . feeding supplement, ENSURE ENLIVE, (ENSURE ENLIVE) LIQD   Oral   Take 237 mLs by mouth 2 (two) times daily between meals.   10 Bottle   12   . lisinopril (PRINIVIL,ZESTRIL) 20 MG tablet   Oral   Take 20 mg by mouth daily.         . pantoprazole (PROTONIX) 40 MG tablet   Oral   Take 1 tablet (40 mg total) by mouth daily.   30 tablet   2   . propranolol (INDERAL) 20 MG tablet   Oral   Take 40 mg by mouth 2 (two) times daily.            Allergies Review of patient's allergies indicates no known allergies.   Family History  Problem Relation Age of Onset  . Heart attack Father   . Kidney disease Father     Social History Social History  Substance Use Topics  . Smoking status: Never Smoker   . Smokeless tobacco: None  . Alcohol Use: No    Review of Systems  Constitutional:  No fever or chills. No weight changes. Generalized weakness Eyes:   No blurry vision or double vision.  ENT:   No sore throat. Cardiovascular:   No chest pain. Respiratory:   No dyspnea or cough. Gastrointestinal:   Negative for abdominal pain, vomiting and diarrhea.  No BRBPR or melena. Genitourinary:   Negative for dysuria, urinary retention, bloody urine, or difficulty urinating. Musculoskeletal:   Negative for back pain. No joint swelling or pain. Skin:   Negative for rash. Neurological:   Negative for headaches, focal weakness or numbness. Psychiatric:  No anxiety or depression.   Endocrine:  No hot/cold intolerance, changes in energy, or sleep difficulty.  10-point ROS otherwise negative.  ____________________________________________   PHYSICAL EXAM:  VITAL SIGNS: ED Triage Vitals  Enc Vitals Group     BP 10/25/15 0532 151/89 mmHg     Pulse Rate 10/25/15 0532 74     Resp 10/25/15 0532 18      Temp 10/25/15 0532 98.1 F (36.7 C)     Temp src --      SpO2 10/25/15 0532 98 %     Weight --      Height --      Head Cir --      Peak Flow --      Pain Score --      Pain Loc --      Pain Edu? --      Excl. in [place]? --      Constitutional:   Alert and oriented. Well appearing and in no distress. Eyes:   No scleral icterus. No conjunctival pallor. PERRL. EOMI ENT   Head:   Normocephalic and atraumatic.   Nose:   No congestion/rhinnorhea. No septal hematoma   Mouth/Throat:   MMM, no pharyngeal erythema. No peritonsillar mass. No uvula shift.   Neck:   No stridor. No SubQ emphysema. No meningismus. Hematological/Lymphatic/Immunilogical:   No cervical lymphadenopathy. Cardiovascular:   RRR. Normal and symmetric distal pulses are present in all extremities. No murmurs, rubs, or gallops. Respiratory:   Normal respiratory effort without tachypnea nor retractions. Breath sounds are clear and equal bilaterally. No wheezes/rales/rhonchi. Gastrointestinal:   Soft and nontender. No distention. There is no CVA tenderness.  No rebound, rigidity, or guarding. Genitourinary:   deferred Musculoskeletal:   Nontender with normal range of motion in all extremities. No joint effusions.  No lower extremity tenderness.  No edema. Neurologic:   Normal speech and language.  CN 2-10 normal. Motor grossly intact. No pronator drift.  Shuffling steady gait. Past beating tremor. No gross focal neurologic deficits are appreciated.  Skin:    Skin is warm, dry and intact. No rash noted.  No petechiae, purpura, or bullae. Psychiatric:   Mood and affect are normal. Speech and behavior are normal. Patient is well groomed, but wearing male close as is his norm. No disorders of thought content or process ____________________________________________    LABS (pertinent positives/negatives) (all labs ordered are listed, but only abnormal results are displayed) Labs Reviewed  BASIC METABOLIC  PANEL - Abnormal; Notable for the following:    Sodium 128 (*)    Chloride 93 (*)    All other components within normal limits  CBC WITH DIFFERENTIAL/PLATELET - Abnormal; Notable for the following:    WBC 2.3 (*)    RBC 2.23 (*)    Hemoglobin 7.4 (*)    HCT 21.9 (*)    RDW 16.5 (*)    Neutro Abs 1.2 (*)  Lymphs Abs 0.6 (*)    All other components within normal limits   ____________________________________________   EKG    ____________________________________________    RADIOLOGY    ____________________________________________   PROCEDURES   ____________________________________________   INITIAL IMPRESSION / ASSESSMENT AND PLAN / ED COURSE  Pertinent labs & imaging results that were available during my care of the patient were reviewed by me and considered in my medical decision making (see chart for details).  Patient presents with generalized weakness. Has a history of hyponatremia and hypokalemia so we'll check labs. Otherwise appears to be at his baseline and her presenting complaint appears to be related to essentially wanting help getting his medications filled at [facility] which is [facility]. He is not yet pursued retail pharmacy delivery options due to the expense.   ----------------------------------------- 8:13 AM on 10/25/2015 -----------------------------------------  Remains hemodynamically stable. Labs show very mild hyponatremia, which I think is likely his baseline otherwise labs look fine. We'll give some fluids for IV hydration and plan for discharge home. I'll discuss with him options regarding having a friend pick him up and taken to [place] on the way home versus going home and seeking pharmacy delivery. Either way, patient is stable for outpatient follow-up and discharge home.  ----------------------------------------- 10:34 AM on 10/25/2015 -----------------------------------------  Hungry, tolerating oral intake. Vitals remained  stable.    ____________________________________________   FINAL CLINICAL IMPRESSION(S) / ED DIAGNOSES  Final diagnoses:  Mild dehydration  Essential tremor      Carrie Mew, MD 10/25/15 1035

## 2015-10-25 NOTE — ED Notes (Signed)
Pt given a urinal.

## 2015-10-25 NOTE — ED Notes (Signed)
Pt given graham crackers and sprite 

## 2015-10-25 NOTE — ED Notes (Signed)
Pt reports being out depakote x 2 days

## 2015-10-27 ENCOUNTER — Inpatient Hospital Stay
Admission: EM | Admit: 2015-10-27 | Discharge: 2015-11-18 | DRG: 643 | Disposition: A | Discharge status: Home Health | Payer: Self-pay | Attending: Internal Medicine | Admitting: Internal Medicine

## 2015-10-27 ENCOUNTER — Inpatient Hospital Stay: Payer: MEDICAID

## 2015-10-27 ENCOUNTER — Emergency Department: Payer: Self-pay

## 2015-10-27 ENCOUNTER — Other Ambulatory Visit: Payer: Self-pay

## 2015-10-27 ENCOUNTER — Encounter: Payer: Self-pay | Admitting: Urgent Care

## 2015-10-27 DIAGNOSIS — D649 Anemia, unspecified: Secondary | ICD-10-CM | POA: Insufficient documentation

## 2015-10-27 DIAGNOSIS — R109 Unspecified abdominal pain: Secondary | ICD-10-CM | POA: Diagnosis present

## 2015-10-27 DIAGNOSIS — I959 Hypotension, unspecified: Secondary | ICD-10-CM | POA: Diagnosis not present

## 2015-10-27 DIAGNOSIS — F329 Major depressive disorder, single episode, unspecified: Secondary | ICD-10-CM | POA: Diagnosis present

## 2015-10-27 DIAGNOSIS — E43 Unspecified severe protein-calorie malnutrition: Secondary | ICD-10-CM | POA: Diagnosis present

## 2015-10-27 DIAGNOSIS — R479 Unspecified speech disturbances: Secondary | ICD-10-CM | POA: Diagnosis present

## 2015-10-27 DIAGNOSIS — R4182 Altered mental status, unspecified: Secondary | ICD-10-CM | POA: Insufficient documentation

## 2015-10-27 DIAGNOSIS — Z79899 Other long term (current) drug therapy: Secondary | ICD-10-CM

## 2015-10-27 DIAGNOSIS — N182 Chronic kidney disease, stage 2 (mild): Secondary | ICD-10-CM | POA: Diagnosis present

## 2015-10-27 DIAGNOSIS — R52 Pain, unspecified: Secondary | ICD-10-CM

## 2015-10-27 DIAGNOSIS — I129 Hypertensive chronic kidney disease with stage 1 through stage 4 chronic kidney disease, or unspecified chronic kidney disease: Secondary | ICD-10-CM | POA: Diagnosis present

## 2015-10-27 DIAGNOSIS — E538 Deficiency of other specified B group vitamins: Secondary | ICD-10-CM | POA: Diagnosis present

## 2015-10-27 DIAGNOSIS — E222 Syndrome of inappropriate secretion of antidiuretic hormone: Principal | ICD-10-CM | POA: Diagnosis present

## 2015-10-27 DIAGNOSIS — G9389 Other specified disorders of brain: Secondary | ICD-10-CM | POA: Diagnosis not present

## 2015-10-27 DIAGNOSIS — G40909 Epilepsy, unspecified, not intractable, without status epilepticus: Secondary | ICD-10-CM | POA: Diagnosis present

## 2015-10-27 DIAGNOSIS — Z596 Low income: Secondary | ICD-10-CM

## 2015-10-27 DIAGNOSIS — S0101XA Laceration without foreign body of scalp, initial encounter: Secondary | ICD-10-CM | POA: Insufficient documentation

## 2015-10-27 DIAGNOSIS — Y92230 Patient room in hospital as the place of occurrence of the external cause: Secondary | ICD-10-CM | POA: Diagnosis not present

## 2015-10-27 DIAGNOSIS — M79642 Pain in left hand: Secondary | ICD-10-CM | POA: Diagnosis not present

## 2015-10-27 DIAGNOSIS — R410 Disorientation, unspecified: Secondary | ICD-10-CM

## 2015-10-27 DIAGNOSIS — G8929 Other chronic pain: Secondary | ICD-10-CM | POA: Diagnosis present

## 2015-10-27 DIAGNOSIS — G9341 Metabolic encephalopathy: Secondary | ICD-10-CM | POA: Diagnosis present

## 2015-10-27 DIAGNOSIS — R55 Syncope and collapse: Secondary | ICD-10-CM | POA: Diagnosis present

## 2015-10-27 DIAGNOSIS — Z6822 Body mass index (BMI) 22.0-22.9, adult: Secondary | ICD-10-CM

## 2015-10-27 DIAGNOSIS — R627 Adult failure to thrive: Secondary | ICD-10-CM | POA: Insufficient documentation

## 2015-10-27 DIAGNOSIS — G43909 Migraine, unspecified, not intractable, without status migrainosus: Secondary | ICD-10-CM | POA: Diagnosis present

## 2015-10-27 DIAGNOSIS — R296 Repeated falls: Secondary | ICD-10-CM | POA: Diagnosis present

## 2015-10-27 DIAGNOSIS — R5383 Other fatigue: Secondary | ICD-10-CM

## 2015-10-27 DIAGNOSIS — G250 Essential tremor: Secondary | ICD-10-CM | POA: Diagnosis present

## 2015-10-27 DIAGNOSIS — R6881 Early satiety: Secondary | ICD-10-CM | POA: Insufficient documentation

## 2015-10-27 DIAGNOSIS — D631 Anemia in chronic kidney disease: Secondary | ICD-10-CM | POA: Diagnosis present

## 2015-10-27 DIAGNOSIS — W19XXXA Unspecified fall, initial encounter: Secondary | ICD-10-CM | POA: Insufficient documentation

## 2015-10-27 DIAGNOSIS — R42 Dizziness and giddiness: Secondary | ICD-10-CM | POA: Insufficient documentation

## 2015-10-27 LAB — URINALYSIS COMPLETE WITH MICROSCOPIC (ARMC ONLY)
BACTERIA UA: NONE SEEN
Bilirubin Urine: NEGATIVE
Glucose, UA: NEGATIVE mg/dL
Hgb urine dipstick: NEGATIVE
Leukocytes, UA: NEGATIVE
NITRITE: NEGATIVE
PH: 6 (ref 5.0–8.0)
PROTEIN: NEGATIVE mg/dL
Specific Gravity, Urine: 1.014 (ref 1.005–1.030)

## 2015-10-27 LAB — BASIC METABOLIC PANEL
Anion gap: 8 (ref 5–15)
BUN: 23 mg/dL — AB (ref 6–20)
CO2: 27 mmol/L (ref 22–32)
CREATININE: 1.19 mg/dL (ref 0.61–1.24)
Calcium: 9 mg/dL (ref 8.9–10.3)
Chloride: 95 mmol/L — ABNORMAL LOW (ref 101–111)
GFR calc Af Amer: >60 mL/min (ref >60)
GLUCOSE: 92 mg/dL (ref 65–99)
Potassium: 3.5 mmol/L (ref 3.5–5.1)
SODIUM: 130 mmol/L — AB (ref 135–145)

## 2015-10-27 LAB — CBC
HEMATOCRIT: 21.7 % — AB (ref 40.0–52.0)
Hemoglobin: 7.3 g/dL — ABNORMAL LOW (ref 13.0–18.0)
MCH: 33.5 pg (ref 26.0–34.0)
MCHC: 33.6 g/dL (ref 32.0–36.0)
MCV: 99.7 fL (ref 80.0–100.0)
PLATELETS: 158 10*3/uL (ref 150–440)
RBC: 2.18 MIL/uL — ABNORMAL LOW (ref 4.40–5.90)
RDW: 17.2 % — AB (ref 11.5–14.5)
WBC: 1.8 10*3/uL — AB (ref 3.8–10.6)

## 2015-10-27 LAB — ABO/RH: ABO/RH(D): O POS

## 2015-10-27 LAB — TROPONIN I: Troponin I: <0.03 ng/mL (ref <0.031)

## 2015-10-27 LAB — VALPROIC ACID LEVEL: VALPROIC ACID LVL: 115 ug/mL — AB (ref 50.0–100.0)

## 2015-10-27 LAB — PREPARE RBC (CROSSMATCH)

## 2015-10-27 LAB — CARBAMAZEPINE LEVEL, TOTAL: Carbamazepine Lvl: 7.2 ug/mL (ref 4.0–12.0)

## 2015-10-27 LAB — HEMOGLOBIN: HEMOGLOBIN: 8.7 g/dL — AB (ref 13.0–18.0)

## 2015-10-27 MED ORDER — ACETAMINOPHEN 650 MG RE SUPP: 650.0000 mg | Freq: Four times a day (QID) | RECTAL | Status: DC | PRN

## 2015-10-27 MED ORDER — DIVALPROEX SODIUM 500 MG PO DR TAB
500.0000 mg | DELAYED_RELEASE_TABLET | Freq: Every day | ORAL | Status: DC
  Administered 2015-10-27 – 2015-10-31 (×5): 500 mg via ORAL
  Filled 2015-10-27 (×5): qty 1

## 2015-10-27 MED ORDER — PROPRANOLOL HCL 40 MG PO TABS
40.0000 mg | ORAL_TABLET | Freq: Two times a day (BID) | ORAL | Status: DC
  Administered 2015-10-27 – 2015-11-01 (×7): 40 mg via ORAL
  Filled 2015-10-27 (×10): qty 1

## 2015-10-27 MED ORDER — ACETAMINOPHEN 325 MG PO TABS
650.0000 mg | ORAL_TABLET | Freq: Four times a day (QID) | ORAL | Status: DC | PRN
  Administered 2015-10-31: 08:00:00 650 mg via ORAL
  Filled 2015-10-27: qty 2

## 2015-10-27 MED ORDER — DOCUSATE SODIUM 100 MG PO CAPS
100.0000 mg | ORAL_CAPSULE | Freq: Two times a day (BID) | ORAL | Status: DC
  Administered 2015-10-27 – 2015-11-18 (×41): 100 mg via ORAL
  Filled 2015-10-27 (×42): qty 1

## 2015-10-27 MED ORDER — CARBAMAZEPINE 200 MG PO TABS
400.0000 mg | ORAL_TABLET | Freq: Every day | ORAL | Status: DC
  Administered 2015-10-28 – 2015-11-03 (×7): 400 mg via ORAL
  Filled 2015-10-27 (×10): qty 2

## 2015-10-27 MED ORDER — CARBAMAZEPINE 200 MG PO TABS: 200.0000 mg | ORAL_TABLET | Freq: Three times a day (TID) | ORAL | Status: DC

## 2015-10-27 MED ORDER — SODIUM CHLORIDE 0.9 % IV SOLN
10.0000 mL/h | Freq: Once | INTRAVENOUS | Status: AC
  Administered 2015-10-27: 10 mL/h via INTRAVENOUS

## 2015-10-27 MED ORDER — CARBAMAZEPINE 200 MG PO TABS
400.0000 mg | ORAL_TABLET | Freq: Every day | ORAL | Status: DC
  Administered 2015-10-27 – 2015-11-02 (×7): 400 mg via ORAL
  Filled 2015-10-27 (×2): qty 2
  Filled 2015-10-27: qty 1
  Filled 2015-10-27: qty 2

## 2015-10-27 MED ORDER — LISINOPRIL 20 MG PO TABS
20.0000 mg | ORAL_TABLET | Freq: Every day | ORAL | Status: DC
  Administered 2015-10-27 – 2015-11-01 (×6): 20 mg via ORAL
  Filled 2015-10-27 (×6): qty 1

## 2015-10-27 MED ORDER — ONDANSETRON HCL 4 MG/2ML IJ SOLN: 4.0000 mg | Freq: Four times a day (QID) | INTRAMUSCULAR | Status: DC | PRN

## 2015-10-27 MED ORDER — SODIUM CHLORIDE 0.9 % IJ SOLN
3.0000 mL | Freq: Two times a day (BID) | INTRAMUSCULAR | Status: DC
  Administered 2015-10-27 – 2015-11-12 (×28): 3 mL via INTRAVENOUS
  Administered 2015-11-13: 08:00:00 10 mL via INTRAVENOUS
  Administered 2015-11-13 – 2015-11-15 (×4): 3 mL via INTRAVENOUS
  Administered 2015-11-15: 22:00:00 10 mL via INTRAVENOUS
  Administered 2015-11-16 – 2015-11-18 (×5): 3 mL via INTRAVENOUS

## 2015-10-27 MED ORDER — IOHEXOL 300 MG/ML  SOLN
100.0000 mL | Freq: Once | INTRAMUSCULAR | Status: AC | PRN
  Administered 2015-10-27: 100 mL via INTRAVENOUS

## 2015-10-27 MED ORDER — ONDANSETRON HCL 4 MG PO TABS: 4.0000 mg | ORAL_TABLET | Freq: Four times a day (QID) | ORAL | Status: DC | PRN

## 2015-10-27 MED ORDER — DIVALPROEX SODIUM 250 MG PO DR TAB: 500.0000 mg | DELAYED_RELEASE_TABLET | Freq: Three times a day (TID) | ORAL | Status: DC

## 2015-10-27 MED ORDER — CARBAMAZEPINE 200 MG PO TABS
200.0000 mg | ORAL_TABLET | Freq: Every day | ORAL | Status: DC
  Administered 2015-10-27 – 2015-11-02 (×7): 200 mg via ORAL
  Filled 2015-10-27 (×8): qty 1

## 2015-10-27 MED ORDER — PANTOPRAZOLE SODIUM 40 MG PO TBEC
40.0000 mg | DELAYED_RELEASE_TABLET | Freq: Every day | ORAL | Status: DC
  Administered 2015-10-27 – 2015-11-09 (×14): 40 mg via ORAL
  Filled 2015-10-27 (×14): qty 1

## 2015-10-27 MED ORDER — BUTALBITAL-APAP-CAFFEINE 50-325-40 MG PO TABS: 1.0000 | ORAL_TABLET | ORAL | Status: DC | PRN

## 2015-10-27 MED ORDER — AMLODIPINE BESYLATE 5 MG PO TABS
5.0000 mg | ORAL_TABLET | Freq: Every day | ORAL | Status: DC
  Administered 2015-10-27 – 2015-11-01 (×6): 5 mg via ORAL
  Filled 2015-10-27 (×6): qty 1

## 2015-10-27 MED ORDER — SODIUM CHLORIDE 0.9 % IV BOLUS (SEPSIS)
500.0000 mL | Freq: Once | INTRAVENOUS | Status: AC
  Administered 2015-10-27: 500 mL via INTRAVENOUS

## 2015-10-27 MED ORDER — ENSURE ENLIVE PO LIQD
1.0000 | Freq: Two times a day (BID) | ORAL | Status: DC
  Administered 2015-10-27 – 2015-11-07 (×20): 237 mL via ORAL

## 2015-10-27 MED ORDER — DIVALPROEX SODIUM 500 MG PO DR TAB
1000.0000 mg | DELAYED_RELEASE_TABLET | Freq: Every day | ORAL | Status: DC
  Administered 2015-10-27 – 2015-11-01 (×6): 1000 mg via ORAL
  Filled 2015-10-27 (×7): qty 2

## 2015-10-27 MED ORDER — IOHEXOL 240 MG/ML SOLN
25.0000 mL | INTRAMUSCULAR | Status: AC
  Administered 2015-10-27: 50 mL via ORAL

## 2015-10-27 MED ORDER — DIVALPROEX SODIUM 500 MG PO DR TAB
1000.0000 mg | DELAYED_RELEASE_TABLET | Freq: Every day | ORAL | Status: DC
  Administered 2015-10-28 – 2015-11-03 (×7): 1000 mg via ORAL
  Filled 2015-10-27 (×8): qty 2

## 2015-10-27 NOTE — ED Notes (Signed)
Patient out of room for testing

## 2015-10-27 NOTE — Progress Notes (Addendum)
Patient had no c/o pain this shift VSS stable  Patient got one unit of blood this shift started in the ED, hemo up to  8.7  GI saw patient today and patient refused any further procedures to determine possible cause of blood loss  Possible discharge tomorrow if hemo stable

## 2015-10-27 NOTE — H&P (Signed)
[place] [facility] [place] NAME: [name]    MR#:  [id number]  DATE OF BIRTH:  1961-11-13  DATE OF ADMISSION:  10/27/2015  PRIMARY CARE PHYSICIAN: [name], MD   REQUESTING/REFERRING PHYSICIAN: Edd Fabian  CHIEF COMPLAINT:  Weakness and dizziness  HISTORY OF PRESENT ILLNESS:  [name]  is a 54 y.o. male with a known history of essential hypertension, seizures and chronic kidney disease is presenting to the ED with a chief complaint of chronic abdominal pain and generalized weakness associated with lightheadedness for the past 5 days. Patient is feeling progressively weak which made him come to the hospital ED. Patient denies any loss of consciousness. Denies any chest pain or shortness of breath. But admits 30 pounds weight loss in 3 months, which is unintentional. Denies any nausea vomiting or melena. Patient never had any EGD or colonoscopy done in the past. Stool for occult blood was negative in the ED. Patient's hemoglobin is at 7.3, 80 physician has ordered 1 unit of blood transfusion. No family members at bedside during my examination   PAST MEDICAL HISTORY:   Past Medical History  Diagnosis Date  . Hypertension   . Seizures ([place])   . Kidney disease   . Anemia   . Migraine   . Hyponatremia     chronic    PAST SURGICAL HISTOIRY:   Past Surgical History  Procedure Laterality Date  . None      SOCIAL HISTORY:   Social History  Substance Use Topics  . Smoking status: Never Smoker   . Smokeless tobacco: Not on file  . Alcohol Use: No    FAMILY HISTORY:   Family History  Problem Relation Age of Onset  . Heart attack Father   . Kidney disease Father     DRUG ALLERGIES:  No Known Allergies  REVIEW OF SYSTEMS:  CONSTITUTIONAL: No fever,  reporting fatigue and progressively worsening  weakness. Reporting unintentional weight loss of 30 pounds in 3 months  EYES: No blurred or double vision.  EARS, NOSE, AND  THROAT: No tinnitus or ear pain.  RESPIRATORY: No cough, shortness of breath, wheezing or hemoptysis.  CARDIOVASCULAR: No chest pain, orthopnea, edema.  GASTROINTESTINAL: No nausea, vomiting, diarrhea .reports chronic  abdominal pain.  GENITOURINARY: No dysuria, hematuria.  ENDOCRINE: No polyuria, nocturia,  HEMATOLOGY: No anemia, easy bruising or bleeding SKIN: No rash or lesion. MUSCULOSKELETAL: No joint pain or arthritis.   NEUROLOGIC: No tingling, numbness, weakness.  PSYCHIATRY: No anxiety or depression.   MEDICATIONS AT HOME:   Prior to Admission medications   Medication Sig Start Date End Date Taking? Authorizing Provider  amLODipine (NORVASC) 5 MG tablet Take 5 mg by mouth daily.   Yes Historical Provider, MD  butalbital-acetaminophen-caffeine (FIORICET, ESGIC) 50-325-40 MG per tablet Take 1-2 tablets by mouth every 4 (four) hours as needed for migraine.   Yes Historical Provider, MD  carbamazepine (TEGRETOL) 200 MG tablet Take 200-400 mg by mouth 3 (three) times daily. Pt takes two in the morning, one at noon, and two at bedtime.   Yes Historical Provider, MD  divalproex (DEPAKOTE) 500 MG DR tablet Take 1-2 tablets (500-1,000 mg total) by mouth 3 (three) times daily. Pt takes two in the morning, one at noon, and two at bedtime. 10/25/15  Yes Carrie Mew, MD  feeding supplement, ENSURE ENLIVE, (ENSURE ENLIVE) LIQD Take 237 mLs by mouth 2 (two) times daily between meals. 10/21/15  Yes Vaughan Basta, MD  lisinopril (  PRINIVIL,ZESTRIL) 20 MG tablet Take 20 mg by mouth daily.   Yes Historical Provider, MD  pantoprazole (PROTONIX) 40 MG tablet Take 1 tablet (40 mg total) by mouth daily. 09/07/15  Yes Fritzi Mandes, MD  propranolol (INDERAL) 20 MG tablet Take 40 mg by mouth 2 (two) times daily.   Yes Historical Provider, MD      VITAL SIGNS:  Blood pressure 160/78, pulse 53, temperature 98 F (36.7 C), temperature source Oral, resp. rate 20, height 5\' 3"  (1.6 m), weight 52.164 kg  (115 lb), SpO2 99 %.  PHYSICAL EXAMINATION:  GENERAL:  54 y.o.-year-old patient lying in the bed with no acute distress.  EYES: Pupils equal, round, reactive to light and accommodation. No scleral icterus. Extraocular muscles intact.  HEENT: Head atraumatic, normocephalic. Oropharynx and nasopharynx clear.  NECK:  Supple, no jugular venous distention. No thyroid enlargement, no tenderness.  LUNGS: Normal breath sounds bilaterally, no wheezing, rales,rhonchi or crepitation. No use of accessory muscles of respiration.  CARDIOVASCULAR: S1, S2 normal. No murmurs, rubs, or gallops.  ABDOMEN: Soft, nontender, nondistended. Bowel sounds present. No organomegaly or mass.  EXTREMITIES: No pedal edema, cyanosis, or clubbing.  NEUROLOGIC: Cranial nerves II through XII are intact. Muscle strength 5/5 in all extremities. Sensation intact. Gait not checked.  PSYCHIATRIC: The patient is alert and oriented x 3.  SKIN: No obvious rash, lesion, or ulcer.   LABORATORY PANEL:   CBC  Recent Labs Lab 10/27/15 0703  WBC 1.8*  HGB 7.3*  HCT 21.7*  PLT 158   ------------------------------------------------------------------------------------------------------------------  Chemistries   Recent Labs Lab 10/23/15 1719  10/27/15 0703  NA 128*  < > 130*  K 4.1  < > 3.5  CL 96*  < > 95*  CO2 26  < > 27  GLUCOSE 108*  < > 92  BUN 20  < > 23*  CREATININE 1.11  < > 1.19  CALCIUM 9.4  < > 9.0  AST 39  --   --   ALT 35  --   --   ALKPHOS 75  --   --   BILITOT 0.8  --   --   < > = values in this interval not displayed. ------------------------------------------------------------------------------------------------------------------  Cardiac Enzymes  Recent Labs Lab 10/27/15 0703  TROPONINI <0.03   ------------------------------------------------------------------------------------------------------------------  RADIOLOGY:  Dg Chest 2 View  10/27/2015  CLINICAL DATA:  Generalized weakness for  1 week EXAM: CHEST - 2 VIEW COMPARISON:  10/23/2015 FINDINGS: Cardiac shadow is stable. The lungs are well aerated bilaterally. No focal infiltrate or sizable effusion is seen. Degenerative changes of the thoracic spine are noted. IMPRESSION: No acute abnormality seen. Electronically Signed   By: Inez Catalina M.D.   On: 10/27/2015 07:55   Ct Head Wo Contrast  10/27/2015  CLINICAL DATA:  54 year old hypertensive male with history of migraine headaches and seizures presenting with generalized weakness for the past week. Subsequent encounter. EXAM: CT HEAD WITHOUT CONTRAST TECHNIQUE: Contiguous axial images were obtained from the base of the skull through the vertex without intravenous contrast. COMPARISON:  10/09/2015 and 07/18/2010 head CT. FINDINGS: No skull fracture or intracranial hemorrhage. 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 Ventricular prominence may reflect result of central atrophy rather than hydrocephalus although atrophy has progressed since 2011. No CT evidence of large acute infarct. Vascular calcifications. No intracranial mass lesion noted on this unenhanced exam. Mastoid air cells, middle ear cavities and visualized paranasal sinuses are clear. Orbital structures appear intact. IMPRESSION: No skull fracture  or intracranial hemorrhage. 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 Ventricular prominence may reflect result of central atrophy rather than hydrocephalus although atrophy has progressed since 2011. No CT evidence of large acute infarct. No intracranial mass lesion noted on this unenhanced exam. Electronically Signed   By: Genia Del M.D.   On: 10/27/2015 08:20   Ct Chest W Contrast  10/27/2015  CLINICAL DATA:  Failure to thrive in adult. Chronic abdominal pain. Weight loss. EXAM: CT CHEST, ABDOMEN, AND PELVIS WITH CONTRAST TECHNIQUE: Multidetector CT imaging of the chest, abdomen and pelvis was performed following the standard protocol during  bolus administration of intravenous contrast. CONTRAST:  100 cc Omnipaque 300 IV. COMPARISON:  Chest radiograph from earlier today. 10/19/2015 CT abdomen/pelvis. FINDINGS: CT CHEST FINDINGS Mediastinum/Nodes: Normal heart size. No pericardial fluid/thickening. Great vessels are normal in course and caliber. No central pulmonary emboli. Normal visualized thyroid. Normal esophagus. No pathologically enlarged axillary, mediastinal or hilar lymph nodes. Lungs/Pleura: No pneumothorax. No right pleural effusion. Trace layering left pleural effusion. No acute consolidative airspace disease, significant pulmonary nodules or lung masses. Minimal reticulation and ground-glass opacity in the dependent lower lobes likely represents hypoventilatory change. Musculoskeletal: No aggressive appearing focal osseous lesions. Nondisplaced posterior right eighth and ninth rib fractures and mildly displaced posterior right tenth rib fracture, which appear subacute and incompletely healed. There are subacute healing minimally displaced eighth, ninth and tenth posterolateral left rib fractures. Mild degenerative changes in the thoracic spine. CT ABDOMEN PELVIS FINDINGS Hepatobiliary: Normal liver with no liver mass. Normal gallbladder with no radiopaque cholelithiasis. No biliary ductal dilatation. Pancreas: Normal, with no mass or duct dilation. Spleen: Normal size. No mass. Adrenals/Urinary Tract: Normal adrenals. Normal kidneys with no hydronephrosis and no renal mass. Relatively collapsed and grossly normal bladder, with no definite bladder wall thickening. Stomach/Bowel: Grossly normal stomach. Normal caliber small bowel with no small bowel wall thickening. Normal appendix. Normal large bowel with no diverticulosis, large bowel wall thickening or pericolonic fat stranding. Vascular/Lymphatic: Normal caliber abdominal aorta. Patent portal, splenic, hepatic and renal veins. No pathologically enlarged lymph nodes in the abdomen or  pelvis. Reproductive: Top-normal size prostate.  Normal seminal vesicles. Other: No pneumoperitoneum, ascites or focal fluid collection. Musculoskeletal: No aggressive appearing focal osseous lesions. Mild degenerative changes in the lumbar spine. IMPRESSION: 1. Multiple subacute incompletely healed lower rib fractures bilaterally. Trace layering left pleural effusion. No pneumothorax. 2. Otherwise no acute abnormality in the chest, abdomen or pelvis. No evidence of bowel obstruction or acute bowel inflammation. Normal appendix. 3. No CT evidence of a primary neoplasm or metastatic disease in the chest, abdomen or pelvis. Electronically Signed   By: Ilona Sorrel M.D.   On: 10/27/2015 14:43   Ct Abdomen Pelvis W Contrast  10/27/2015  CLINICAL DATA:  Failure to thrive in adult. Chronic abdominal pain. Weight loss. EXAM: CT CHEST, ABDOMEN, AND PELVIS WITH CONTRAST TECHNIQUE: Multidetector CT imaging of the chest, abdomen and pelvis was performed following the standard protocol during bolus administration of intravenous contrast. CONTRAST:  100 cc Omnipaque 300 IV. COMPARISON:  Chest radiograph from earlier today. 10/19/2015 CT abdomen/pelvis. FINDINGS: CT CHEST FINDINGS Mediastinum/Nodes: Normal heart size. No pericardial fluid/thickening. Great vessels are normal in course and caliber. No central pulmonary emboli. Normal visualized thyroid. Normal esophagus. No pathologically enlarged axillary, mediastinal or hilar lymph nodes. Lungs/Pleura: No pneumothorax. No right pleural effusion. Trace layering left pleural effusion. No acute consolidative airspace disease, significant pulmonary nodules or lung masses. Minimal reticulation and ground-glass opacity in  the dependent lower lobes likely represents hypoventilatory change. Musculoskeletal: No aggressive appearing focal osseous lesions. Nondisplaced posterior right eighth and ninth rib fractures and mildly displaced posterior right tenth rib fracture, which appear  subacute and incompletely healed. There are subacute healing minimally displaced eighth, ninth and tenth posterolateral left rib fractures. Mild degenerative changes in the thoracic spine. CT ABDOMEN PELVIS FINDINGS Hepatobiliary: Normal liver with no liver mass. Normal gallbladder with no radiopaque cholelithiasis. No biliary ductal dilatation. Pancreas: Normal, with no mass or duct dilation. Spleen: Normal size. No mass. Adrenals/Urinary Tract: Normal adrenals. Normal kidneys with no hydronephrosis and no renal mass. Relatively collapsed and grossly normal bladder, with no definite bladder wall thickening. Stomach/Bowel: Grossly normal stomach. Normal caliber small bowel with no small bowel wall thickening. Normal appendix. Normal large bowel with no diverticulosis, large bowel wall thickening or pericolonic fat stranding. Vascular/Lymphatic: Normal caliber abdominal aorta. Patent portal, splenic, hepatic and renal veins. No pathologically enlarged lymph nodes in the abdomen or pelvis. Reproductive: Top-normal size prostate.  Normal seminal vesicles. Other: No pneumoperitoneum, ascites or focal fluid collection. Musculoskeletal: No aggressive appearing focal osseous lesions. Mild degenerative changes in the lumbar spine. IMPRESSION: 1. Multiple subacute incompletely healed lower rib fractures bilaterally. Trace layering left pleural effusion. No pneumothorax. 2. Otherwise no acute abnormality in the chest, abdomen or pelvis. No evidence of bowel obstruction or acute bowel inflammation. Normal appendix. 3. No CT evidence of a primary neoplasm or metastatic disease in the chest, abdomen or pelvis. Electronically Signed   By: Ilona Sorrel M.D.   On: 10/27/2015 14:43    EKG:   Orders placed or performed during the hospital encounter of 10/27/15  . ED EKG within 10 minutes  . ED EKG within 10 minutes  . EKG 12-Lead  . EKG 12-Lead  . EKG 12-Lead  . EKG 12-Lead  . ED EKG  . ED EKG  . EKG 12-Lead  . EKG  12-Lead  . EKG 12-Lead  . EKG 12-Lead    IMPRESSION AND PLAN:  Patient is presenting with a chief complaint of chronic abdominal pain and generalized weakness associated with lightheadedness for the past 5 days. Patient is feeling progressively weak which made him come to the hospital ED. Patient denies any loss of consciousness. Denies any chest pain or shortness of breath. But admits 30 pounds weight loss in 3 months, which is unintentional. Denies any nausea vomiting or melena.  1. Near syncope secondary to Symptomatic anemia with chronic abdominal pain Transfuse 1 unit of blood and repeat hemoglobin and hematocrit GI consult is placed for possible EGD and colonoscopy Stool for occult blood is negative in the ED. We'll check 1 more sample of the stool for occult blood Will provide GI prophylaxis with Protonix Will order CT abdomen and pelvis for chronic abdominal pain  2. Failure to thrive-unintentional weight loss of 30 pounds in 3 months CT head is negative for any metastases Will get CT chest, abdomen and pelvis to rule out any kind of malignancy  \Consult gastroenterology ,patient will be benefited with EGD and colonoscopy  3. Essential tremors-continue home medication propranolol  4. Hypertension-continue amlodipine and ACE inhibitor   5. History of seizures Resume his home medication Tegretol and Depakote   GI prophylaxis with Protonix DVT prophylaxis with SCDs  All the records are reviewed and case discussed with ED provider. Management plans discussed with the patient, family and they are in agreement.  CODE STATUS: Full code, [name] is the healthcare power of  attorney  TOTAL TIME TAKING CARE OF THIS PATIENT: 45 minutes.    Nicholes Mango M.D on 10/27/2015 at 2:57 PM  Between 7am to 6pm - Pager - [phone]  After 6pm go to [email or web address] - password [email or web address] [facility]  Office  [phone]  CC: Primary care physician; [name], MD

## 2015-10-27 NOTE — Consult Note (Signed)
[facility]  [address], [place], [place] [address] Phone: [phone] Fax : [phone]  Consultation  Referring Provider:     No ref. provider found Primary Care Physician:  [name], MD Primary Gastroenterologist:  None         Reason for Consultation:     Anemia  Date of Admission:  10/27/2015 Date of Consultation:  10/27/2015         HPI:   [name] is a 55 y.o. male who was admitted with symptomatic anemia. The patient reports that his been feeling weak for the last month. He also reports that he has been having some weight loss over the last month. The patient states he has some left upper quadrant pain that has been present for 2 weeks. The patient has just come back from a CT scan of the abdomen for his symptoms. The patient was noted to have a significant anemia with a hemoglobin of 7.3. The patient denies any nausea or vomiting but states that he has early satiety. The patient is very preoccupied with his medications that he hasn't taken today because of the CT scan and is very hard to keep focused on the questions that hand about his anemia and GI symptoms. He denies any black stools or bloody stools. He also denies any family history of colon cancer colon polyps.  Past Medical History  Diagnosis Date  . Hypertension   . Seizures ([place])   . Kidney disease   . Anemia   . Migraine   . Hyponatremia     chronic    Past Surgical History  Procedure Laterality Date  . None      Prior to Admission medications   Medication Sig Start Date End Date Taking? Authorizing Provider  amLODipine (NORVASC) 5 MG tablet Take 5 mg by mouth daily.   Yes Historical Provider, MD  butalbital-acetaminophen-caffeine (FIORICET, ESGIC) 50-325-40 MG per tablet Take 1-2 tablets by mouth every 4 (four) hours as needed for migraine.   Yes Historical Provider, MD  carbamazepine (TEGRETOL) 200 MG tablet Take 200-400 mg by mouth 3 (three) times daily. Pt takes two in  the morning, one at noon, and two at bedtime.   Yes Historical Provider, MD  divalproex (DEPAKOTE) 500 MG DR tablet Take 1-2 tablets (500-1,000 mg total) by mouth 3 (three) times daily. Pt takes two in the morning, one at noon, and two at bedtime. 10/25/15  Yes Carrie Mew, MD  feeding supplement, ENSURE ENLIVE, (ENSURE ENLIVE) LIQD Take 237 mLs by mouth 2 (two) times daily between meals. 10/21/15  Yes Vaughan Basta, MD  lisinopril (PRINIVIL,ZESTRIL) 20 MG tablet Take 20 mg by mouth daily.   Yes Historical Provider, MD  pantoprazole (PROTONIX) 40 MG tablet Take 1 tablet (40 mg total) by mouth daily. 09/07/15  Yes Fritzi Mandes, MD  propranolol (INDERAL) 20 MG tablet Take 40 mg by mouth 2 (two) times daily.   Yes Historical Provider, MD    Family History  Problem Relation Age of Onset  . Heart attack Father   . Kidney disease Father      Social History  Substance Use Topics  . Smoking status: Never Smoker   . Smokeless tobacco: None  . Alcohol Use: No    Allergies as of 10/27/2015  . (No Known Allergies)    Review of Systems:    All systems reviewed and negative except where noted in HPI.   Physical Exam:  Vital signs in last 24 hours:  Temp:  [97.9 F (36.6 C)-98.1 F (36.7 C)] 98 F (36.7 C) (11/10 1310) Pulse Rate:  [57-75] 69 (11/10 1310) Resp:  [13-20] 20 (11/10 1147) BP: (135-167)/(85-94) 167/90 mmHg (11/10 1310) SpO2:  [99 %-100 %] 99 % (11/10 1310) Weight:  [108 lb 9.6 oz (49.261 kg)-115 lb (52.164 kg)] 115 lb (52.164 kg) (11/10 1147)   General:   Pleasant, cooperative in NAD. Patient is very fidgety. Head:  Normocephalic and atraumatic. Eyes:   No icterus.   Conjunctiva pink. PERRLA. Ears:  Normal auditory acuity. Neck:  Supple; no masses or thyroidomegaly Lungs: Respirations even and unlabored. Lungs clear to auscultation bilaterally.   No wheezes, crackles, or rhonchi.  Heart:  Regular rate and rhythm;  Without murmur, clicks, rubs or gallops Abdomen:   Soft, nondistended, nontender. Normal bowel sounds. No appreciable masses or hepatomegaly.  No rebound or guarding.  Rectal:  Not performed. Msk:  Symmetrical without gross deformities.   Extremities:  Without edema, cyanosis or clubbing. Neurologic:  Alert and oriented x3;  patient has a resting tremor Skin:  Intact without significant lesions or rashes. Cervical Nodes:  No significant cervical adenopathy. Psych:  Alert and cooperative. Normal affect.  LAB RESULTS:  Recent Labs  10/25/15 0738 10/27/15 0703  WBC 2.3* 1.8*  HGB 7.4* 7.3*  HCT 21.9* 21.7*  PLT 168 158   BMET  Recent Labs  10/25/15 0738 10/27/15 0703  NA 128* 130*  K 3.7 3.5  CL 93* 95*  CO2 28 27  GLUCOSE 81 92  BUN 19 23*  CREATININE 1.22 1.19  CALCIUM 9.3 9.0   LFT No results for input(s): PROT, ALBUMIN, AST, ALT, ALKPHOS, BILITOT, BILIDIR, IBILI in the last 72 hours. PT/INR No results for input(s): LABPROT, INR in the last 72 hours.  STUDIES: Dg Chest 2 View  10/27/2015  CLINICAL DATA:  Generalized weakness for 1 week EXAM: CHEST - 2 VIEW COMPARISON:  10/23/2015 FINDINGS: Cardiac shadow is stable. The lungs are well aerated bilaterally. No focal infiltrate or sizable effusion is seen. Degenerative changes of the thoracic spine are noted. IMPRESSION: No acute abnormality seen. Electronically Signed   By: Inez Catalina M.D.   On: 10/27/2015 07:55   Ct Head Wo Contrast  10/27/2015  CLINICAL DATA:  54 year old hypertensive male with history of migraine headaches and seizures presenting with generalized weakness for the past week. Subsequent encounter. EXAM: CT HEAD WITHOUT CONTRAST TECHNIQUE: Contiguous axial images were obtained from the base of the skull through the vertex without intravenous contrast. COMPARISON:  10/09/2015 and 07/18/2010 head CT. FINDINGS: No skull fracture or intracranial hemorrhage. 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 Ventricular prominence may reflect result of  central atrophy rather than hydrocephalus although atrophy has progressed since 2011. No CT evidence of large acute infarct. Vascular calcifications. No intracranial mass lesion noted on this unenhanced exam. Mastoid air cells, middle ear cavities and visualized paranasal sinuses are clear. Orbital structures appear intact. IMPRESSION: No skull fracture or intracranial hemorrhage. 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 Ventricular prominence may reflect result of central atrophy rather than hydrocephalus although atrophy has progressed since 2011. No CT evidence of large acute infarct. No intracranial mass lesion noted on this unenhanced exam. Electronically Signed   By: Genia Del M.D.   On: 10/27/2015 08:20      Impression / Plan:   [name] is a 54 y.o. y/o male with an admission for a symptomatically anemia. The patient has never had an EGD  and colonoscopy for evaluation of his GI tract in the past. The patient had a CT scan of the head that showed no fractures but Global atrophy of the brain. The patient also went down for a CT scan of the abdomen and pelvis that has not been read yet. Patient has been explained that the next step would be an EGD and colonoscopy due to his early satiety and weight loss with anemia. The patient states that he does not want to undergo any these procedures. He also reports that he would just like to be made comfortable. I have explained to him that if he continues bleeding he may need a blood transfusion and if his blood count goes low enough he may have a stroke or a heart attack. He states he understands and still does not want to proceed with any endoscopic procedures at this time. I have explained this to the hospitalist. Please call me if the patient changes his mind or if there is anything on the CT scan that is found that has made the patient reconsider his decision.   Thank you for involving me in the care of this patient.      LOS: 0  days   Ollen Bowl, MD  10/27/2015, 2:39 PM   Note: This dictation was prepared with Dragon dictation along with smaller phrase technology. Any transcriptional errors that result from this process are unintentional.

## 2015-10-27 NOTE — ED Provider Notes (Addendum)
[facility] Emergency Department Provider Note  ____________________________________________  Time seen: Approximately 7:14 AM  I have reviewed the triage vital signs and the nursing notes.   HISTORY  Chief Complaint Weakness    HPI [name] is a 54 y.o. 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 The patient was admitted earlier this month for abdominal pain thought to be secondary to constipation as well as acute on chronic hyponatremia. He was again seen in this emergency department on the sixth and the eighth of this month with complaints of generalized weakness as well as other nonspecific complaints. During his last ER visit, he did receive IV fluids however he reports continued fatigue. No chest pain or difficulty breathing. No vomiting, diarrhea, fevers or chills.   Past Medical History  Diagnosis Date  . Hypertension   . Seizures ([place])   . Kidney disease   . Anemia   . Migraine   . Hyponatremia     chronic    Patient Active Problem List   Diagnosis Date Noted  . Protein-calorie malnutrition, severe ([place]) 09/07/2015  . Hyponatremia 09/05/2015  . Elevated troponin 09/05/2015  . Hypokalemia 09/05/2015  . Leukopenia 09/05/2015  . Renal insufficiency 09/05/2015  . Tremor, essential 07/15/2015  . Essential hypertension 07/15/2015  . Chronic renal insufficiency 07/15/2015  . Leg swelling 07/15/2015  . Chest pain 04/17/2015    Past Surgical History  Procedure Laterality Date  . None      Current Outpatient Rx  Name  Route  Sig  Dispense  Refill  . amLODipine (NORVASC) 5 MG tablet   Oral   Take 5 mg by mouth daily.         . butalbital-acetaminophen-caffeine (FIORICET, ESGIC) 50-325-40 MG per  tablet   Oral   Take 1-2 tablets by mouth every 4 (four) hours as needed for migraine.         . carbamazepine (TEGRETOL) 200 MG tablet   Oral   Take 200-400 mg by mouth 3 (three) times daily. Pt takes two in the morning, one at noon, and two at bedtime.         . divalproex (DEPAKOTE) 500 MG DR tablet   Oral   Take 1-2 tablets (500-1,000 mg total) by mouth 3 (three) times daily. Pt takes two in the morning, one at noon, and two at bedtime.   150 tablet   0   . feeding supplement, ENSURE ENLIVE, (ENSURE ENLIVE) LIQD   Oral   Take 237 mLs by mouth 2 (two) times daily between meals.   10 Bottle   12   . lisinopril (PRINIVIL,ZESTRIL) 20 MG tablet   Oral   Take 20 mg by mouth daily.         . pantoprazole (PROTONIX) 40 MG tablet   Oral   Take 1 tablet (40 mg total) by mouth daily.   30 tablet   2   . propranolol (INDERAL) 20 MG tablet   Oral   Take 40 mg by mouth 2 (two) times daily.           Allergies Review of patient's allergies indicates no known allergies.  Family History  Problem Relation Age of Onset  . Heart attack Father   . Kidney disease Father     Social History Social History  Substance Use Topics  .  Smoking status: Never Smoker   . Smokeless tobacco: None  . Alcohol Use: No    Review of Systems Constitutional: No fever/chills Eyes: No visual changes. ENT: No sore throat. Cardiovascular: Denies chest pain. Respiratory: Denies shortness of breath. Gastrointestinal: No abdominal pain.  No nausea, no vomiting.  No diarrhea.  No constipation. Genitourinary: Negative for dysuria. Musculoskeletal: Negative for back pain. Skin: Negative for rash. Neurological: Negative for headaches, focal weakness or numbness.  10-point ROS otherwise negative.  ____________________________________________   PHYSICAL EXAM:  VITAL SIGNS: ED Triage Vitals  Enc Vitals Group     BP 10/27/15 0652 149/94 mmHg     Pulse Rate 10/27/15 0652 75     Resp  10/27/15 0652 16     Temp 10/27/15 0652 98.1 F (36.7 C)     Temp Source 10/27/15 0652 Oral     SpO2 10/27/15 0652 100 %     Weight 10/27/15 0652 108 lb 9.6 oz (49.261 kg)     Height 10/27/15 0652 5\' 3"  (1.6 m)     Head Cir --      Peak Flow --      Pain Score --      Pain Loc --      Pain Edu? --      Excl. in [place]? --     Constitutional: Alert and oriented. Chronically ill-appearing and in no acute distress. Eyes: Conjunctivae are normal. PERRL. EOMI. Head: Atraumatic. Nose: No congestion/rhinnorhea. Mouth/Throat: Mucous membranes are moist.  Oropharynx non-erythematous. Neck: No stridor.  Cardiovascular: Normal rate, regular rhythm. Grossly normal heart sounds.  Good peripheral circulation. Respiratory: Normal respiratory effort.  No retractions. Lungs CTAB. Gastrointestinal: Soft and nontender. No distention. No abdominal bruits. No CVA tenderness. Genitourinary: deferred Rectal: brown stool in rectal vault is guaiac negative Musculoskeletal: No lower extremity tenderness nor edema.  No joint effusions. Neurologic:  Normal speech and language. No gross focal neurologic deficits are appreciated. + chronic resting tremor Skin:  Skin is warm, dry and intact. No rash noted. Psychiatric: Mood and affect are normal. Speech and behavior are normal.  ____________________________________________   LABS (all labs ordered are listed, but only abnormal results are displayed)  Labs Reviewed  BASIC METABOLIC PANEL - Abnormal; Notable for the following:    Sodium 130 (*)    Chloride 95 (*)    BUN 23 (*)    All other components within normal limits  CBC - Abnormal; Notable for the following:    WBC 1.8 (*)    RBC 2.18 (*)    Hemoglobin 7.3 (*)    HCT 21.7 (*)    RDW 17.2 (*)    All other components within normal limits  VALPROIC ACID LEVEL - Abnormal; Notable for the following:    Valproic Acid Lvl 115 (*)    All other components within normal limits  TROPONIN I  CARBAMAZEPINE  LEVEL, TOTAL  URINALYSIS COMPLETEWITH MICROSCOPIC ([facility] ONLY)  URINALYSIS COMPLETEWITH MICROSCOPIC ([facility] ONLY)  TYPE AND SCREEN  ABO/RH  PREPARE RBC (CROSSMATCH)   ____________________________________________  EKG  ED ECG REPORT I, Joanne Gavel, the attending physician, personally viewed and interpreted this ECG.   Date: 10/27/2015  EKG Time: 07:09  Rate: 66  Rhythm: sinus rhythm  Axis: normal  Intervals:none  ST&T Change: No acute ST elevation. Q waves in lead 2, 3, aVF, V2, V3. T-wave inversions in V2, V3. ST morphologies are similar to 11/62,016 however the repeat to be new Q waves and T-wave inversions in  V2 and V3  ____________________________________________  RADIOLOGY  CT head IMPRESSION: No skull fracture or intracranial hemorrhage.  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 Ventricular prominence may reflect result of central atrophy rather than hydrocephalus although atrophy has progressed since 2011.  No CT evidence of large acute infarct.  No intracranial mass lesion noted on this unenhanced exam.  CXR  IMPRESSION: No acute abnormality seen. ____________________________________________   PROCEDURES  Procedure(s) performed: None  Critical Care performed: No  ____________________________________________   INITIAL IMPRESSION / ASSESSMENT AND PLAN / ED COURSE  Pertinent labs & imaging results that were available during my care of the patient were reviewed by me and considered in my medical decision making (see chart for details).  Lincon Tassy is a 54 y.o. 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 On exam, he is chronically ill-appearing but in no acute distress. Vital signs stable, he is afebrile. Clinical picture is most  consistent with symptomatic anemia. Hemoglobin today is 7.3, a significant decrease from 2 months ago at which point his hemoglobin was 9.7. Of note, his hemoglobin was noted to be similarly decreased one week ago during his most recent hospitalization, he was diagnosed with an unspecified anemia, and it appears his hemoglobin was just trended. He has brown stool in the rectal vault which is guaiac negative, no evidence to suggest active GI bleed. Given his symptoms, rapid decline in hemoglobin of the past 2 weeks, I consented him to receive 1 unit PRBCs. Case discussed with hospitalist, Dr. Volanda Napoleon, for admission at 9:25 am. I discussed his case with Dr. Fletcher Anon of cardiology who has reviewed his EKG and agrees no STEMI. ____________________________________________   FINAL CLINICAL IMPRESSION(S) / ED DIAGNOSES  Final diagnoses:  Symptomatic anemia  Lightheadedness  Other fatigue      Joanne Gavel, MD 10/27/15 [id number]  Joanne Gavel, MD 10/27/15 [phone]

## 2015-10-27 NOTE — ED Notes (Signed)
Patient presents to ED 2 via EMS from home. Patient reports generalized weakness x 1 week. Patient seen here on 11/8 for the same with no significant findings per his report.

## 2015-10-27 NOTE — Consult Note (Signed)
[facility] Face-to-Face Psychiatry Consult   Reason for Consult:  Consult for this 54-year-old man with really no significant psychiatric past history. Consult for bizarre behavior" Referring Physician:  Gouru Patient Identification: [name] MRN:  [id number] Principal Diagnosis: Poverty Diagnosis:   Patient Active Problem List   Diagnosis Date Noted  . Near syncope [R55] 10/27/2015  . Symptomatic anemia [D64.9]   . Early satiety [R68.81]   . Protein-calorie malnutrition, severe (HCC) [E43] 09/07/2015  . Hyponatremia [E87.1] 09/05/2015  . Elevated troponin [R79.89] 09/05/2015  . Hypokalemia [E87.6] 09/05/2015  . Leukopenia [D72.819] 09/05/2015  . Renal insufficiency [N28.9] 09/05/2015  . Tremor, essential [G25.0] 07/15/2015  . Essential hypertension [I10] 07/15/2015  . Chronic renal insufficiency [N18.9] 07/15/2015  . Leg swelling [M79.89] 07/15/2015  . Chest pain [R07.9] 04/17/2015    Total Time spent with patient: 1 hour  Subjective:   [name] is a 54 y.o. male patient admitted with " I am weak".  HPI:  Information from the patient and the chart. Patient interviewed. Chart reviewed. Labs reviewed. Consult was placed for "bizarre behavior". I question the nurse on duty as to what this meant and he told me that the concern had to do with the fact that the patient wears a wig. Also that he had declined colonoscopy. On my interview I found the patient to not be wearing a week and I did not inquire about it. Patient tells me he came to the hospital because he is weak. He is aware that he has been diagnosed with anemia. He tells me the reason he has declined colonoscopy is fear of the financial cost. He tells me that he has been getting weaker lately and that he has been running completely out of money. He has applied for disability but has not been approved yet. He is a little evasive about how he has been managing to survive but indicates that he is trying to rely on some friends. The  patient denies depression. Denies suicidal ideation. Denies sleep problems. Denies auditory or visual hallucinations. Denies any substance abuse. He says that he has been continuing to take his usual outpatient medicines.  Social history: Patient says he lives in a condominium. He indicates however that he has not had any money in a long time. It's not clear how he has been affording to continue staying there. Has no family living that he stays in touch with. 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  Medical history: Patient has a history of a seizure disorder that apparently started in mid life. No known cause. Has been stable on Depakote and Tegretol for years. Has not had a seizure in about 5 years. He is currently in the hospital with profound anemia, unclear cause.  Substance abuse history: Says he drinks very little no history of drug or alcohol abuse.  Past Psychiatric History: Patient says as a child he was diagnosed with ADD and treated with Ritalin but has not taken that medicine in decades. Never had a psychiatric hospitalization. No history of suicide attempts no history of psychosis.  Risk to Self: Is patient at risk for suicide?: No Risk to Others:   Prior Inpatient Therapy:   Prior Outpatient Therapy:    Past Medical History:  Past Medical History  Diagnosis Date  . Hypertension   . Seizures (HCC)   . Kidney disease   .   Anemia   . Migraine   . Hyponatremia     chronic    Past Surgical History  Procedure Laterality Date  . None     Family History:  Family History  Problem Relation Age of Onset  . Heart attack Father   . Kidney disease Father    Family Psychiatric  History: Patient denies there being any kind of family history of mental health problems at all. Social History:  History  Alcohol Use No     History  Drug Use No    Social History   Social History  .  Marital Status: Single    Spouse Name: N/A  . Number of Children: N/A  . Years of Education: N/A   Social History Main Topics  . Smoking status: Never Smoker   . Smokeless tobacco: None  . Alcohol Use: No  . Drug Use: No  . Sexual Activity: Not Asked   Other Topics Concern  . None   Social History Narrative   Cross dresses    Additional Social History:                          Allergies:  No Known Allergies  Labs:  Results for orders placed or performed during the hospital encounter of 10/27/15 (from the past 48 hour(s))  Basic metabolic panel     Status: Abnormal   Collection Time: 10/27/15  7:03 AM  Result Value Ref Range   Sodium 130 (L) 135 - 145 mmol/L   Potassium 3.5 3.5 - 5.1 mmol/L   Chloride 95 (L) 101 - 111 mmol/L   CO2 27 22 - 32 mmol/L   Glucose, Bld 92 65 - 99 mg/dL   BUN 23 (H) 6 - 20 mg/dL   Creatinine, Ser 1.19 0.61 - 1.24 mg/dL   Calcium 9.0 8.9 - 10.3 mg/dL   GFR calc non Af Amer >60 >60 mL/min   GFR calc Af Amer >60 >60 mL/min    Comment: (NOTE) The eGFR has been calculated using the CKD EPI equation. This calculation has not been validated in all clinical situations. eGFR's persistently <60 mL/min signify possible Chronic Kidney Disease.    Anion gap 8 5 - 15  CBC     Status: Abnormal   Collection Time: 10/27/15  7:03 AM  Result Value Ref Range   WBC 1.8 (L) 3.8 - 10.6 K/uL   RBC 2.18 (L) 4.40 - 5.90 MIL/uL   Hemoglobin 7.3 (L) 13.0 - 18.0 g/dL   HCT 21.7 (L) 40.0 - 52.0 %   MCV 99.7 80.0 - 100.0 fL   MCH 33.5 26.0 - 34.0 pg   MCHC 33.6 32.0 - 36.0 g/dL   RDW 17.2 (H) 11.5 - 14.5 %   Platelets 158 150 - 440 K/uL  Troponin I     Status: None   Collection Time: 10/27/15  7:03 AM  Result Value Ref Range   Troponin I <0.03 <0.031 ng/mL    Comment:        NO INDICATION OF MYOCARDIAL INJURY.   Carbamazepine level, total     Status: None   Collection Time: 10/27/15  7:03 AM  Result Value Ref Range   Carbamazepine Lvl 7.2 4.0 -  12.0 ug/mL  Valproic acid level     Status: Abnormal   Collection Time: 10/27/15  7:03 AM  Result Value Ref Range   Valproic Acid Lvl 115 (H) 50.0 - 100.0 ug/mL  Urinalysis complete,   with microscopic ([facility] only)     Status: Abnormal   Collection Time: 10/27/15  8:42 AM  Result Value Ref Range   Color, Urine YELLOW (A) YELLOW   APPearance CLEAR (A) CLEAR   Glucose, UA NEGATIVE NEGATIVE mg/dL   Bilirubin Urine NEGATIVE NEGATIVE   Ketones, ur TRACE (A) NEGATIVE mg/dL   Specific Gravity, Urine 1.014 1.005 - 1.030   Hgb urine dipstick NEGATIVE NEGATIVE   pH 6.0 5.0 - 8.0   Protein, ur NEGATIVE NEGATIVE mg/dL   Nitrite NEGATIVE NEGATIVE   Leukocytes, UA NEGATIVE NEGATIVE   RBC / HPF 0-5 0 - 5 RBC/hpf   WBC, UA 0-5 0 - 5 WBC/hpf   Bacteria, UA NONE SEEN NONE SEEN   Squamous Epithelial / LPF 0-5 (A) NONE SEEN   Mucous PRESENT    Hyaline Casts, UA PRESENT   Type and screen [facility]     Status: None (Preliminary result)   Collection Time: 10/27/15  8:42 AM  Result Value Ref Range   ABO/RH(D) O POS    Antibody Screen NEG    Sample Expiration 10/30/2015    Unit Number [id number]    Blood Component Type RCLI PHER 1    Unit division 00    Status of Unit ISSUED    Transfusion Status OK TO TRANSFUSE    Crossmatch Result Compatible   Prepare RBC     Status: None   Collection Time: 10/27/15  8:45 AM  Result Value Ref Range   Order Confirmation ORDER PROCESSED BY BLOOD BANK   ABO/Rh     Status: None   Collection Time: 10/27/15  8:46 AM  Result Value Ref Range   ABO/RH(D) O POS   Hemoglobin     Status: Abnormal   Collection Time: 10/27/15  5:58 PM  Result Value Ref Range   Hemoglobin 8.7 (L) 13.0 - 18.0 g/dL    Current Facility-Administered Medications  Medication Dose Route Frequency Provider Last Rate Last Dose  . acetaminophen (TYLENOL) tablet 650 mg  650 mg Oral Q6H PRN Nicholes Mango, MD       Or  . acetaminophen (TYLENOL) suppository 650 mg  650 mg  Rectal Q6H PRN Nicholes Mango, MD      . amLODipine (NORVASC) tablet 5 mg  5 mg Oral Daily Nicholes Mango, MD   5 mg at 10/27/15 1509  . butalbital-acetaminophen-caffeine (FIORICET, ESGIC) 50-325-40 MG per tablet 1-2 tablet  1-2 tablet Oral Q4H PRN Nicholes Mango, MD      . carbamazepine (TEGRETOL) tablet 200 mg  200 mg Oral Q1200 Nicholes Mango, MD   200 mg at 10/27/15 1509  . [START ON 10/28/2015] carbamazepine (TEGRETOL) tablet 400 mg  400 mg Oral QAC breakfast Nicholes Mango, MD      . carbamazepine (TEGRETOL) tablet 400 mg  400 mg Oral QHS Nicholes Mango, MD      . Derrill Memo ON 10/28/2015] divalproex (DEPAKOTE) DR tablet 1,000 mg  1,000 mg Oral QAC breakfast Aruna Gouru, MD      . divalproex (DEPAKOTE) DR tablet 1,000 mg  1,000 mg Oral QHS Aruna Gouru, MD      . divalproex (DEPAKOTE) DR tablet 500 mg  500 mg Oral Q1200 Nicholes Mango, MD   500 mg at 10/27/15 1509  . docusate sodium (COLACE) capsule 100 mg  100 mg Oral BID Nicholes Mango, MD   100 mg at 10/27/15 1508  . feeding supplement (ENSURE ENLIVE) (ENSURE ENLIVE) liquid 237 mL  1 Bottle Oral  BID BM Aruna Gouru, MD   237 mL at 10/27/15 1400  . lisinopril (PRINIVIL,ZESTRIL) tablet 20 mg  20 mg Oral Daily Aruna Gouru, MD   20 mg at 10/27/15 1508  . ondansetron (ZOFRAN) tablet 4 mg  4 mg Oral Q6H PRN Aruna Gouru, MD       Or  . ondansetron (ZOFRAN) injection 4 mg  4 mg Intravenous Q6H PRN Aruna Gouru, MD      . pantoprazole (PROTONIX) EC tablet 40 mg  40 mg Oral Daily Aruna Gouru, MD   40 mg at 10/27/15 1508  . propranolol (INDERAL) tablet 40 mg  40 mg Oral BID Aruna Gouru, MD   40 mg at 10/27/15 1513  . sodium chloride 0.9 % injection 3 mL  3 mL Intravenous Q12H Aruna Gouru, MD   3 mL at 10/27/15 1200    Musculoskeletal: Strength & Muscle Tone: decreased Gait & Station: unsteady Patient leans: N/A  Psychiatric Specialty Exam: Review of Systems  HENT: Negative.   Eyes: Negative.   Respiratory: Negative.   Cardiovascular: Negative.   Gastrointestinal:  Negative.   Musculoskeletal: Negative.   Skin: Negative.   Neurological: Positive for tremors, seizures and weakness.  Psychiatric/Behavioral: Negative for depression, suicidal ideas, hallucinations, memory loss and substance abuse. The patient is not nervous/anxious and does not have insomnia.     Blood pressure 160/78, pulse 61, temperature 98 F (36.7 C), temperature source Oral, resp. rate 20, height 5' 3" (1.6 m), weight 52.164 kg (115 lb), SpO2 99 %.Body mass index is 20.38 kg/(m^2).  General Appearance: Disheveled  Eye Contact::  Fair  Speech:  Slow  Volume:  Decreased  Mood:  Euthymic  Affect:  Flat  Thought Process:  Goal Directed  Orientation:  Full (Time, Place, and Person)  Thought Content:  Negative  Suicidal Thoughts:  No  Homicidal Thoughts:  No  Memory:  Immediate;   Good Recent;   Fair Remote;   Fair  Judgement:  Impaired  Insight:  Fair  Psychomotor Activity:  Decreased  Concentration:  Poor  Recall:  Fair  Fund of Knowledge:Fair  Language: Fair  Akathisia:  No  Handed:  Right  AIMS (if indicated):     Assets:  Communication Skills Desire for Improvement Housing  ADL's:  Intact  Cognition: Impaired,  Mild  Sleep:      Treatment Plan Summary: Plan 54-year-old man with a seizure disorder currently profound anemia. 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 Patient describes himself as being tired but denies all symptoms of depression. Unfortunately this appears to be a situation in which I spent considerable time talking with the patient and working him up and yet was unable to come up with a clear psychiatric diagnosis. No psychiatric treatment is indicated. I do recommend social work get involved and perhaps Adult Protective Services. It sounds like the patient is increasingly impoverished and unable to take care of himself at home. The patient indicated that he was refusing colonoscopy because of concern about the price, but that if he could be assured  that it would not cost him too much he would be willing to have it done. We talked about the pros and cons of that and I educated him about the potentially life threatening nature of his anemia. Does not appear to be psychotic.  Disposition: Patient does not meet criteria for psychiatric inpatient admission.    10/27/2015 7:59 PM  

## 2015-10-28 LAB — COMPREHENSIVE METABOLIC PANEL
ALT: 20 U/L (ref 17–63)
AST: 20 U/L (ref 15–41)
Albumin: 2.8 g/dL — ABNORMAL LOW (ref 3.5–5.0)
Alkaline Phosphatase: 59 U/L (ref 38–126)
Anion gap: 8 (ref 5–15)
BILIRUBIN TOTAL: 0.7 mg/dL (ref 0.3–1.2)
BUN: 23 mg/dL — ABNORMAL HIGH (ref 6–20)
CHLORIDE: 92 mmol/L — AB (ref 101–111)
CO2: 27 mmol/L (ref 22–32)
CREATININE: 1.15 mg/dL (ref 0.61–1.24)
Calcium: 8.6 mg/dL — ABNORMAL LOW (ref 8.9–10.3)
Glucose, Bld: 85 mg/dL (ref 65–99)
POTASSIUM: 3.9 mmol/L (ref 3.5–5.1)
Sodium: 127 mmol/L — ABNORMAL LOW (ref 135–145)
TOTAL PROTEIN: 5.2 g/dL — AB (ref 6.5–8.1)

## 2015-10-28 LAB — HEMOGLOBIN A1C: HEMOGLOBIN A1C: 5.3 % (ref 4.0–6.0)

## 2015-10-28 LAB — TYPE AND SCREEN
ABO/RH(D): O POS
Antibody Screen: NEGATIVE
UNIT DIVISION: 0

## 2015-10-28 LAB — CBC
HCT: 23.7 % — ABNORMAL LOW (ref 40.0–52.0)
Hemoglobin: 8.3 g/dL — ABNORMAL LOW (ref 13.0–18.0)
MCH: 33.9 pg (ref 26.0–34.0)
MCHC: 34.9 g/dL (ref 32.0–36.0)
MCV: 97.3 fL (ref 80.0–100.0)
PLATELETS: 135 10*3/uL — AB (ref 150–440)
RBC: 2.44 MIL/uL — AB (ref 4.40–5.90)
RDW: 18.7 % — AB (ref 11.5–14.5)
WBC: 2.5 10*3/uL — AB (ref 3.8–10.6)

## 2015-10-28 LAB — TSH: TSH: 1.176 u[IU]/mL (ref 0.350–4.500)

## 2015-10-28 LAB — GLUCOSE, CAPILLARY
GLUCOSE-CAPILLARY: 107 mg/dL — AB (ref 65–99)
Glucose-Capillary: 87 mg/dL (ref 65–99)

## 2015-10-28 MED ORDER — LORAZEPAM 2 MG/ML IJ SOLN
2.0000 mg | Freq: Four times a day (QID) | INTRAMUSCULAR | Status: DC
  Administered 2015-10-28: 05:00:00 2 mg via INTRAVENOUS
  Filled 2015-10-28 (×2): qty 1

## 2015-10-28 NOTE — Plan of Care (Signed)
Problem: Nutrition: Goal: Adequate nutrition will be maintained Outcome: Progressing Patient had no c/o pain this shift  VSS, patient was very lethargic in AM.  Note Xanax given on prior shift at 0433 Xanax discontinued  Patient appetite is good to fair  No bleeding noted this shift

## 2015-10-28 NOTE — Plan of Care (Signed)
Problem: Education: Goal: Knowledge of [facility] Education information/materials will improve Outcome: Progressing Patient admitted for abdominal pain.  Patient provided education on pain and asking for medication before pain becomes unbearable.  Patient denied pain this shift.    Problem: Safety: Goal: Ability to remain free from injury will improve Outcome: Progressing Patient is high fall risk due to frequent falls at home and weakness.  He has tremors and is weak and unsteady on feet.  He was educated on the need to remain in bed and call for assistance.  Bed alarm on throughout shift.  Patient became agitated and tried continually to get out of bed this shift.  Nurse obtained order for 2mg  Ativan Q6 for agitation.  Patient given medication and became more calm and cooperative.  Patient remains free from injury.  Problem: Tissue Perfusion: Goal: Risk factors for ineffective tissue perfusion will decrease Outcome: Not Progressing Patient refuses SCD and Ted hose this shift.  He has knee-high pantyhose and prefers to wear that instead of Ted hose.  Patient educated on need to reduce possibility of VTE and continues to refuse SCD.
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
## 2015-10-29 LAB — CBC
HEMATOCRIT: 25 % — AB (ref 40.0–52.0)
HEMOGLOBIN: 8.9 g/dL — AB (ref 13.0–18.0)
MCH: 34.6 pg — AB (ref 26.0–34.0)
MCHC: 35.7 g/dL (ref 32.0–36.0)
MCV: 97.1 fL (ref 80.0–100.0)
Platelets: 137 10*3/uL — ABNORMAL LOW (ref 150–440)
RBC: 2.58 MIL/uL — ABNORMAL LOW (ref 4.40–5.90)
RDW: 18.5 % — ABNORMAL HIGH (ref 11.5–14.5)
WBC: 3.1 10*3/uL — ABNORMAL LOW (ref 3.8–10.6)

## 2015-10-29 LAB — VALPROIC ACID LEVEL: VALPROIC ACID LVL: 115 ug/mL — AB (ref 50.0–100.0)

## 2015-10-29 LAB — BASIC METABOLIC PANEL
Anion gap: 7 (ref 5–15)
BUN: 23 mg/dL — AB (ref 6–20)
CALCIUM: 8.9 mg/dL (ref 8.9–10.3)
CHLORIDE: 92 mmol/L — AB (ref 101–111)
CO2: 28 mmol/L (ref 22–32)
CREATININE: 1.12 mg/dL (ref 0.61–1.24)
GFR calc Af Amer: >60 mL/min (ref >60)
GFR calc non Af Amer: >60 mL/min (ref >60)
Glucose, Bld: 92 mg/dL (ref 65–99)
Potassium: 4.3 mmol/L (ref 3.5–5.1)
Sodium: 127 mmol/L — ABNORMAL LOW (ref 135–145)

## 2015-10-29 LAB — SODIUM
SODIUM: 125 mmol/L — AB (ref 135–145)
Sodium: 126 mmol/L — ABNORMAL LOW (ref 135–145)

## 2015-10-29 MED ORDER — SODIUM CHLORIDE 0.9 % IV SOLN
INTRAVENOUS | Status: DC
  Administered 2015-10-29: 10:00:00 via INTRAVENOUS

## 2015-10-29 NOTE — Consult Note (Signed)
  Psychiatry: Attempted to follow-up with this patient today but found him asleep in the room and he would not wake up even though I spoke his name quite loudly several times. It looks like he has been oversedated quite a bit recently. I had not felt like he had a really treatable psychiatric condition the other day but I would like to continue to follow-up with him. I will try and drop by tomorrow and see how he is doing.

## 2015-10-29 NOTE — Progress Notes (Signed)
PT Cancellation Note  Patient Details Name: [name] MRN: [id number] DOB: 03/14/1961   Cancelled Treatment:    Reason Eval/Treat Not Completed: Other (comment) (See PT note for further details) PT attempted this afternoon, however pt non-cooperative and wishing to sleep despite encouragement from therapist. Pt only partially answering orientation questions and inconsistently attending to task. Will attempt at later time/date when pt more alert and motivated.    Janyth Contes 10/29/2015, 11:49 AM  Janyth Contes, SPT. [phone]

## 2015-10-29 NOTE — Progress Notes (Signed)
[facility] [place] NAME: [name]    MR#:  [id number]  DATE OF BIRTH:  [birth year]  SUBJECTIVE:  CHIEF COMPLAINT: Patient is awake and alert. He has discussed with the social worker and wants to get more investigations including EGD and colonoscopy regarding his weight loss. Denies any other complaints.  REVIEW OF SYSTEMS:  CONSTITUTIONAL: No fever, reporting fatigue and progressively worsening weakness. Reporting unintentional weight loss of 30 pounds in 3 months  EYES: No blurred or double vision.  EARS, NOSE, AND THROAT: No tinnitus or ear pain.  RESPIRATORY: No cough, shortness of breath, wheezing or hemoptysis.  CARDIOVASCULAR: No chest pain, orthopnea, edema.  GASTROINTESTINAL: No nausea, vomiting, diarrhea .reports chronic abdominal pain.  GENITOURINARY: No dysuria, hematuria.  ENDOCRINE: No polyuria, nocturia,  HEMATOLOGY: No anemia, easy bruising or bleeding SKIN: No rash or lesion. MUSCULOSKELETAL: No joint pain or arthritis.  NEUROLOGIC: No tingling, numbness, weakness.  PSYCHIATRY: No anxiety or depression.  DRUG ALLERGIES:  No Known Allergies  VITALS:  Blood pressure 118/69, pulse 50, temperature 97.7 F (36.5 C), temperature source Oral, resp. rate 20, height 5\' 3"  (1.6 m), weight 56.155 kg (123 lb 12.8 oz), SpO2 100 %.  PHYSICAL EXAMINATION:  GENERAL:  54 y.o.-year-old patient lying in the bed with no acute distress.  EYES: Pupils equal, round, reactive to light and accommodation. No scleral icterus.  HEENT: Head atraumatic, normocephalic. Oropharynx and nasopharynx clear.  NECK:  Supple, no jugular venous distention. No thyroid enlargement, no tenderness.  LUNGS: Normal breath sounds bilaterally, no wheezing, rales,rhonchi or crepitation. No use of accessory muscles of respiration.  CARDIOVASCULAR: S1, S2 normal. No murmurs, rubs, or gallops.  ABDOMEN: Soft, nontender, nondistended. Bowel sounds  present. No organomegaly or mass.  EXTREMITIES: No pedal edema, cyanosis, or clubbing.  NEUROLOGIC: Lethargic  PSYCHIATRIC: The patient is lethargic  SKIN: No obvious rash, lesion, or ulcer.    LABORATORY PANEL:   CBC  Recent Labs Lab 10/29/15 0525  WBC 3.1*  HGB 8.9*  HCT 25.0*  PLT 137*   ------------------------------------------------------------------------------------------------------------------  Chemistries   Recent Labs Lab 10/28/15 0503 10/29/15 0525 10/29/15 0859  NA 127* 127* 125*  K 3.9 4.3  --   CL 92* 92*  --   CO2 27 28  --   GLUCOSE 85 92  --   BUN 23* 23*  --   CREATININE 1.15 1.12  --   CALCIUM 8.6* 8.9  --   AST 20  --   --   ALT 20  --   --   ALKPHOS 59  --   --   BILITOT 0.7  --   --    ------------------------------------------------------------------------------------------------------------------  Cardiac Enzymes  Recent Labs Lab 10/27/15 0703  TROPONINI <0.03   ------------------------------------------------------------------------------------------------------------------  RADIOLOGY:  No results found.  EKG:   Orders placed or performed during the hospital encounter of 10/27/15  . ED EKG within 10 minutes  . ED EKG within 10 minutes  . EKG 12-Lead  . EKG 12-Lead  . EKG 12-Lead  . EKG 12-Lead  . ED EKG  . ED EKG  . EKG 12-Lead  . EKG 12-Lead  . EKG 12-Lead  . EKG 12-Lead    ASSESSMENT AND PLAN:   1. Near syncope secondary to Symptomatic anemia with chronic abdominal pain Transfused 1 unit of blood and repeat hemoglobin at 8.3 Patient refused EGD and colonoscopy as recommended by gastroenterology from financial concerns. Social worker is consulted Stool  for occult blood is negative in the ED. We'll check 1 more sample of the stool for occult blood Will continue GI prophylaxis with Protonix  CT chest, abdomen and pelvis are negative except for lower rib fractures  #. Failure to thrive-unintentional weight loss  of 30 pounds in 3 months CT head is negative for any metastases  CT chest, abdomen and pelvis -no malignancy  Patient initially refused EGD and colonoscopy from financial concerns. His financial concerns were addressed by social worker and patient is willing to do it more investigations done. Discussed with gastroenterology Dr. Candace Cruise, he is going to see the patient.  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 labs Mentating fine. If no improvement will consult nephrology   #Partially healed lower rib fractures-could be from malnourishment from failure to thrive Patient is reporting recent fall. Denies any abuse or trauma/ injury   #. Essential tremors-continue home medication propranolol  #. Hypertension-continue amlodipine and ACE inhibitor  #. History of seizures Resume his home medication Tegretol and Depakote His serum sodium levels are not improving with fluid restriction will consult neurology regarding the alternative seizure medication     All the records are reviewed and case discussed with Care Management/Social Workerr. Management plans discussed with the patient, family and they are in agreement.  CODE STATUS: Full code  TOTAL TIME TAKING CARE OF THIS PATIENT:35  minutes.   POSSIBLE D/C IN 1-2 DAYS, DEPENDING ON CLINICAL CONDITION.   Nicholes Mango M.D on 10/29/2015 at 4:05 PM  Between 7am to 6pm - Pager - [phone] After 6pm go to [email or web address] - password [email or web address] [facility]  Office  [phone]  CC: Primary care physician; [name], MD

## 2015-10-29 NOTE — Progress Notes (Signed)
Initial Nutrition Assessment  DOCUMENTATION CODES:   Severe malnutrition in context of chronic illness  INTERVENTION:   Meals and Snacks: Cater to patient preferences. As on previous admissions will send cups with lids. Recommend liberalizing diet order if po intake poor.  Medical Food Supplement Therapy: Agree with Ensure Enlive po BID, each supplement provides 350 kcal and 20 grams of protein. Will also send Magic Cup as pt reports liking ice cream.    NUTRITION DIAGNOSIS:   Malnutrition related to chronic illness as evidenced by percent weight loss, severe depletion of muscle mass, moderate depletion of body fat.  GOAL:   Patient will meet greater than or equal to 90% of their needs  MONITOR:    (Energy Intake, Anthropometrics, Electrolyte and renal Profile, Digestive system)  REASON FOR ASSESSMENT:   Consult Poor PO  ASSESSMENT:   Pt admitted with weakness, near syncope, hyponatremia and FTT with anemia, s/p transfusion.  Past Medical History  Diagnosis Date  . Hypertension   . Seizures ([place])   . Kidney disease   . Anemia   . Migraine   . Hyponatremia     chronic    Diet Order:  Diet renal with fluid restriction Fluid restriction:: 1500 mL Fluid; Room service appropriate?: Yes; Fluid consistency:: Thin    Current Nutrition: Pt ate 0% of lunch including ensure today on visit, however reported that he had eaten. Pt very lethargic, sleepy on visit.   Food/Nutrition-Related History: Pt could not elaborate on po intake PTA. RD familiar with pt from previous admissions, where upon pt admitted living alone and at times is too weak to make meals. Neighbor friends have reported pt eats mostly tater tots on previous admissions.   Scheduled Medications:  . amLODipine  5 mg Oral Daily  . carbamazepine  200 mg Oral Q1200  . carbamazepine  400 mg Oral QAC breakfast  . carbamazepine  400 mg Oral QHS  . divalproex  1,000 mg Oral QAC breakfast  . divalproex  1,000 mg Oral  QHS  . divalproex  500 mg Oral Q1200  . docusate sodium  100 mg Oral BID  . feeding supplement (ENSURE ENLIVE)  1 Bottle Oral BID BM  . lisinopril  20 mg Oral Daily  . pantoprazole  40 mg Oral Daily  . propranolol  40 mg Oral BID  . sodium chloride  3 mL Intravenous Q12H    Continuous Medications:  . sodium chloride 75 mL/hr at 10/29/15 1022    Electrolyte/Renal Profile and Glucose Profile:   Recent Labs Lab 10/27/15 0703 10/28/15 0503 10/29/15 0525 10/29/15 0859  NA 130* 127* 127* 125*  K 3.5 3.9 4.3  --   CL 95* 92* 92*  --   CO2 27 27 28   --   BUN 23* 23* 23*  --   CREATININE 1.19 1.15 1.12  --   CALCIUM 9.0 8.6* 8.9  --   GLUCOSE 92 85 92  --    Protein Profile:  Recent Labs Lab 10/23/15 1719 10/28/15 0503  ALBUMIN 3.8 2.8*   Nutritional Anemia Profile:  CBC Latest Ref Rng 10/29/2015 10/28/2015 10/27/2015  WBC 3.8 - 10.6 K/uL 3.1(L) 2.5(L) -  Hemoglobin 13.0 - 18.0 g/dL 8.9(L) 8.3(L) 8.7(L)  Hematocrit 40.0 - 52.0 % 25.0(L) 23.7(L) -  Platelets 150 - 440 K/uL 137(L) 135(L) -    Gastrointestinal Profile: Last BM:  10/27/2015   Nutrition-Focused Physical Exam Findings: Nutrition-Focused physical exam completed. Findings are mild-moderate fat depletion, moderate-severe and mild muscle depletion,  and no edema.     Weight Change: Pt with 16% weight loss in 4-6 months per [facility]. Pt reported per MD note 30lbs unintentional weight loss in 3 months (20% loss)   Height:   Ht Readings from Last 1 Encounters:  10/27/15 5\' 3"  (1.6 m)    Weight:   Wt Readings from Last 1 Encounters:  10/29/15 123 lb 12.8 oz (56.155 kg)    Wt Readings from Last 10 Encounters:  10/29/15 123 lb 12.8 oz (56.155 kg)  10/19/15 117 lb 9 oz (53.326 kg)  10/09/15 132 lb (59.875 kg)  09/06/15 132 lb 4.8 oz (60.011 kg)  07/15/15 147 lb 8 oz (66.906 kg)  04/18/15 147 lb 3.2 oz (66.769 kg)     BMI:  Body mass index is 21.94 kg/(m^2).  Estimated Nutritional Needs:   Kcal:   BEE: 1280kcals, TEE; (IF 1.1-1.3)(AF 1.2) 1690-2000kcals  Protein:  44-56g protein (0.8-1.0g/kg)  Fluid:  1400-1618mL of fluid (25-41mL/kg)  EDUCATION NEEDS:   No education needs identified at this time   [place], RD, LDN Pager [phone]

## 2015-10-29 NOTE — Clinical Social Work Note (Signed)
CSW attempted to speak with patient this morning however patient was not able to carry on a conversation with CSW and kept falling asleep mid sentence. CSW will attempt to return when patient is more appropriate to conduct an assessment with. Shela Leff MSW,LCSW [phone]

## 2015-10-30 ENCOUNTER — Encounter: Payer: Self-pay | Admitting: Gastroenterology

## 2015-10-30 ENCOUNTER — Inpatient Hospital Stay: Payer: Self-pay

## 2015-10-30 DIAGNOSIS — S0101XA Laceration without foreign body of scalp, initial encounter: Secondary | ICD-10-CM | POA: Insufficient documentation

## 2015-10-30 DIAGNOSIS — R42 Dizziness and giddiness: Secondary | ICD-10-CM | POA: Insufficient documentation

## 2015-10-30 DIAGNOSIS — W19XXXA Unspecified fall, initial encounter: Secondary | ICD-10-CM | POA: Insufficient documentation

## 2015-10-30 LAB — BASIC METABOLIC PANEL
Anion gap: 10 (ref 5–15)
BUN: 23 mg/dL — ABNORMAL HIGH (ref 6–20)
CO2: 26 mmol/L (ref 22–32)
Calcium: 9 mg/dL (ref 8.9–10.3)
Chloride: 90 mmol/L — ABNORMAL LOW (ref 101–111)
Creatinine, Ser: 1.07 mg/dL (ref 0.61–1.24)
GFR calc Af Amer: >60 mL/min (ref >60)
GFR calc non Af Amer: >60 mL/min (ref >60)
Glucose, Bld: 93 mg/dL (ref 65–99)
Potassium: 3.7 mmol/L (ref 3.5–5.1)
Sodium: 126 mmol/L — ABNORMAL LOW (ref 135–145)

## 2015-10-30 LAB — CBC
HCT: 27.5 % — ABNORMAL LOW (ref 40.0–52.0)
Hemoglobin: 9.5 g/dL — ABNORMAL LOW (ref 13.0–18.0)
MCH: 34.1 pg — ABNORMAL HIGH (ref 26.0–34.0)
MCHC: 34.7 g/dL (ref 32.0–36.0)
MCV: 98.3 fL (ref 80.0–100.0)
Platelets: 143 10*3/uL — ABNORMAL LOW (ref 150–440)
RBC: 2.79 MIL/uL — ABNORMAL LOW (ref 4.40–5.90)
RDW: 18.2 % — ABNORMAL HIGH (ref 11.5–14.5)
WBC: 2.6 10*3/uL — ABNORMAL LOW (ref 3.8–10.6)

## 2015-10-30 MED ORDER — PEG 3350-KCL-NA BICARB-NACL 420 G PO SOLR
4000.0000 mL | Freq: Once | ORAL | Status: DC
  Filled 2015-10-30: qty 4000

## 2015-10-30 MED ORDER — BISACODYL 5 MG PO TBEC
10.0000 mg | DELAYED_RELEASE_TABLET | Freq: Once | ORAL | Status: AC
Start: 2015-10-31 — End: 2015-10-31
  Administered 2015-10-31: 10 mg via ORAL
  Filled 2015-10-30: qty 2

## 2015-10-30 MED ORDER — OLANZAPINE 2.5 MG PO TABS
5.0000 mg | ORAL_TABLET | Freq: Every day | ORAL | Status: DC
  Administered 2015-10-30 – 2015-11-17 (×19): 5 mg via ORAL
  Filled 2015-10-30 (×21): qty 2

## 2015-10-30 MED ORDER — SODIUM CHLORIDE 0.9 % IV SOLN
INTRAVENOUS | Status: DC
  Administered 2015-10-30 – 2015-11-02 (×4): via INTRAVENOUS

## 2015-10-30 NOTE — Consult Note (Signed)
  I discussed with Dr. Allen Norris, who is available to scope pt on Tues AM. Thus, will prep pt tomorrow instead. Thanks.

## 2015-10-30 NOTE — Progress Notes (Signed)
Telephone order from Dr. Jannifer Franklin to hold bedtime dose of Propranolol 40mg  due to HR 56. Pt remains on off-unit telemetry, will continue to assess.

## 2015-10-30 NOTE — Progress Notes (Signed)
[facility] [place] NAME: [name]    MR#:  [id number]  DATE OF BIRTH:  04-26-1961   CHIEF COMPLAINT: Patient is awake and alert, but answers yes or no or okay only. Patient is a poor historian. Okay with EGD in a.m.  SUBJECTIVE:  Patient is awake and alert.  EYES: No blurred or double vision.  EARS, NOSE, AND THROAT: No tinnitus or ear pain.  RESPIRATORY: No cough, shortness of breath, wheezing or hemoptysis.  CARDIOVASCULAR: No chest pain, orthopnea, edema.  GASTROINTESTINAL: No nausea, vomiting, diarrhea . GENITOURINARY: No dysuria, hematuria.  ENDOCRINE: No polyuria, nocturia,  HEMATOLOGY: No anemia, easy bruising or bleeding SKIN: No rash or lesion. MUSCULOSKELETAL: No joint pain or arthritis.  NEUROLOGIC: No tingling, numbness, weakness.  PSYCHIATRY: No anxiety or depression.  DRUG ALLERGIES:  No Known Allergies  VITALS:  Blood pressure 122/65, pulse 51, temperature 97.5 F (36.4 C), temperature source Oral, resp. rate 18, height 5\' 3"  (1.6 m), weight 56.473 kg (124 lb 8 oz), SpO2 100 %.  PHYSICAL EXAMINATION:  GENERAL:  54 y.o.-year-old patient lying in the bed with no acute distress.  EYES: Pupils equal, round, reactive to light and accommodation. No scleral icterus.  HEENT: Head atraumatic, normocephalic. Oropharynx and nasopharynx clear.  NECK:  Supple, no jugular venous distention. No thyroid enlargement, no tenderness.  LUNGS: Normal breath sounds bilaterally, no wheezing, rales,rhonchi or crepitation. No use of accessory muscles of respiration.  CARDIOVASCULAR: S1, S2 normal. No murmurs, rubs, or gallops.  ABDOMEN: Soft, nontender, nondistended. Bowel sounds present. No organomegaly or mass.  EXTREMITIES: No pedal edema, cyanosis, or clubbing.  NEUROLOGIC: Lethargic  PSYCHIATRIC: The patient is lethargic  SKIN: No obvious rash, lesion, or ulcer.    LABORATORY PANEL:   CBC  Recent Labs Lab  10/30/15 0518  WBC 2.6*  HGB 9.5*  HCT 27.5*  PLT 143*   ------------------------------------------------------------------------------------------------------------------  Chemistries   Recent Labs Lab 10/28/15 0503  10/30/15 0518  NA 127*  < > 126*  K 3.9  < > 3.7  CL 92*  < > 90*  CO2 27  < > 26  GLUCOSE 85  < > 93  BUN 23*  < > 23*  CREATININE 1.15  < > 1.07  CALCIUM 8.6*  < > 9.0  AST 20  --   --   ALT 20  --   --   ALKPHOS 59  --   --   BILITOT 0.7  --   --   < > = values in this interval not displayed. ------------------------------------------------------------------------------------------------------------------  Cardiac Enzymes  Recent Labs Lab 10/27/15 0703  TROPONINI <0.03   ------------------------------------------------------------------------------------------------------------------  RADIOLOGY:  No results found.  EKG:   Orders placed or performed during the hospital encounter of 10/27/15  . ED EKG within 10 minutes  . ED EKG within 10 minutes  . EKG 12-Lead  . EKG 12-Lead  . EKG 12-Lead  . EKG 12-Lead  . ED EKG  . ED EKG  . EKG 12-Lead  . EKG 12-Lead  . EKG 12-Lead  . EKG 12-Lead    ASSESSMENT AND PLAN:   1. Near syncope secondary to Symptomatic anemia with chronic abdominal pain Transfused 1 unit of blood and repeat hemoglobin at 8.3--9.5 today  Patient is agreeable for  EGD and colonoscopy as recommended by gastroenterology, which is scheduled for Tuesday a.m. by Dr. Allen Norris.   Social worker is consulted and addressed patient's financial concerns  Stool  for occult blood is negative in the ED. Will continue GI prophylaxis with Protonix  CT chest, abdomen and pelvis are negative except for lower rib fractures  #. Failure to thrive-unintentional weight loss of 30 pounds in 3 months CT head is negative for any metastases  CT chest, abdomen and pelvis -no malignancy  Patient is scheduled for EGD and colonoscopy on Tuesday a.m.   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 labs Mentating fine.   consulted nephrology    #Partially healed lower rib fractures-could be from malnourishment from failure to thrive Patient is reporting recent fall. Denies any abuse or trauma/ injury   #. Essential tremors-continue home medication propranolol  #. Hypertension-continue amlodipine and ACE inhibitor  #. History of seizures Resumed his home medication Tegretol and Depakote, but patient is with SIADH   neurology is consulted regarding alternative medication choices for his seizures    #PT is consulted for deconditioning -follow-up with their recommendations      All the records are reviewed and case discussed with Care Management/Social Workerr. Management plans discussed with the patient, family and they are in agreement.  CODE STATUS: Full code  TOTAL TIME TAKING CARE OF THIS PATIENT:35  minutes.   POSSIBLE D/C IN 2-3DAYS, DEPENDING ON CLINICAL CONDITION.   Nicholes Mango M.D on 10/30/2015 at 2:20 PM  Between 7am to 6pm - Pager - [phone] After 6pm go to [email or web address] - password [email or web address] [facility]  Office  [phone]  CC: Primary care physician; [name], MD

## 2015-10-30 NOTE — Progress Notes (Signed)
PT Cancellation Note  Patient Details Name: [name] MRN: [id number] DOB: 02-20-1961   Cancelled Treatment:    Reason Eval/Treat Not Completed: Fatigue/lethargy limiting ability to participate. Patient oriented to person upon PT entering. Was able to provide some information with subjective questioning, such as living alone in [place]. Patient unable to follow commands to perform bed mobility/strength testing due to lethargy. Will attempt evaluation at later time.   Dorice Lamas, PT, DPT 10/30/2015, 12:05 PM

## 2015-10-30 NOTE — Consult Note (Signed)
  GI Inpatient Follow-up Note  Patient Identification: [name] is a 54 y.o. male with significant wt loss, anemia, and SIADH. Seen initially by Dr. Allen Norris on 11/10. Recommended to have EGD/colonoscopy. Unfortunately, pt refused due to possible cost. Now, pt has changed his mind. Thus, I was asked to see pt. Hgb better since admission.  Subjective:  Scheduled Inpatient Medications:  . amLODipine  5 mg Oral Daily  . carbamazepine  200 mg Oral Q1200  . carbamazepine  400 mg Oral QAC breakfast  . carbamazepine  400 mg Oral QHS  . divalproex  1,000 mg Oral QAC breakfast  . divalproex  1,000 mg Oral QHS  . divalproex  500 mg Oral Q1200  . docusate sodium  100 mg Oral BID  . feeding supplement (ENSURE ENLIVE)  1 Bottle Oral BID BM  . lisinopril  20 mg Oral Daily  . pantoprazole  40 mg Oral Daily  . propranolol  40 mg Oral BID  . sodium chloride  3 mL Intravenous Q12H    Continuous Inpatient Infusions:     PRN Inpatient Medications:  acetaminophen **OR** acetaminophen, butalbital-acetaminophen-caffeine, ondansetron **OR** ondansetron (ZOFRAN) IV  Review of Systems: Constitutional: Weight is stable.  Eyes: No changes in vision. ENT: No oral lesions, sore throat.  GI: see HPI.  Heme/Lymph: No easy bruising.  CV: No chest pain.  GU: No hematuria.  Integumentary: No rashes.  Neuro: No headaches.  Psych: No depression/anxiety.  Endocrine: No heat/cold intolerance.  Allergic/Immunologic: No urticaria.  Resp: No cough, SOB.  Musculoskeletal: No joint swelling.    Physical Examination: BP 122/65 mmHg  Pulse 51  Temp(Src) 97.5 F (36.4 C) (Oral)  Resp 18  Ht 5\' 3"  (1.6 m)  Wt 56.473 kg (124 lb 8 oz)  BMI 22.06 kg/m2  SpO2 100% Gen: NAD, alert and oriented x 4 HEENT: PEERLA, EOMI, Neck: supple, no JVD or thyromegaly Chest: CTA bilaterally, no wheezes, crackles, or other adventitious sounds CV: RRR, no m/g/c/r Abd: soft, NT, ND, +BS in all four quadrants; no HSM, guarding,  ridigity, or rebound tenderness Ext: no edema, well perfused with 2+ pulses, Skin: no rash or lesions noted Lymph: no LAD  Data: Lab Results  Component Value Date   WBC 2.6* 10/30/2015   HGB 9.5* 10/30/2015   HCT 27.5* 10/30/2015   MCV 98.3 10/30/2015   PLT 143* 10/30/2015    Recent Labs Lab 10/28/15 0503 10/29/15 0525 10/30/15 0518  HGB 8.3* 8.9* 9.5*   Lab Results  Component Value Date   NA 126* 10/30/2015   K 3.7 10/30/2015   CL 90* 10/30/2015   CO2 26 10/30/2015   BUN 23* 10/30/2015   CREATININE 1.07 10/30/2015   Lab Results  Component Value Date   ALT 20 10/28/2015   AST 20 10/28/2015   ALKPHOS 59 10/28/2015   BILITOT 0.7 10/28/2015   No results for input(s): APTT, INR, PTT in the last 168 hours. Assessment/Plan: Mr. Saballos is a 53 y.o. male with wt loss and chronic anemia.  Recommendations: Will make arrangement for EGD/colonoscopy tomorrow after bowel prep with Dr Allen Norris. If he is not available, then I will try to do it, instead. Thanks. Please call with questions or concerns.  [name], Devin Dawn, MD

## 2015-10-30 NOTE — Consult Note (Signed)
  Pt seen in R,No [place]. S Pt is a 54 yr old white male, who is a poor historian and did not talk at all. Staff reports that he lives by himself and lost 30 lbs in a period of few wks which makes him "failure to thrive." O Pt was seen in his room laying down in bed in a peculiar position. He looks but does not have a good eye contact and does not answer any of the questions. No agitation.  Does not appear to be responding to internal; stimuli. Does not appear to be eating any food at all as all the food appears in packages that are not opened. I/J poor.  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 ACSW is to contact family and neighbors to find out further details about pt so that help can be given. Pt does need help with ADL's  At this time as he is not able to care for self. Re-eval By psychiatry after further information is obted. Start pt on a low dose of Zyprexa to help with depression and appetite.

## 2015-10-30 NOTE — Progress Notes (Signed)
Pt bed alarm went off at shift change, several staff responded, Pt found on floor. Pt had head lacerations x3. Pressure was held to stop the bleeding and covered with guaze and paper tape. Vitals were taken, pt answered simple yes, no questions, pupils were equal and reactive. MD notified, CT ordered, surgeon on call notified of head lacerations. Director of 1C and nursing supervisor notified. Post fall assessment completed. Occurrence reported in the safety zone portal.

## 2015-10-30 NOTE — Clinical Social Work Note (Signed)
CSW attempted for the third day to see patient and patient is much more alert today but only able to answer "yes or yeah" to any questions asked. He could not tell me his name or where he was or the month. CSW has requested of patient's nurse as well as attending MD to contact psychiatry to assess for decision making capacity in regards to discharge planning. If patient does not have capacity then DSS guardianship may need to be pursued. Shela Leff MSW,LCSW [phone]

## 2015-10-30 NOTE — H&P (Signed)
[name] is an 54 y.o. male.   Chief Complaint: Syncope HPI: 54 yr old with multiple medical issues and recently admitted for weakness, near syncopal episode, abdominal pain and anemia.  He is being managed by the medicine services with GI consult, scopes scheduled for Tuesday.  He had a fall today around 7pm hitting his head on the IV pole and potentially the bedside table.  He has a flat affect at baseline and only answered "yes" and "no".  He has just returned from a CT scan of the head and Surgery was called by Dr. Bridgett Larsson of medicine to assess 3 lacerations, one to frontal scalp and 2 to left temporal region.   Past Medical History  Diagnosis Date  . Hypertension   . Seizures ([place])   . Kidney disease   . Anemia   . Migraine   . Hyponatremia     chronic    Past Surgical History  Procedure Laterality Date  . None      Family History  Problem Relation Age of Onset  . Heart attack Father   . Kidney disease Father    Social History:  reports that he has never smoked. He does not have any smokeless tobacco history on file. He reports that he does not drink alcohol or use illicit drugs.  Allergies: No Known Allergies  Medications Prior to Admission  Medication Sig Dispense Refill  . amLODipine (NORVASC) 5 MG tablet Take 5 mg by mouth daily.    . butalbital-acetaminophen-caffeine (FIORICET, ESGIC) 50-325-40 MG per tablet Take 1-2 tablets by mouth every 4 (four) hours as needed for migraine.    . carbamazepine (TEGRETOL) 200 MG tablet Take 200-400 mg by mouth 3 (three) times daily. Pt takes two in the morning, one at noon, and two at bedtime.    . divalproex (DEPAKOTE) 500 MG DR tablet Take 1-2 tablets (500-1,000 mg total) by mouth 3 (three) times daily. Pt takes two in the morning, one at noon, and two at bedtime. 150 tablet 0  . feeding supplement, ENSURE ENLIVE, (ENSURE ENLIVE) LIQD Take 237 mLs by mouth 2 (two) times daily between meals. 10 Bottle 12  . lisinopril  (PRINIVIL,ZESTRIL) 20 MG tablet Take 20 mg by mouth daily.    . pantoprazole (PROTONIX) 40 MG tablet Take 1 tablet (40 mg total) by mouth daily. 30 tablet 2  . propranolol (INDERAL) 20 MG tablet Take 40 mg by mouth 2 (two) times daily.      Results for orders placed or performed during the hospital encounter of 10/27/15 (from the past 48 hour(s))  CBC     Status: Abnormal   Collection Time: 10/29/15  5:25 AM  Result Value Ref Range   WBC 3.1 (L) 3.8 - 10.6 K/uL   RBC 2.58 (L) 4.40 - 5.90 MIL/uL   Hemoglobin 8.9 (L) 13.0 - 18.0 g/dL   HCT 25.0 (L) 40.0 - 52.0 %   MCV 97.1 80.0 - 100.0 fL   MCH 34.6 (H) 26.0 - 34.0 pg   MCHC 35.7 32.0 - 36.0 g/dL   RDW 18.5 (H) 11.5 - 14.5 %   Platelets 137 (L) 150 - 440 K/uL  Basic metabolic panel     Status: Abnormal   Collection Time: 10/29/15  5:25 AM  Result Value Ref Range   Sodium 127 (L) 135 - 145 mmol/L   Potassium 4.3 3.5 - 5.1 mmol/L   Chloride 92 (L) 101 - 111 mmol/L   CO2 28 22 - 32 mmol/L  Glucose, Bld 92 65 - 99 mg/dL   BUN 23 (H) 6 - 20 mg/dL   Creatinine, Ser 1.12 0.61 - 1.24 mg/dL   Calcium 8.9 8.9 - 10.3 mg/dL   GFR calc non Af Amer >60 >60 mL/min   GFR calc Af Amer >60 >60 mL/min    Comment: (NOTE) The eGFR has been calculated using the CKD EPI equation. This calculation has not been validated in all clinical situations. eGFR's persistently <60 mL/min signify possible Chronic Kidney Disease.    Anion gap 7 5 - 15  Valproic acid level     Status: Abnormal   Collection Time: 10/29/15  5:25 AM  Result Value Ref Range   Valproic Acid Lvl 115 (H) 50.0 - 100.0 ug/mL  Sodium     Status: Abnormal   Collection Time: 10/29/15  8:59 AM  Result Value Ref Range   Sodium 125 (L) 135 - 145 mmol/L  Sodium     Status: Abnormal   Collection Time: 10/29/15  4:26 PM  Result Value Ref Range   Sodium 126 (L) 135 - 145 mmol/L  Basic metabolic panel     Status: Abnormal   Collection Time: 10/30/15  5:18 AM  Result Value Ref Range    Sodium 126 (L) 135 - 145 mmol/L   Potassium 3.7 3.5 - 5.1 mmol/L   Chloride 90 (L) 101 - 111 mmol/L   CO2 26 22 - 32 mmol/L   Glucose, Bld 93 65 - 99 mg/dL   BUN 23 (H) 6 - 20 mg/dL   Creatinine, Ser 1.07 0.61 - 1.24 mg/dL   Calcium 9.0 8.9 - 10.3 mg/dL   GFR calc non Af Amer >60 >60 mL/min   GFR calc Af Amer >60 >60 mL/min    Comment: (NOTE) The eGFR has been calculated using the CKD EPI equation. This calculation has not been validated in all clinical situations. eGFR's persistently <60 mL/min signify possible Chronic Kidney Disease.    Anion gap 10 5 - 15  CBC     Status: Abnormal   Collection Time: 10/30/15  5:18 AM  Result Value Ref Range   WBC 2.6 (L) 3.8 - 10.6 K/uL   RBC 2.79 (L) 4.40 - 5.90 MIL/uL   Hemoglobin 9.5 (L) 13.0 - 18.0 g/dL   HCT 27.5 (L) 40.0 - 52.0 %   MCV 98.3 80.0 - 100.0 fL   MCH 34.1 (H) 26.0 - 34.0 pg   MCHC 34.7 32.0 - 36.0 g/dL   RDW 18.2 (H) 11.5 - 14.5 %   Platelets 143 (L) 150 - 440 K/uL   Ct Head Wo Contrast  10/30/2015  CLINICAL DATA:  [place] from bed tonight with laceration frontal region. EXAM: CT HEAD WITHOUT CONTRAST TECHNIQUE: Contiguous axial images were obtained from the base of the skull through the vertex without intravenous contrast. COMPARISON:  10/27/2015, 10/09/2015 and 07/18/2010 FINDINGS: Ventricles are mildly prominent for patient age but unchanged. Cisterns and remaining CSF spaces are within normal. There is no mass, mass effect, shift of midline structures or acute hemorrhage. No evidence of acute infarction. Small midline frontal scalp contusion. No fracture. IMPRESSION: No acute intracranial findings. Mild stable atrophic change. Small midline frontal scalp contusion.  No fracture. Electronically Signed   By: Marin Olp M.D.   On: 10/30/2015 20:35    Review of Systems  Constitutional: Positive for weight loss and malaise/fatigue. Negative for fever and chills.  HENT: Positive for hearing loss.   Respiratory: Negative for  shortness of  breath.   Cardiovascular: Negative for chest pain and leg swelling.  Gastrointestinal: Positive for abdominal pain and blood in stool. Negative for nausea and vomiting.  Neurological: Positive for dizziness, weakness and headaches. Negative for focal weakness, seizures and loss of consciousness.  All other systems reviewed and are negative.   Blood pressure 122/68, pulse 56, temperature 98.3 F (36.8 C), temperature source Oral, resp. rate 19, height _0  (1.6 m), weight 124 lb 8 oz (56.473 kg), SpO2 100 %. Physical Exam  Vitals reviewed. Constitutional: He is oriented to person, place, and time. He appears well-developed and well-nourished.  HENT:  Right Ear: External ear normal.  Left Ear: External ear normal.  Nose: Nose normal.  Mouth/Throat: Oropharynx is clear and moist. No oropharyngeal exudate.  1cm frontal scalp lac in a downward Y image, superficial   Two small 2cm lacerations on left temporal region, the more anterior showing beneath the deep dermis, and the posterior laceration superficial not through deep dermal layers  Eyes: Conjunctivae and EOM are normal. Pupils are equal, round, and reactive to light. No scleral icterus.  Neck: Normal range of motion. Neck supple. No tracheal deviation present.  Cardiovascular: Normal rate, regular rhythm, normal heart sounds and intact distal pulses.  Exam reveals no gallop and no friction rub.   No murmur heard. Respiratory: Effort normal and breath sounds normal. No respiratory distress. He has no wheezes. He has no rales.  GI: Soft. Bowel sounds are normal. He exhibits no distension. There is no tenderness.  Musculoskeletal: Normal range of motion. He exhibits no edema or tenderness.  Neurological: He is alert and oriented to person, place, and time. No cranial nerve deficit.  Skin: Skin is warm and dry. No rash noted. No erythema. No pallor.  Psychiatric: He has a normal mood and affect. His behavior is normal.  Judgment and thought content normal.     Assessment/Plan 54yrold male with multiple medical issues, sustained fall tonight with lacerations to the head.  All lacerations were very small.  I personally reviewed his records, laboratory values and CT scan images showing no evidence of fracture beneath the areas, or injury to brain.  I also reviewed the radiology read as above.  Given his confused state at baseline and inability to get family to consent, able to close the areas with steri strips.  Cleaned the area off and placed benzoin around the laceration sites, steri strips used to reapproximate skin edges.  Patient tolerated well.  Keep steri strips in place until they fall off on their own.    CLavona MoundLoflin 10/30/2015, 9:11 PM

## 2015-10-30 NOTE — Plan of Care (Addendum)
No c/o pain, resting comfortably in bed. VSS, afebrile. Unable to form complete sentences. Flat affect, anxious at times w/ impulsive behavior to get out of bed. Emotional support given.   Problem: Safety: Goal: Ability to remain free from injury will improve Outcome: Progressing High fall risk. Bed alarm on, hourly rounding. Safe environment provided. +1 assist to [facility] w/ intermittent incontinence.   Problem: Tissue Perfusion: Goal: Risk factors for ineffective tissue perfusion will decrease Outcome: Progressing Pt refuses to wear SCD's wearing own panty hose.   Problem: Fluid Volume: Goal: Ability to maintain a balanced intake and output will improve Outcome: Progressing Hypotensive at bedtime. Encouraged PO fluids intake. No s/s bleeding noted, Hgb 8.9. Occult stool pending due to no BM this shift   Problem: Nutrition: Goal: Adequate nutrition will be maintained Outcome: Progressing Poor intake, requiring assist to feed at this time. RD seeing pt daily.

## 2015-10-30 NOTE — Progress Notes (Signed)
Pt alert but lethargic at times, only response pt will make is "yea" to any question that is asked. Pt will take his medication. Psych consulted today to determine decision making capacity. Orders from GI are in for an EGD and colonoscopy to be performed on 10/14. Consent form is on the front of the chart and will be signed by physicians x2 due to pt not being able to sign due to mental status.

## 2015-10-31 ENCOUNTER — Inpatient Hospital Stay: Payer: Self-pay

## 2015-10-31 DIAGNOSIS — R410 Disorientation, unspecified: Secondary | ICD-10-CM

## 2015-10-31 DIAGNOSIS — R569 Unspecified convulsions: Secondary | ICD-10-CM

## 2015-10-31 DIAGNOSIS — N189 Chronic kidney disease, unspecified: Secondary | ICD-10-CM

## 2015-10-31 DIAGNOSIS — W19XXXA Unspecified fall, initial encounter: Secondary | ICD-10-CM

## 2015-10-31 LAB — BASIC METABOLIC PANEL
ANION GAP: 8 (ref 5–15)
BUN: 25 mg/dL — ABNORMAL HIGH (ref 6–20)
CALCIUM: 8.6 mg/dL — AB (ref 8.9–10.3)
CO2: 27 mmol/L (ref 22–32)
CREATININE: 1.04 mg/dL (ref 0.61–1.24)
Chloride: 90 mmol/L — ABNORMAL LOW (ref 101–111)
GFR calc non Af Amer: >60 mL/min (ref >60)
GLUCOSE: 108 mg/dL — AB (ref 65–99)
POTASSIUM: 4.1 mmol/L (ref 3.5–5.1)
SODIUM: 125 mmol/L — AB (ref 135–145)

## 2015-10-31 LAB — BLOOD GAS, ARTERIAL
ACID-BASE EXCESS: 3.8 mmol/L — AB (ref 0.0–3.0)
Bicarbonate: 27.7 mEq/L (ref 21.0–28.0)
FIO2: 21
O2 SAT: 97.8 %
PCO2 ART: 38 mmHg (ref 32.0–48.0)
PH ART: 7.47 — AB (ref 7.350–7.450)
PO2 ART: 94 mmHg (ref 83.0–108.0)
Patient temperature: 37

## 2015-10-31 LAB — URIC ACID: Uric Acid, Serum: 4.3 mg/dL — ABNORMAL LOW (ref 4.4–7.6)

## 2015-10-31 LAB — CBC
HCT: 29.2 % — ABNORMAL LOW (ref 40.0–52.0)
HEMOGLOBIN: 10.2 g/dL — AB (ref 13.0–18.0)
MCH: 34.5 pg — AB (ref 26.0–34.0)
MCHC: 34.9 g/dL (ref 32.0–36.0)
MCV: 98.9 fL (ref 80.0–100.0)
PLATELETS: 149 10*3/uL — AB (ref 150–440)
RBC: 2.95 MIL/uL — ABNORMAL LOW (ref 4.40–5.90)
RDW: 18.4 % — ABNORMAL HIGH (ref 11.5–14.5)
WBC: 3.8 10*3/uL (ref 3.8–10.6)

## 2015-10-31 LAB — TSH: TSH: 4.121 u[IU]/mL (ref 0.350–4.500)

## 2015-10-31 LAB — OSMOLALITY: OSMOLALITY: 259 mosm/kg — AB (ref 275–295)

## 2015-10-31 LAB — CORTISOL: CORTISOL PLASMA: 26.7 ug/dL

## 2015-10-31 NOTE — Progress Notes (Signed)
Nurse called again, as pt was drowsy.  Seen pt again around 4 pm.  He is opening eyes to stimuli, but not following commands.  floppy limbs.  Vitals stable.  Assessment   * encephalopathy- likely metabolic   - Got CT head and ABG stat   Negative.    Likely reason- is hyponatremia, anemia, and failure to thrive.    COntinue to monitor.    IV fluids.    Check labs tomorrow am.    Will call palliative care consult to help decide goals of therapy.  Additional time spent 30 min.

## 2015-10-31 NOTE — Consult Note (Signed)
CC: confusion  HPI: [name] is an 54 y.o. male  with a known history of essential hypertension, seizures and chronic kidney disease is presenting to the ED with a chief complaint of chronic abdominal pain and generalized weakness associated with lightheadedness for the past 5 days.   Past Medical History  Diagnosis Date  . Hypertension   . Seizures ([place])   . Kidney disease   . Anemia   . Migraine   . Hyponatremia     chronic    Past Surgical History  Procedure Laterality Date  . None      Family History  Problem Relation Age of Onset  . Heart attack Father   . Kidney disease Father     Social History:  reports that he has never smoked. He does not have any smokeless tobacco history on file. He reports that he does not drink alcohol or use illicit drugs.  No Known Allergies  Medications: I have reviewed the patient's current medications.  ROS: Unable to obtain due to confusion   Physical Examination: Blood pressure 136/74, pulse 60, temperature 97.9 F (36.6 C), temperature source Oral, resp. rate 16, height 5\' 3"  (1.6 m), weight 124 lb 9.6 oz (56.518 kg), SpO2 100 %.   Neurological Examination Mental Status: Disoriented, tells me his name Cranial Nerves: II: Discs flat bilaterally; Visual fields grossly normal, pupils equal, round, reactive to light and accommodation III,IV, VI: ptosis not present, extra-ocular motions intact bilaterally V,VII: smile symmetric, facial light touch sensation normal bilaterally VIII: hearing normal bilaterally IX,X: gag reflex present XI: bilateral shoulder shrug XII: midline tongue extension Motor: Right : Upper extremity   4/5    Left:     Upper extremity   4/5  Lower extremity   4/5     Lower extremity   4/5 Tone and bulk:normal tone throughout; no atrophy noted Sensory: Pinprick and light touch intact throughout, bilaterally Deep Tendon Reflexes: 2+ and symmetric throughout Plantars: Right: downgoing   Left:  downgoing Cerebellar: normal finger-to-nose, normal rapid alternating movements and normal heel-to-shin test Gait: not tested      Laboratory Studies:   Basic Metabolic Panel:  Recent Labs Lab 10/27/15 0703 10/28/15 0503 10/29/15 0525 10/29/15 0859 10/29/15 1626 10/30/15 0518 10/31/15 0455  NA 130* 127* 127* 125* 126* 126* 125*  K 3.5 3.9 4.3  --   --  3.7 4.1  CL 95* 92* 92*  --   --  90* 90*  CO2 27 27 28   --   --  26 27  GLUCOSE 92 85 92  --   --  93 108*  BUN 23* 23* 23*  --   --  23* 25*  CREATININE 1.19 1.15 1.12  --   --  1.07 1.04  CALCIUM 9.0 8.6* 8.9  --   --  9.0 8.6*    Liver Function Tests:  Recent Labs Lab 10/28/15 0503  AST 20  ALT 20  ALKPHOS 59  BILITOT 0.7  PROT 5.2*  ALBUMIN 2.8*   No results for input(s): LIPASE, AMYLASE in the last 168 hours. No results for input(s): AMMONIA in the last 168 hours.  CBC:  Recent Labs Lab 10/25/15 0738 10/27/15 0703 10/27/15 1758 10/28/15 0503 10/29/15 0525 10/30/15 0518 10/31/15 0455  WBC 2.3* 1.8*  --  2.5* 3.1* 2.6* 3.8  NEUTROABS 1.2*  --   --   --   --   --   --   HGB 7.4* 7.3* 8.7* 8.3* 8.9*  9.5* 10.2*  HCT 21.9* 21.7*  --  23.7* 25.0* 27.5* 29.2*  MCV 98.2 99.7  --  97.3 97.1 98.3 98.9  PLT 168 158  --  135* 137* 143* 149*    Cardiac Enzymes:  Recent Labs Lab 10/27/15 0703  TROPONINI <0.03    BNP: Invalid input(s): POCBNP  CBG:  Recent Labs Lab 10/28/15 1557 10/28/15 1626  GLUCAP 87 107*    Microbiology: Results for orders placed or performed in visit on 11/26/13  Urine culture     Status: None   Collection Time: 11/27/13  5:05 AM  Result Value Ref Range Status   Micro Text Report   Final       SOURCE: CLEAN CATCH    COMMENT                   MIXED BACTERIAL ORGANISMS   COMMENT                   RESULTS SUGGESTIVE OF CONTAMINATION   ANTIBIOTIC                                                      Clostridium Difficile [facility])     Status: None   Collection Time:  11/29/13  9:51 AM  Result Value Ref Range Status   Micro Text Report   Final       C.DIFFICILE ANTIGEN       C.DIFFICILE GDH ANTIGEN : NEGATIVE   C.DIFFICILE TOXIN A/B     C.DIFFICILE TOXINS A AND B : NEGATIVE   INTERPRETATION            Negative for C. difficile.    ANTIBIOTIC                                                        Coagulation Studies: No results for input(s): LABPROT, INR in the last 72 hours.  Urinalysis:  Recent Labs Lab 10/27/15 0842  COLORURINE YELLOW*  LABSPEC 1.014  PHURINE 6.0  GLUCOSEU NEGATIVE  HGBUR NEGATIVE  BILIRUBINUR NEGATIVE  KETONESUR TRACE*  PROTEINUR NEGATIVE  NITRITE NEGATIVE  LEUKOCYTESUR NEGATIVE    Lipid Panel:     Component Value Date/Time   CHOL 196 11/27/2013 0512   TRIG 219* 11/27/2013 0512   HDL 29* 11/27/2013 0512   VLDL 44* 11/27/2013 0512   LDLCALC 123* 11/27/2013 0512    HgbA1C:  Lab Results  Component Value Date   HGBA1C 5.3 10/28/2015    Urine Drug Screen:     Component Value Date/Time   LABOPIA NONE DETECTED 10/19/2015 1345   LABBENZ NONE DETECTED 10/19/2015 1345   AMPHETMU NONE DETECTED 10/19/2015 1345   THCU NONE DETECTED 10/19/2015 1345   LABBARB NONE DETECTED 10/19/2015 1345    Alcohol Level: No results for input(s): ETH in the last 168 hours.  Imaging: Ct Head Wo Contrast  10/30/2015  CLINICAL DATA:  [place] from bed tonight with laceration frontal region. EXAM: CT HEAD WITHOUT CONTRAST TECHNIQUE: Contiguous axial images were obtained from the base of the skull through the vertex without intravenous contrast. COMPARISON:  10/27/2015, 10/09/2015 and 07/18/2010 FINDINGS: Ventricles are mildly prominent  for patient age but unchanged. Cisterns and remaining CSF spaces are within normal. There is no mass, mass effect, shift of midline structures or acute hemorrhage. No evidence of acute infarction. Small midline frontal scalp contusion. No fracture. IMPRESSION: No acute intracranial findings. Mild stable  atrophic change. Small midline frontal scalp contusion.  No fracture. Electronically Signed   By: Marin Olp M.D.   On: 10/30/2015 20:35     Assessment/Plan:  54 y.o. male  with a known history of essential hypertension, seizures and chronic kidney disease is presenting to the ED with a chief complaint of chronic abdominal pain and generalized weakness associated with lightheadedness for the past 5 days prior to admission  Pt's current mental status is likely metabolic in nature in setting of anemia, hyponatremia, failure to thrive as well as delirium  CTH reviewed which shows generalized atrophy I don't think he needs any further imaging Leotis Pain    10/31/2015, 5:48 PM

## 2015-10-31 NOTE — Plan of Care (Signed)
Problem: Safety: Goal: Ability to remain free from injury will improve Outcome: Progressing Pt fell out of bed at shift change last night resulting in 2 lacerations on left head w/ contusion and 1 laceration on forehead. CT showed "No acute intracranial findings. Mild stable atrophic change. Small midline frontal scalp contusion. No fracture." High fall risk. Bed alarm on. Safety sitter at bedside. Safe environment provided.          Problem: Fluid Volume: Goal: Ability to maintain a balanced intake and output will improve Outcome: Progressing 1275ml PO fluid restriction. NS IVF infusing as ordered due to hyponatremia (Na 125). Nephrology consulted.      Problem: Nutrition: Goal: Adequate nutrition will be maintained Outcome: Progressing Good po intake for breakfast and lunch.

## 2015-10-31 NOTE — Progress Notes (Signed)
[facility]  ROUNDING NOTE   Subjective:  Patient readmitted for abdominal pain, poor by mouth intake, lightheadedness, and dizziness. Patient sustained a fall last night and had several lacerations to his forehead. Patient currently only answering yes and no to questions. This is quite different from his normal baseline from the office. Patient now has hyponatremia with serum sodium of 125.  Objective:  Vital signs in last 24 hours:  Temp:  [97.8 F (36.6 C)-98.7 F (37.1 C)] 98.7 F (37.1 C) (11/14 1125) Pulse Rate:  [55-67] 59 (11/14 1125) Resp:  [16-20] 18 (11/14 1125) BP: (95-152)/(57-81) 116/78 mmHg (11/14 1125) SpO2:  [98 %-100 %] 100 % (11/14 1125) Weight:  [56.518 kg (124 lb 9.6 oz)] 56.518 kg (124 lb 9.6 oz) (11/14 0500)  Weight change: 0.045 kg (1.6 oz) Filed Weights   10/29/15 0509 10/30/15 0500 10/31/15 0500  Weight: 56.155 kg (123 lb 12.8 oz) 56.473 kg (124 lb 8 oz) 56.518 kg (124 lb 9.6 oz)    Intake/Output: I/O last 3 completed shifts: In: 14 [P.O.:760] Out: 450 [Urine:450]   Intake/Output this shift:  Total I/O In: -  Out: 150 [Urine:150]  Physical Exam: General: Appears confused  Head: Laceration on forehead covered with dressing. Dry oral mucosal membranes  Eyes: Anicteric  Neck: Supple, trachea midline  Lungs:  Clear to auscultation  Heart: Regular rate and rhythm, no rubs  Abdomen:  Soft, nontender, BS present  Extremities:  trace peripheral edema.  Neurologic: Not following commands, answering yes/no to questions  Skin: Forehead laceration       Basic Metabolic Panel:  Recent Labs Lab 10/27/15 0703 10/28/15 0503 10/29/15 0525 10/29/15 0859 10/29/15 1626 10/30/15 0518 10/31/15 0455  NA 130* 127* 127* 125* 126* 126* 125*  K 3.5 3.9 4.3  --   --  3.7 4.1  CL 95* 92* 92*  --   --  90* 90*  CO2 27 27 28   --   --  26 27  GLUCOSE 92 85 92  --   --  93 108*  BUN 23* 23* 23*  --   --  23* 25*  CREATININE 1.19 1.15 1.12   --   --  1.07 1.04  CALCIUM 9.0 8.6* 8.9  --   --  9.0 8.6*    Liver Function Tests:  Recent Labs Lab 10/28/15 0503  AST 20  ALT 20  ALKPHOS 59  BILITOT 0.7  PROT 5.2*  ALBUMIN 2.8*   No results for input(s): LIPASE, AMYLASE in the last 168 hours. No results for input(s): AMMONIA in the last 168 hours.  CBC:  Recent Labs Lab 10/25/15 0738 10/27/15 0703 10/27/15 1758 10/28/15 0503 10/29/15 0525 10/30/15 0518 10/31/15 0455  WBC 2.3* 1.8*  --  2.5* 3.1* 2.6* 3.8  NEUTROABS 1.2*  --   --   --   --   --   --   HGB 7.4* 7.3* 8.7* 8.3* 8.9* 9.5* 10.2*  HCT 21.9* 21.7*  --  23.7* 25.0* 27.5* 29.2*  MCV 98.2 99.7  --  97.3 97.1 98.3 98.9  PLT 168 158  --  135* 137* 143* 149*    Cardiac Enzymes:  Recent Labs Lab 10/27/15 0703  TROPONINI <0.03    BNP: Invalid input(s): POCBNP  CBG:  Recent Labs Lab 10/28/15 1557 10/28/15 1626  GLUCAP 87 107*    Microbiology: Results for orders placed or performed in visit on 11/26/13  Urine culture     Status: None  Collection Time: 11/27/13  5:05 AM  Result Value Ref Range Status   Micro Text Report   Final       SOURCE: CLEAN CATCH    COMMENT                   MIXED BACTERIAL ORGANISMS   COMMENT                   RESULTS SUGGESTIVE OF CONTAMINATION   ANTIBIOTIC                                                      Clostridium Difficile [facility])     Status: None   Collection Time: 11/29/13  9:51 AM  Result Value Ref Range Status   Micro Text Report   Final       C.DIFFICILE ANTIGEN       C.DIFFICILE GDH ANTIGEN : NEGATIVE   C.DIFFICILE TOXIN A/B     C.DIFFICILE TOXINS A AND B : NEGATIVE   INTERPRETATION            Negative for C. difficile.    ANTIBIOTIC                                                        Coagulation Studies: No results for input(s): LABPROT, INR in the last 72 hours.  Urinalysis: No results for input(s): COLORURINE, LABSPEC, PHURINE, GLUCOSEU, HGBUR, BILIRUBINUR, KETONESUR, PROTEINUR,  UROBILINOGEN, NITRITE, LEUKOCYTESUR in the last 72 hours.  Invalid input(s): APPERANCEUR    Imaging: Ct Head Wo Contrast  10/30/2015  CLINICAL DATA:  [place] [address] from bed tonight with laceration frontal region. EXAM: CT HEAD WITHOUT CONTRAST TECHNIQUE: Contiguous axial images were obtained from the base of the skull through the vertex without intravenous contrast. COMPARISON:  10/27/2015, 10/09/2015 and 07/18/2010 FINDINGS: Ventricles are mildly prominent for patient age but unchanged. Cisterns and remaining CSF spaces are within normal. There is no mass, mass effect, shift of midline structures or acute hemorrhage. No evidence of acute infarction. Small midline frontal scalp contusion. No fracture. IMPRESSION: No acute intracranial findings. Mild stable atrophic change. Small midline frontal scalp contusion.  No fracture. Electronically Signed   By: Marin Olp M.D.   On: 10/30/2015 20:35     Medications:   . sodium chloride 20 mL/hr at 10/30/15 1819   . amLODipine  5 mg Oral Daily  . carbamazepine  200 mg Oral Q1200  . carbamazepine  400 mg Oral QAC breakfast  . carbamazepine  400 mg Oral QHS  . divalproex  1,000 mg Oral QAC breakfast  . divalproex  1,000 mg Oral QHS  . divalproex  500 mg Oral Q1200  . docusate sodium  100 mg Oral BID  . feeding supplement (ENSURE ENLIVE)  1 Bottle Oral BID BM  . lisinopril  20 mg Oral Daily  . OLANZapine  5 mg Oral QHS  . pantoprazole  40 mg Oral Daily  . polyethylene glycol-electrolytes  4,000 mL Oral Once  . propranolol  40 mg Oral BID  . sodium chloride  3 mL Intravenous Q12H   acetaminophen **OR** acetaminophen, butalbital-acetaminophen-caffeine, ondansetron **OR** ondansetron (ZOFRAN) IV  Assessment/ Plan:  54 y.o. male with hypertension, seizure disorder, chronic speech disorder, CKD stage II, anemia of CKD, SHPTH.  1.  Hyponatremia. 2.  CKD stage II 3.  Hypertension.  Plan:  Patient presented similarly previously. Serum sodium remains  low at 125. We will proceed with additional urine and serum testing. Patient sustained a fall with laceration to the forehead last night. This is caused significant change in mental status. May need to consult both psychiatry and neurology however defer this to the hospitalist. We will increase 0.9 normal saline to 75 cc per hour. Follow serum sodium closely. Further plan as patient progresses.   LOS: 4 Remedy Corporan 11/14/20162:01 PM

## 2015-10-31 NOTE — Progress Notes (Signed)
Spoke with Dr. Anselm Jungling to make him aware that pt is less alert and responsive than he was earlier in the day.  When pt's name is called he opens his eyes but closes them right back.  Pt will not hold his head up.  Dr. Anselm Jungling  Put in order for head CT and he will come see pt.  Clarise Cruz, RN

## 2015-10-31 NOTE — Consult Note (Signed)
  Psychiatry: Follow-up repeat consult for this 54 year old man with "failure to thrive" anemia syncope multiple medical problems. I last saw the patient last week and at that time he did not appear to be acutely mentally ill. Since that time his mental status since changed. He appears to have had a fall. Nursing staff tells me that he has been unresponsive all day today.  Attempted to interview the patient. Patient was almost unarousable. With loud speaking of his name and gentle shaking he did open his eyes but never made eye contact with me. Did not vocalize at all. Didn't show any signs of actual interaction.  The consult this time was phrased as capacity. Clearly if the patient is totally unconscious and unarousable he has no capacity.  Current presentation does not look like depression or psychiatric illness but the end result of a medical illness. Would not recommend adding any psychiatric medicine at this point. I will try to follow-up tomorrow and during the week as needed.  Diagnosis delirium from medical problems.

## 2015-10-31 NOTE — Progress Notes (Signed)
[place] [facility] [place] NAME: [name]    MR#:  [id number]  DATE OF BIRTH:  01-Apr-1961   CHIEF COMPLAINT: Patient is awake and alert, but answers yes or no or okay only. Patient is a poor historian.  He is not oriented to the surroundings.  SUBJECTIVE:  Patient is awake and alert.   Not oriented, so not able to give any review of systems, he is not reliable due to mental status.   DRUG ALLERGIES:  No Known Allergies  VITALS:  Blood pressure 103/65, pulse 56, temperature 98.4 F (36.9 C), temperature source Oral, resp. rate 18, height 5\' 3"  (1.6 m), weight 56.518 kg (124 lb 9.6 oz), SpO2 100 %.  PHYSICAL EXAMINATION:  GENERAL:  54 y.o.-year-old patient lying in the bed with no acute distress.  EYES: Pupils equal, round, reactive to light and accommodation. No scleral icterus.  HEENT: Head have some dressings- after laceration last evening, normocephalic. Oropharynx and nasopharynx clear.  NECK:  Supple, no jugular venous distention. No thyroid enlargement, no tenderness.  LUNGS: Normal breath sounds bilaterally, no wheezing, rales,rhonchi or crepitation. No use of accessory muscles of respiration.  CARDIOVASCULAR: S1, S2 normal. No murmurs, rubs, or gallops.  ABDOMEN: Soft, nontender, nondistended. Bowel sounds present. No organomegaly or mass.  EXTREMITIES: No pedal edema, cyanosis, or clubbing.  NEUROLOGIC: alert, but not following commands. Moved limbs spontaneously. He is depressed and not oriented to surrounding. PSYCHIATRIC: The patient is depressed and disoriented.  SKIN: No obvious rash, lesion, or ulcer.    LABORATORY PANEL:   CBC  Recent Labs Lab 10/31/15 0455  WBC 3.8  HGB 10.2*  HCT 29.2*  PLT 149*   ------------------------------------------------------------------------------------------------------------------  Chemistries   Recent Labs Lab 10/28/15 0503  10/31/15 0455  NA 127*  < > 125*  K 3.9  <  > 4.1  CL 92*  < > 90*  CO2 27  < > 27  GLUCOSE 85  < > 108*  BUN 23*  < > 25*  CREATININE 1.15  < > 1.04  CALCIUM 8.6*  < > 8.6*  AST 20  --   --   ALT 20  --   --   ALKPHOS 59  --   --   BILITOT 0.7  --   --   < > = values in this interval not displayed. ------------------------------------------------------------------------------------------------------------------  Cardiac Enzymes  Recent Labs Lab 10/27/15 0703  TROPONINI <0.03   ------------------------------------------------------------------------------------------------------------------  RADIOLOGY:  Ct Head Wo Contrast  10/30/2015  CLINICAL DATA:  [place] from bed tonight with laceration frontal region. EXAM: CT HEAD WITHOUT CONTRAST TECHNIQUE: Contiguous axial images were obtained from the base of the skull through the vertex without intravenous contrast. COMPARISON:  10/27/2015, 10/09/2015 and 07/18/2010 FINDINGS: Ventricles are mildly prominent for patient age but unchanged. Cisterns and remaining CSF spaces are within normal. There is no mass, mass effect, shift of midline structures or acute hemorrhage. No evidence of acute infarction. Small midline frontal scalp contusion. No fracture. IMPRESSION: No acute intracranial findings. Mild stable atrophic change. Small midline frontal scalp contusion.  No fracture. Electronically Signed   By: Marin Olp M.D.   On: 10/30/2015 20:35    EKG:   Orders placed or performed during the hospital encounter of 10/27/15  . ED EKG within 10 minutes  . ED EKG within 10 minutes  . EKG 12-Lead  . EKG 12-Lead  . EKG 12-Lead  . EKG 12-Lead  . ED  EKG  . ED EKG  . EKG 12-Lead  . EKG 12-Lead  . EKG 12-Lead  . EKG 12-Lead    ASSESSMENT AND PLAN:   1. Near syncope secondary to Symptomatic anemia with chronic abdominal pain Transfused 1 unit of blood and repeat hemoglobin at 8.3--9.5  Patient is agreeable for  EGD and colonoscopy as recommended by gastroenterology, which is  scheduled for Tuesday a.m. by Dr. Allen Norris.   Social worker is consulted and addressed patient's financial concerns  Stool for occult blood is negative in the ED. Will continue GI prophylaxis with Protonix  CT chest, abdomen and pelvis are negative except for lower rib fractures,   He have weakness and frequent falls- had one in hospital 10/30/15 with lacerations on head.  #. Failure to thrive-unintentional weight loss of 30 pounds in 3 months CT head is negative for any metastases. CT chest, abdomen and pelvis -no malignancy  Patient is scheduled for EGD and colonoscopy on Tuesday a.m.  appreciate GI recommendations  TSH is normal If not clear source, then likely will be due to psych issues. May also consider palliative care for repeated admissions for similar reason.  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 Labs- Na still 125. Mentating fine.   consulted nephrology to help , if possible change seizure meds.  #Partially healed lower rib fractures-could be from malnourishment from failure to thrive Patient is reporting recent fall. Denies any abuse or trauma/ injury  #. Essential tremors-continue home medication propranolol  #. Hypertension-continue amlodipine and ACE inhibitor  #. History of seizures  Resumed his home medication Tegretol and Depakote, but patient is with SIADH   neurology is consulted regarding alternative medication choices for his seizures .   #PT is consulted for deconditioning -follow-up with their recommendations    All the records are reviewed and case discussed with Care Management/Social Workerr. Management plans discussed with the patient, family and they are in agreement.  CODE STATUS: Full code  TOTAL TIME TAKING CARE OF THIS PATIENT:35  minutes.   POSSIBLE D/C IN 2-3DAYS,  DEPENDING ON CLINICAL CONDITION.   Vaughan Basta M.D on 10/31/2015 at 10:50 AM  Between 7am to 6pm - Pager - [phone] After 6pm go to [email or web address] - password [email or web address] [facility]  Office  [phone]  CC: Primary care physician; [name], MD

## 2015-10-31 NOTE — Plan of Care (Addendum)
No c/o pain. VSS, afebrile. Will continue to assess.   Problem: Safety: Goal: Ability to remain free from injury will improve Outcome: Progressing Pt fell out of bed at shift change last night resulting in 2 lacerations on left head w/ contusion and 1 laceration on forehead. CT showed "No acute intracranial findings. Mild stable atrophic change. Small midline frontal scalp contusion. No fracture." High fall risk. Bed alarm on. Safety sitter at bedside. Safe environment provided.    Problem: Fluid Volume: Goal: Ability to maintain a balanced intake and output will improve Outcome: Progressing 1241ml PO fluid restriction. NS IVF infusing as ordered due to hyponatremia (Na 126). Nephrology consulted.  Problem: Nutrition: Goal: Adequate nutrition will be maintained Outcome: Progressing Poor PO intake. Pt was able to eat applesauce x2 bedtime snack- tolerated well.

## 2015-10-31 NOTE — Evaluation (Signed)
Physical Therapy Evaluation Patient Details Name: [name] MRN: [id number] DOB: [birth year] Today's Date: 10/31/2015   History of Present Illness  Pt is a 54 y.o. male admitted 10/27/15 with abdominal pain, general weakness, and lightheadedness x5 days.  Pt s/p 1 unit PRBC's d/t anemia.  Pt s/p fall 10/30/15 with lacerations on head.  CT of head negative for fx and acute intracranial findings (positive for small midline frontal scalp contusion).  Pt also with multiple B lower rib fx's (present prior to current admission).  Clinical Impression  Currently pt demonstrates impairments with level of alertness and limitations with functional mobility.  Pt did not verbalize any words during session and occasionally opening his eyes.  Pt inconsistent with following 1 step commands.  Currently pt is total assist for bed mobility and sitting on edge of bed.  Pt with recent hospital admission and was ambulatory at that time.  Pt would benefit from skilled PT to address above noted impairments and functional limitations.  Pt is currently not safe to discharge home alone; pt may benefit from STR if pt becomes more alert and is able to participate more.     Follow Up Recommendations  (STR pending pt's ability to participate)    Equipment Recommendations   (TBD)    Recommendations for Other Services       Precautions / Restrictions Precautions Precautions: Fall Restrictions Weight Bearing Restrictions: No      Mobility  Bed Mobility Overal bed mobility: Needs Assistance;+2 for physical assistance Bed Mobility: Supine to Sit;Sit to Supine     Supine to sit: Total assist;+2 for physical assistance Sit to supine: Total assist;+2 for physical assistance   General bed mobility comments: max assist plus SBA for sitting balance  Transfers                    Ambulation/Gait                Stairs            Wheelchair Mobility    Modified Rankin (Stroke Patients Only)        Balance Overall balance assessment: Needs assistance Sitting-balance support: No upper extremity supported;Feet supported Sitting balance-Leahy Scale: Zero Sitting balance - Comments: pt lethargic and leaning posterior                                     Pertinent Vitals/Pain Pain Assessment: Faces Pain Score: 0-No pain  Vitals stable and WFL throughout treatment session.    Home Living                   Additional Comments: Pt unable to verbalize any PLOF but per chart pt lives alone in 1 level home with 3 steps to enter with railing.  Has RW.    Prior Function                 Hand Dominance        Extremity/Trunk Assessment   Upper Extremity Assessment: Generalized weakness           Lower Extremity Assessment: Generalized weakness         Communication      Cognition Arousal/Alertness: Lethargic Behavior During Therapy: Flat affect Overall Cognitive Status: Difficult to assess                      General Comments  Nursing cleared pt for participation in physical therapy.    Exercises        Assessment/Plan    PT Assessment Patient needs continued PT services  PT Diagnosis Generalized weakness;Difficulty walking   PT Problem List Decreased strength;Decreased activity tolerance;Decreased balance;Decreased mobility;Decreased cognition  PT Treatment Interventions DME instruction;Gait training;Stair training;Functional mobility training;Therapeutic activities;Therapeutic exercise;Balance training;Neuromuscular re-education;Patient/family education;Manual techniques   PT Goals (Current goals can be found in the Care Plan section) Acute Rehab PT Goals Patient Stated Goal: pt did not state any goals PT Goal Formulation: Patient unable to participate in goal setting    Frequency Min 2X/week   Barriers to discharge Decreased caregiver support;Inaccessible home environment      Co-evaluation                End of Session   Activity Tolerance: Patient limited by lethargy Patient left: in bed;with call bell/phone within reach;with bed alarm set;with nursing/sitter in room Nurse Communication: Mobility status         Time: 1330-1350 PT Time Calculation (min) (ACUTE ONLY): 20 min   Charges:   PT Evaluation $Initial PT Evaluation Tier I: 1 Procedure     PT G CodesLeitha Bleak 11-05-15, 3:32 PM Leitha Bleak, [place]

## 2015-11-01 ENCOUNTER — Encounter
Admission: EM | Disposition: A | Discharge status: Home Health | Payer: Self-pay | Source: Home / Self Care | Attending: Internal Medicine

## 2015-11-01 LAB — BLOOD GAS, ARTERIAL
ACID-BASE EXCESS: 0.8 mmol/L (ref 0.0–3.0)
Allens test (pass/fail): POSITIVE — AB
BICARBONATE: 25.3 meq/L (ref 21.0–28.0)
FIO2: 0.21
O2 SAT: 96.5 %
PATIENT TEMPERATURE: 37
pCO2 arterial: 39 mmHg (ref 32.0–48.0)
pH, Arterial: 7.42 (ref 7.350–7.450)
pO2, Arterial: 84 mmHg (ref 83.0–108.0)

## 2015-11-01 LAB — CARBAMAZEPINE LEVEL, TOTAL: CARBAMAZEPINE LVL: 6.5 ug/mL (ref 4.0–12.0)

## 2015-11-01 LAB — BASIC METABOLIC PANEL
ANION GAP: 5 (ref 5–15)
BUN: 23 mg/dL — AB (ref 6–20)
CHLORIDE: 92 mmol/L — AB (ref 101–111)
CO2: 26 mmol/L (ref 22–32)
Calcium: 8.4 mg/dL — ABNORMAL LOW (ref 8.9–10.3)
Creatinine, Ser: 0.97 mg/dL (ref 0.61–1.24)
GFR calc Af Amer: >60 mL/min (ref >60)
Glucose, Bld: 87 mg/dL (ref 65–99)
POTASSIUM: 4.1 mmol/L (ref 3.5–5.1)
SODIUM: 123 mmol/L — AB (ref 135–145)

## 2015-11-01 LAB — SODIUM
SODIUM: 124 mmol/L — AB (ref 135–145)
SODIUM: 129 mmol/L — AB (ref 135–145)

## 2015-11-01 LAB — OSMOLALITY, URINE: Osmolality, Ur: 536 mOsm/kg (ref 300–900)

## 2015-11-01 LAB — VALPROIC ACID LEVEL: VALPROIC ACID LVL: 102 ug/mL — AB (ref 50.0–100.0)

## 2015-11-01 LAB — SODIUM, URINE, RANDOM: Sodium, Ur: 133 mmol/L

## 2015-11-01 SURGERY — EGD (ESOPHAGOGASTRODUODENOSCOPY)
Anesthesia: Moderate Sedation | Laterality: Left

## 2015-11-01 SURGERY — COLONOSCOPY
Anesthesia: General

## 2015-11-01 MED ORDER — SODIUM CHLORIDE 0.9 % IV BOLUS (SEPSIS)
1000.0000 mL | Freq: Once | INTRAVENOUS | Status: AC
  Administered 2015-11-01: 13:00:00 1000 mL via INTRAVENOUS

## 2015-11-01 MED ORDER — TOLVAPTAN 15 MG PO TABS
15.0000 mg | ORAL_TABLET | ORAL | Status: DC
  Administered 2015-11-01: 15:00:00 15 mg via ORAL
  Filled 2015-11-01: qty 1

## 2015-11-01 MED ORDER — SODIUM CHLORIDE 0.9 % IV BOLUS (SEPSIS)
1000.0000 mL | INTRAVENOUS | Status: AC | PRN
  Administered 2015-11-01: 19:00:00 1000 mL via INTRAVENOUS
  Filled 2015-11-01: qty 1000

## 2015-11-01 NOTE — Plan of Care (Signed)
Problem: Education: Goal: Knowledge of [facility] Education information/materials will improve Outcome: Progressing Pt education done, no question or concerns at this time  Problem: Safety: Goal: Ability to remain free from injury will improve Outcome: Progressing Pt is high falls, bed alarm in use, safety sitter at bedside  Problem: Tissue Perfusion: Goal: Risk factors for ineffective tissue perfusion will decrease Outcome: Progressing Pt had low BP today, NS bolus given, BP meds discontinued, with improvement in BP  Problem: Fluid Volume: Goal: Ability to maintain a balanced intake and output will improve Outcome: Progressing Pt had low BP today, NS bolus given, BP meds discontinued, with improvement in BP  Problem: Nutrition: Goal: Adequate nutrition will be maintained Outcome: Progressing Pt has had poor po intake, pt did drink ensure

## 2015-11-01 NOTE — Progress Notes (Signed)
Patient s/p fall yesterday as reported by nurse. He was too confused to take prep for procedures. They were cancelled. His Hb is going up. Would plan for out patient workup.

## 2015-11-01 NOTE — Plan of Care (Signed)
Problem: Safety: Goal: Ability to remain free from injury will improve Outcome: Progressing Pt remains in bed and free from falls.  Pt has been calm and non impulsive.  Safety sitter in place.  Bed in low position and bed alarm engaged.  Problem: Fluid Volume: Goal: Ability to maintain a balanced intake and output will improve Outcome: Progressing Pt continues on IV NS with continued monitoring of sodium levels.

## 2015-11-01 NOTE — Progress Notes (Signed)
[facility]  ROUNDING NOTE   Subjective:  Pt not communicating at this point in time. Na down to 123. Uric acid low at present at 4.3, suggesting SIADH.  Objective:  Vital signs in last 24 hours:  Temp:  [97.3 F (36.3 C)-98.6 F (37 C)] 98 F (36.7 C) (11/15 1234) Pulse Rate:  [56-70] 58 (11/15 1234) Resp:  [16] 16 (11/15 1234) BP: (95-140)/(37-79) 95/37 mmHg (11/15 1234) SpO2:  [97 %-100 %] 97 % (11/15 1234) Weight:  [58.015 kg (127 lb 14.4 oz)] 58.015 kg (127 lb 14.4 oz) (11/15 0430)  Weight change: 1.497 kg (3 lb 4.8 oz) Filed Weights   10/30/15 0500 10/31/15 0500 11/01/15 0430  Weight: 56.473 kg (124 lb 8 oz) 56.518 kg (124 lb 9.6 oz) 58.015 kg (127 lb 14.4 oz)    Intake/Output: I/O last 3 completed shifts: In: 360 [P.O.:360] Out: 600 [Urine:600]   Intake/Output this shift:  Total I/O In: 230 [P.O.:230] Out: 300 [Urine:300]  Physical Exam: General: somnolent  Head: Laceration on forehead covered with dressing. Moist oral mucosal membranes  Eyes: Anicteric  Neck: Supple, trachea midline  Lungs:  Clear to auscultation  Heart: Regular rate and rhythm, no rubs  Abdomen:  Soft, nontender, BS present  Extremities:  trace peripheral edema.  Neurologic: Not following commands, somnolent  Skin: Forehead laceration       Basic Metabolic Panel:  Recent Labs Lab 10/28/15 0503 10/29/15 0525 10/29/15 0859 10/29/15 1626 10/30/15 0518 10/31/15 0455 11/01/15 0519  NA 127* 127* 125* 126* 126* 125* 123*  K 3.9 4.3  --   --  3.7 4.1 4.1  CL 92* 92*  --   --  90* 90* 92*  CO2 27 28  --   --  26 27 26   GLUCOSE 85 92  --   --  93 108* 87  BUN 23* 23*  --   --  23* 25* 23*  CREATININE 1.15 1.12  --   --  1.07 1.04 0.97  CALCIUM 8.6* 8.9  --   --  9.0 8.6* 8.4*    Liver Function Tests:  Recent Labs Lab 10/28/15 0503  AST 20  ALT 20  ALKPHOS 59  BILITOT 0.7  PROT 5.2*  ALBUMIN 2.8*   No results for input(s): LIPASE, AMYLASE in the last 168  hours. No results for input(s): AMMONIA in the last 168 hours.  CBC:  Recent Labs Lab 10/27/15 0703 10/27/15 1758 10/28/15 0503 10/29/15 0525 10/30/15 0518 10/31/15 0455  WBC 1.8*  --  2.5* 3.1* 2.6* 3.8  HGB 7.3* 8.7* 8.3* 8.9* 9.5* 10.2*  HCT 21.7*  --  23.7* 25.0* 27.5* 29.2*  MCV 99.7  --  97.3 97.1 98.3 98.9  PLT 158  --  135* 137* 143* 149*    Cardiac Enzymes:  Recent Labs Lab 10/27/15 0703  TROPONINI <0.03    BNP: Invalid input(s): POCBNP  CBG:  Recent Labs Lab 10/28/15 1557 10/28/15 1626  GLUCAP 55 107*    Microbiology: Results for orders placed or performed in visit on 11/26/13  Urine culture     Status: None   Collection Time: 11/27/13  5:05 AM  Result Value Ref Range Status   Micro Text Report   Final       SOURCE: CLEAN CATCH    COMMENT                   MIXED BACTERIAL ORGANISMS   COMMENT  RESULTS SUGGESTIVE OF CONTAMINATION   ANTIBIOTIC                                                      Clostridium Difficile [facility])     Status: None   Collection Time: 11/29/13  9:51 AM  Result Value Ref Range Status   Micro Text Report   Final       C.DIFFICILE ANTIGEN       C.DIFFICILE GDH ANTIGEN : NEGATIVE   C.DIFFICILE TOXIN A/B     C.DIFFICILE TOXINS A AND B : NEGATIVE   INTERPRETATION            Negative for C. difficile.    ANTIBIOTIC                                                        Coagulation Studies: No results for input(s): LABPROT, INR in the last 72 hours.  Urinalysis: No results for input(s): COLORURINE, LABSPEC, PHURINE, GLUCOSEU, HGBUR, BILIRUBINUR, KETONESUR, PROTEINUR, UROBILINOGEN, NITRITE, LEUKOCYTESUR in the last 72 hours.  Invalid input(s): APPERANCEUR    Imaging: Ct Head Wo Contrast  10/31/2015  CLINICAL DATA:  [place] last night and Hospital and hit head. EXAM: CT HEAD WITHOUT CONTRAST TECHNIQUE: Contiguous axial images were obtained from the base of the skull through the vertex without  intravenous contrast. COMPARISON:  10/30/2015 FINDINGS: Stable age advanced cerebral atrophy, ventriculomegaly and periventricular white matter disease. No extra-axial fluid collections are identified. No CT findings for acute hemispheric infarction or intracranial hemorrhage. No mass lesions. The brainstem and cerebellum are normal. No acute skull fracture. The paranasal sinuses and mastoid air cells are clear except for a mucous retention cyst in the right frontal sinus. IMPRESSION: No acute intracranial findings. Stable age advanced cerebral atrophy, ventriculomegaly and periventricular white matter disease. Electronically Signed   By: Marijo Sanes M.D.   On: 10/31/2015 17:52   Ct Head Wo Contrast  10/30/2015  CLINICAL DATA:  [place] from bed tonight with laceration frontal region. EXAM: CT HEAD WITHOUT CONTRAST TECHNIQUE: Contiguous axial images were obtained from the base of the skull through the vertex without intravenous contrast. COMPARISON:  10/27/2015, 10/09/2015 and 07/18/2010 FINDINGS: Ventricles are mildly prominent for patient age but unchanged. Cisterns and remaining CSF spaces are within normal. There is no mass, mass effect, shift of midline structures or acute hemorrhage. No evidence of acute infarction. Small midline frontal scalp contusion. No fracture. IMPRESSION: No acute intracranial findings. Mild stable atrophic change. Small midline frontal scalp contusion.  No fracture. Electronically Signed   By: Marin Olp M.D.   On: 10/30/2015 20:35     Medications:   . sodium chloride 75 mL/hr at 11/01/15 0015   . amLODipine  5 mg Oral Daily  . carbamazepine  200 mg Oral Q1200  . carbamazepine  400 mg Oral QAC breakfast  . carbamazepine  400 mg Oral QHS  . divalproex  1,000 mg Oral QAC breakfast  . divalproex  1,000 mg Oral QHS  . docusate sodium  100 mg Oral BID  . feeding supplement (ENSURE ENLIVE)  1 Bottle Oral BID BM  . lisinopril  20 mg Oral Daily  .  OLANZapine  5 mg Oral  QHS  . pantoprazole  40 mg Oral Daily  . propranolol  40 mg Oral BID  . sodium chloride  1,000 mL Intravenous Once  . sodium chloride  3 mL Intravenous Q12H   acetaminophen **OR** acetaminophen, butalbital-acetaminophen-caffeine, ondansetron **OR** ondansetron (ZOFRAN) IV  Assessment/ Plan:  54 y.o. male with hypertension, seizure disorder, chronic speech disorder, CKD stage II, anemia of CKD, SHPTH.  1.  Hyponatremia. 2.  CKD stage II 3.  Hypertension.  Plan:  Urine osmolality high, serum osmolality low, and serum uric acid level low at 4.3. This is suggestive of SIADH. Patient does have CNS issues in the form of ventriculomegaly. Therefore we will stop 0.9 normal saline and attempt a trial of tolvaptan. We will monitor serum sodium closely thereafter. Further plan as patient progresses.   LOS: 5 [name] 11/15/20162:35 PM

## 2015-11-01 NOTE — Consult Note (Signed)
Palliative Medicine Inpatient Consult Note   Name: [name] Date: 11/01/2015 MRN: [id number]  DOB: 04-Jul-1961  Referring Physician: Vaughan Basta, MD  Palliative Care consult requested for this 54 y.o. male for goals of medical therapy in patient with symptomatic anemia, hyponatremia, metabolic encephalopathy, unintended wt loss of 30 lbs in 3 mos, and seizure disorder.  He  Has a number of medical conditions that have caused him to be rehospitalized three times in 11 days.  Palliative Care is consulted primarily to help with decision making about wishes for type and degrees of aggressive care, since patient has been too confused to make decisions for himself at times and since there is not family.    TODAY'S DISCUSSIONS AND DECISIONS:  I do not feel patient can make his own healthcare decisions.  He was fully alert when I saw him. His labs, while not perfect, are good enough where he should not have electrolyte or  medication effects that are making him sedated.  Despite being fully alert, he was confused.    He probably has good days and bad --good moments when he is more 'with it' and then bad moments when he is more confused.  We cannot wait for him to have 'good moments'.  He shows signs of serious self-neglect (long ragged nails with dirt underneath, etc).  He has not had relatives to appear and no friends involved enough to show up either.    I would recommend pursuit of guardianship by DSS --though psychiatry should reassess pt and make their recommendation as well..  I would recommend that he have the question of optimal seizure meds be addressed.  There is the thought that the seizure meds might be causing his ever recurrent hyponatremia.    He needs custodial care as his life is at risk due to malnutrition.  He is not going to be able to follow up with outpatient GI work up unless he is in some type of custodial care environment that ensures his compliance by making  arrangements happen for him.    I would recommend a folate, B12, and HIV labs. ( I will ask Dr Bridgett Larsson about these).    I would also recommend that in an emergency situation, his physician make decisions for him, until such time that he has a guardian or temporary guardian.  And, would also recommend that we call his primary care doctors and try to find any old or recent records of contacts / friends and see what can be learned about any possible family or involved friends.      IMPRESSION: 1.  Recurrent hyponatremia ---Treated with Tolvaptan and monitored by nephrologist this adm ---Patient has had ER visits and multiple hospital admissions for this same diagnosis for several years at least.  ---Unclear if SIADH or other etiology as yet.  2.  Failure to thrive, severe malnutrition, unintended wt loss of '30 lbs in 3 month' reported ---has had trouble eating adequately for years. 3.  Anemia  ---macrocytic with Hb 9.2 (MCV is 100.3) ---stool is neg for Occult Blood  No B12 or Folate in lab results as yet. 4.  Abdominal Pain 5.  Seizure disorder ---it is thought that his seizure meds might be causing the hyponatremia (possibly).   6.  Chronic Kidney Disease ---Stage II  ---Current GFR >60 however ( it fluctuates) 7.  HTN ---nl BP off all BP meds now 8.  Falls ---Had fall in hospital 11/13 with resultant laceration to head. ----safety sitter due  to decreased awareness of safety ---CT of chest, abdomen and pelvis were neg except for paritally healed  lower rib fractures.  9.  Metabolic encephalopathy ----Psychiatry Rec Zyprexa to help with depression and appetite on 11/13 ----CT head is negative  For acute issue ---with decreased awareness resulting in cancellation of EGD by GI today ---with confusion when awake 10.  Ventriculomegaly  ---prompted change from NS to Tolvaptan for Hyponatremia mgmt this adm.  11.  Seizure Disorder ---Tegretol and Depakote are usual home meds but  Neurology is consulted about possible alternate medication regimen due to his SIADH etc.  Neuro note did not address change in medication issue.   12.  Lack of social support ---impoverished (medicaid potential) ---he reportedly has neighbors and a friend who are involved in his life somewhat. ---DSS Guardianship may need to be pursued.  Once his sodium is normal and he has had a few days here, he can be reassessed for capacity to make decisions.  13.  Essential Tremors ---treated wth propanolol but this was held due to mild bradycardia 14.  Near syncope with chronic abdominal pain ---transfused on unit of PRBCs with repeat Hgb 8.3 - 9.5 15.  Migraine headaches        REVIEW OF SYSTEMS:  Patient is not able to provide ROS due to altered mental status.    SPIRITUAL SUPPORT SYSTEM: No.  SOCIAL HISTORY:  reports that he has never smoked. He does not have any smokeless tobacco history on file. He reports that he does not drink alcohol or use illicit drugs.  He has no family.  A friend has been involved somewhat.  He is not able (at least yet) to make any decisions for himself.  We do not know more than we know about his social situation.   LEGAL DOCUMENTS:  none  CODE STATUS: Full code  PAST MEDICAL HISTORY: Past Medical History  Diagnosis Date  . Hypertension   . Seizures ([place])   . Kidney disease   . Anemia   . Migraine   . Hyponatremia     chronic    PAST SURGICAL HISTORY:  Past Surgical History  Procedure Laterality Date  . None      ALLERGIES:  has No Known Allergies.  MEDICATIONS:  Current Facility-Administered Medications  Medication Dose Route Frequency Provider Last Rate Last Dose  . 0.9 %  sodium chloride infusion   Intravenous Continuous Munsoor Lateef, MD 75 mL/hr at 11/01/15 0015    . acetaminophen (TYLENOL) tablet 650 mg  650 mg Oral Q6H PRN Nicholes Mango, MD   650 mg at 10/31/15 0754   Or  . acetaminophen (TYLENOL) suppository 650 mg  650 mg Rectal Q6H PRN  Nicholes Mango, MD      . amLODipine (NORVASC) tablet 5 mg  5 mg Oral Daily Nicholes Mango, MD   5 mg at 11/01/15 1014  . butalbital-acetaminophen-caffeine (FIORICET, ESGIC) 50-325-40 MG per tablet 1-2 tablet  1-2 tablet Oral Q4H PRN Nicholes Mango, MD      . carbamazepine (TEGRETOL) tablet 200 mg  200 mg Oral Q1200 Nicholes Mango, MD   200 mg at 11/01/15 1247  . carbamazepine (TEGRETOL) tablet 400 mg  400 mg Oral QAC breakfast Nicholes Mango, MD   400 mg at 11/01/15 1014  . carbamazepine (TEGRETOL) tablet 400 mg  400 mg Oral QHS Nicholes Mango, MD   400 mg at 11/01/15 0007  . divalproex (DEPAKOTE) DR tablet 1,000 mg  1,000 mg Oral QAC breakfast Aruna Gouru,  MD   1,000 mg at 11/01/15 1014  . divalproex (DEPAKOTE) DR tablet 1,000 mg  1,000 mg Oral QHS Nicholes Mango, MD   1,000 mg at 11/01/15 0007  . docusate sodium (COLACE) capsule 100 mg  100 mg Oral BID Nicholes Mango, MD   100 mg at 11/01/15 1014  . feeding supplement (ENSURE ENLIVE) (ENSURE ENLIVE) liquid 237 mL  1 Bottle Oral BID BM Aruna Gouru, MD   237 mL at 11/01/15 1248  . lisinopril (PRINIVIL,ZESTRIL) tablet 20 mg  20 mg Oral Daily Nicholes Mango, MD   20 mg at 11/01/15 1014  . OLANZapine (ZYPREXA) tablet 5 mg  5 mg Oral QHS Dewain Penning, MD   5 mg at 11/01/15 0011  . ondansetron (ZOFRAN) tablet 4 mg  4 mg Oral Q6H PRN Nicholes Mango, MD       Or  . ondansetron (ZOFRAN) injection 4 mg  4 mg Intravenous Q6H PRN Aruna Gouru, MD      . pantoprazole (PROTONIX) EC tablet 40 mg  40 mg Oral Daily Nicholes Mango, MD   40 mg at 11/01/15 1014  . propranolol (INDERAL) tablet 40 mg  40 mg Oral BID Nicholes Mango, MD   40 mg at 11/01/15 1014  . sodium chloride 0.9 % bolus 1,000 mL  1,000 mL Intravenous Once Vaughan Basta, MD   1,000 mL at 11/01/15 1252  . sodium chloride 0.9 % injection 3 mL  3 mL Intravenous Q12H Nicholes Mango, MD   3 mL at 10/30/15 0918    Vital Signs: BP 95/37 mmHg  Pulse 58  Temp(Src) 98 F (36.7 C) (Axillary)  Resp 16  Ht 5\' 3"  (1.6 m)  Wt 58.015  kg (127 lb 14.4 oz)  BMI 22.66 kg/m2  SpO2 97% Filed Weights   10/30/15 0500 10/31/15 0500 11/01/15 0430  Weight: 56.473 kg (124 lb 8 oz) 56.518 kg (124 lb 9.6 oz) 58.015 kg (127 lb 14.4 oz)    Estimated body mass index is 22.66 kg/(m^2) as calculated from the following:   Height as of this encounter: 5\' 3"  (1.6 m).   Weight as of this encounter: 58.015 kg (127 lb 14.4 oz).  PERFORMANCE STATUS (ECOG) : 4 - Bedbound  PHYSICAL EXAM: Pt is seen to be wide awake in a medical bed.  He has a very slight tremor so his head moves a bit, but he was able to talk to me.  He answered questions with yes/ no answers until I realized that every answer was NO.  I asked some nonsense questions and some questions for which the answer should be 'YES' -but he still said 'NO". He was able to say Hello and 'see you later'.  He has filthy fingernails indicative of self neglect.   He says he lives in [place]".  He then began to move the covers and pillows this way and that --and could not explain what he was doing with them.  He was fully aware but confused.   Balding head with lac on forehead covered w/ dressing EOMI  --he focused Nares and OP clear Neck w/o JVD or TM Hrt rrr no mgr Lungs cta  Abd soft and NT Ext no mottling or cyanosis.       LABS: CBC:    Component Value Date/Time   WBC 3.8 10/31/2015 0455   WBC 3.8 03/10/2015 1103   HGB 10.2* 10/31/2015 0455   HGB 9.0* 03/10/2015 1103   HCT 29.2* 10/31/2015 0455   HCT  27.1* 03/10/2015 1103   PLT 149* 10/31/2015 0455   PLT 148* 03/10/2015 1103   MCV 98.9 10/31/2015 0455   MCV 99 03/10/2015 1103   NEUTROABS 1.2* 10/25/2015 0738   NEUTROABS 2.0 10/06/2014 1128   LYMPHSABS 0.6* 10/25/2015 0738   LYMPHSABS 1.7 10/06/2014 1128   MONOABS 0.4 10/25/2015 0738   MONOABS 0.5 10/06/2014 1128   EOSABS 0.0 10/25/2015 0738   EOSABS 0.2 10/06/2014 1128   BASOSABS 0.0 10/25/2015 0738   BASOSABS 0.0 10/06/2014 1128   Comprehensive  Metabolic Panel:    Component Value Date/Time   NA 123* 11/01/2015 0519   NA 131* 03/10/2015 1103   K 4.1 11/01/2015 0519   K 4.5 03/10/2015 1103   CL 92* 11/01/2015 0519   CL 96* 03/10/2015 1103   CO2 26 11/01/2015 0519   CO2 26 03/10/2015 1103   BUN 23* 11/01/2015 0519   BUN 7 03/10/2015 1103   CREATININE 0.97 11/01/2015 0519   CREATININE 1.19 03/10/2015 1103   GLUCOSE 87 11/01/2015 0519   GLUCOSE 87 03/10/2015 1103   CALCIUM 8.4* 11/01/2015 0519   CALCIUM 8.8* 03/10/2015 1103   AST 20 10/28/2015 0503   AST 19 10/06/2014 1128   ALT 20 10/28/2015 0503   ALT 17 10/06/2014 1128   ALKPHOS 59 10/28/2015 0503   ALKPHOS 50 10/06/2014 1128   BILITOT 0.7 10/28/2015 0503   BILITOT 0.3 10/06/2014 1128   PROT 5.2* 10/28/2015 0503   PROT 6.8 10/06/2014 1128   ALBUMIN 2.8* 10/28/2015 0503   ALBUMIN 3.6 10/06/2014 1128    More than 50% of the visit was spent in counseling/coordination of care: Yes  Time Spent: 80 minutes

## 2015-11-01 NOTE — Progress Notes (Signed)
[facility] [place] NAME: [name]    MR#:  [id number]  DATE OF BIRTH:  08-02-1961   CHIEF COMPLAINT: Patient is drowsy since yesterday, he was awake this morning, had his meds and breakfast- sleeping currently.  SUBJECTIVE:  Patient is sleepy.   Not oriented, so not able to give any review of systems, he is not reliable due to mental status.   DRUG ALLERGIES:  No Known Allergies  VITALS:  Blood pressure 138/73, pulse 70, temperature 98.6 F (37 C), temperature source Oral, resp. rate 16, height 5\' 3"  (1.6 m), weight 58.015 kg (127 lb 14.4 oz), SpO2 99 %.  PHYSICAL EXAMINATION:  GENERAL:  54 y.o.-year-old patient lying in the bed with no acute distress.  EYES: Pupils equal, round, reactive to light and accommodation. No scleral icterus.  HEENT: Head have some dressings- after laceration last evening, normocephalic. Oropharynx and nasopharynx clear.  NECK:  Supple, no jugular venous distention. No thyroid enlargement, no tenderness.  LUNGS: Normal breath sounds bilaterally, no wheezing, rales,rhonchi or crepitation. No use of accessory muscles of respiration.  CARDIOVASCULAR: S1, S2 normal. No murmurs, rubs, or gallops.  ABDOMEN: Soft, nontender, nondistended. Bowel sounds present. No organomegaly or mass.  EXTREMITIES: No pedal edema, cyanosis, or clubbing.  NEUROLOGIC: sleepy, opens eyes to stimuli, not following commands. Moved limbs spontaneously. He is depressed and not oriented to surrounding. PSYCHIATRIC: The patient is depressed and disoriented.  SKIN: No obvious rash, lesion, or ulcer.    LABORATORY PANEL:   CBC  Recent Labs Lab 10/31/15 0455  WBC 3.8  HGB 10.2*  HCT 29.2*  PLT 149*   ------------------------------------------------------------------------------------------------------------------  Chemistries   Recent Labs Lab 10/28/15 0503  11/01/15 0519  NA 127*  < > 123*  K 3.9  < > 4.1  CL 92*   < > 92*  CO2 27  < > 26  GLUCOSE 85  < > 87  BUN 23*  < > 23*  CREATININE 1.15  < > 0.97  CALCIUM 8.6*  < > 8.4*  AST 20  --   --   ALT 20  --   --   ALKPHOS 59  --   --   BILITOT 0.7  --   --   < > = values in this interval not displayed. ------------------------------------------------------------------------------------------------------------------  Cardiac Enzymes  Recent Labs Lab 10/27/15 0703  TROPONINI <0.03   ------------------------------------------------------------------------------------------------------------------  RADIOLOGY:  Ct Head Wo Contrast  10/31/2015  CLINICAL DATA:  [facility] last night and Hospital and hit head. EXAM: CT HEAD WITHOUT CONTRAST TECHNIQUE: Contiguous axial images were obtained from the base of the skull through the vertex without intravenous contrast. COMPARISON:  10/30/2015 FINDINGS: Stable age advanced cerebral atrophy, ventriculomegaly and periventricular white matter disease. No extra-axial fluid collections are identified. No CT findings for acute hemispheric infarction or intracranial hemorrhage. No mass lesions. The brainstem and cerebellum are normal. No acute skull fracture. The paranasal sinuses and mastoid air cells are clear except for a mucous retention cyst in the right frontal sinus. IMPRESSION: No acute intracranial findings. Stable age advanced cerebral atrophy, ventriculomegaly and periventricular white matter disease. Electronically Signed   By: Marijo Sanes M.D.   On: 10/31/2015 17:52   Ct Head Wo Contrast  10/30/2015  CLINICAL DATA:  [facility] from bed tonight with laceration frontal region. EXAM: CT HEAD WITHOUT CONTRAST TECHNIQUE: Contiguous axial images were obtained from the base of the skull through the vertex without intravenous contrast.  COMPARISON:  10/27/2015, 10/09/2015 and 07/18/2010 FINDINGS: Ventricles are mildly prominent for patient age but unchanged. Cisterns and remaining CSF spaces are within normal. There is no mass,  mass effect, shift of midline structures or acute hemorrhage. No evidence of acute infarction. Small midline frontal scalp contusion. No fracture. IMPRESSION: No acute intracranial findings. Mild stable atrophic change. Small midline frontal scalp contusion.  No fracture. Electronically Signed   By: Marin Olp M.D.   On: 10/30/2015 20:35     ASSESSMENT AND PLAN:   #Near syncope secondary to Symptomatic anemia with chronic abdominal pain Transfused 1 unit of blood and repeat hemoglobin at 8.3--9.5  Patient is agreeable for  EGD and colonoscopy as recommended by gastroenterology, which was scheduled for 10/31/15 a.m. by Dr. Allen Norris. But as he is more drowsy- could not do prep- so it is d/ced for now.  Stool for occult blood is negative in the ED.  Wll continue GI prophylaxis with Protonix  CT chest, abdomen and pelvis are negative except for lower rib fractures,   He have weakness and frequent falls- had one in hospital 10/30/15 with lacerations on head.  CT head negative.  #. Failure to thrive-unintentional weight loss of 30 pounds in 3 months CT head is negative for any metastases. CT chest, abdomen and pelvis -no malignancy  Patient is scheduled for EGD and colonoscopy on Tuesday a.m.  appreciate GI recommendations  TSH is normal If not clear source, then likely will be due to psych issues.  consider palliative care for repeated admissions for similar reason.  # metabolic encephalopathy - altered mental status   He has worsening n hyponatremia- CT head is negative.   Will check tegretol and depakot level- and call pallaitive care consult.   Vitals stable.  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 Labs- Na is down to 123.   consulted nephrology to help , if possible change seizure meds.  #Partially healed lower  rib fractures-could be from malnourishment from failure to thrive Patient is reporting recent fall. Denies any abuse or trauma/ injury  #. Essential tremors-continue home medication propranolol  #. Hypertension-continue amlodipine and ACE inhibitor  #. History of seizures  Resumed his home medication Tegretol and Depakote, but patient is with SIADH   neurology is consulted regarding alternative medication choices for his seizures .   depakot level was high on 11/12- decreased dose on 11/01/15.   #PT is consulted for deconditioning -follow-up with their recommendations    All the records are reviewed and case discussed with Care Management/Social Workerr. Management plans discussed with the patient, family and they are in agreement.  CODE STATUS: Full code  TOTAL TIME TAKING CARE OF THIS PATIENT:35  minutes.   POSSIBLE D/C IN 2-3DAYS, DEPENDING ON CLINICAL CONDITION.   Vaughan Basta M.D on 11/01/2015 at 12:09 PM  Between 7am to 6pm - Pager - [phone] After 6pm go to [email or web address] - password [email or web address] [facility]  Office  [phone]  CC: Primary care physician; [name], MD

## 2015-11-02 DIAGNOSIS — R251 Tremor, unspecified: Secondary | ICD-10-CM

## 2015-11-02 DIAGNOSIS — Z608 Other problems related to social environment: Secondary | ICD-10-CM

## 2015-11-02 DIAGNOSIS — Z515 Encounter for palliative care: Secondary | ICD-10-CM

## 2015-11-02 DIAGNOSIS — E871 Hypo-osmolality and hyponatremia: Secondary | ICD-10-CM

## 2015-11-02 DIAGNOSIS — G9341 Metabolic encephalopathy: Secondary | ICD-10-CM

## 2015-11-02 DIAGNOSIS — G9389 Other specified disorders of brain: Secondary | ICD-10-CM

## 2015-11-02 DIAGNOSIS — R634 Abnormal weight loss: Secondary | ICD-10-CM

## 2015-11-02 DIAGNOSIS — G40909 Epilepsy, unspecified, not intractable, without status epilepticus: Secondary | ICD-10-CM

## 2015-11-02 DIAGNOSIS — R109 Unspecified abdominal pain: Secondary | ICD-10-CM

## 2015-11-02 DIAGNOSIS — R410 Disorientation, unspecified: Secondary | ICD-10-CM

## 2015-11-02 LAB — BASIC METABOLIC PANEL
Anion gap: 6 (ref 5–15)
BUN: 21 mg/dL — ABNORMAL HIGH (ref 6–20)
CALCIUM: 8.6 mg/dL — AB (ref 8.9–10.3)
CHLORIDE: 100 mmol/L — AB (ref 101–111)
CO2: 26 mmol/L (ref 22–32)
CREATININE: 1.1 mg/dL (ref 0.61–1.24)
GFR calc Af Amer: >60 mL/min (ref >60)
GFR calc non Af Amer: >60 mL/min (ref >60)
Glucose, Bld: 89 mg/dL (ref 65–99)
POTASSIUM: 4.4 mmol/L (ref 3.5–5.1)
SODIUM: 132 mmol/L — AB (ref 135–145)

## 2015-11-02 LAB — CBC
HCT: 27.4 % — ABNORMAL LOW (ref 40.0–52.0)
Hemoglobin: 9.2 g/dL — ABNORMAL LOW (ref 13.0–18.0)
MCH: 33.7 pg (ref 26.0–34.0)
MCHC: 33.6 g/dL (ref 32.0–36.0)
MCV: 100.3 fL — AB (ref 80.0–100.0)
PLATELETS: 122 10*3/uL — AB (ref 150–440)
RBC: 2.73 MIL/uL — AB (ref 4.40–5.90)
RDW: 18.9 % — ABNORMAL HIGH (ref 11.5–14.5)
WBC: 3.9 10*3/uL (ref 3.8–10.6)

## 2015-11-02 LAB — SODIUM
SODIUM: 131 mmol/L — AB (ref 135–145)
SODIUM: 132 mmol/L — AB (ref 135–145)
Sodium: 131 mmol/L — ABNORMAL LOW (ref 135–145)

## 2015-11-02 MED ORDER — DIVALPROEX SODIUM 500 MG PO DR TAB
750.0000 mg | DELAYED_RELEASE_TABLET | Freq: Every day | ORAL | Status: DC
  Administered 2015-11-02: 750 mg via ORAL
  Filled 2015-11-02: qty 1

## 2015-11-02 NOTE — Plan of Care (Addendum)
Pt has been more alert this shift then he has the prior two days, even speaking a couple of complete sentences. No c/o pain. PRN sitter not needed this shift as there have been no impulsive behaviors.  Problem: Safety: Goal: Ability to remain free from injury will improve Outcome: Progressing Pt is high falls, bed alarm in use, hourly safety rounding  Problem: Fluid Volume: Goal: Ability to maintain a balanced intake and output will improve Outcome: Progressing VSS, pt had urine output, pt incontinent this shift   Problem: Nutrition: Goal: Adequate nutrition will be maintained Outcome: Progressing Pt's diet upgraded to full liquids, good po intake during shift

## 2015-11-02 NOTE — Progress Notes (Signed)
[facility] at [place] NAME: [name]    MR#:  [id number]  DATE OF BIRTH:  Feb 23, 1961   CHIEF COMPLAINT:   SUBJECTIVE:  Patient is drowsy, did not answer questions. Per RN, he is more awake this am.   DRUG ALLERGIES:  No Known Allergies  VITALS:  Blood pressure 120/67, pulse 71, temperature 97.7 F (36.5 C), temperature source Oral, resp. rate 20, height 5\' 3"  (1.6 m), weight 57.561 kg (126 lb 14.4 oz), SpO2 100 %.  PHYSICAL EXAMINATION:  GENERAL:  54 y.o.-year-old patient lying in the bed with no acute distress.  EYES: Pupils equal, round, reactive to light and accommodation. No scleral icterus.  HEENT: dressings on head- after laceration last evening, normocephalic. NECK:  Supple, no jugular venous distention. No thyroid enlargement, no tenderness.  LUNGS: Normal breath sounds bilaterally, no wheezing, rales,rhonchi or crepitation. No use of accessory muscles of respiration.  CARDIOVASCULAR: S1, S2 normal. No murmurs, rubs, or gallops.  ABDOMEN: Soft, nontender, nondistended. Bowel sounds present. No organomegaly or mass.  EXTREMITIES: No pedal edema, cyanosis, or clubbing.  NEUROLOGIC: Unresponsive to oral stimuli, not following commands.  PSYCHIATRIC: The patient is unresponsive to oral stimuli. SKIN: No obvious rash, lesion, or ulcer.    LABORATORY PANEL:   CBC  Recent Labs Lab 11/02/15 0548  WBC 3.9  HGB 9.2*  HCT 27.4*  PLT 122*   ------------------------------------------------------------------------------------------------------------------  Chemistries   Recent Labs Lab 10/28/15 0503  11/02/15 0548  11/02/15 1429  NA 127*  < > 132*  < > 132*  K 3.9  < > 4.4  --   --   CL 92*  < > 100*  --   --   CO2 27  < > 26  --   --   GLUCOSE 85  < > 89  --   --   BUN 23*  < > 21*  --   --   CREATININE 1.15  < > 1.10  --   --   CALCIUM 8.6*  < > 8.6*  --   --   AST 20  --   --   --   --   ALT 20  --   --    --   --   ALKPHOS 59  --   --   --   --   BILITOT 0.7  --   --   --   --   < > = values in this interval not displayed. ------------------------------------------------------------------------------------------------------------------  Cardiac Enzymes  Recent Labs Lab 10/27/15 0703  TROPONINI <0.03   ------------------------------------------------------------------------------------------------------------------  RADIOLOGY:  Ct Head Wo Contrast  10/31/2015  CLINICAL DATA:  [facility] last night and Hospital and hit head. EXAM: CT HEAD WITHOUT CONTRAST TECHNIQUE: Contiguous axial images were obtained from the base of the skull through the vertex without intravenous contrast. COMPARISON:  10/30/2015 FINDINGS: Stable age advanced cerebral atrophy, ventriculomegaly and periventricular white matter disease. No extra-axial fluid collections are identified. No CT findings for acute hemispheric infarction or intracranial hemorrhage. No mass lesions. The brainstem and cerebellum are normal. No acute skull fracture. The paranasal sinuses and mastoid air cells are clear except for a mucous retention cyst in the right frontal sinus. IMPRESSION: No acute intracranial findings. Stable age advanced cerebral atrophy, ventriculomegaly and periventricular white matter disease. Electronically Signed   By: Marijo Sanes M.D.   On: 10/31/2015 17:52     ASSESSMENT AND PLAN:   #Near syncope secondary  to Symptomatic anemia with chronic abdominal pain Transfused 1 unit of blood and repeat hemoglobin at 8.3--9.5  Patient is agreeable for  EGD and colonoscopy as recommended by gastroenterology, which was scheduled for 10/31/15 a.m. by Dr. Allen Norris. But as he is more drowsy- could not do prep- so it is d/ced for now.  Stool for occult blood is negative in the ED. Continue Protonix  CT chest, abdomen and pelvis are negative except for lower rib fractures,   He have weakness and frequent falls- had one in hospital 10/30/15  with lacerations on head.  CT head negative.  #. Failure to thrive-unintentional weight loss of 30 pounds in 3 months CT head is negative for any metastases. CT chest, abdomen and pelvis -no malignancy  Patient is scheduled for EGD and colonoscopy on Tuesday a.m as above. If not clear source, then likely will be due to psych issues. Palliative care for repeated admissions for similar reason.  # metabolic encephalopathy - altered mental status   He had worsening hyponatremia- CT head is negative.  Normal tegretol and depakot level. Sodium level is improving. But the patient is still drowsy.  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 Restrict by mouth fluid intake to 1200 ml Was on samsca, which was discontinued by Dr. Holley Raring this morning. Na up to 132.   #Partially healed lower rib fractures.  #. Essential tremors-continue home medication propranolol  #. Hypertension-continue amlodipine and ACE inhibitor  #. History of seizures  Resumed his home medication Tegretol and Depakote, but patient is with SIADH   neurology is consulted regarding alternative medication choices for his seizures .   depakot level was high on 11/12- decreased dose on 11/01/15.   #PT is consulted for deconditioning -follow-up with their recommendations    All the records are reviewed and case discussed with Care Management/Social Workerr. Management plans discussed with the patient, family and they are in agreement. Greater than 50% time was spent on coordination of care and face-to-face counseling.  CODE STATUS: Full code  TOTAL TIME TAKING CARE OF THIS PATIENT:42  minutes.   POSSIBLE D/C IN 2-3DAYS, DEPENDING ON CLINICAL CONDITION.   Demetrios Loll M.D on 11/02/2015 at 3:34 PM  Between 7am to 6pm - Pager - [phone] After 6pm go to [email or web address] - password [email or web address]  Office  [phone]  CC: Primary care physician; [name], MD

## 2015-11-02 NOTE — Plan of Care (Signed)
Problem: Education: Goal: Knowledge of [facility] Education information/materials will improve Outcome: Progressing Problem: Education: Goal: Knowledge of [facility] Education information/materials will improve Outcome: Progressing Pt education done, no question or concerns at this time  Problem: Safety: Goal: Ability to remain free from injury will improve Outcome: Progressing Pt is high falls, bed alarm in use, safety sitter at bedside  Problem: Tissue Perfusion: Goal: Risk factors for ineffective tissue perfusion will decrease Outcome: Progressing Pt had low BP yesterday, NS bolus given, BP meds discontinued, with improvement in BP  Problem: Fluid Volume: Goal: Ability to maintain a balanced intake and output will improve Outcome: Progressing Pt had low BP yesterday, NS bolus given, BP meds discontinued, with improvement in BP  Problem: Nutrition: Goal: Adequate nutrition will be maintained Outcome: Progressing Pt has had poor po intake, pt did drink ensure

## 2015-11-02 NOTE — Progress Notes (Signed)
Nutrition Follow-up  DOCUMENTATION CODES:   Severe malnutrition in context of chronic illness  INTERVENTION:   Meals and Snacks: Cater to patient preferences. CNA Clyde Canterbury agreeable to assist at meal times currently. Will also clarify drinks with lid order to include straws.  Medical Food Supplement Therapy: Recommend continuing Ensure as ordered between meals, will add Mighty Shakes to trays as well.  Coordination of Care: await diet advancement as medically able. Also pt may benefit from regimen, day 6 no BM documented.  NUTRITION DIAGNOSIS:   Malnutrition related to chronic illness as evidenced by percent weight loss, severe depletion of muscle mass, moderate depletion of body fat.  GOAL:   Patient will meet greater than or equal to 90% of their needs  MONITOR:    (Energy Intake, Anthropometrics, Electrolyte and renal Profile, Digestive system)  REASON FOR ASSESSMENT:   Consult Poor PO  ASSESSMENT:   Pt fell per Nsg on 11/12 with laceration to head. Pt since drowsy, unresponsive at times; currently with sitter. Neurology following; per note  AMS secondary to anemia, hyponatremia, FTT and delirium.  Pt scheduled for EGD/colonoscopy 11/15 however cancelled as pt too drowsy to take prep.  Nephrology following for hyponatremia.   Diet Order:  Diet full liquid Room service appropriate?: Yes; Fluid consistency:: Thin    Current Nutrition: Pt drank apple juice with Patient Services Manager this am on CL tray, pt awaiting FL tray on visit. Pt has been NPO/CL since 11/12.   Gastrointestinal Profile: Last BM: 10/27/2015   Scheduled Medications:  . carbamazepine  200 mg Oral Q1200  . carbamazepine  400 mg Oral QAC breakfast  . carbamazepine  400 mg Oral QHS  . divalproex  1,000 mg Oral QAC breakfast  . divalproex  1,000 mg Oral QHS  . docusate sodium  100 mg Oral BID  . feeding supplement (ENSURE ENLIVE)  1 Bottle Oral BID BM  . OLANZapine  5 mg Oral QHS  . pantoprazole  40  mg Oral Daily  . sodium chloride  3 mL Intravenous Q12H    Electrolyte/Renal Profile and Glucose Profile:   Recent Labs Lab 10/31/15 0455 11/01/15 0519  11/02/15 0230 11/02/15 0548 11/02/15 0846  NA 125* 123*  < > 131* 132* 131*  K 4.1 4.1  --   --  4.4  --   CL 90* 92*  --   --  100*  --   CO2 27 26  --   --  26  --   BUN 25* 23*  --   --  21*  --   CREATININE 1.04 0.97  --   --  1.10  --   CALCIUM 8.6* 8.4*  --   --  8.6*  --   GLUCOSE 108* 87  --   --  89  --   < > = values in this interval not displayed. Protein Profile:   Recent Labs Lab 10/28/15 0503  ALBUMIN 2.8*   Nutritional Anemia Profile:  CBC Latest Ref Rng 11/02/2015 10/31/2015 10/30/2015  WBC 3.8 - 10.6 K/uL 3.9 3.8 2.6(L)  Hemoglobin 13.0 - 18.0 g/dL 9.2(L) 10.2(L) 9.5(L)  Hematocrit 40.0 - 52.0 % 27.4(L) 29.2(L) 27.5(L)  Platelets 150 - 440 K/uL 122(L) 149(L) 143(L)    Weight Trend since Admission: Filed Weights   10/31/15 0500 11/01/15 0430 11/02/15 0500  Weight: 124 lb 9.6 oz (56.518 kg) 127 lb 14.4 oz (58.015 kg) 126 lb 14.4 oz (57.561 kg)     BMI:  Body mass index  is 22.48 kg/(m^2).  Estimated Nutritional Needs:   Kcal:  BEE: 1280kcals, TEE; (IF 1.1-1.3)(AF 1.2) 1690-2000kcals  Protein:  44-56g protein (0.8-1.0g/kg)  Fluid:  1400-1663mL of fluid (25-59mL/kg)  EDUCATION NEEDS:   No education needs identified at this time   [place], RD, LDN Pager [phone]

## 2015-11-02 NOTE — Progress Notes (Signed)
[facility]  ROUNDING NOTE   Subjective:  Na currently 131. Improved significantly. Tolvaptan has been held.   A bit more arousable than before.   Objective:  Vital signs in last 24 hours:  Temp:  [97.7 F (36.5 C)-98.5 F (36.9 C)] 97.7 F (36.5 C) (11/16 1327) Pulse Rate:  [62-71] 71 (11/16 1327) Resp:  [16-20] 20 (11/16 1327) BP: (75-128)/(43-69) 120/67 mmHg (11/16 1327) SpO2:  [97 %-100 %] 100 % (11/16 1327) Weight:  [57.561 kg (126 lb 14.4 oz)] 57.561 kg (126 lb 14.4 oz) (11/16 0500)  Weight change: -0.454 kg (-1 lb) Filed Weights   10/31/15 0500 11/01/15 0430 11/02/15 0500  Weight: 56.518 kg (124 lb 9.6 oz) 58.015 kg (127 lb 14.4 oz) 57.561 kg (126 lb 14.4 oz)    Intake/Output: I/O last 3 completed shifts: In: 230 [P.O.:230] Out: 300 [Urine:300]   Intake/Output this shift:     Physical Exam: General: Somnolent but arousable.   Head: Laceration on forehead covered with dressing. Moist oral mucosal membranes  Eyes: Anicteric  Neck: Supple, trachea midline  Lungs:  Clear to auscultation  Heart: Regular rate and rhythm, no rubs  Abdomen:  Soft, nontender, BS present  Extremities:  trace peripheral edema.  Neurologic: Not following commands, somnolent, but arousable.  Skin: Forehead laceration       Basic Metabolic Panel:  Recent Labs Lab 10/29/15 0525  10/30/15 0518 10/31/15 0455 11/01/15 0519 11/01/15 1611 11/01/15 2125 11/02/15 0230 11/02/15 0548 11/02/15 0846  NA 127*  < > 126* 125* 123* 124* 129* 131* 132* 131*  K 4.3  --  3.7 4.1 4.1  --   --   --  4.4  --   CL 92*  --  90* 90* 92*  --   --   --  100*  --   CO2 28  --  $R'26 27 26  'yp$ --   --   --  26  --   GLUCOSE 92  --  93 108* 87  --   --   --  89  --   BUN 23*  --  23* 25* 23*  --   --   --  21*  --   CREATININE 1.12  --  1.07 1.04 0.97  --   --   --  1.10  --   CALCIUM 8.9  --  9.0 8.6* 8.4*  --   --   --  8.6*  --   < > = values in this interval not displayed.  Liver  Function Tests:  Recent Labs Lab 10/28/15 0503  AST 20  ALT 20  ALKPHOS 59  BILITOT 0.7  PROT 5.2*  ALBUMIN 2.8*   No results for input(s): LIPASE, AMYLASE in the last 168 hours. No results for input(s): AMMONIA in the last 168 hours.  CBC:  Recent Labs Lab 10/28/15 0503 10/29/15 0525 10/30/15 0518 10/31/15 0455 11/02/15 0548  WBC 2.5* 3.1* 2.6* 3.8 3.9  HGB 8.3* 8.9* 9.5* 10.2* 9.2*  HCT 23.7* 25.0* 27.5* 29.2* 27.4*  MCV 97.3 97.1 98.3 98.9 100.3*  PLT 135* 137* 143* 149* 122*    Cardiac Enzymes:  Recent Labs Lab 10/27/15 0703  TROPONINI <0.03    BNP: Invalid input(s): POCBNP  CBG:  Recent Labs Lab 10/28/15 1557 10/28/15 1626  GLUCAP 87 107*    Microbiology: Results for orders placed or performed in visit on 11/26/13  Urine culture     Status: None   Collection Time: 11/27/13  5:05 AM  Result Value Ref Range Status   Micro Text Report   Final       SOURCE: CLEAN CATCH    COMMENT                   MIXED BACTERIAL ORGANISMS   COMMENT                   RESULTS SUGGESTIVE OF CONTAMINATION   ANTIBIOTIC                                                      Clostridium Difficile [facility])     Status: None   Collection Time: 11/29/13  9:51 AM  Result Value Ref Range Status   Micro Text Report   Final       C.DIFFICILE ANTIGEN       C.DIFFICILE GDH ANTIGEN : NEGATIVE   C.DIFFICILE TOXIN A/B     C.DIFFICILE TOXINS A AND B : NEGATIVE   INTERPRETATION            Negative for C. difficile.    ANTIBIOTIC                                                        Coagulation Studies: No results for input(s): LABPROT, INR in the last 72 hours.  Urinalysis: No results for input(s): COLORURINE, LABSPEC, PHURINE, GLUCOSEU, HGBUR, BILIRUBINUR, KETONESUR, PROTEINUR, UROBILINOGEN, NITRITE, LEUKOCYTESUR in the last 72 hours.  Invalid input(s): APPERANCEUR    Imaging: Ct Head Wo Contrast  10/31/2015  CLINICAL DATA:  [facility] last night and Hospital and hit  head. EXAM: CT HEAD WITHOUT CONTRAST TECHNIQUE: Contiguous axial images were obtained from the base of the skull through the vertex without intravenous contrast. COMPARISON:  10/30/2015 FINDINGS: Stable age advanced cerebral atrophy, ventriculomegaly and periventricular white matter disease. No extra-axial fluid collections are identified. No CT findings for acute hemispheric infarction or intracranial hemorrhage. No mass lesions. The brainstem and cerebellum are normal. No acute skull fracture. The paranasal sinuses and mastoid air cells are clear except for a mucous retention cyst in the right frontal sinus. IMPRESSION: No acute intracranial findings. Stable age advanced cerebral atrophy, ventriculomegaly and periventricular white matter disease. Electronically Signed   By: Marijo Sanes M.D.   On: 10/31/2015 17:52     Medications:     . carbamazepine  200 mg Oral Q1200  . carbamazepine  400 mg Oral QAC breakfast  . carbamazepine  400 mg Oral QHS  . divalproex  1,000 mg Oral QAC breakfast  . divalproex  1,000 mg Oral QHS  . docusate sodium  100 mg Oral BID  . feeding supplement (ENSURE ENLIVE)  1 Bottle Oral BID BM  . OLANZapine  5 mg Oral QHS  . pantoprazole  40 mg Oral Daily  . sodium chloride  3 mL Intravenous Q12H   acetaminophen **OR** acetaminophen, butalbital-acetaminophen-caffeine, ondansetron **OR** ondansetron (ZOFRAN) IV  Assessment/ Plan:  54 y.o. male with hypertension, seizure disorder, chronic speech disorder, CKD stage II, anemia of CKD, SHPTH.  1.  Hyponatremia.  Most likely due to SIADH. Serum sodium greatly improve with tolvaptan, further doses have  been held.  Continue to monitor serum Na.   2.  CKD stage II.  Renal function has fluctuated recently, currently EGFR > 60.    3.  Hypertension.  BP currently 120/67.  Currently off all antihypertensives.     LOS: 6 Zymere Patlan 11/16/20163:02 PM

## 2015-11-02 NOTE — Consult Note (Signed)
  Psychiatry: Follow-up for this 54 year old man with failure to thrive and what is looking like even more impairment after his fall. Attempted to interview the patient. He was able to superficially state his name and state where he was currently but he was not able to engage in any real conversation. He kept trying to shake my hand over and over again as if it were almost a repetitive thing and he had forgotten he had done it before. Affect is blank. Thoughts are very empty. He is not able to articulate any kind of understanding of his current condition or plan.  It is possible that it's this point some of this could be depression although his head injury on top of his multiple other medical problems and anemia and failure to thrive could explain what seems almost more like a delirium.  Patient has no understanding of his current medical condition and is unable to display any understanding or plan for his medical care and therefore can be said to have no capacity to make decisions at this point.  Because he is on a couple of antiseizure medicines and all together quite a bit of medicine I think I'm going to not risk starting another medicine just on the off chance that he could have some depression. I will continue however to follow as needed.

## 2015-11-02 NOTE — Progress Notes (Signed)
PT Cancellation Note  Patient Details Name: [name] MRN: [id number] DOB: 06/03/1961   Cancelled Treatment:    Reason Eval/Treat Not Completed: Patient declined, no reason specified  Attempted to see pt this afternoon, he was lethargic but did open his eyes when PT introduced himself.  He did not say anything the entire time, but after marginally acknowledging PT he closed his eyes and "refused" further interaction.   Kreg Shropshire 11/02/2015, 3:05 PM

## 2015-11-03 LAB — BASIC METABOLIC PANEL
ANION GAP: 7 (ref 5–15)
BUN: 19 mg/dL (ref 6–20)
CALCIUM: 9.1 mg/dL (ref 8.9–10.3)
CO2: 28 mmol/L (ref 22–32)
CREATININE: 0.91 mg/dL (ref 0.61–1.24)
Chloride: 97 mmol/L — ABNORMAL LOW (ref 101–111)
GFR calc Af Amer: >60 mL/min (ref >60)
GLUCOSE: 88 mg/dL (ref 65–99)
Potassium: 4.3 mmol/L (ref 3.5–5.1)
Sodium: 132 mmol/L — ABNORMAL LOW (ref 135–145)

## 2015-11-03 LAB — HEPATIC FUNCTION PANEL
ALK PHOS: 77 U/L (ref 38–126)
ALT: 15 U/L — AB (ref 17–63)
AST: 33 U/L (ref 15–41)
Albumin: 2.8 g/dL — ABNORMAL LOW (ref 3.5–5.0)
BILIRUBIN TOTAL: 0.6 mg/dL (ref 0.3–1.2)
Total Protein: 5.3 g/dL — ABNORMAL LOW (ref 6.5–8.1)

## 2015-11-03 LAB — VALPROIC ACID LEVEL: Valproic Acid Lvl: 66 ug/mL (ref 50.0–100.0)

## 2015-11-03 LAB — AMMONIA: AMMONIA: 30 umol/L (ref 9–35)

## 2015-11-03 LAB — VITAMIN B12: Vitamin B-12: 369 pg/mL (ref 180–914)

## 2015-11-03 LAB — FOLATE: Folate: 4.7 ng/mL — ABNORMAL LOW (ref >5.9)

## 2015-11-03 MED ORDER — LACTULOSE 10 GM/15ML PO SOLN
20.0000 g | Freq: Three times a day (TID) | ORAL | Status: DC
  Administered 2015-11-03 – 2015-11-09 (×17): 20 g via ORAL
  Filled 2015-11-03 (×17): qty 30

## 2015-11-03 MED ORDER — LACTULOSE 10 GM/15ML PO SOLN
20.0000 g | Freq: Two times a day (BID) | ORAL | Status: DC
  Administered 2015-11-03: 20 g via ORAL
  Filled 2015-11-03: qty 30

## 2015-11-03 MED ORDER — LEVOCARNITINE 200 MG/ML IV SOLN
1000.0000 mg | Freq: Once | INTRAVENOUS | Status: AC
  Administered 2015-11-03: 1000 mg via INTRAVENOUS
  Filled 2015-11-03: qty 5

## 2015-11-03 MED ORDER — DIVALPROEX SODIUM 500 MG PO DR TAB
750.0000 mg | DELAYED_RELEASE_TABLET | Freq: Two times a day (BID) | ORAL | Status: DC
  Administered 2015-11-03 – 2015-11-18 (×30): 750 mg via ORAL
  Filled 2015-11-03 (×21): qty 1
  Filled 2015-11-03: qty 2
  Filled 2015-11-03 (×9): qty 1

## 2015-11-03 MED ORDER — CARBAMAZEPINE ER 200 MG PO TB12
200.0000 mg | ORAL_TABLET | Freq: Two times a day (BID) | ORAL | Status: DC
  Administered 2015-11-03 – 2015-11-18 (×30): 200 mg via ORAL
  Filled 2015-11-03 (×32): qty 1

## 2015-11-03 NOTE — Progress Notes (Signed)
[facility]  ROUNDING NOTE   Subjective:  Na stable at 132. Pt was oriented to person, place today. Recognizied me as I've seen him as an outpt.  Still not engaging in any significant conversation however.   Objective:  Vital signs in last 24 hours:  Temp:  [97.5 F (36.4 C)-98.1 F (36.7 C)] 97.7 F (36.5 C) (11/17 1310) Pulse Rate:  [62-72] 72 (11/17 1310) Resp:  [18-19] 19 (11/17 1310) BP: (93-124)/(40-62) 93/40 mmHg (11/17 1310) SpO2:  [100 %] 100 % (11/17 1310) Weight:  [57.879 kg (127 lb 9.6 oz)] 57.879 kg (127 lb 9.6 oz) (11/17 0500)  Weight change: 0.318 kg (11.2 oz) Filed Weights   11/01/15 0430 11/02/15 0500 11/03/15 0500  Weight: 58.015 kg (127 lb 14.4 oz) 57.561 kg (126 lb 14.4 oz) 57.879 kg (127 lb 9.6 oz)    Intake/Output: I/O last 3 completed shifts: In: 240 [P.O.:240] Out: -    Intake/Output this shift:  Total I/O In: 760 [P.O.:760] Out: -   Physical Exam: General: Somnolent but arousable.  Head: Laceration on forehead covered with dressing. Moist oral mucosal membranes  Eyes: Anicteric  Neck: Supple, trachea midline  Lungs:  Clear to auscultation  Heart: Regular rate and rhythm, no rubs  Abdomen:  Soft, nontender, BS present  Extremities:  trace peripheral edema.  Neurologic: Somnolent but arousable, when awake was oriented to person/place  Skin: Forehead laceration       Basic Metabolic Panel:  Recent Labs Lab 10/30/15 0518 10/31/15 0455 11/01/15 0519  11/02/15 0230 11/02/15 0548 11/02/15 0846 11/02/15 1429 11/03/15 0605  NA 126* 125* 123*  < > 131* 132* 131* 132* 132*  K 3.7 4.1 4.1  --   --  4.4  --   --  4.3  CL 90* 90* 92*  --   --  100*  --   --  97*  CO2 26 27 26   --   --  26  --   --  28  GLUCOSE 93 108* 87  --   --  89  --   --  88  BUN 23* 25* 23*  --   --  21*  --   --  19  CREATININE 1.07 1.04 0.97  --   --  1.10  --   --  0.91  CALCIUM 9.0 8.6* 8.4*  --   --  8.6*  --   --  9.1  < > = values in this  interval not displayed.  Liver Function Tests:  Recent Labs Lab 10/28/15 0503 11/03/15 0605  AST 20 33  ALT 20 15*  ALKPHOS 59 77  BILITOT 0.7 0.6  PROT 5.2* 5.3*  ALBUMIN 2.8* 2.8*   No results for input(s): LIPASE, AMYLASE in the last 168 hours. No results for input(s): AMMONIA in the last 168 hours.  CBC:  Recent Labs Lab 10/28/15 0503 10/29/15 0525 10/30/15 0518 10/31/15 0455 11/02/15 0548  WBC 2.5* 3.1* 2.6* 3.8 3.9  HGB 8.3* 8.9* 9.5* 10.2* 9.2*  HCT 23.7* 25.0* 27.5* 29.2* 27.4*  MCV 97.3 97.1 98.3 98.9 100.3*  PLT 135* 137* 143* 149* 122*    Cardiac Enzymes: No results for input(s): CKTOTAL, CKMB, CKMBINDEX, TROPONINI in the last 168 hours.  BNP: Invalid input(s): POCBNP  CBG:  Recent Labs Lab 10/28/15 1557 10/28/15 1626  GLUCAP 87 107*    Microbiology: Results for orders placed or performed in visit on 11/26/13  Urine culture     Status: None  Collection Time: 11/27/13  5:05 AM  Result Value Ref Range Status   Micro Text Report   Final       SOURCE: CLEAN CATCH    COMMENT                   MIXED BACTERIAL ORGANISMS   COMMENT                   RESULTS SUGGESTIVE OF CONTAMINATION   ANTIBIOTIC                                                      Clostridium Difficile [facility])     Status: None   Collection Time: 11/29/13  9:51 AM  Result Value Ref Range Status   Micro Text Report   Final       C.DIFFICILE ANTIGEN       C.DIFFICILE GDH ANTIGEN : NEGATIVE   C.DIFFICILE TOXIN A/B     C.DIFFICILE TOXINS A AND B : NEGATIVE   INTERPRETATION            Negative for C. difficile.    ANTIBIOTIC                                                        Coagulation Studies: No results for input(s): LABPROT, INR in the last 72 hours.  Urinalysis: No results for input(s): COLORURINE, LABSPEC, PHURINE, GLUCOSEU, HGBUR, BILIRUBINUR, KETONESUR, PROTEINUR, UROBILINOGEN, NITRITE, LEUKOCYTESUR in the last 72 hours.  Invalid input(s): APPERANCEUR     Imaging: No results found.   Medications:     . carbamazepine  200 mg Oral BID  . divalproex  750 mg Oral Q12H  . docusate sodium  100 mg Oral BID  . feeding supplement (ENSURE ENLIVE)  1 Bottle Oral BID BM  . lactulose  20 g Oral BID  . OLANZapine  5 mg Oral QHS  . pantoprazole  40 mg Oral Daily  . sodium chloride  3 mL Intravenous Q12H   acetaminophen **OR** acetaminophen, butalbital-acetaminophen-caffeine, ondansetron **OR** ondansetron (ZOFRAN) IV  Assessment/ Plan:  54 y.o. male with hypertension, seizure disorder, chronic speech disorder, CKD stage II, anemia of CKD, SHPTH.  1.  Hyponatremia. 2.  CKD stage II 3.  Hypertension.  Plan:  Mental status was slightly improved today but still quite a bit off from his normal baseline as we follow him as an outpt.  Hyponatremia significantly improved with 1 dose of tolvaptan.  Further doses held.  Renal function appears to be stable as well.  Will likely end up requiring placement given his current mental status.  Psych and neuro input appreciated.    LOS: 7 [name] 11/17/20161:49 PM

## 2015-11-03 NOTE — Progress Notes (Signed)
NEUROLOGY  S:  Asked to f/u on Dr. Earnest Conroy, pt due to seizure medications and continued confusion:  Pt reports not having a seizure in the past 5 years but does state a 20 + year history of seizures  ROS neg x 8 systems except for mild headache  O:  97.5  124/62    62    18 Nl weight, NAD Normocephalic, oropharynx clear Supple, no LAD CTA B, no wheezing RRR, + murmur No C/C/E  Alert but oriented x 2 not time, mild dysarthria PERRLA, EOMI, face symmetric 4/5 B, + myoclonus  1.  Encephalopathy-  Multifactorial to include hyponatremia and probable liver dysfunction from seizure medications 2.  Epilepsy-  Sounds controlled now 3.  Hyponatremia-  Likely due to tegretol -  Decreased Depakote to 750mg  BID -  One time dose of L-carnitine 1gm IV now -  Start lactulose 20gm BID -  Decrease tegretol to 200mg  BID and plan on weaning -  Hyponatremia per nephrology -  Will EEG if no improvement -  Will follow

## 2015-11-03 NOTE — Plan of Care (Signed)
Problem: Fluid Volume: Goal: Ability to maintain a balanced intake and output will improve Outcome: Progressing Pt with no complaints of pain. Incontinent of urine. No stool so far this shift to send for testing. Flat affect. No impulsive behavior. PRN sitter not needed. Palliative consult complete. No family involved in care. Social work consult pending.

## 2015-11-03 NOTE — Progress Notes (Signed)
[place] [place] NAME: [name]    MR#:  [id number]  DATE OF BIRTH:  1961/09/09   CHIEF COMPLAINT:   SUBJECTIVE:  Patient is awake, did not want to answer questions.    DRUG ALLERGIES:  No Known Allergies  VITALS:  Blood pressure 112/46, pulse 72, temperature 97.7 F (36.5 C), temperature source Oral, resp. rate 19, height 5\' 3"  (1.6 m), weight 57.879 kg (127 lb 9.6 oz), SpO2 100 %.  PHYSICAL EXAMINATION:  GENERAL:  54 y.o.-year-old patient lying in the bed with no acute distress.  EYES: Pupils equal, round, reactive to light and accommodation. No scleral icterus.  HEENT: dressings on head- after laceration last evening, normocephalic. NECK:  Supple, no jugular venous distention. No thyroid enlargement, no tenderness.  LUNGS: Normal breath sounds bilaterally, no wheezing, rales,rhonchi or crepitation. No use of accessory muscles of respiration.  CARDIOVASCULAR: S1, S2 normal. No murmurs, rubs, or gallops.  ABDOMEN: Soft, nontender, nondistended. Bowel sounds present. No organomegaly or mass.  EXTREMITIES: No pedal edema, cyanosis, or clubbing.  NEUROLOGIC: not follow commands. PSYCHIATRIC: The patient is awake, alert, times 3. SKIN: No obvious rash, lesion, or ulcer.    LABORATORY PANEL:   CBC  Recent Labs Lab 11/02/15 0548  WBC 3.9  HGB 9.2*  HCT 27.4*  PLT 122*   ------------------------------------------------------------------------------------------------------------------  Chemistries   Recent Labs Lab 11/03/15 0605  NA 132*  K 4.3  CL 97*  CO2 28  GLUCOSE 88  BUN 19  CREATININE 0.91  CALCIUM 9.1  AST 33  ALT 15*  ALKPHOS 77  BILITOT 0.6   ------------------------------------------------------------------------------------------------------------------  Cardiac Enzymes No results for input(s): TROPONINI in the last 168  hours. ------------------------------------------------------------------------------------------------------------------  RADIOLOGY:  No results found.   ASSESSMENT AND PLAN:   #Near syncope secondary to Symptomatic anemia with chronic abdominal pain Transfused 1 unit of blood and repeat hemoglobin at 8.3--9.5  Patient is agreeable for  EGD and colonoscopy as recommended by gastroenterology, which was scheduled for 10/31/15 a.m. by Dr. Allen Norris. But as he is more drowsy- could not do prep- so it was d/ced. Stool for occult blood is negative in the ED. Continue Protonix  CT chest, abdomen and pelvis are negative except for lower rib fractures,   He have weakness and frequent falls- had one in hospital 10/30/15 with lacerations on head.  CT head negative.  #. Failure to thrive-unintentional weight loss of 30 pounds in 3 months CT head is negative for any metastases. CT chest, abdomen and pelvis -no malignancy  Patient is scheduled for EGD and colonoscopy on Tuesday a.m as above. If not clear source, then likely will be due to psych issues. Palliative care for repeated admissions for similar reason.  # metabolic encephalopathy - altered mental status   He had worsening hyponatremia- CT head is negative.  Normal tegretol and depakot level. Sodium level is improving. But the patient's mental status fluctuate. Slightly improved today.  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 Restrict by mouth fluid intake to 1200 ml Was on samsca, which was discontinued by Dr. Holley Raring this morning. Na up to 132.   #Partially healed lower rib fractures.  #. Essential tremors-continue home medication propranolol  #. Hypertension-continue amlodipine and ACE inhibitor  #. History of seizures  Resumed his home medication Tegretol and Depakote, but patient is with SIADH  neurology is  consulted regarding alternative medication choices for his seizures .   depakot level was high on 11/12- Decreased Depakote to 750mg  BID. Decrease tegretol to 200mg  BID and plan on weaning.  Start lactulose 20gm BID per Dr. Tamala Julian, neurologist.   He has no capacity to make decisions at this point per Dr. Weber Cooks. No family member or POA. It is difficult to get placement per SW and CM.  All the records are reviewed and case discussed with Care Management/Social Workerr. Greater than 50% time was spent on coordination of care and face-to-face counseling.  CODE STATUS: Full code  TOTAL TIME TAKING CARE OF THIS PATIENT: 39  minutes.   POSSIBLE D/C IN ? DAYS, DEPENDING ON CLINICAL CONDITION.   Demetrios Loll M.D on 11/03/2015 at 4:37 PM  Between 7am to 6pm - Pager - [phone] After 6pm go to [email or web address] - password [email or web address] [facility]  Office  [phone]  CC: Primary care physician; [name], MD

## 2015-11-03 NOTE — Plan of Care (Signed)
Problem: Safety: Goal: Ability to remain free from injury will improve Outcome: Progressing High fall risk precautions maintained, pt remained free of injury.   Problem: Tissue Perfusion: Goal: Risk factors for ineffective tissue perfusion will decrease Outcome: Progressing Pt refuses SCD's. ROM performed.   Problem: Fluid Volume: Goal: Ability to maintain a balanced intake and output will improve Outcome: Progressing VSS. Fluids encouraged. Incontinent. Pt voids without difficulty.  Problem: Nutrition: Goal: Adequate nutrition will be maintained Outcome: Progressing Pt ate 80% of breakfast, but poor appetite at lunch and dinner. Ensure given. No stool since NOV 10th, MD notified lactulose ordered and given. No BM yet.

## 2015-11-03 NOTE — NC FL2 (Signed)
[place] LEVEL OF CARE SCREENING TOOL     IDENTIFICATION  Patient Name: [name] Birthdate: Sep 08, 1961 Sex: male Admission Date (Current Location): 10/27/2015  [place] and [place] Number: [facility] and Address:  [facility], [address], [place], [place] [address]      Provider Number: [id number]  Attending Physician Name and Address:  Demetrios Loll, MD  Relative Name and Phone Number:       Current Level of Care: Hospital Recommended Level of Care: [place] Prior Approval Number:    Date Approved/Denied:   PASRR Number:    Discharge Plan: SNF    Current Diagnoses: Patient Active Problem List   Diagnosis Date Noted  . Acute delirium 10/31/2015  . Fall   . Lightheadedness   . Laceration of scalp   . Near syncope 10/27/2015  . Poverty 10/27/2015  . Symptomatic anemia   . Early satiety   . FTT (failure to thrive) in adult   . Protein-calorie malnutrition, severe ([place]) 09/07/2015  . Hyponatremia 09/05/2015  . Elevated troponin 09/05/2015  . Hypokalemia 09/05/2015  . Leukopenia 09/05/2015  . Renal insufficiency 09/05/2015  . Tremor, essential 07/15/2015  . Essential hypertension 07/15/2015  . Chronic renal insufficiency 07/15/2015  . Leg swelling 07/15/2015  . Chest pain 04/17/2015    Orientation ACTIVITIES/SOCIAL BLADDER RESPIRATION    Self  Passive Incontinent Normal  BEHAVIORAL SYMPTOMS/MOOD NEUROLOGICAL BOWEL NUTRITION STATUS    Convulsions/Seizures (history) Continent Diet (Full Liquid Diet, Thin Liquids (Mighty Shakes, Magic Cup; please use straws))  PHYSICIAN VISITS COMMUNICATION OF NEEDS Height & Weight Skin  Over 180 days Verbally 5\' 3"  (160 cm) 127 lbs. Normal          AMBULATORY STATUS RESPIRATION    Assist extensive Normal      Personal Care Assistance Level of Assistance  Total care       Total Care Assistance: Maximum assistance    Functional Limitations  Info                SPECIAL CARE FACTORS FREQUENCY                      Additional Factors Info  Code Status, Allergies, Psychotropic Code Status Info: Full Code Allergies Info: Allergies           Current Medications (11/03/2015): Current Facility-Administered Medications  Medication Dose Route Frequency Provider Last Rate Last Dose  . acetaminophen (TYLENOL) tablet 650 mg  650 mg Oral Q6H PRN Nicholes Mango, MD   650 mg at 10/31/15 0754   Or  . acetaminophen (TYLENOL) suppository 650 mg  650 mg Rectal Q6H PRN Nicholes Mango, MD      . butalbital-acetaminophen-caffeine (FIORICET, ESGIC) 50-325-40 MG per tablet 1-2 tablet  1-2 tablet Oral Q4H PRN Nicholes Mango, MD      . carbamazepine (TEGRETOL XR) 12 hr tablet 200 mg  200 mg Oral BID Valora Corporal, MD   200 mg at 11/03/15 1022  . divalproex (DEPAKOTE) DR tablet 750 mg  750 mg Oral Q12H Valora Corporal, MD   750 mg at 11/03/15 1023  . docusate sodium (COLACE) capsule 100 mg  100 mg Oral BID Nicholes Mango, MD   100 mg at 11/03/15 0848  . feeding supplement (ENSURE ENLIVE) (ENSURE ENLIVE) liquid 237 mL  1 Bottle Oral BID BM Aruna Gouru, MD   237 mL at 11/03/15 1019  . lactulose (CHRONULAC) 10 GM/15ML solution 20 g  20 g Oral BID Valora Corporal, MD      . levOCARNitine (CARNITOR) 200 MG/ML injection 1,000 mg  1,000 mg Intravenous Once Valora Corporal, MD      . OLANZapine [facility]) tablet 5 mg  5 mg Oral QHS Dewain Penning, MD   5 mg at 11/02/15 2100  . ondansetron (ZOFRAN) tablet 4 mg  4 mg Oral Q6H PRN Nicholes Mango, MD       Or  . ondansetron (ZOFRAN) injection 4 mg  4 mg Intravenous Q6H PRN Aruna Gouru, MD      . pantoprazole (PROTONIX) EC tablet 40 mg  40 mg Oral Daily Nicholes Mango, MD   40 mg at 11/03/15 0848  . sodium chloride 0.9 % injection 3 mL  3 mL Intravenous Q12H Nicholes Mango, MD   3 mL at 11/03/15 0848   Do not use this list as official medication orders. Please verify with discharge summary.  Discharge Medications:    Medication List    ASK your doctor about these medications        amLODipine 5 MG tablet  Commonly known as:  NORVASC  Take 5 mg by mouth daily.     butalbital-acetaminophen-caffeine 50-325-40 MG tablet  Commonly known as:  FIORICET, ESGIC  Take 1-2 tablets by mouth every 4 (four) hours as needed for migraine.     carbamazepine 200 MG tablet  Commonly known as:  TEGRETOL  Take 200-400 mg by mouth 3 (three) times daily. Pt takes two in the morning, one at noon, and two at bedtime.     divalproex 500 MG DR tablet  Commonly known as:  DEPAKOTE  Take 1-2 tablets (500-1,000 mg total) by mouth 3 (three) times daily. Pt takes two in the morning, one at noon, and two at bedtime.     feeding supplement (ENSURE ENLIVE) Liqd  Take 237 mLs by mouth 2 (two) times daily between meals.     lisinopril 20 MG tablet  Commonly known as:  PRINIVIL,ZESTRIL  Take 20 mg by mouth daily.     pantoprazole 40 MG tablet  Commonly known as:  PROTONIX  Take 1 tablet (40 mg total) by mouth daily.     propranolol 20 MG tablet  Commonly known as:  INDERAL  Take 40 mg by mouth 2 (two) times daily.        Relevant Imaging Results:  Relevant Lab Results:  Recent Labs    Additional Information    Darden Dates, LCSW

## 2015-11-03 NOTE — Clinical Social Work Note (Signed)
CSW spoke with Psychiatrist as pt lacks capacity. CSW also spoke with pt. CSW informed him of his need for placement and that CSW was going to contact [place] to assist. CSW will also follow up with DSS for guardianship. CSW called [facility] [place] and left a message requesting a return phone call. CSW will continue to follow.   Darden Dates, MSW, LCSW Clinical Social Worker  [phone]

## 2015-11-03 NOTE — Progress Notes (Signed)
PT Cancellation Note  Patient Details Name: [name] MRN: [id number] DOB: 05-25-1961   Cancelled Treatment:    Reason Eval/Treat Not Completed: Fatigue/lethargy limiting ability to participate.  Pt sleeping upon PT arrival and very difficult to wake.  Pt occasionally opening his eyes but then would close them again and go back to sleep.  Will re-attempt PT session at a later date/time.   Raquel Sarna Margarita Bobrowski 11/03/2015, 3:59 PM Leitha Bleak, [place]

## 2015-11-04 MED ORDER — CYANOCOBALAMIN 1000 MCG/ML IJ SOLN
1000.0000 ug | Freq: Every day | INTRAMUSCULAR | Status: AC
  Administered 2015-11-05 – 2015-11-11 (×7): 1000 ug via SUBCUTANEOUS
  Filled 2015-11-04 (×7): qty 1

## 2015-11-04 NOTE — Progress Notes (Signed)
NEUROLOGY F/U  S: no further seizures, feels good today and more interactive, wants to go home  ROS negative x 8 systems  O: 98.5   108/63    66    18 NAD, nl weight Normocephalic, oropharynx clear Supple, no LAD CTA B, no wheezing RRR, no murmur No C/C/E  A+Ox3, mild dysarthria, nl language PERRLA, EOMI, face symmetric 5-/5 B  EEG normal  A/P: 1. Encephalopathy- improved, Multifactorial to include hyponatremia and probable liver dysfunction from seizure medications 2. Epilepsy- Sounds controlled now 3. Hyponatremia- Likely due to tegretol - continue Depakote to 750mg  BID - continue lactulose 20gm BID until d/c then stop - continue tegretol 200mg  BID - will sign off, please call with questions -  Needs to f/u with [facility] Neuro in 3 months

## 2015-11-04 NOTE — Plan of Care (Signed)
Problem: Safety: Goal: Ability to remain free from injury will improve Outcome: Progressing Pt high fall risk. Prn sitter for safety not needed. Room in close proximity of the nurses station. Toileting needs addressed with rounding.   Problem: Nutrition: Goal: Adequate nutrition will be maintained Outcome: Progressing Pt with no complaints of pain. Incontinent of urine and stool. Lactulose and Docusate given at bedtime (bm x one). Pt still needs stool sample. Pt more alert and talkative.

## 2015-11-04 NOTE — Plan of Care (Signed)
Problem: Safety: Goal: Ability to remain free from injury will improve Outcome: Progressing Pt remained safe during shift.  Bed alarm on and room is near nurse's  Stn.  Pt is much improved today.  More alert and interactive.  Understands he's supposed to call us when he needs something and he successfully did that today.   Problem: Tissue Perfusion: Goal: Risk factors for ineffective tissue perfusion will decrease Outcome: Progressing Pt is hemodynamically stable.   Problem: Fluid Volume: Goal: Ability to maintain a balanced intake and output will improve Outcome: Progressing Pt's PO intake much better today.  He even wanted to have dietary supplements added and they have been. Pt had BM last night - continued on lactulose.

## 2015-11-04 NOTE — Progress Notes (Signed)
[place] [facility] [place] NAME: [name]    MR#:  [id number]  DATE OF BIRTH:  [birth year]   CHIEF COMPLAINT:   SUBJECTIVE:  Patient is awake, alert, no complaints.   Review of systems Review of systems EW OF SYSTEMS:  CONSTITUTIONAL: No fever, fatigue or weakness.  EYES: No blurred or double vision.  EARS, NOSE, AND THROAT: No tinnitus or ear pain.  RESPIRATORY: No cough, shortness of breath, wheezing or hemoptysis.  CARDIOVASCULAR: No chest pain, no orthopnea, edema.  GASTROINTESTINAL: No nausea, vomiting, diarrhea or abdominal pain.  GENITOURINARY: No dysuria, hematuria.  ENDOCRINE: No polyuria, nocturia,  HEMATOLOGY: No anemia, easy bruising or bleeding SKIN: No rash or lesion. MUSCULOSKELETAL: No joint pain or arthritis.  NEUROLOGIC: No tingling, numbness, weakness.  PSYCHIATRY: No anxiety or depression.       DRUG ALLERGIES:  No Known Allergies  VITALS:  Blood pressure 114/62, pulse 83, temperature 98 F (36.7 C), temperature source Oral, resp. rate 20, height 5\' 3"  (1.6 m), weight 58.06 kg (128 lb), SpO2 96 %.  PHYSICAL EXAMINATION:  GENERAL:  54 y.o.-year-old patient lying in the bed with no acute distress.  EYES: Pupils equal, round, reactive to light and accommodation. No scleral icterus.  HEENT: dressings on head- after laceration, normocephalic. Moist oral mucosa and clear pharynx. NECK:  Supple, no jugular venous distention. No thyroid enlargement, no tenderness.  LUNGS: Normal breath sounds bilaterally, no wheezing, rales,rhonchi or crepitation. No use of accessory muscles of respiration.  CARDIOVASCULAR: S1, S2 normal. No murmurs, rubs, or gallops.  ABDOMEN: Soft, nontender, nondistended. Bowel sounds present. No organomegaly or mass.  EXTREMITIES: No pedal edema, cyanosis, or clubbing.  NEUROLOGIC: Cranial nerves II through XII are intact. Muscle strength 4/5 in all extremities. Sensation intact.  Gait not checked.  PSYCHIATRIC: The patient is alert and oriented x 3.  SKIN: No obvious rash, lesion, or ulcer.    LABORATORY PANEL:   CBC  Recent Labs Lab 11/02/15 0548  WBC 3.9  HGB 9.2*  HCT 27.4*  PLT 122*   ------------------------------------------------------------------------------------------------------------------  Chemistries   Recent Labs Lab 11/03/15 0605  NA 132*  K 4.3  CL 97*  CO2 28  GLUCOSE 88  BUN 19  CREATININE 0.91  CALCIUM 9.1  AST 33  ALT 15*  ALKPHOS 77  BILITOT 0.6   ------------------------------------------------------------------------------------------------------------------  Cardiac Enzymes No results for input(s): TROPONINI in the last 168 hours. ------------------------------------------------------------------------------------------------------------------  RADIOLOGY:  No results found.   ASSESSMENT AND PLAN:   #Near syncope secondary to Symptomatic anemia with chronic abdominal pain Transfused 1 unit of blood and repeat hemoglobin is stable.  Patient is agreeable for  EGD and colonoscopy as recommended by gastroenterology, which was scheduled for 10/31/15 a.m. by Dr. Allen Norris. But as he was more drowsy- could not do prep- so it was d/ced. Stool for occult blood is negative in the ED. Continue Protonix  CT chest, abdomen and pelvis are negative except for lower rib fractures,   He have weakness and frequent falls- had one in hospital 10/30/15 with lacerations on head.  CT head negative.  #. Failure to thrive-unintentional weight loss of 30 pounds in 3 months CT head is negative for any metastases. CT chest, abdomen and pelvis -no malignancy  Patient is scheduled for EGD and colonoscopy on Tuesday a.m as above. If not clear source, then likely will be due to psych issues. Palliative care for repeated admissions for similar reason.  # metabolic encephalopathy -  altered mental status   He had worsening hyponatremia- CT  head is negative.  Normal tegretol and depakot level. Sodium level is improving. But the patient's mental status fluctuated.  He is alert, awake and oriented 3 today.  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 Restrict by mouth fluid intake to 1200 ml Was on samsca, which was discontinued by Dr. Holley Raring since  Na  was up to 132.   #Partially healed lower rib fractures.  #. Essential tremors-continue home medication propranolol  #. Hypertension, controled, not on amlodipine and ACE inhibitor  #. History of seizures  Resumed his home medication Tegretol and Depakote, but patient is with SIADH   neurology is consulted regarding alternative medication choices for his seizures .   depakot level was high on 11/12- Decreased Depakote to 750mg  BID. Decrease tegretol to 200mg  BID.  Started lactulose 20gm BID per Dr. Tamala Julian, neurologist.   He has no capacity to make decisions at this point per Dr. Weber Cooks. No family member or POA. It is difficult to get placement per SW and CM.  All the records are reviewed and case discussed with Care Management/Social Workerr. Greater than 50% time was spent on coordination of care and face-to-face counseling.  CODE STATUS: Full code  TOTAL TIME TAKING CARE OF THIS PATIENT: 35  minutes.   POSSIBLE D/C IN ? DAYS, DEPENDING ON CLINICAL CONDITION.   Demetrios Loll M.D on 11/04/2015 at 2:36 PM  Between 7am to 6pm - Pager - [phone] After 6pm go to [email or web address] - password [email or web address] [facility]  Office  [phone]  CC: Primary care physician; [name], MD

## 2015-11-04 NOTE — Consult Note (Signed)
  Psychiatry: Follow-up for this 54 year old man with recent delirium. On interview today the patient appears in some ways better than the last time I saw him. He was alert and oriented to his situation and the place. He was able to hold a more coherent conversation. At the same time he was not able to give a very clear description of his medical problems. He acknowledged that he was not able to ambulate satisfactorily today but is still stating that he thinks he can go home and walk up and down stairs and go buy his own groceries.  Affect slightly constricted. Mood stated as all right. No obvious delusions.  Although he is certainly cognitively improved I still think his judgment about discharge is poor and he does not show very adequate capacity to think through risks of his behavior. No change indicated to any psychiatric medicine. Resolving delirium with some baseline cognitive impairment now.

## 2015-11-04 NOTE — Progress Notes (Signed)
[facility]  ROUNDING NOTE   Subjective:  Patient doing much better this a.m. He is completely awake and alert. He now seems to be at his baseline mental status and is quite conversant today.   Objective:  Vital signs in last 24 hours:  Temp:  [97.7 F (36.5 C)-98.5 F (36.9 C)] 98.5 F (36.9 C) (11/18 0546) Pulse Rate:  [66-72] 66 (11/18 0546) Resp:  [19] 19 (11/17 1310) BP: (93-112)/(40-63) 108/63 mmHg (11/18 0546) SpO2:  [100 %] 100 % (11/18 0546) Weight:  [58.06 kg (128 lb)] 58.06 kg (128 lb) (11/18 0900)  Weight change:  Filed Weights   11/02/15 0500 11/03/15 0500 11/04/15 0900  Weight: 57.561 kg (126 lb 14.4 oz) 57.879 kg (127 lb 9.6 oz) 58.06 kg (128 lb)    Intake/Output: I/O last 3 completed shifts: In: 1320 [P.O.:1320] Out: -    Intake/Output this shift:  Total I/O In: 760 [P.O.:760] Out: 400 [Urine:400]  Physical Exam: General: Awake, alert, no acute distress.  Head: Laceration on forehead covered with dressing. Moist oral mucosal membranes  Eyes: Anicteric  Neck: Supple, trachea midline  Lungs:  Clear to auscultation  Heart: Regular rate and rhythm, no rubs  Abdomen:  Soft, nontender, BS present  Extremities:  trace peripheral edema.  Neurologic: Awake, alert, following commands  Skin: Forehead laceration       Basic Metabolic Panel:  Recent Labs Lab 10/30/15 0518 10/31/15 0455 11/01/15 0519  11/02/15 0230 11/02/15 0548 11/02/15 0846 11/02/15 1429 11/03/15 0605  NA 126* 125* 123*  < > 131* 132* 131* 132* 132*  K 3.7 4.1 4.1  --   --  4.4  --   --  4.3  CL 90* 90* 92*  --   --  100*  --   --  97*  CO2 26 27 26   --   --  26  --   --  28  GLUCOSE 93 108* 87  --   --  89  --   --  88  BUN 23* 25* 23*  --   --  21*  --   --  19  CREATININE 1.07 1.04 0.97  --   --  1.10  --   --  0.91  CALCIUM 9.0 8.6* 8.4*  --   --  8.6*  --   --  9.1  < > = values in this interval not displayed.  Liver Function Tests:  Recent Labs Lab  11/03/15 0605  AST 33  ALT 15*  ALKPHOS 77  BILITOT 0.6  PROT 5.3*  ALBUMIN 2.8*   No results for input(s): LIPASE, AMYLASE in the last 168 hours.  Recent Labs Lab 11/03/15 0957  AMMONIA 30    CBC:  Recent Labs Lab 10/29/15 0525 10/30/15 0518 10/31/15 0455 11/02/15 0548  WBC 3.1* 2.6* 3.8 3.9  HGB 8.9* 9.5* 10.2* 9.2*  HCT 25.0* 27.5* 29.2* 27.4*  MCV 97.1 98.3 98.9 100.3*  PLT 137* 143* 149* 122*    Cardiac Enzymes: No results for input(s): CKTOTAL, CKMB, CKMBINDEX, TROPONINI in the last 168 hours.  BNP: Invalid input(s): POCBNP  CBG:  Recent Labs Lab 10/28/15 1557 10/28/15 1626  GLUCAP 87 107*    Microbiology: Results for orders placed or performed in visit on 11/26/13  Urine culture     Status: None   Collection Time: 11/27/13  5:05 AM  Result Value Ref Range Status   Micro Text Report   Final  SOURCE: CLEAN CATCH    COMMENT                   MIXED BACTERIAL ORGANISMS   COMMENT                   RESULTS SUGGESTIVE OF CONTAMINATION   ANTIBIOTIC                                                      Clostridium Difficile [facility])     Status: None   Collection Time: 11/29/13  9:51 AM  Result Value Ref Range Status   Micro Text Report   Final       C.DIFFICILE ANTIGEN       C.DIFFICILE GDH ANTIGEN : NEGATIVE   C.DIFFICILE TOXIN A/B     C.DIFFICILE TOXINS A AND B : NEGATIVE   INTERPRETATION            Negative for C. difficile.    ANTIBIOTIC                                                        Coagulation Studies: No results for input(s): LABPROT, INR in the last 72 hours.  Urinalysis: No results for input(s): COLORURINE, LABSPEC, PHURINE, GLUCOSEU, HGBUR, BILIRUBINUR, KETONESUR, PROTEINUR, UROBILINOGEN, NITRITE, LEUKOCYTESUR in the last 72 hours.  Invalid input(s): APPERANCEUR    Imaging: No results found.   Medications:     . carbamazepine  200 mg Oral BID  . divalproex  750 mg Oral Q12H  . docusate sodium  100 mg Oral  BID  . feeding supplement (ENSURE ENLIVE)  1 Bottle Oral BID BM  . lactulose  20 g Oral TID  . OLANZapine  5 mg Oral QHS  . pantoprazole  40 mg Oral Daily  . sodium chloride  3 mL Intravenous Q12H   acetaminophen **OR** acetaminophen, butalbital-acetaminophen-caffeine, ondansetron **OR** ondansetron (ZOFRAN) IV  Assessment/ Plan:  54 y.o. male with hypertension, seizure disorder, chronic speech disorder, CKD stage II, anemia of CKD, SHPTH.  1.  Hyponatremia. 2.  CKD stage II 3.  Hypertension. 4.   Altered mental status.  Plan:  Patient is much improved at present.  He appears to be at his baseline mental status.  He understands the circumstances of why he is currently here.  We explained to him that he experienced a fall.  We follow him closely as an outpatientand he was quite conversant today.  Serum sodium has improved significantly.  We will continue to periodically monitor serum electrolytes.   LOS: 8 Jame Seelig 11/18/201611:54 AM

## 2015-11-05 LAB — BASIC METABOLIC PANEL
ANION GAP: 5 (ref 5–15)
BUN: 16 mg/dL (ref 6–20)
CHLORIDE: 92 mmol/L — AB (ref 101–111)
CO2: 31 mmol/L (ref 22–32)
Calcium: 8.7 mg/dL — ABNORMAL LOW (ref 8.9–10.3)
Creatinine, Ser: 0.91 mg/dL (ref 0.61–1.24)
GFR calc Af Amer: >60 mL/min (ref >60)
Glucose, Bld: 88 mg/dL (ref 65–99)
POTASSIUM: 4.7 mmol/L (ref 3.5–5.1)
SODIUM: 128 mmol/L — AB (ref 135–145)

## 2015-11-05 LAB — IRON AND TIBC
IRON: 87 ug/dL (ref 45–182)
SATURATION RATIOS: 31 % (ref 17.9–39.5)
TIBC: 282 ug/dL (ref 250–450)
UIBC: 195 ug/dL

## 2015-11-05 LAB — CBC
HEMATOCRIT: 22.8 % — AB (ref 40.0–52.0)
HEMOGLOBIN: 7.6 g/dL — AB (ref 13.0–18.0)
MCH: 33.3 pg (ref 26.0–34.0)
MCHC: 33.3 g/dL (ref 32.0–36.0)
MCV: 100 fL (ref 80.0–100.0)
Platelets: 92 10*3/uL — ABNORMAL LOW (ref 150–440)
RBC: 2.28 MIL/uL — AB (ref 4.40–5.90)
RDW: 18.1 % — ABNORMAL HIGH (ref 11.5–14.5)
WBC: 2.8 10*3/uL — AB (ref 3.8–10.6)

## 2015-11-05 LAB — FERRITIN: Ferritin: 829 ng/mL — ABNORMAL HIGH (ref 24–336)

## 2015-11-05 LAB — FOLATE: FOLATE: 3.7 ng/mL — AB (ref >5.9)

## 2015-11-05 MED ORDER — SODIUM CHLORIDE 1 G PO TABS
1.0000 g | ORAL_TABLET | Freq: Two times a day (BID) | ORAL | Status: DC
  Administered 2015-11-05 – 2015-11-16 (×23): 1 g via ORAL
  Filled 2015-11-05 (×23): qty 1

## 2015-11-05 MED ORDER — FOLIC ACID 1 MG PO TABS
1.0000 mg | ORAL_TABLET | Freq: Every day | ORAL | Status: DC
  Administered 2015-11-05 – 2015-11-18 (×14): 1 mg via ORAL
  Filled 2015-11-05 (×14): qty 1

## 2015-11-05 MED ORDER — FUROSEMIDE 20 MG PO TABS
20.0000 mg | ORAL_TABLET | Freq: Every day | ORAL | Status: DC
  Administered 2015-11-05 – 2015-11-06 (×2): 20 mg via ORAL
  Filled 2015-11-05 (×2): qty 1

## 2015-11-05 NOTE — Progress Notes (Signed)
[facility]  ROUNDING NOTE   Subjective:  Pt appears to be at baseline mental status. Na a bit lower today. Cannot hold a glass to drink water steadily in his hands.   Objective:  Vital signs in last 24 hours:  Temp:  [97.8 F (36.6 C)-98 F (36.7 C)] 97.8 F (36.6 C) (11/19 0541) Pulse Rate:  [62-83] 65 (11/19 0544) Resp:  [18-20] 18 (11/19 0544) BP: (95-114)/(51-62) 101/54 mmHg (11/19 0544) SpO2:  [96 %-100 %] 100 % (11/19 0544) Weight:  [60.691 kg (133 lb 12.8 oz)] 60.691 kg (133 lb 12.8 oz) (11/19 0500)  Weight change:  Filed Weights   11/03/15 0500 11/04/15 0900 11/05/15 0500  Weight: 57.879 kg (127 lb 9.6 oz) 58.06 kg (128 lb) 60.691 kg (133 lb 12.8 oz)    Intake/Output: I/O last 3 completed shifts: In: [id number] [P.O.:1520; I.V.:3] Out: 1200 [Urine:1200]   Intake/Output this shift:     Physical Exam: General: Awake, alert, no acute distress.  Head: Laceration on forehead covered with dressing. Moist oral mucosal membranes  Eyes: Anicteric  Neck: Supple, trachea midline  Lungs:  Clear to auscultation  Heart: Regular rate and rhythm, no rubs  Abdomen:  Soft, nontender, BS present  Extremities:  trace peripheral edema.  Neurologic: Awake, alert, following commands  Skin: Forehead laceration       Basic Metabolic Panel:  Recent Labs Lab 10/31/15 0455 11/01/15 0519  11/02/15 0548 11/02/15 0846 11/02/15 1429 11/03/15 0605 11/05/15 0355  NA 125* 123*  < > 132* 131* 132* 132* 128*  K 4.1 4.1  --  4.4  --   --  4.3 4.7  CL 90* 92*  --  100*  --   --  97* 92*  CO2 27 26  --  26  --   --  28 31  GLUCOSE 108* 87  --  89  --   --  88 88  BUN 25* 23*  --  21*  --   --  19 16  CREATININE 1.04 0.97  --  1.10  --   --  0.91 0.91  CALCIUM 8.6* 8.4*  --  8.6*  --   --  9.1 8.7*  < > = values in this interval not displayed.  Liver Function Tests:  Recent Labs Lab 11/03/15 0605  AST 33  ALT 15*  ALKPHOS 77  BILITOT 0.6  PROT 5.3*  ALBUMIN 2.8*    No results for input(s): LIPASE, AMYLASE in the last 168 hours.  Recent Labs Lab 11/03/15 0957  AMMONIA 30    CBC:  Recent Labs Lab 10/30/15 0518 10/31/15 0455 11/02/15 0548 11/05/15 0355  WBC 2.6* 3.8 3.9 2.8*  HGB 9.5* 10.2* 9.2* 7.6*  HCT 27.5* 29.2* 27.4* 22.8*  MCV 98.3 98.9 100.3* 100.0  PLT 143* 149* 122* 92*    Cardiac Enzymes: No results for input(s): CKTOTAL, CKMB, CKMBINDEX, TROPONINI in the last 168 hours.  BNP: Invalid input(s): POCBNP  CBG: No results for input(s): GLUCAP in the last 168 hours.  Microbiology: Results for orders placed or performed in visit on 11/26/13  Urine culture     Status: None   Collection Time: 11/27/13  5:05 AM  Result Value Ref Range Status   Micro Text Report   Final       SOURCE: CLEAN CATCH    COMMENT                   MIXED BACTERIAL ORGANISMS   COMMENT  RESULTS SUGGESTIVE OF CONTAMINATION   ANTIBIOTIC                                                      Clostridium Difficile [facility])     Status: None   Collection Time: 11/29/13  9:51 AM  Result Value Ref Range Status   Micro Text Report   Final       C.DIFFICILE ANTIGEN       C.DIFFICILE GDH ANTIGEN : NEGATIVE   C.DIFFICILE TOXIN A/B     C.DIFFICILE TOXINS A AND B : NEGATIVE   INTERPRETATION            Negative for C. difficile.    ANTIBIOTIC                                                        Coagulation Studies: No results for input(s): LABPROT, INR in the last 72 hours.  Urinalysis: No results for input(s): COLORURINE, LABSPEC, PHURINE, GLUCOSEU, HGBUR, BILIRUBINUR, KETONESUR, PROTEINUR, UROBILINOGEN, NITRITE, LEUKOCYTESUR in the last 72 hours.  Invalid input(s): APPERANCEUR    Imaging: No results found.   Medications:     . carbamazepine  200 mg Oral BID  . cyanocobalamin  1,000 mcg Subcutaneous Daily  . divalproex  750 mg Oral Q12H  . docusate sodium  100 mg Oral BID  . feeding supplement (ENSURE ENLIVE)  1 Bottle  Oral BID BM  . furosemide  20 mg Oral Daily  . lactulose  20 g Oral TID  . OLANZapine  5 mg Oral QHS  . pantoprazole  40 mg Oral Daily  . sodium chloride  3 mL Intravenous Q12H  . sodium chloride  1 g Oral BID WC   acetaminophen **OR** acetaminophen, butalbital-acetaminophen-caffeine, ondansetron **OR** ondansetron (ZOFRAN) IV  Assessment/ Plan:  54 y.o. male with hypertension, seizure disorder, chronic speech disorder, CKD stage II, anemia of CKD, SHPTH.  1.  Hyponatremia. 2.  CKD stage II 3.  Hypertension. 4.   Altered mental status.  Plan:  As before, pt's mental status has improved significantly.  He has significant tremor when holding a glass to drink.  Neurology has been following him.  Na a bit lower today.  Will start on lasix and salt tabs today.  Pt had received dosage of tolvaptan earlier.  Continue to monitor sodium on this new regimen.  Will follow with you.  Will likely need placement.    LOS: 9 Rohail Klees 11/19/201610:50 AM

## 2015-11-05 NOTE — Plan of Care (Signed)
Problem: Nutrition: Goal: Adequate nutrition will be maintained Outcome: Progressing Pt is alert and oriented, bedbound, bm throughout shift, stool sample sent for occult blood testing, diet advanced to solid diet, tolerating diet without n/v, sodium serum decreased to 128, receiving sodium tablets. Folate level at 3.7, started on folic acid, hgb decreased to 7.6, family friend visited patient throughout shift, vital signs stable, afebrile, remained free from injury throughout shift, uneventful shift.

## 2015-11-05 NOTE — Plan of Care (Signed)
Problem: Nutrition: Goal: Adequate nutrition will be maintained Outcome: Progressing Pt remained safe during shift.  Bed alarm on and room is near nurse's  Stn.  Pt is much improved today.  More alert and interactive.  Understands he's supposed to call us when he needs something and he successfully has done that. Pt is hemodynamically stable.

## 2015-11-05 NOTE — Progress Notes (Signed)
[place] at [place] NAME: Edan Oltmann    MR#:  [id number]  DATE OF BIRTH:  19-Sep-1961   CHIEF COMPLAINT: Weakness  SUBJECTIVE:  Patient is awake and in good spirits.   REVIEW OF SYSTEMS:  Review of systems  CONSTITUTIONAL: No fever, has weakness.  EYES: No blurred or double vision.  EARS, NOSE, AND THROAT: No tinnitus or ear pain.  RESPIRATORY: No SOB/Cough CARDIOVASCULAR: No chest pain, orthopnea, edema.  GASTROINTESTINAL: No nausea, vomiting, diarrhea or abdominal pain.  GENITOURINARY: No dysuria, hematuria.  ENDOCRINE: No polyuria, nocturia,  HEMATOLOGY: No anemia, easy bruising or bleeding SKIN: No rash or lesion. MUSCULOSKELETAL: No joint pain or arthritis.  NEUROLOGIC: No tingling, numbness, weakness.  PSYCHIATRY: No anxiety or depression.         DRUG ALLERGIES:  No Known Allergies  VITALS:  Blood pressure 116/53, pulse 82, temperature 98.6 F (37 C), temperature source Oral, resp. rate 18, height 5\' 3"  (1.6 m), weight 60.691 kg (133 lb 12.8 oz), SpO2 100 %.  PHYSICAL EXAMINATION:  GENERAL:  54 y.o.-year-old patient lying in the bed with no acute distress.  EYES: Pupils equal, round, reactive to light and accommodation. No scleral icterus.  HEENT: dressings on head- after laceration last evening, normocephalic. NECK:  Supple, no jugular venous distention. No thyroid enlargement, no tenderness.  LUNGS: Normal breath sounds bilaterally, no wheezing, rales,rhonchi or crepitation. No use of accessory muscles of respiration.  CARDIOVASCULAR: S1, S2 normal. No murmurs, rubs, or gallops.  ABDOMEN: Soft, nontender, nondistended. Bowel sounds present. No organomegaly or mass.  EXTREMITIES: No pedal edema, cyanosis, or clubbing.  NEUROLOGIC: not follow commands. PSYCHIATRIC: The patient is awake and alert SKIN: No obvious rash, lesion, or ulcer.    LABORATORY PANEL:   CBC  Recent Labs Lab  11/05/15 0355  WBC 2.8*  HGB 7.6*  HCT 22.8*  PLT 92*   ------------------------------------------------------------------------------------------------------------------  Chemistries   Recent Labs Lab 11/03/15 0605 11/05/15 0355  NA 132* 128*  K 4.3 4.7  CL 97* 92*  CO2 28 31  GLUCOSE 88 88  BUN 19 16  CREATININE 0.91 0.91  CALCIUM 9.1 8.7*  AST 33  --   ALT 15*  --   ALKPHOS 77  --   BILITOT 0.6  --    ------------------------------------------------------------------------------------------------------------------  Cardiac Enzymes No results for input(s): TROPONINI in the last 168 hours. ------------------------------------------------------------------------------------------------------------------  RADIOLOGY:  No results found.   ASSESSMENT AND PLAN:   #Near syncope secondary to Symptomatic anemia with chronic abdominal pain Transfused 1 unit of blood and repeat hemoglobin at 8.3--9.5   Stool for occult blood is negative in the ED. Continue Protonix  CT chest, abdomen and pelvis are negative except for lower rib fractures. Worsening anemia. Will need EGD/Colonoscopy. Gi on board.  # Failure to thrive - unintentional weight loss of 30 pounds in 3 months. CT head is negative for any metastases. CT chest, abdomen and pelvis -no malignancy  Patient is scheduled for EGD and colonoscopy as OP. If not clear source, then likely will be due to psych issues. Palliative care for repeated admissions for similar reason.  # Metabolic encephalopathy - altered mental status   He had worsening hyponatremia- CT head is negative.  Normal tegretol and depakot level.  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 Restrict by mouth fluid intake to 1200  ml Was on samsca, which was discontinued by Dr. Holley Raring this morning. Na up to 132.   # Partially healed lower  rib fractures.  #. Essential tremors-continue home medication propranolol  #. Hypertension continue amlodipine and ACE inhibitor  #. History of seizures On Depakote and Tegretol. Lactulose.   He has no capacity to make decisions at this point per Dr. Weber Cooks. No family member or POA. It is difficult to get placement per SW and CM.  All the records are reviewed and case discussed with Care Management/Social Workerr.  CODE STATUS: Full code  TOTAL TIME TAKING CARE OF THIS PATIENT: 35 minutes.   POSSIBLE D/C IN 2-3 DAYS, DEPENDING ON CLINICAL CONDITION.   Hillary Bow R M.D on 11/05/2015 at 2:22 PM  Between 7am to 6pm - Pager - [phone] After 6pm go to [email or web address] - password [email or web address] [facility]  Office  [phone]  CC: Primary care physician; [name], MD

## 2015-11-06 LAB — BASIC METABOLIC PANEL
ANION GAP: 6 (ref 5–15)
BUN: 16 mg/dL (ref 6–20)
CO2: 30 mmol/L (ref 22–32)
Calcium: 8.5 mg/dL — ABNORMAL LOW (ref 8.9–10.3)
Chloride: 91 mmol/L — ABNORMAL LOW (ref 101–111)
Creatinine, Ser: 0.97 mg/dL (ref 0.61–1.24)
GLUCOSE: 83 mg/dL (ref 65–99)
POTASSIUM: 3.8 mmol/L (ref 3.5–5.1)
Sodium: 127 mmol/L — ABNORMAL LOW (ref 135–145)

## 2015-11-06 LAB — HEMOGLOBIN: HEMOGLOBIN: 7.4 g/dL — AB (ref 13.0–18.0)

## 2015-11-06 MED ORDER — FUROSEMIDE 40 MG PO TABS
40.0000 mg | ORAL_TABLET | Freq: Every day | ORAL | Status: DC
  Administered 2015-11-07: 40 mg via ORAL
  Filled 2015-11-06: qty 1

## 2015-11-06 MED ORDER — FUROSEMIDE 20 MG PO TABS
20.0000 mg | ORAL_TABLET | Freq: Once | ORAL | Status: AC
  Administered 2015-11-06: 20 mg via ORAL
  Filled 2015-11-06: qty 1

## 2015-11-06 NOTE — Progress Notes (Signed)
Pt is alert and oriented, bed bound, VSS, afebrile, wounds cleansed and redressed on head from previous fall, good appetite, sodium serum remains low, continues on sodium tablet, extra dose of po lasix given, consulted with nephrologist, on lactulose, bm throughout shift, hgb remains low, uneventful shift, passed report at 15:00.

## 2015-11-06 NOTE — Progress Notes (Signed)
Took over care of pt from Brandi, RN.  Danielle Antwaine Boomhower, RN 

## 2015-11-06 NOTE — Progress Notes (Signed)
[facility]  ROUNDING NOTE   Subjective:  Na currently 127.   Currently on lasix and salt tabs. Seems to be at baseline mental status now.   Objective:  Vital signs in last 24 hours:  Temp:  [98.1 F (36.7 C)-98.8 F (37.1 C)] 98.1 F (36.7 C) (11/20 0458) Pulse Rate:  [68-75] 68 (11/20 0458) Resp:  [18] 18 (11/20 0458) BP: (90-102)/(43-51) 102/51 mmHg (11/20 0458) SpO2:  [99 %-100 %] 100 % (11/20 0458) Weight:  [59.45 kg (131 lb 1 oz)] 59.45 kg (131 lb 1 oz) (11/20 0654)  Weight change: 1.389 kg (3 lb 1 oz) Filed Weights   11/04/15 0900 11/05/15 0500 11/06/15 0654  Weight: 58.06 kg (128 lb) 60.691 kg (133 lb 12.8 oz) 59.45 kg (131 lb 1 oz)    Intake/Output: I/O last 3 completed shifts: In: 68 [P.O.:480; I.V.:3] Out: 2600 [Urine:2600]   Intake/Output this shift:  Total I/O In: -  Out: 400 [Urine:400]  Physical Exam: General: Awake, alert, no acute distress.  Head: Laceration on forehead covered with dressing. Moist oral mucosal membranes  Eyes: Anicteric  Neck: Supple, trachea midline  Lungs:  Clear to auscultation  Heart: Regular rate and rhythm, no rubs  Abdomen:  Soft, nontender, BS present  Extremities:  trace peripheral edema.  Neurologic: Awake, alert, following commands  Skin: Forehead laceration       Basic Metabolic Panel:  Recent Labs Lab 11/01/15 0519  11/02/15 0548 11/02/15 0846 11/02/15 1429 11/03/15 0605 11/05/15 0355 11/06/15 0433  NA 123*  < > 132* 131* 132* 132* 128* 127*  K 4.1  --  4.4  --   --  4.3 4.7 3.8  CL 92*  --  100*  --   --  97* 92* 91*  CO2 26  --  26  --   --  28 31 30   GLUCOSE 87  --  89  --   --  88 88 83  BUN 23*  --  21*  --   --  19 16 16   CREATININE 0.97  --  1.10  --   --  0.91 0.91 0.97  CALCIUM 8.4*  --  8.6*  --   --  9.1 8.7* 8.5*  < > = values in this interval not displayed.  Liver Function Tests:  Recent Labs Lab 11/03/15 0605  AST 33  ALT 15*  ALKPHOS 77  BILITOT 0.6  PROT 5.3*   ALBUMIN 2.8*   No results for input(s): LIPASE, AMYLASE in the last 168 hours.  Recent Labs Lab 11/03/15 0957  AMMONIA 30    CBC:  Recent Labs Lab 10/31/15 0455 11/02/15 0548 11/05/15 0355 11/06/15 0433  WBC 3.8 3.9 2.8*  --   HGB 10.2* 9.2* 7.6* 7.4*  HCT 29.2* 27.4* 22.8*  --   MCV 98.9 100.3* 100.0  --   PLT 149* 122* 92*  --     Cardiac Enzymes: No results for input(s): CKTOTAL, CKMB, CKMBINDEX, TROPONINI in the last 168 hours.  BNP: Invalid input(s): POCBNP  CBG: No results for input(s): GLUCAP in the last 168 hours.  Microbiology: Results for orders placed or performed in visit on 11/26/13  Urine culture     Status: None   Collection Time: 11/27/13  5:05 AM  Result Value Ref Range Status   Micro Text Report   Final       SOURCE: CLEAN CATCH    COMMENT  MIXED BACTERIAL ORGANISMS   COMMENT                   RESULTS SUGGESTIVE OF CONTAMINATION   ANTIBIOTIC                                                      Clostridium Difficile [facility])     Status: None   Collection Time: 11/29/13  9:51 AM  Result Value Ref Range Status   Micro Text Report   Final       C.DIFFICILE ANTIGEN       C.DIFFICILE GDH ANTIGEN : NEGATIVE   C.DIFFICILE TOXIN A/B     C.DIFFICILE TOXINS A AND B : NEGATIVE   INTERPRETATION            Negative for C. difficile.    ANTIBIOTIC                                                        Coagulation Studies: No results for input(s): LABPROT, INR in the last 72 hours.  Urinalysis: No results for input(s): COLORURINE, LABSPEC, PHURINE, GLUCOSEU, HGBUR, BILIRUBINUR, KETONESUR, PROTEINUR, UROBILINOGEN, NITRITE, LEUKOCYTESUR in the last 72 hours.  Invalid input(s): APPERANCEUR    Imaging: No results found.   Medications:     . carbamazepine  200 mg Oral BID  . cyanocobalamin  1,000 mcg Subcutaneous Daily  . divalproex  750 mg Oral Q12H  . docusate sodium  100 mg Oral BID  . feeding supplement (ENSURE  ENLIVE)  1 Bottle Oral BID BM  . folic acid  1 mg Oral Daily  . furosemide  20 mg Oral Once  . [START ON 11/07/2015] furosemide  40 mg Oral Daily  . lactulose  20 g Oral TID  . OLANZapine  5 mg Oral QHS  . pantoprazole  40 mg Oral Daily  . sodium chloride  3 mL Intravenous Q12H  . sodium chloride  1 g Oral BID WC   acetaminophen **OR** acetaminophen, butalbital-acetaminophen-caffeine, ondansetron **OR** ondansetron (ZOFRAN) IV  Assessment/ Plan:  54 y.o. male with hypertension, seizure disorder, chronic speech disorder, CKD stage II, anemia of CKD, SHPTH.  1.  Hyponatremia. 2.  CKD stage II 3.  Hypertension. 4.   Altered mental status.  Plan:  Will place on 1262mL fluid restriction.  Otherwise continue salt tabs and lasix for now.  Pt previously responded very well to one dose of tolvaptan.  Lasix being increased to 40mg  po daily.  Will continue to follow progress, pt will most likely require placement.    LOS: [place], Jackelin Correia 11/20/201612:58 PM

## 2015-11-06 NOTE — Plan of Care (Signed)
Problem: Safety: Goal: Ability to remain free from injury will improve Outcome: Progressing Patient calm and cooperative this shift;slept at short intervals. No episodes of impulsiveness noted.

## 2015-11-06 NOTE — Progress Notes (Signed)
[place] [facility] [place] NAME: [name]    MR#:  [id number]  DATE OF BIRTH:  [birth year]   CHIEF COMPLAINT: Weakness  SUBJECTIVE:  Patient is awake and in good spirits. No pain. Uses walker at home   REVIEW OF SYSTEMS:  Review of systems  CONSTITUTIONAL: No fever, has weakness.  EYES: No blurred or double vision.  EARS, NOSE, AND THROAT: No tinnitus or ear pain.  RESPIRATORY: No SOB/Cough CARDIOVASCULAR: No chest pain, orthopnea, edema.  GASTROINTESTINAL: No nausea, vomiting, diarrhea or abdominal pain.  GENITOURINARY: No dysuria, hematuria.  ENDOCRINE: No polyuria, nocturia,  HEMATOLOGY: No anemia, easy bruising or bleeding SKIN: No rash or lesion. MUSCULOSKELETAL: No joint pain or arthritis.  NEUROLOGIC: No tingling, numbness, weakness.  PSYCHIATRY: No anxiety or depression.         DRUG ALLERGIES:  No Known Allergies  VITALS:  Blood pressure 102/51, pulse 68, temperature 98.1 F (36.7 C), temperature source Oral, resp. rate 18, height 5\' 3"  (1.6 m), weight 59.45 kg (131 lb 1 oz), SpO2 100 %.  PHYSICAL EXAMINATION:  GENERAL:  54 y.o.-year-old patient lying in the bed with no acute distress.  EYES: Pupils equal, round, reactive to light and accommodation. No scleral icterus.  HEENT: dressings on head- after laceration last evening, normocephalic. NECK:  Supple, no jugular venous distention. No thyroid enlargement, no tenderness.  LUNGS: Normal breath sounds bilaterally, no wheezing, rales,rhonchi or crepitation. No use of accessory muscles of respiration.  CARDIOVASCULAR: S1, S2 normal. No murmurs, rubs, or gallops.  ABDOMEN: Soft, nontender, nondistended. Bowel sounds present. No organomegaly or mass.  EXTREMITIES: No pedal edema, cyanosis, or clubbing.  NEUROLOGIC: not follow commands. PSYCHIATRIC: The patient is awake and alert SKIN: No obvious rash, lesion, or ulcer.    LABORATORY PANEL:    CBC  Recent Labs Lab 11/05/15 0355 11/06/15 0433  WBC 2.8*  --   HGB 7.6* 7.4*  HCT 22.8*  --   PLT 92*  --    ------------------------------------------------------------------------------------------------------------------  Chemistries   Recent Labs Lab 11/03/15 0605  11/06/15 0433  NA 132*  < > 127*  K 4.3  < > 3.8  CL 97*  < > 91*  CO2 28  < > 30  GLUCOSE 88  < > 83  BUN 19  < > 16  CREATININE 0.91  < > 0.97  CALCIUM 9.1  < > 8.5*  AST 33  --   --   ALT 15*  --   --   ALKPHOS 77  --   --   BILITOT 0.6  --   --   < > = values in this interval not displayed. ------------------------------------------------------------------------------------------------------------------  Cardiac Enzymes No results for input(s): TROPONINI in the last 168 hours. ------------------------------------------------------------------------------------------------------------------  RADIOLOGY:  No results found.   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 Holley Raring. Na trending down. Discussed with Dr. Holley Raring. On lasix and salt tabs. Start 1200 ml fluid restriction.  #Near syncope secondary to Symptomatic anemia with chronic abdominal pain Transfused 1 unit of blood and repeat hemoglobin at 8.3--9.5   Stool for occult blood is negative in the ED. Continue Protonix  CT chest, abdomen and pelvis are negative except for lower rib fractures. Worsening anemia. Will need EGD/Colonoscopy. GiI on board.  # B12 and Folate deficiency. Replacement started  #  Failure to thrive - unintentional weight loss of 30 pounds in 3 months. CT head is negative for any metastases. CT chest, abdomen and pelvis -no malignancy  Patient is scheduled for EGD and colonoscopy as OP. If not clear source, then likely will be due to psych issues.  # Metabolic  encephalopathy - altered mental status   He had worsening hyponatremia- CT head is negative.  Normal tegretol and depakot level.  # Partially healed lower rib fractures.  #. Essential tremors-continue home medication propranolol  #. Hypertension continue amlodipine and ACE inhibitor  #. History of seizures On Depakote and Tegretol. Lactulose.   He has no capacity to make decisions at this point per Dr. Weber Cooks. No family member or POA. It is difficult to get placement per SW and CM.  All the records are reviewed and case discussed with Care Management/Social Workerr.  CODE STATUS: Full code  TOTAL TIME TAKING CARE OF THIS PATIENT: 35 minutes.   POSSIBLE D/C IN 2-3 DAYS, DEPENDING ON CLINICAL CONDITION.   Hillary Bow R M.D on 11/06/2015 at 11:47 AM  Between 7am to 6pm - Pager - [phone] After 6pm go to [email or web address] - password [email or web address] [facility]  Office  [phone]  CC: Primary care physician; [name], MD

## 2015-11-07 DIAGNOSIS — R4182 Altered mental status, unspecified: Secondary | ICD-10-CM

## 2015-11-07 DIAGNOSIS — N182 Chronic kidney disease, stage 2 (mild): Secondary | ICD-10-CM

## 2015-11-07 DIAGNOSIS — Z9181 History of falling: Secondary | ICD-10-CM

## 2015-11-07 DIAGNOSIS — G250 Essential tremor: Secondary | ICD-10-CM

## 2015-11-07 DIAGNOSIS — I129 Hypertensive chronic kidney disease with stage 1 through stage 4 chronic kidney disease, or unspecified chronic kidney disease: Secondary | ICD-10-CM

## 2015-11-07 DIAGNOSIS — R55 Syncope and collapse: Secondary | ICD-10-CM

## 2015-11-07 LAB — CBC WITH DIFFERENTIAL/PLATELET
BASOS ABS: 0 10*3/uL (ref 0–0.1)
BASOS PCT: 1 %
Eosinophils Absolute: 0 10*3/uL (ref 0–0.7)
Eosinophils Relative: 1 %
HEMATOCRIT: 22.9 % — AB (ref 40.0–52.0)
HEMOGLOBIN: 7.8 g/dL — AB (ref 13.0–18.0)
LYMPHS PCT: 27 %
Lymphs Abs: 0.8 10*3/uL — ABNORMAL LOW (ref 1.0–3.6)
MCH: 34.3 pg — ABNORMAL HIGH (ref 26.0–34.0)
MCHC: 34.1 g/dL (ref 32.0–36.0)
MCV: 100.5 fL — AB (ref 80.0–100.0)
Monocytes Absolute: 0.5 10*3/uL (ref 0.2–1.0)
Monocytes Relative: 18 %
NEUTROS ABS: 1.6 10*3/uL (ref 1.4–6.5)
NEUTROS PCT: 53 %
Platelets: 122 10*3/uL — ABNORMAL LOW (ref 150–440)
RBC: 2.28 MIL/uL — AB (ref 4.40–5.90)
RDW: 18.1 % — ABNORMAL HIGH (ref 11.5–14.5)
WBC: 3 10*3/uL — ABNORMAL LOW (ref 3.8–10.6)

## 2015-11-07 LAB — BASIC METABOLIC PANEL
ANION GAP: 7 (ref 5–15)
BUN: 28 mg/dL — ABNORMAL HIGH (ref 6–20)
CALCIUM: 9.1 mg/dL (ref 8.9–10.3)
CHLORIDE: 95 mmol/L — AB (ref 101–111)
CO2: 30 mmol/L (ref 22–32)
Creatinine, Ser: 1.06 mg/dL (ref 0.61–1.24)
GFR calc non Af Amer: >60 mL/min (ref >60)
Glucose, Bld: 84 mg/dL (ref 65–99)
POTASSIUM: 3.8 mmol/L (ref 3.5–5.1)
Sodium: 132 mmol/L — ABNORMAL LOW (ref 135–145)

## 2015-11-07 LAB — VALPROIC ACID LEVEL: Valproic Acid Lvl: 66 ug/mL (ref 50.0–100.0)

## 2015-11-07 MED ORDER — ENSURE ENLIVE PO LIQD
1.0000 | Freq: Three times a day (TID) | ORAL | Status: DC
  Administered 2015-11-07 – 2015-11-18 (×32): 237 mL via ORAL

## 2015-11-07 MED ORDER — SODIUM CHLORIDE 0.9 % IV BOLUS (SEPSIS)
500.0000 mL | Freq: Once | INTRAVENOUS | Status: AC
  Administered 2015-11-07: 500 mL via INTRAVENOUS

## 2015-11-07 MED ORDER — FUROSEMIDE 20 MG PO TABS
20.0000 mg | ORAL_TABLET | Freq: Every day | ORAL | Status: DC
  Administered 2015-11-09: 09:00:00 20 mg via ORAL
  Filled 2015-11-07 (×2): qty 1

## 2015-11-07 NOTE — Progress Notes (Addendum)
Palliative Medicine Inpatient Consult Follow Up Note   Name: [name] Date: 11/07/2015 MRN: [id number]  DOB: 1961/04/13  Referring Physician: Hillary Bow, MD  Palliative Care consult requested for this 54 y.o. male for goals of medical therapy in patient with Altered Mental Status and other problems as detailed below.  He has had numerous hospital admissions for hyponatremia. He still has this, but it is not as severe as it has been.  Dr. Tamala Julian, neurology, altered his seizure medication regimen to reduce the liklihood of seizure meds contributing to this problem.  The patient is more alert, but still lacks capacity to make decisions, though he is generally aware to his situation and place.  He needs guardianship, and this is being pursued by DSS, but no guardian has been appointed as yet.   TODAY'S DISCUSSIONS AND DECISION: I examined and talked with patient.  I talked also with the Case Manager about the status of guardianship.  Patient cannot identify family or friends though he says he has friends.  Had a visitor recently but this person only knew the pt b/c he bought his house not too long ago (per nursing).  Pt tells me he [place] He then tells me he has 'lived  in [place] all his life and grew up here'. Says his mother dies in Apr 13, 2005 of strokes and his father died before then. Also tells me he went to [employer] in [place] and then went to [employer] and majored in [place] and [employer], graduating in 04/13/1980. He says he then went to [employer] and graduated in 04/13/84. 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  All of this is told in a very tangential way.  He did not answer questions directly and kept going back to talking about how the cigarettes and obesity caused his father to commit suicide slowly. He cannot identify any living relatives or even name friends.    Pt is more alert and even more oriented.  But he cannot identify anyone who might help make decisions for him. Nothing has changed in this regard.  He will remain full code at this time.  He still needs to have a guardian appointed.    There is little more that I can add at this time, in terms of identifying a decision maker or helping with goals of care.   I have been informed that he has told the aids that they are supposed to feed him, due to his tremors, but staff feels he can have a towel over his chest, and attempt to feed himself with standby oversight (now that he is more alert)   Will pass this info along to his attending.  I will sign off at this time.      I  recommend that  A MOST FORM be completed by his guardian once he has a guardian.     IMPRESSION: 1. Recurrent hyponatremia ---Treated with Tolvaptan and monitored by nephrologist this adm ---Patient has had ER visits and multiple hospital admissions for this same diagnosis for several years at least.  ---Unclear if SIADH or other etiology as yet.  2. Failure to thrive, severe malnutrition, unintended wt loss of '30 lbs in 3 month' reported ---has had trouble eating adequately for years. 3. Anemia  ---macrocytic with Hb 9.2 (MCV is 100.3) ---stool is neg for Occult Blood  No B12 or Folate in lab results as yet. 4.  Abdominal Pain 5. Seizure disorder ---it is thought that his seizure meds might be causing the hyponatremia (possibly).  6. Chronic Kidney Disease ---Stage II  ---Current GFR >60 however ( it fluctuates) 7. HTN ---nl BP off all BP meds now 8. Falls ---Had fall in hospital 11/13 with resultant laceration to head. ----safety sitter due to decreased awareness of safety ---CT of chest, abdomen and pelvis were neg except for paritally healed lower rib fractures.  9. Metabolic encephalopathy ----Psychiatry Rec Zyprexa to help with depression and appetite on 11/13 ----CT head is negative For acute issue ---with decreased awareness  resulting in cancellation of EGD as inpt --for outpt endo  10. Ventriculomegaly  11. Seizure Disorder ---Seizure meds adjusted  12. Essential Tremors ---treated wth propanolol but this was held due to mild bradycardia 13. Near syncope with chronic abdominal pain ---transfused on unit of PRBCs with repeat Hgb 8.3 - 9.5 14. Migraine headaches 15.  B12 and Folate Deficiencies 16. Lack of social support ---impoverished (medicaid potential) ---DSS Guardianship may need to be pursued. Once his sodium is normal and he has had a few days here, he can be reassessed for capacity to make decisions.  17.  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    -----------------------------------------------------------------------------  REVIEW OF SYSTEMS:  Able to ambulate with a rolling walker with 5' wheels per PT (who recommends SNF level of care)  CODE STATUS: Full code   DECISION MAKERS: Pt has no capacity to make his own decisions per psychiatry.   No POA or family known.   PAST MEDICAL HISTORY: Past Medical History  Diagnosis Date  . Hypertension   . Seizures ([place])   . Kidney disease   . Anemia   . Migraine   . Hyponatremia     chronic    PAST SURGICAL HISTORY:  Past Surgical History  Procedure Laterality Date  . None      Vital Signs: BP 103/54 mmHg  Pulse 96  Temp(Src) 99.6 F (37.6 C) (Oral)  Resp 20  Ht 5\' 3"  (1.6 m)  Wt 59.104 kg (130 lb 4.8 oz)  BMI 23.09 kg/m2  SpO2 99% Filed Weights   11/05/15 0500 11/06/15 0654 11/07/15 0511  Weight: 60.691 kg (133 lb 12.8 oz) 59.45 kg (131 lb 1 oz) 59.104 kg (130 lb 4.8 oz)    Estimated body mass index is 23.09 kg/(m^2) as calculated from the following:   Height as of this encounter: 5\' 3"  (1.6 m).   Weight as of this encounter: 59.104 kg (130 lb  4.8 oz).  PHYSICAL EXAM: Awake, lying in medical bed. Verbal --able to say "I realize I am getting better"  And "I realize I was very sick" Has a dressing over forehead lac. EOMI OP is clear (dentition if poor) Neck is w/o JVD or TM Hrt rrr no mgr Lungs cta Abd soft and NT Skin warm and dry He was tremulous He has on hose and these are digging into his calves and leaving red marks (mentioned to pt).    LABS: CBC:    Component Value Date/Time   WBC 3.0* 11/07/2015 0442   WBC 3.8 03/10/2015 1103   HGB 7.8* 11/07/2015 0442   HGB 9.0* 03/10/2015 1103   HCT 22.9* 11/07/2015 0442   HCT 27.1* 03/10/2015 1103   PLT 122*  11/07/2015 0442   PLT 148* 03/10/2015 1103   MCV 100.5* 11/07/2015 0442   MCV 99 03/10/2015 1103   NEUTROABS 1.6 11/07/2015 0442   NEUTROABS 2.0 10/06/2014 1128   LYMPHSABS 0.8* 11/07/2015 0442   LYMPHSABS 1.7 10/06/2014 1128   MONOABS 0.5 11/07/2015 0442   MONOABS 0.5 10/06/2014 1128   EOSABS 0.0 11/07/2015 0442   EOSABS 0.2 10/06/2014 1128   BASOSABS 0.0 11/07/2015 0442   BASOSABS 0.0 10/06/2014 1128   Comprehensive Metabolic Panel:    Component Value Date/Time   NA 132* 11/07/2015 0442   NA 131* 03/10/2015 1103   K 3.8 11/07/2015 0442   K 4.5 03/10/2015 1103   CL 95* 11/07/2015 0442   CL 96* 03/10/2015 1103   CO2 30 11/07/2015 0442   CO2 26 03/10/2015 1103   BUN 28* 11/07/2015 0442   BUN 7 03/10/2015 1103   CREATININE 1.06 11/07/2015 0442   CREATININE 1.19 03/10/2015 1103   GLUCOSE 84 11/07/2015 0442   GLUCOSE 87 03/10/2015 1103   CALCIUM 9.1 11/07/2015 0442   CALCIUM 8.8* 03/10/2015 1103   AST 33 11/03/2015 0605   AST 19 10/06/2014 1128   ALT 15* 11/03/2015 0605   ALT 17 10/06/2014 1128   ALKPHOS 77 11/03/2015 0605   ALKPHOS 50 10/06/2014 1128   BILITOT 0.6 11/03/2015 0605   BILITOT 0.3 10/06/2014 1128   PROT 5.3* 11/03/2015 0605   PROT 6.8 10/06/2014 1128   ALBUMIN 2.8* 11/03/2015 0605   ALBUMIN 3.6 10/06/2014 1128      More  than 50% of the visit was spent in counseling/coordination of care: YES  Time Spent:  35 min

## 2015-11-07 NOTE — Progress Notes (Signed)
Nutrition Follow-up  DOCUMENTATION CODES:   Severe malnutrition in context of chronic illness  INTERVENTION:   Meals and Snacks: Cater to patient preferences; [place] CNA reports pt currently a feeder however improving at meal times Medical Food Supplement Therapy: will recommend increasing Ensure to TID as pt likes and drinks very well. Recommend continuing other supplements as ordered.   NUTRITION DIAGNOSIS:   Malnutrition related to chronic illness as evidenced by percent weight loss, severe depletion of muscle mass, moderate depletion of body fat.  GOAL:   Patient will meet greater than or equal to 90% of their needs  MONITOR:    (Energy Intake, Anthropometrics, Electrolyte and renal Profile, Digestive system)  REASON FOR ASSESSMENT:   Consult Poor PO  ASSESSMENT:   Pt fell per Nsg on 11/12 with laceration to head. Pt since drowsy, unresponsive at times; currently with sitter. Neurology following; per note  AMS secondary to anemia, hyponatremia, FTT and delirium.  Pt scheduled for EGD/colonoscopy 11/15 however cancelled as pt too drowsy to take prep.  Nephrology following for hyponatremia. Hyponatremia improving; per Nephrology continue lasix and salt tabs.  Per MD note likely pt to discharge to SNF when bed available. Pt alert on visit this am.    Diet Order:  Diet - low sodium heart healthy Diet regular Room service appropriate?: Yes; Fluid consistency:: Thin; Fluid restriction:: 1200 mL Fluid    Current Nutrition: Pt reports eating 100% of meatloaf, corn, ice cream, Mighty Shake and magic cup at lunch today. CNA reports pt ate very well at breakfast and lunch today with Ensure earlier this am. Pt reports not current trouble chewing or swallowing. Recorded po intake much improved in the last few days 70-100% of meals.    Gastrointestinal Profile: Last BM: 11/07/2015   Scheduled Medications:  . carbamazepine  200 mg Oral BID  . cyanocobalamin  1,000 mcg  Subcutaneous Daily  . divalproex  750 mg Oral Q12H  . docusate sodium  100 mg Oral BID  . feeding supplement (ENSURE ENLIVE)  1 Bottle Oral TID BM  . folic acid  1 mg Oral Daily  . [START ON 11/08/2015] furosemide  20 mg Oral Daily  . lactulose  20 g Oral TID  . OLANZapine  5 mg Oral QHS  . pantoprazole  40 mg Oral Daily  . sodium chloride  3 mL Intravenous Q12H  . sodium chloride  1 g Oral BID WC    Electrolyte/Renal Profile and Glucose Profile:   Recent Labs Lab 11/05/15 0355 11/06/15 0433 11/07/15 0442  NA 128* 127* 132*  K 4.7 3.8 3.8  CL 92* 91* 95*  CO2 31 30 30   BUN 16 16 28*  CREATININE 0.91 0.97 1.06  CALCIUM 8.7* 8.5* 9.1  GLUCOSE 88 83 84   Protein Profile:  Recent Labs Lab 11/03/15 0605  ALBUMIN 2.8*   Nutritional Anemia Profile:  CBC Latest Ref Rng 11/07/2015 11/06/2015 11/05/2015  WBC 3.8 - 10.6 K/uL 3.0(L) - 2.8(L)  Hemoglobin 13.0 - 18.0 g/dL 7.8(L) 7.4(L) 7.6(L)  Hematocrit 40.0 - 52.0 % 22.9(L) - 22.8(L)  Platelets 150 - 440 K/uL 122(L) - 92(L)     Weight Trend since Admission: Filed Weights   11/05/15 0500 11/06/15 0654 11/07/15 0511  Weight: 133 lb 12.8 oz (60.691 kg) 131 lb 1 oz (59.45 kg) 130 lb 4.8 oz (59.104 kg)   RD notes weight decrease   BMI:  Body mass index is 23.09 kg/(m^2).  Estimated Nutritional Needs:   Kcal:  BEE:  1280kcals, TEE; (IF 1.1-1.3)(AF 1.2) 1690-2000kcals  Protein:  44-56g protein (0.8-1.0g/kg)  Fluid:  1400-1657mL of fluid (25-36mL/kg)  EDUCATION NEEDS:   No education needs identified at this time   [place], RD, LDN Pager [phone]

## 2015-11-07 NOTE — Progress Notes (Signed)
Physical Therapy Treatment Patient Details Name: [name] MRN: [id number] DOB: December 22, 1960 Today's Date: 11/07/2015    History of Present Illness Pt is a 54 y.o. male admitted 10/27/15 with abdominal pain, general weakness, and lightheadedness x5 days.  Pt s/p 1 unit PRBC's d/t anemia.  Pt s/p fall 10/30/15 with lacerations on head.  CT of head negative for fx and acute intracranial findings (positive for small midline frontal scalp contusion).  Pt also with multiple B lower rib fx's (present prior to current admission).    PT Comments    Pt appearing very "shaky" in general with functional mobility but able to progress to ambulating 120 feet with RW and 2 assist.  Pt with intermittent loss of balance R/L and posterior requiring 2 assist to steady and for safety.  Continue to recommend pt discharge to STR d/t current assist levels (pt is not safe to discharge home alone).   Follow Up Recommendations  SNF     Equipment Recommendations  Rolling walker with 5" wheels    Recommendations for Other Services       Precautions / Restrictions Precautions Precautions: Fall Restrictions Weight Bearing Restrictions: No    Mobility  Bed Mobility Overal bed mobility: Needs Assistance Bed Mobility: Supine to Sit     Supine to sit: Supervision;HOB elevated     General bed mobility comments: SBA and vc's for safety required  Transfers Overall transfer level: Needs assistance Equipment used: Rolling walker (2 wheeled) Transfers: Sit to/from [facility] Sit to Stand: Min assist;+2 safety/equipment Stand pivot transfers: Min assist;+2 safety/equipment (stand step turn bed to recliner)       General transfer comment: pt shaky and unsteady standing; posterior loss of balance upon standing  Ambulation/Gait Ambulation/Gait assistance: Min assist;+2 physical assistance;+2 safety/equipment Ambulation Distance (Feet): 120 Feet Assistive device: Rolling walker (2  wheeled)   Gait velocity: decreased   General Gait Details: decreased B step length/foot clearance/heelstrike; unsteady with intermittent loss of balance R/L/posterior requiring assist to steady   Stairs            Wheelchair Mobility    Modified Rankin (Stroke Patients Only)       Balance Overall balance assessment: Needs assistance Sitting-balance support: Bilateral upper extremity supported;Feet supported Sitting balance-Leahy Scale: Good     Standing balance support: Bilateral upper extremity supported (on RW) Standing balance-Leahy Scale: Poor Standing balance comment: posterior loss of balance upon standing                    Cognition Arousal/Alertness: Awake/alert Behavior During Therapy: WFL for tasks assessed/performed Overall Cognitive Status: Within Functional Limits for tasks assessed                      Exercises   Performed semi-supine B LE therapeutic exercise x 10 reps:  Ankle pumps (AROM B LE's); quad sets x3 second holds (AROM B LE's); glute squeezes x3 second holds (AROM B);  SAQ's (AROM R; AROM L); heelslides (AROM R; AROM L), hip abd/adduction (AROM R; AROM L).  Pt required vc's and tactile cues for correct technique with exercises.     General Comments   Nursing cleared pt for participation in physical therapy.  Pt agreeable to PT session.      Pertinent Vitals/Pain Pain Assessment: No/denies pain  Vitals stable and WFL throughout treatment session.    Home Living  Prior Function            PT Goals (current goals can now be found in the care plan section) Acute Rehab PT Goals Patient Stated Goal: to improve balance PT Goal Formulation: With patient Time For Goal Achievement: 11/21/15 Potential to Achieve Goals: Good Progress towards PT goals: Progressing toward goals    Frequency  Min 2X/week    PT Plan Current plan remains appropriate    Co-evaluation             End of  Session Equipment Utilized During Treatment: Gait belt Activity Tolerance: Patient tolerated treatment well Patient left: in chair;with call bell/phone within reach;with chair alarm set     Time: 1140-1205 PT Time Calculation (min) (ACUTE ONLY): 25 min  Charges:  $Therapeutic Exercise: 8-22 mins $Therapeutic Activity: 8-22 mins                    G CodesLeitha Bleak 2015/11/27, 12:26 PM Leitha Bleak, [place]

## 2015-11-07 NOTE — Progress Notes (Signed)
Check in on patient today again. The patient was to undergo an EGD and colonoscopy for his anemia but had a fall with mental status changes around the time he was supposed to do it. The patient now refuses any upper or lower endoscopic procedures. I will sign off on this patient at this time.

## 2015-11-07 NOTE — Progress Notes (Signed)
[facility] NAME: [name]    MR#:  [id number]  DATE OF BIRTH:  1961-02-11   CHIEF COMPLAINT: Weakness  SUBJECTIVE:  Patient is awake and in good spirits. No pain. Uses walker at home. Feels stronger   REVIEW OF SYSTEMS:  Review of systems  CONSTITUTIONAL: No fever, has weakness.  EYES: No blurred or double vision.  EARS, NOSE, AND THROAT: No tinnitus or ear pain.  RESPIRATORY: No SOB/Cough CARDIOVASCULAR: No chest pain, orthopnea, edema.  GASTROINTESTINAL: No nausea, vomiting, diarrhea or abdominal pain.  GENITOURINARY: No dysuria, hematuria.  ENDOCRINE: No polyuria, nocturia,  HEMATOLOGY: No anemia, easy bruising or bleeding SKIN: No rash or lesion. MUSCULOSKELETAL: No joint pain or arthritis.  NEUROLOGIC: No tingling, numbness, weakness.  PSYCHIATRY: No anxiety or depression.         DRUG ALLERGIES:  No Known Allergies  VITALS:  Blood pressure 105/49, pulse 70, temperature 98.9 F (37.2 C), temperature source Oral, resp. rate 18, height 5\' 3"  (1.6 m), weight 59.104 kg (130 lb 4.8 oz), SpO2 99 %.  PHYSICAL EXAMINATION:  GENERAL:  54 y.o.-year-old patient lying in the bed with no acute distress.  EYES: Pupils equal, round, reactive to light and accommodation. No scleral icterus.  HEENT: dressings on head- after laceration last evening, normocephalic. NECK:  Supple, no jugular venous distention. No thyroid enlargement, no tenderness.  LUNGS: Normal breath sounds bilaterally, no wheezing, rales,rhonchi or crepitation. No use of accessory muscles of respiration.  CARDIOVASCULAR: S1, S2 normal. No murmurs, rubs, or gallops.  ABDOMEN: Soft, nontender, nondistended. Bowel sounds present. No organomegaly or mass.  EXTREMITIES: No pedal edema, cyanosis, or clubbing.  NEUROLOGIC: not follow commands. PSYCHIATRIC: The patient is awake and alert SKIN: No obvious rash, lesion, or ulcer.    LABORATORY  PANEL:   CBC  Recent Labs Lab 11/07/15 0442  WBC 3.0*  HGB 7.8*  HCT 22.9*  PLT 122*   ------------------------------------------------------------------------------------------------------------------  Chemistries   Recent Labs Lab 11/03/15 0605  11/07/15 0442  NA 132*  < > 132*  K 4.3  < > 3.8  CL 97*  < > 95*  CO2 28  < > 30  GLUCOSE 88  < > 84  BUN 19  < > 28*  CREATININE 0.91  < > 1.06  CALCIUM 9.1  < > 9.1  AST 33  --   --   ALT 15*  --   --   ALKPHOS 77  --   --   BILITOT 0.6  --   --   < > = values in this interval not displayed. ------------------------------------------------------------------------------------------------------------------  Cardiac Enzymes No results for input(s): TROPONINI in the last 168 hours. ------------------------------------------------------------------------------------------------------------------  RADIOLOGY:  No results found.   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 On lasix and salt tabs. Started 1200 ml fluid restriction.   #Near syncope secondary to Symptomatic anemia with chronic abdominal pain Transfused 1 unit of blood and repeat hemoglobin at 8.3--9.5   Stool for occult blood is negative in the ED. Continue Protonix  CT chest, abdomen and pelvis are negative except for lower rib fractures. Hb stable at this point. EGD/Colonoscopy as OP with Dr. Allen Norris.  # B12 and Folate deficiency. Replacement started  # Failure to thrive - unintentional weight loss of 30 pounds in 3 months.  CT head is negative for any metastases. CT chest, abdomen and pelvis -no malignancy  Patient is scheduled for EGD and colonoscopy as OP. If not clear source, then likely will be due to psych issues.  # Metabolic encephalopathy - altered mental status Ct ehad negative  Normal  tegretol and depakot level.  # Partially healed lower rib fractures.  #. Essential tremors-continue home medication propranolol  #. Hypertension continue amlodipine and ACE inhibitor  #. History of seizures On Depakote and Tegretol. Lactulose.   He has no capacity to make decisions at this point per Dr. Weber Cooks. No family member or POA.  SW working on placement at [facility]  All the records are reviewed and case discussed with Care Management/Social Workerr.  CODE STATUS: Full code  TOTAL TIME TAKING CARE OF THIS PATIENT: 35 minutes.   D/C when bed available at [facility]   Hillary Bow R M.D on 11/07/2015 at 11:35 AM  Between 7am to 6pm - Pager - [phone] After 6pm go to [email or web address] - password [email or web address] [facility]  Office  [phone]  CC: Primary care physician; [name], MD

## 2015-11-07 NOTE — Plan of Care (Signed)
Problem: Safety: Goal: Ability to remain free from injury will improve Outcome: Progressing High fall risk precautions maintained. Pt remained free of injury. Pt was a&Ox4 earlier today and followed commands. Around 1550 noted that pt disorientedx4, very confused,did not follow commands,vss.MD aware.  Problem: Tissue Perfusion: Goal: Risk factors for ineffective tissue perfusion will decrease Outcome: Progressing Pt refuses SCD's. Repositioned per request, pt refused to be repositioned qx2xhrs. Up to the chair today.  Problem: Fluid Volume: Goal: Ability to maintain a balanced intake and output will improve Outcome: Progressing Pt voids without difficulty. VSS. Fluids encouraged.  Problem: Nutrition: Goal: Adequate nutrition will be maintained Outcome: Progressing Very good appetite. Pt ate 100% of meals.

## 2015-11-07 NOTE — Clinical Social Work Note (Signed)
CSW initiated SNF search for pt. CSW also attempted to reach pt's neighbor and left a message requesting a return phone call. CSW also left a message with DSS Guardianship workers. CSW also left a message with financial counselor regarding possible Medicaid. CSW will continue to follow.   Darden Dates, MSW, LCSW Clinical Social Worker  [phone]

## 2015-11-07 NOTE — Progress Notes (Signed)
[facility]  ROUNDING NOTE   Subjective:  Na currently 132   Currently on lasix and salt tabs. Seems to be at baseline mental status now.   Objective:  Vital signs in last 24 hours:  Temp:  [98.9 F (37.2 C)-99.6 F (37.6 C)] 99.6 F (37.6 C) (11/21 1322) Pulse Rate:  [70-96] 96 (11/21 1322) Resp:  [18-20] 20 (11/21 1322) BP: (88-105)/(40-54) 103/54 mmHg (11/21 1322) SpO2:  [98 %-99 %] 99 % (11/21 1322) Weight:  [59.104 kg (130 lb 4.8 oz)] 59.104 kg (130 lb 4.8 oz) (11/21 0511)  Weight change: -0.346 kg (-12.2 oz) Filed Weights   11/05/15 0500 11/06/15 0654 11/07/15 0511  Weight: 60.691 kg (133 lb 12.8 oz) 59.45 kg (131 lb 1 oz) 59.104 kg (130 lb 4.8 oz)    Intake/Output: I/O last 3 completed shifts: In: 243 [P.O.:240; I.V.:3] Out: [id number] [Urine:3150; Stool:1]   Intake/Output this shift:  Total I/O In: 240 [P.O.:240] Out: 200 [Urine:200]  Physical Exam: General: Awake, alert, no acute distress.  Head: Laceration on forehead covered with dressing. Moist oral mucosal membranes  Eyes: Anicteric  Neck: Supple, trachea midline  Lungs:  Clear to auscultation  Heart: Regular rate and rhythm, no rubs  Abdomen:  Soft, nontender, BS present  Extremities:  trace peripheral edema.  Neurologic: Awake, alert, following commands  Skin: Forehead laceration       Basic Metabolic Panel:  Recent Labs Lab 11/02/15 0548  11/02/15 1429 11/03/15 0605 11/05/15 0355 11/06/15 0433 11/07/15 0442  NA 132*  < > 132* 132* 128* 127* 132*  K 4.4  --   --  4.3 4.7 3.8 3.8  CL 100*  --   --  97* 92* 91* 95*  CO2 26  --   --  28 31 30 30   GLUCOSE 89  --   --  88 88 83 84  BUN 21*  --   --  19 16 16  28*  CREATININE 1.10  --   --  0.91 0.91 0.97 1.06  CALCIUM 8.6*  --   --  9.1 8.7* 8.5* 9.1  < > = values in this interval not displayed.  Liver Function Tests:  Recent Labs Lab 11/03/15 0605  AST 33  ALT 15*  ALKPHOS 77  BILITOT 0.6  PROT 5.3*  ALBUMIN 2.8*    No results for input(s): LIPASE, AMYLASE in the last 168 hours.  Recent Labs Lab 11/03/15 0957  AMMONIA 30    CBC:  Recent Labs Lab 11/02/15 0548 11/05/15 0355 11/06/15 0433 11/07/15 0442  WBC 3.9 2.8*  --  3.0*  NEUTROABS  --   --   --  1.6  HGB 9.2* 7.6* 7.4* 7.8*  HCT 27.4* 22.8*  --  22.9*  MCV 100.3* 100.0  --  100.5*  PLT 122* 92*  --  122*    Cardiac Enzymes: No results for input(s): CKTOTAL, CKMB, CKMBINDEX, TROPONINI in the last 168 hours.  BNP: Invalid input(s): POCBNP  CBG: No results for input(s): GLUCAP in the last 168 hours.  Microbiology: Results for orders placed or performed in visit on 11/26/13  Urine culture     Status: None   Collection Time: 11/27/13  5:05 AM  Result Value Ref Range Status   Micro Text Report   Final       SOURCE: CLEAN CATCH    COMMENT                   MIXED BACTERIAL ORGANISMS  COMMENT                   RESULTS SUGGESTIVE OF CONTAMINATION   ANTIBIOTIC                                                      Clostridium Difficile [facility])     Status: None   Collection Time: 11/29/13  9:51 AM  Result Value Ref Range Status   Micro Text Report   Final       C.DIFFICILE ANTIGEN       C.DIFFICILE GDH ANTIGEN : NEGATIVE   C.DIFFICILE TOXIN A/B     C.DIFFICILE TOXINS A AND B : NEGATIVE   INTERPRETATION            Negative for C. difficile.    ANTIBIOTIC                                                        Coagulation Studies: No results for input(s): LABPROT, INR in the last 72 hours.  Urinalysis: No results for input(s): COLORURINE, LABSPEC, PHURINE, GLUCOSEU, HGBUR, BILIRUBINUR, KETONESUR, PROTEINUR, UROBILINOGEN, NITRITE, LEUKOCYTESUR in the last 72 hours.  Invalid input(s): APPERANCEUR    Imaging: No results found.   Medications:     . carbamazepine  200 mg Oral BID  . cyanocobalamin  1,000 mcg Subcutaneous Daily  . divalproex  750 mg Oral Q12H  . docusate sodium  100 mg Oral BID  . feeding  supplement (ENSURE ENLIVE)  1 Bottle Oral TID BM  . folic acid  1 mg Oral Daily  . [START ON 11/08/2015] furosemide  20 mg Oral Daily  . lactulose  20 g Oral TID  . OLANZapine  5 mg Oral QHS  . pantoprazole  40 mg Oral Daily  . sodium chloride  3 mL Intravenous Q12H  . sodium chloride  1 g Oral BID WC   acetaminophen **OR** acetaminophen, butalbital-acetaminophen-caffeine, ondansetron **OR** ondansetron (ZOFRAN) IV  Assessment/ Plan:  54 y.o. male with hypertension, seizure disorder, chronic speech disorder, CKD stage II, anemia of CKD, SHPTH.  1.  Hyponatremia. 2.  CKD stage II 3.  Hypertension. 4.   Altered mental status.  Plan:  Will place on 1258mL fluid restriction.  Otherwise continue salt tabs and lasix for now.  Pt previously responded very well to one dose of tolvaptan.  Lasix back to 20mg  po daily.  Will continue to follow progress, pt will most likely require placement.    LOS: 11 Otto Caraway 11/21/20162:34 PM

## 2015-11-07 NOTE — Consult Note (Signed)
  Psychiatry: Follow-up for this 54 year old man with multiple medical problems. Chart reviewed. Spoke with nursing staff. Spoke with another physician who is following up with the patient. Patient interviewed today. On my interview today the patient was remarkably delirious. He was not able to say his name and not able to answer any questions. When I would ask him a question he would make a kind of response but it always consisted of the same words which make no sense at all. He was picking at his clothing and did not maintain eye contact. Waxing and waning mental state. Seemed to be trying to pull himself out of bed at times but for no particular reason.  Nursing tells me that he was actually quite lucid earlier in the day when he spoke with another physician. Palliative care physician tells me that he was sort of in between; semi-coherent but still very confused earlier today.  I don't see that he received any medication during the mid part of the day. Not sure why he is having this much waxing and waning delirium. I do not think this is a psychiatric problem. I continued to be able to state clearly that he has no capacity to make any decisions for himself given his current condition or the conditions I have seen him in anytime recently.  I will remain available if anything is needed but I'm otherwise going to sign off of regular follow-up for this gentleman as I don't really think he has a psychiatric problem much as a medical and neurologic problem creating delirium.

## 2015-11-07 NOTE — Plan of Care (Signed)
Problem: Safety: Goal: Ability to remain free from injury will improve Outcome: Progressing Patient compliant with calling for assistance.  He did not attempt to leave bed this shift.  Bed in lowest position with wheels locked and call bell within reach.  Bed alarm on at all times.  Patient without injury this shift.  Problem: Tissue Perfusion: Goal: Risk factors for ineffective tissue perfusion will decrease Outcome: Progressing Patient more alert and oriented this shift.  VSS BP 100/40 mmHg  Pulse 93  Temp(Src) 99 F (37.2 C) (Oral)  Resp 20  Ht 5\' 3"  (1.6 m)  Wt 131 lb 1 oz (59.45 kg)  BMI 23.22 kg/m2  SpO2 99%

## 2015-11-08 NOTE — Plan of Care (Signed)
Problem: Safety: Goal: Ability to remain free from injury will improve Outcome: Progressing Patient doing well today, alert and oriented X 3 all day, remains a little hypotensive, eating all of meals, swallows pill whole with water.  Bed search in process at this time.

## 2015-11-08 NOTE — Progress Notes (Signed)
Physical Therapy Treatment Patient Details Name: [name] MRN: [id number] DOB: December 18, 1960 Today's Date: 11/08/2015    History of Present Illness Pt is a 54 y.o. male admitted 10/27/15 with abdominal pain, general weakness, and lightheadedness x5 days.  Pt s/p 1 unit PRBC's d/t anemia.  Pt s/p fall 10/30/15 with lacerations on head.  CT of head negative for fx and acute intracranial findings (positive for small midline frontal scalp contusion).  Pt also with multiple B lower rib fx's (present prior to current admission).    PT Comments    Patient tolerating progressive increase in gait distance and overall activity level, but continues to require +1 hands-on assist for all functional activities due to poor balance and high fall risk.  Very tremulous in all extremities/trunk, worsened with fatigue. BERG assessment 10/56, indicative of very high fall risk with functional mobility.  Unsafe to return home at this time; continue to recommend transition to STR upon discharge.   Follow Up Recommendations  SNF     Equipment Recommendations  Rolling walker with 5" wheels    Recommendations for Other Services       Precautions / Restrictions Precautions Precautions: Fall Restrictions Weight Bearing Restrictions: No    Mobility  Bed Mobility Overal bed mobility: Modified Independent Bed Mobility: Supine to Sit;Sit to Supine              Transfers Overall transfer level: Needs assistance Equipment used: Rolling walker (2 wheeled) Transfers: Sit to/from Stand Sit to Stand: Min assist         General transfer comment: very tremulous and unsteady; frequent posterior LOB requiring min assist for correction (patient frequently spontaneously sitting vs. attempting correction)  Ambulation/Gait Ambulation/Gait assistance: Min assist Ambulation Distance (Feet): 200 Feet Assistive device: Rolling walker (2 wheeled)   Gait velocity: 10' walk time, 12 seconds--indicative of very  high fall risk Gait velocity interpretation: <1.8 ft/sec, indicative of risk for recurrent falls General Gait Details: reciprocal stepping pattern with very inconsistent step height/length and foot placement, inconsistent cadence and gait speed; poor stability with dynamic gait components.  Very high fall risk.   Stairs            Wheelchair Mobility    Modified Rankin (Stroke Patients Only)       Balance Overall balance assessment: Needs assistance Sitting-balance support: No upper extremity supported;Feet supported Sitting balance-Leahy Scale: Fair     Standing balance support: No upper extremity supported Standing balance-Leahy Scale: Poor                      Cognition Arousal/Alertness: Awake/alert Behavior During Therapy: WFL for tasks assessed/performed;Impulsive Overall Cognitive Status:  (follows commands; very tangential in speech)                      Exercises      General Comments        Pertinent Vitals/Pain Pain Assessment: No/denies pain    Home Living                      Prior Function            PT Goals (current goals can now be found in the care plan section) Acute Rehab PT Goals Patient Stated Goal: to improve balance PT Goal Formulation: With patient Time For Goal Achievement: 11/21/15 Potential to Achieve Goals: Good Progress towards PT goals: Progressing toward goals    Frequency  Min 2X/week  PT Plan Current plan remains appropriate    Co-evaluation             End of Session Equipment Utilized During Treatment: Gait belt Activity Tolerance: Patient tolerated treatment well Patient left: in bed;with call bell/phone within reach;with bed alarm set     Time: [id number] PT Time Calculation (min) (ACUTE ONLY): 20 min  Charges:  $Gait Training: 8-22 mins                    G Codes:      Aishia Barkey H. Owens Shark, PT, DPT, NCS 11/08/2015, 4:46 PM [phone]

## 2015-11-08 NOTE — Progress Notes (Signed)
[place] [facility] [place] NAME: [name]    MR#:  [id number]  DATE OF BIRTH:  1961/03/08   CHIEF COMPLAINT: Weakness  SUBJECTIVE:  Awake and alert. Confused at times.   REVIEW OF SYSTEMS:  Review of systems  CONSTITUTIONAL: No fever, has weakness.  EYES: No blurred or double vision.  EARS, NOSE, AND THROAT: No tinnitus or ear pain.  RESPIRATORY: No SOB/Cough CARDIOVASCULAR: No chest pain, orthopnea, edema.  GASTROINTESTINAL: No nausea, vomiting, diarrhea or abdominal pain.  GENITOURINARY: No dysuria, hematuria.  ENDOCRINE: No polyuria, nocturia,  HEMATOLOGY: No anemia, easy bruising or bleeding SKIN: No rash or lesion. MUSCULOSKELETAL: No joint pain or arthritis.  NEUROLOGIC: No tingling, numbness, weakness.  PSYCHIATRY: No anxiety or depression.         DRUG ALLERGIES:  No Known Allergies  VITALS:  Blood pressure 93/50, pulse 84, temperature 99 F (37.2 C), temperature source Oral, resp. rate 18, height 5\' 3"  (1.6 m), weight 59.149 kg (130 lb 6.4 oz), SpO2 99 %.  PHYSICAL EXAMINATION:  GENERAL:  54 y.o.-year-old patient lying in the bed with no acute distress.  EYES: Pupils equal, round, reactive to light and accommodation. No scleral icterus.  HEENT: dressings on head- after laceration last evening, normocephalic. NECK:  Supple, no jugular venous distention. No thyroid enlargement, no tenderness.  LUNGS: Normal breath sounds bilaterally, no wheezing, rales,rhonchi or crepitation. No use of accessory muscles of respiration.  CARDIOVASCULAR: S1, S2 normal. No murmurs, rubs, or gallops.  ABDOMEN: Soft, nontender, nondistended. Bowel sounds present. No organomegaly or mass.  EXTREMITIES: No pedal edema, cyanosis, or clubbing.  NEUROLOGIC: not follow commands. PSYCHIATRIC: The patient is awake and alert SKIN: No obvious rash, lesion, or ulcer.    LABORATORY PANEL:   CBC  Recent Labs Lab 11/07/15 0442   WBC 3.0*  HGB 7.8*  HCT 22.9*  PLT 122*   ------------------------------------------------------------------------------------------------------------------  Chemistries   Recent Labs Lab 11/03/15 0605  11/07/15 0442  NA 132*  < > 132*  K 4.3  < > 3.8  CL 97*  < > 95*  CO2 28  < > 30  GLUCOSE 88  < > 84  BUN 19  < > 28*  CREATININE 0.91  < > 1.06  CALCIUM 9.1  < > 9.1  AST 33  --   --   ALT 15*  --   --   ALKPHOS 77  --   --   BILITOT 0.6  --   --   < > = values in this interval not displayed. ------------------------------------------------------------------------------------------------------------------  Cardiac Enzymes No results for input(s): TROPONINI in the last 168 hours. ------------------------------------------------------------------------------------------------------------------  RADIOLOGY:  No results found.   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 On lasix and salt tabs. On 1200 ml fluid restriction.  #Near syncope secondary to Symptomatic anemia with chronic abdominal pain Transfused 1 unit of blood and repeat hemoglobin at 8.3--9.5   Stool for occult blood is negative in the ED. Continue Protonix  CT chest, abdomen and pelvis are negative except for lower rib fractures. Hb stable at this point. EGD/Colonoscopy as OP with Dr. Allen Norris.  # B12 and Folate deficiency. Replacement started  # Failure to thrive - unintentional weight loss of 30 pounds in 3 months. CT head is negative for any metastases. CT chest, abdomen  and pelvis -no malignancy  Patient is scheduled for EGD and colonoscopy as OP. If not clear source, then likely will be due to psych issues.  # Metabolic encephalopathy - altered mental status Ct ehad negative  Normal tegretol and depakot level.  # Partially healed lower rib  fractures.  #. Essential tremors-continue home medication propranolol  #. Hypertension continue amlodipine and ACE inhibitor  #. History of seizures On Depakote and Tegretol. Lactulose.   He has no capacity to make decisions at this point per Dr. Weber Cooks. No family member or POA.  SW working on placement at [facility]  All the records are reviewed and case discussed with Care Management/Social Workerr.  CODE STATUS: Full code  TOTAL TIME TAKING CARE OF THIS PATIENT: 25 minutes.   D/C when bed available at [facility]   Hillary Bow R M.D on 11/08/2015 at 2:47 PM  Between 7am to 6pm - Pager - [phone] After 6pm go to [email or web address] - password [email or web address] [place]  Office  [phone]  CC: Primary care physician; [name], MD

## 2015-11-08 NOTE — Plan of Care (Addendum)
No c/o pain. Resting comfortably in bed. More alert after received NS Bolus. VSS, afebrile. SNF bedsearch in progress.   Problem: Safety: Goal: Ability to remain free from injury will improve Outcome: Progressing High fall risk. Bed alarm on, room near nurses station, hourly rounding w/ toileting offered. Safe environment provided.   Problem: Tissue Perfusion: Goal: Risk factors for ineffective tissue perfusion will decrease Outcome: Progressing Pt refuses SCD's and Ted hose. Refuses to remove panty hose brought from home.   Problem: Fluid Volume: Goal: Ability to maintain a balanced intake and output will improve Outcome: Progressing NS 530mL bolus given last night r/t hypotension. Noted improvement of BP and mentation. Encouraged PO intake of fluids.   Problem: Nutrition: Goal: Adequate nutrition will be maintained Outcome: Progressing Regular diet. Very good appetite. Multiple applesauces , 2 bags of chips, and a cup of water throughout the night.

## 2015-11-09 LAB — PREPARE RBC (CROSSMATCH)

## 2015-11-09 LAB — CBC
HCT: 21.3 % — ABNORMAL LOW (ref 40.0–52.0)
Hemoglobin: 7.1 g/dL — ABNORMAL LOW (ref 13.0–18.0)
MCH: 33.8 pg (ref 26.0–34.0)
MCHC: 33.3 g/dL (ref 32.0–36.0)
MCV: 101.4 fL — AB (ref 80.0–100.0)
PLATELETS: 160 10*3/uL (ref 150–440)
RBC: 2.11 MIL/uL — ABNORMAL LOW (ref 4.40–5.90)
RDW: 18.5 % — AB (ref 11.5–14.5)
WBC: 5.1 10*3/uL (ref 3.8–10.6)

## 2015-11-09 LAB — BASIC METABOLIC PANEL
Anion gap: 4 — ABNORMAL LOW (ref 5–15)
BUN: 35 mg/dL — AB (ref 6–20)
CHLORIDE: 101 mmol/L (ref 101–111)
CO2: 30 mmol/L (ref 22–32)
CREATININE: 1.01 mg/dL (ref 0.61–1.24)
Calcium: 8.8 mg/dL — ABNORMAL LOW (ref 8.9–10.3)
GFR calc non Af Amer: >60 mL/min (ref >60)
Glucose, Bld: 84 mg/dL (ref 65–99)
Potassium: 4.2 mmol/L (ref 3.5–5.1)
Sodium: 135 mmol/L (ref 135–145)

## 2015-11-09 LAB — HEMOGLOBIN AND HEMATOCRIT, BLOOD
HCT: 21.6 % — ABNORMAL LOW (ref 40.0–52.0)
HCT: 23.6 % — ABNORMAL LOW (ref 40.0–52.0)
HEMOGLOBIN: 7.2 g/dL — AB (ref 13.0–18.0)
Hemoglobin: 7.9 g/dL — ABNORMAL LOW (ref 13.0–18.0)

## 2015-11-09 MED ORDER — PANTOPRAZOLE SODIUM 40 MG IV SOLR
40.0000 mg | Freq: Two times a day (BID) | INTRAVENOUS | Status: DC
  Administered 2015-11-09 – 2015-11-10 (×2): 40 mg via INTRAVENOUS
  Filled 2015-11-09 (×2): qty 40

## 2015-11-09 MED ORDER — SODIUM CHLORIDE 0.9 % IV SOLN
Freq: Once | INTRAVENOUS | Status: AC
  Administered 2015-11-09: 18:00:00 via INTRAVENOUS

## 2015-11-09 NOTE — Plan of Care (Addendum)
No c/o pain. Resting comfortably in bed. Alert & Oriented x3. VSS, afebrile. SNF bedsearch in progress.   Problem: Safety: Goal: Ability to remain free from injury will improve Outcome: Progressing High fall risk. Bed alarm on, room near nurses station, hourly rounding w/ toileting offered. Safe environment provided.   Problem: Tissue Perfusion: Goal: Risk factors for ineffective tissue perfusion will decrease Outcome: Progressing Pt refuses SCD's and Ted hose. Refuses to remove panty hose brought from home.   Problem: Fluid Volume: Goal: Ability to maintain a balanced intake and output will improve Outcome: Progressing Fluid volume managed. VSS. Encouraged PO intake of fluids within 1261ml per day  Problem: Nutrition: Goal: Adequate nutrition will be maintained Outcome: Progressing Regular diet. Very good appetite. Ate a couple of applesauces tonight again.

## 2015-11-09 NOTE — Plan of Care (Addendum)
Problem: Nutrition: Goal: Adequate nutrition will be maintained Outcome: Completed/Met Date Met:  11/09/15 Plan of care progress: -Bed search still pending offer -pt eating and drinking well -drinking ensure per orders -no complaints of pain, no distress or discomfort noted -pt up x1 assist -pt free from falls, call bell in reach, pt not attempting to get OOB unassisted -pt to get 1 unit PRBC today, monitor HGB

## 2015-11-09 NOTE — Progress Notes (Signed)
[place] at [place] NAME: [name]    MR#:  [id number]  DATE OF BIRTH:  09-25-1961   CHIEF COMPLAINT: Weakness  SUBJECTIVE:  Awake and alert. Confused at times. Feels comfortable, denies any pain   REVIEW OF SYSTEMS:  Review of systems  CONSTITUTIONAL: No fever, has weakness.  EYES: No blurred or double vision.  EARS, NOSE, AND THROAT: No tinnitus or ear pain.  RESPIRATORY: No SOB/Cough CARDIOVASCULAR: No chest pain, orthopnea, edema.  GASTROINTESTINAL: No nausea, vomiting, diarrhea or abdominal pain.  GENITOURINARY: No dysuria, hematuria.  ENDOCRINE: No polyuria, nocturia,  HEMATOLOGY: No anemia, easy bruising or bleeding SKIN: No rash or lesion. MUSCULOSKELETAL: No joint pain or arthritis.  NEUROLOGIC: No tingling, numbness, weakness.  PSYCHIATRY: No anxiety or depression.         DRUG ALLERGIES:  No Known Allergies  VITALS:  Blood pressure 95/46, pulse 83, temperature 98.3 F (36.8 C), temperature source Oral, resp. rate 18, height 5\' 3"  (1.6 m), weight 58.786 kg (129 lb 9.6 oz), SpO2 99 %.  PHYSICAL EXAMINATION:  GENERAL:  54 y.o.-year-old patient lying in the bed with no acute distress.  EYES: Pupils equal, round, reactive to light and accommodation. No scleral icterus.  HEENT: dressings on head- after laceration last evening, normocephalic. NECK:  Supple, no jugular venous distention. No thyroid enlargement, no tenderness.  LUNGS: Normal breath sounds bilaterally, no wheezing, rales,rhonchi or crepitation. No use of accessory muscles of respiration.  CARDIOVASCULAR: S1, S2 normal. No murmurs, rubs, or gallops.  ABDOMEN: Soft, nontender, nondistended. Bowel sounds present. No organomegaly or mass.  EXTREMITIES: No pedal edema, cyanosis, or clubbing.  NEUROLOGIC: not follow commands. PSYCHIATRIC: The patient is awake and alert SKIN: No obvious rash, lesion, or ulcer.    LABORATORY PANEL:    CBC  Recent Labs Lab 11/09/15 0509  WBC 5.1  HGB 7.1*  HCT 21.3*  PLT 160   ------------------------------------------------------------------------------------------------------------------  Chemistries   Recent Labs Lab 11/03/15 0605  11/09/15 0509  NA 132*  < > 135  K 4.3  < > 4.2  CL 97*  < > 101  CO2 28  < > 30  GLUCOSE 88  < > 84  BUN 19  < > 35*  CREATININE 0.91  < > 1.01  CALCIUM 9.1  < > 8.8*  AST 33  --   --   ALT 15*  --   --   ALKPHOS 77  --   --   BILITOT 0.6  --   --   < > = values in this interval not displayed. ------------------------------------------------------------------------------------------------------------------  Cardiac Enzymes No results for input(s): TROPONINI in the last 168 hours. ------------------------------------------------------------------------------------------------------------------  RADIOLOGY:  No results found.   ASSESSMENT AND PLAN:   # Hyponatremia due to SIADH - improvement on Lasix and salt tablets, nephrologist recommended to continue both following sodium level closely Patient is on Depakote and Tegretol which could cause SIADH and hyponatremia Was on samsca, which was discontinued. On 1200 ml fluid restriction.  #Near syncope secondary to Symptomatic anemia with chronic abdominal pain Transfused 1 unit of blood and repeat hemoglobin at 8.3--9.5 , back to 7.1 again today, no obvious bleeding, the patient is hypotensive. Transfuse 1 more unit of packed red blood cells  Stool for occult blood was negative in the ED, repeat again. Continue Protonix, changed to intravenous twice daily  CT chest, abdomen and pelvis are negative except for lower rib fractures.. EGD/Colonoscopy as OP with  Dr. Allen Norris  is being planned.  # Hypotension,  transfuse patient. Follow patient's blood pressure readings closely, patient is on Lasix, discontinue Lasix  # B12 and Folate deficiency. Replacement started  # Failure to thrive  - unintentional weight loss of 30 pounds in 3 months. CT head is negative for any metastases. CT chest, abdomen and pelvis -no malignancy  Patient is scheduled for EGD and colonoscopy as OP. If not clear source, then likely will be due to psych issues.  # Metabolic encephalopathy - altered mental status Ct ehad negative  Normal tegretol and depakot level.  # Partially healed lower rib fractures.  #. Essential tremors-continue home medication propranolol, unless blood pressure readings are low after transfusion  #. Hypertension Discontinue amlodipine and ACE inhibitor  #. History of seizures On Depakote and Tegretol. Lactulose.   He has no capacity to make decisions at this point per Dr. Weber Cooks. No family member or POA.  SW working on placement at [facility]  All the records are reviewed and case discussed with Care Management/Social Workerr.  CODE STATUS: Full code  TOTAL TIME TAKING CARE OF THIS PATIENT: 40 minutes.   D/C when bed available at [facility] M.D on 11/09/2015 at 2:27 PM  Between 7am to 6pm - Pager - [phone] After 6pm go to [email or web address] - password [email or web address] [place] [facility]  Office  [phone]  CC: Primary care physician; [name], MD

## 2015-11-09 NOTE — Progress Notes (Signed)
[facility]  ROUNDING NOTE   Subjective:  Na currently 134 Currently on lasix and salt tabs. Seems to be at baseline mental status now. S Cr also at baseline  Objective:  Vital signs in last 24 hours:  Temp:  [98.3 F (36.8 C)-99.4 F (37.4 C)] 98.3 F (36.8 C) (11/23 0515) Pulse Rate:  [79-91] 79 (11/23 0515) Resp:  [18] 18 (11/23 0515) BP: (89-100)/(45-56) 95/54 mmHg (11/23 0515) SpO2:  [99 %-100 %] 99 % (11/23 0515) Weight:  [58.786 kg (129 lb 9.6 oz)] 58.786 kg (129 lb 9.6 oz) (11/23 0413)  Weight change: -0.363 kg (-12.8 oz) Filed Weights   11/07/15 0511 11/08/15 0500 11/09/15 0413  Weight: 59.104 kg (130 lb 4.8 oz) 59.149 kg (130 lb 6.4 oz) 58.786 kg (129 lb 9.6 oz)    Intake/Output: I/O last 3 completed shifts: In: 480 [P.O.:480] Out: 2425 [Urine:2425]   Intake/Output this shift:  Total I/O In: 240 [P.O.:240] Out: 250 [Urine:250]  Physical Exam: General: Awake, alert, no acute distress.  Head: Laceration on forehead covered with dressing. Moist oral mucosal membranes  Eyes: Anicteric  Neck: Supple, trachea midline  Lungs:  Clear to auscultation  Heart: Regular rate and rhythm, no rubs  Abdomen:  Soft, nontender, BS present  Extremities:  trace peripheral edema.  Neurologic: Awake, alert, following commands  Skin: Forehead laceration       Basic Metabolic Panel:  Recent Labs Lab 11/03/15 0605 11/05/15 0355 11/06/15 0433 11/07/15 0442 11/09/15 0509  NA 132* 128* 127* 132* 135  K 4.3 4.7 3.8 3.8 4.2  CL 97* 92* 91* 95* 101  CO2 28 31 30 30 30   GLUCOSE 88 88 83 84 84  BUN 19 16 16  28* 35*  CREATININE 0.91 0.91 0.97 1.06 1.01  CALCIUM 9.1 8.7* 8.5* 9.1 8.8*    Liver Function Tests:  Recent Labs Lab 11/03/15 0605  AST 33  ALT 15*  ALKPHOS 77  BILITOT 0.6  PROT 5.3*  ALBUMIN 2.8*   No results for input(s): LIPASE, AMYLASE in the last 168 hours.  Recent Labs Lab 11/03/15 0957  AMMONIA 30    CBC:  Recent Labs Lab  11/05/15 0355 11/06/15 0433 11/07/15 0442 11/09/15 0509  WBC 2.8*  --  3.0* 5.1  NEUTROABS  --   --  1.6  --   HGB 7.6* 7.4* 7.8* 7.1*  HCT 22.8*  --  22.9* 21.3*  MCV 100.0  --  100.5* 101.4*  PLT 92*  --  122* 160    Cardiac Enzymes: No results for input(s): CKTOTAL, CKMB, CKMBINDEX, TROPONINI in the last 168 hours.  BNP: Invalid input(s): POCBNP  CBG: No results for input(s): GLUCAP in the last 168 hours.  Microbiology: Results for orders placed or performed in visit on 11/26/13  Urine culture     Status: None   Collection Time: 11/27/13  5:05 AM  Result Value Ref Range Status   Micro Text Report   Final       SOURCE: CLEAN CATCH    COMMENT                   MIXED BACTERIAL ORGANISMS   COMMENT                   RESULTS SUGGESTIVE OF CONTAMINATION   ANTIBIOTIC  Clostridium Difficile [facility])     Status: None   Collection Time: 11/29/13  9:51 AM  Result Value Ref Range Status   Micro Text Report   Final       C.DIFFICILE ANTIGEN       C.DIFFICILE GDH ANTIGEN : NEGATIVE   C.DIFFICILE TOXIN A/B     C.DIFFICILE TOXINS A AND B : NEGATIVE   INTERPRETATION            Negative for C. difficile.    ANTIBIOTIC                                                        Coagulation Studies: No results for input(s): LABPROT, INR in the last 72 hours.  Urinalysis: No results for input(s): COLORURINE, LABSPEC, PHURINE, GLUCOSEU, HGBUR, BILIRUBINUR, KETONESUR, PROTEINUR, UROBILINOGEN, NITRITE, LEUKOCYTESUR in the last 72 hours.  Invalid input(s): APPERANCEUR    Imaging: No results found.   Medications:     . carbamazepine  200 mg Oral BID  . cyanocobalamin  1,000 mcg Subcutaneous Daily  . divalproex  750 mg Oral Q12H  . docusate sodium  100 mg Oral BID  . feeding supplement (ENSURE ENLIVE)  1 Bottle Oral TID BM  . folic acid  1 mg Oral Daily  . furosemide  20 mg Oral Daily  . lactulose  20 g Oral TID  .  OLANZapine  5 mg Oral QHS  . pantoprazole  40 mg Oral Daily  . sodium chloride  3 mL Intravenous Q12H  . sodium chloride  1 g Oral BID WC   acetaminophen **OR** acetaminophen, butalbital-acetaminophen-caffeine, ondansetron **OR** ondansetron (ZOFRAN) IV  Assessment/ Plan:  54 y.o. male with hypertension, seizure disorder, chronic speech disorder, CKD stage II, anemia of CKD, SHPTH.  1.  Hyponatremia. 2.  CKD stage II 3.  Hypertension. 4.   Altered mental status.  Plan:  Continue on 1228mL fluid restriction.   continue salt tabs and lasix for now.   Pt previously responded very well to one dose of tolvaptan.  Lasix back to 20mg  po daily.   Will continue to follow progress,  Awaiting guardianship to be assumed by state and subsequently placement    LOS: 13 Eloise Picone 11/23/201611:57 AM

## 2015-11-10 LAB — TYPE AND SCREEN
ABO/RH(D): O POS
Antibody Screen: NEGATIVE
UNIT DIVISION: 0

## 2015-11-10 LAB — HEMOGLOBIN: Hemoglobin: 8.4 g/dL — ABNORMAL LOW (ref 13.0–18.0)

## 2015-11-10 LAB — OCCULT BLOOD X 1 CARD TO LAB, STOOL: FECAL OCCULT BLD: NEGATIVE

## 2015-11-10 LAB — SODIUM: SODIUM: 138 mmol/L (ref 135–145)

## 2015-11-10 MED ORDER — FUROSEMIDE 20 MG PO TABS
20.0000 mg | ORAL_TABLET | Freq: Every day | ORAL | Status: DC
  Administered 2015-11-10 – 2015-11-13 (×4): 20 mg via ORAL
  Filled 2015-11-10 (×3): qty 1

## 2015-11-10 MED ORDER — PANTOPRAZOLE SODIUM 40 MG PO TBEC
40.0000 mg | DELAYED_RELEASE_TABLET | Freq: Every day | ORAL | Status: DC
  Administered 2015-11-11 – 2015-11-18 (×8): 40 mg via ORAL
  Filled 2015-11-10 (×8): qty 1

## 2015-11-10 NOTE — Plan of Care (Signed)
Problem: Fluid Volume: Goal: Ability to maintain a balanced intake and output will improve Outcome: Progressing Plan of care progress: -Bed search still pending offer -pt eating and drinking very well -drinking ensure per orders -no complaints of pain, no distress or discomfort noted -pt up x1 assist -pt free from falls, call bell in reach, pt not attempting to get OOB unassisted -HGB 8.4, continue to monitor -stool negative occult blood

## 2015-11-10 NOTE — Progress Notes (Signed)
[facility]  ROUNDING NOTE   Subjective:  Apparently the patient's mental status appears to be waxing and waning. However currently per our evaluation he appears to be at his baseline mental status. He reports that he had breakfast this a.m. Serum sodium now normal at 135 on salt tablets and Lasix. Good urine output of 2.1 L noted.   Objective:  Vital signs in last 24 hours:  Temp:  [98.3 F (36.8 C)-99.7 F (37.6 C)] 99 F (37.2 C) (11/24 0503) Pulse Rate:  [70-91] 71 (11/24 0504) Resp:  [18] 18 (11/23 2102) BP: (86-109)/(46-56) 108/55 mmHg (11/24 0504) SpO2:  [98 %-100 %] 99 % (11/24 0503) Weight:  [57.153 kg (126 lb)] 57.153 kg (126 lb) (11/24 0500)  Weight change: -1.633 kg (-3 lb 9.6 oz) Filed Weights   11/08/15 0500 11/09/15 0413 11/10/15 0500  Weight: 59.149 kg (130 lb 6.4 oz) 58.786 kg (129 lb 9.6 oz) 57.153 kg (126 lb)    Intake/Output: I/O last 3 completed shifts: In: 764 [P.O.:480; Blood:284] Out: 2100 [Urine:2100]   Intake/Output this shift:     Physical Exam: General: Awake, alert, no acute distress.  Head: Laceration on forehead  Moist oral mucosal membranes  Eyes: Anicteric  Neck: Supple, trachea midline  Lungs:  Clear to auscultation  Heart: Regular rate and rhythm, no rubs  Abdomen:  Soft, nontender, BS present  Extremities:  trace peripheral edema.  Neurologic: Awake, alert, following commands  Skin: Forehead laceration       Basic Metabolic Panel:  Recent Labs Lab 11/05/15 0355 11/06/15 0433 11/07/15 0442 11/09/15 0509  NA 128* 127* 132* 135  K 4.7 3.8 3.8 4.2  CL 92* 91* 95* 101  CO2 31 30 30 30   GLUCOSE 88 83 84 84  BUN 16 16 28* 35*  CREATININE 0.91 0.97 1.06 1.01  CALCIUM 8.7* 8.5* 9.1 8.8*    Liver Function Tests: No results for input(s): AST, ALT, ALKPHOS, BILITOT, PROT, ALBUMIN in the last 168 hours. No results for input(s): LIPASE, AMYLASE in the last 168 hours.  Recent Labs Lab 11/03/15 0957  AMMONIA  30    CBC:  Recent Labs Lab 11/05/15 0355 11/06/15 0433 11/07/15 0442 11/09/15 0509 11/09/15 1603 11/09/15 2138  WBC 2.8*  --  3.0* 5.1  --   --   NEUTROABS  --   --  1.6  --   --   --   HGB 7.6* 7.4* 7.8* 7.1* 7.2* 7.9*  HCT 22.8*  --  22.9* 21.3* 21.6* 23.6*  MCV 100.0  --  100.5* 101.4*  --   --   PLT 92*  --  122* 160  --   --     Cardiac Enzymes: No results for input(s): CKTOTAL, CKMB, CKMBINDEX, TROPONINI in the last 168 hours.  BNP: Invalid input(s): POCBNP  CBG: No results for input(s): GLUCAP in the last 168 hours.  Microbiology: Results for orders placed or performed in visit on 11/26/13  Urine culture     Status: None   Collection Time: 11/27/13  5:05 AM  Result Value Ref Range Status   Micro Text Report   Final       SOURCE: CLEAN CATCH    COMMENT                   MIXED BACTERIAL ORGANISMS   COMMENT                   RESULTS SUGGESTIVE OF CONTAMINATION  ANTIBIOTIC                                                      Clostridium Difficile [facility])     Status: None   Collection Time: 11/29/13  9:51 AM  Result Value Ref Range Status   Micro Text Report   Final       C.DIFFICILE ANTIGEN       C.DIFFICILE GDH ANTIGEN : NEGATIVE   C.DIFFICILE TOXIN A/B     C.DIFFICILE TOXINS A AND B : NEGATIVE   INTERPRETATION            Negative for C. difficile.    ANTIBIOTIC                                                        Coagulation Studies: No results for input(s): LABPROT, INR in the last 72 hours.  Urinalysis: No results for input(s): COLORURINE, LABSPEC, PHURINE, GLUCOSEU, HGBUR, BILIRUBINUR, KETONESUR, PROTEINUR, UROBILINOGEN, NITRITE, LEUKOCYTESUR in the last 72 hours.  Invalid input(s): APPERANCEUR    Imaging: No results found.   Medications:     . carbamazepine  200 mg Oral BID  . cyanocobalamin  1,000 mcg Subcutaneous Daily  . divalproex  750 mg Oral Q12H  . docusate sodium  100 mg Oral BID  . feeding supplement (ENSURE ENLIVE)   1 Bottle Oral TID BM  . folic acid  1 mg Oral Daily  . OLANZapine  5 mg Oral QHS  . pantoprazole (PROTONIX) IV  40 mg Intravenous Q12H  . sodium chloride  3 mL Intravenous Q12H  . sodium chloride  1 g Oral BID WC   acetaminophen **OR** acetaminophen, butalbital-acetaminophen-caffeine, ondansetron **OR** ondansetron (ZOFRAN) IV  Assessment/ Plan:  54 y.o. male with hypertension, seizure disorder, chronic speech disorder, CKD stage II, anemia of CKD, SHPTH.  1.  Hyponatremia. 2.  CKD stage II 3.  Hypertension. 4.   Altered mental status.  Plan:  As before patient appears to be at his baseline mental status per our review.  He was previously coming to our office on a more regular basis and he appears to be in a similar mental status. The patient will continue on sodium chloride 1 g by mouth twice a day.  We will restart the patient on furosemide 20 mg by mouth daily. Unclear why this was stopped.  LOS: 14 Lasheka Kempner 11/24/20169:07 AM

## 2015-11-10 NOTE — Plan of Care (Addendum)
No c/o pain. VSS, afebrile. 1 unit PRBC last night HGB now 7.9. Bed search still pending offer  Problem: Safety: Goal: Ability to remain free from injury will improve Outcome: Progressing High fall risk. Bed alarm on, room near nurses station, hourly rounding w/ toileting offered. Safe environment provided.  Problem: Tissue Perfusion: Goal: Risk factors for ineffective tissue perfusion will decrease Outcome: Progressing Pt refuses SCD's and Ted hose. Refuses to remove panty hose brought from home.   Problem: Fluid Volume: Goal: Ability to maintain a balanced intake and output will improve Outcome: Progressing Fluid volume managed. VSS. Encouraged PO intake of fluids within 1270ml per day

## 2015-11-10 NOTE — Progress Notes (Signed)
[facility] [place] NAME: [name]    MR#:  [id number]  DATE OF BIRTH:  [birth year]   CHIEF COMPLAINT: Weakness  SUBJECTIVE:  Awake and alert. Confused at times. Feels comfortable, denies any pain. Hypotensive yesterday, Lasix was stopped , however, restarted today by nephrologist, patient was transfused with one unit of packed red blood cells after which his hemoglobin level as well as his blood pressure improved . Patient feels okay today, was encouraged to eat and the dose me that he would like to eat more and to improve physically so he can go home.    REVIEW OF SYSTEMS:  Review of systems  CONSTITUTIONAL: No fever, has weakness.  EYES: No blurred or double vision.  EARS, NOSE, AND THROAT: No tinnitus or ear pain.  RESPIRATORY: No SOB/Cough CARDIOVASCULAR: No chest pain, orthopnea, edema.  GASTROINTESTINAL: No nausea, vomiting, diarrhea or abdominal pain.  GENITOURINARY: No dysuria, hematuria.  ENDOCRINE: No polyuria, nocturia,  HEMATOLOGY: No anemia, easy bruising or bleeding SKIN: No rash or lesion. MUSCULOSKELETAL: No joint pain or arthritis.  NEUROLOGIC: No tingling, numbness, weakness.  PSYCHIATRY: No anxiety or depression.         DRUG ALLERGIES:  No Known Allergies  VITALS:  Blood pressure 108/55, pulse 71, temperature 99 F (37.2 C), temperature source Oral, resp. rate 18, height 5\' 3"  (1.6 m), weight 57.153 kg (126 lb), SpO2 99 %.  PHYSICAL EXAMINATION:  GENERAL:  54 y.o.-year-old patient lying in the bed with no acute distress.  EYES: Pupils equal, round, reactive to light and accommodation. No scleral icterus.  HEENT: dressings on head- after laceration last evening, normocephalic. NECK:  Supple, no jugular venous distention. No thyroid enlargement, no tenderness.  LUNGS: Normal breath sounds bilaterally, no wheezing, rales,rhonchi or crepitation. No use of accessory muscles of respiration.   CARDIOVASCULAR: S1, S2 normal. No murmurs, rubs, or gallops.  ABDOMEN: Soft, nontender, nondistended. Bowel sounds present. No organomegaly or mass.  EXTREMITIES: No pedal edema, cyanosis, or clubbing.  NEUROLOGIC: not follow commands. PSYCHIATRIC: The patient is awake and alert SKIN: No obvious rash, lesion, or ulcer.    LABORATORY PANEL:   CBC  Recent Labs Lab 11/09/15 0509  11/09/15 2138 11/10/15 0936  WBC 5.1  --   --   --   HGB 7.1*  < > 7.9* 8.4*  HCT 21.3*  < > 23.6*  --   PLT 160  --   --   --   < > = values in this interval not displayed. ------------------------------------------------------------------------------------------------------------------  Chemistries   Recent Labs Lab 11/09/15 0509 11/10/15 0936  NA 135 138  K 4.2  --   CL 101  --   CO2 30  --   GLUCOSE 84  --   BUN 35*  --   CREATININE 1.01  --   CALCIUM 8.8*  --    ------------------------------------------------------------------------------------------------------------------  Cardiac Enzymes No results for input(s): TROPONINI in the last 168 hours. ------------------------------------------------------------------------------------------------------------------  RADIOLOGY:  No results found.   ASSESSMENT AND PLAN:   # Hyponatremia due to SIADH -being continued on  Lasix and salt tablets, nephrologist recommended to continue both following sodium level closely Patient is on Depakote and Tegretol which could cause SIADH and hyponatremia Was on samsca, which was discontinued. On 1200 ml fluid restriction.  #Near syncope secondary to Symptomatic anemia with chronic abdominal pain Transfused 2 units of blood and repeat hemoglobin at 8.4 today, up from 7.1yesterday, no obvious bleeding,but  the patient was hypotensive.   Stool for occult blood was negative in the ED, repeated again Negative. Continue Protonix, changed back to by mouth  CT chest, abdomen and pelvis are negative except  for lower rib fractures.. EGD/Colonoscopy as OP with Dr. Allen Norris  is being planned.  # Hypotension, status post 1 factor of blood cell  transfusion . Follow patient's blood pressure readings closely, patient is on Lasix  # B12 and Folate deficiency. Replacement started  # Failure to thrive - unintentional weight loss of 30 pounds in 3 months. CT head is negative for any metastases. CT chest, abdomen and pelvis -no malignancy  Patient is scheduled for EGD and colonoscopy as OP. If not clear source, then likely will be due to psych , hyponatremia, etc.  # Metabolic encephalopathy - altered mental status Ct ehad negative  Normal tegretol and depakot level.The patient's mental status remains stable  # Partially healed lower rib fractures.  #. Essential tremors-continue home medication propranolol, unless blood pressure readings are low   #. Hypotension, off Norvasc  and ACE inhibitor  #. History of seizures On Depakote and Tegretol. Lactulose.   He has no capacity to make decisions at this point per Dr. Weber Cooks. No family member or POA.  SW working on placement at [facility]  All the records are reviewed and case discussed with Care Management/Social Workerr.  CODE STATUS: Full code  TOTAL TIME TAKING CARE OF THIS PATIENT:30 minutes.   D/C when bed available at [facility] on 11/10/2015 at 11:51 AM  Between 7am to 6pm - Pager - [phone] After 6pm go to [email or web address] - password [email or web address] [place] [facility]  Office  [phone]  CC: Primary care physician; [name], MD

## 2015-11-11 NOTE — Progress Notes (Signed)
[facility] [place] NAME: [name]    MR#:  [id number]  DATE OF BIRTH:  01/08/1961   CHIEF COMPLAINT: Weakness  SUBJECTIVE:  Awake and alert. . Was found to be hypotensive and anemic to 7.1 on 11/09/2015 transfused with improvement of hemoglobin to 8.4 on 24th of November. Feels comfortable. Denies any pain. States that he eats plenty and feels that he is getting stronger. He was felt to be incompetent by Dr. Weber Cooks and skilled nursing facility as well as guardianship was looked at by care management   REVIEW OF SYSTEMS:  Review of systems  CONSTITUTIONAL: No fever, has weakness.  EYES: No blurred or double vision.  EARS, NOSE, AND THROAT: No tinnitus or ear pain.  RESPIRATORY: No SOB/Cough CARDIOVASCULAR: No chest pain, orthopnea, edema.  GASTROINTESTINAL: No nausea, vomiting, diarrhea or abdominal pain.  GENITOURINARY: No dysuria, hematuria.  ENDOCRINE: No polyuria, nocturia,  HEMATOLOGY: No anemia, easy bruising or bleeding SKIN: No rash or lesion. MUSCULOSKELETAL: No joint pain or arthritis.  NEUROLOGIC: No tingling, numbness, weakness.  PSYCHIATRY: No anxiety or depression.         DRUG ALLERGIES:  No Known Allergies  VITALS:  Blood pressure 104/58, pulse 85, temperature 99.3 F (37.4 C), temperature source Oral, resp. rate 20, height 5\' 3"  (1.6 m), weight 57.788 kg (127 lb 6.4 oz), SpO2 98 %.  PHYSICAL EXAMINATION:  GENERAL:  54 y.o.-year-old patient lying in the bed with no acute distress.  EYES: Pupils equal, round, reactive to light and accommodation. No scleral icterus.  HEENT: dressings on head- after laceration last evening, normocephalic. NECK:  Supple, no jugular venous distention. No thyroid enlargement, no tenderness.  LUNGS: Normal breath sounds bilaterally, no wheezing, rales,rhonchi or crepitation. No use of accessory muscles of respiration.  CARDIOVASCULAR: S1, S2 normal. No murmurs,  rubs, or gallops.  ABDOMEN: Soft, nontender, nondistended. Bowel sounds present. No organomegaly or mass.  EXTREMITIES: No pedal edema, cyanosis, or clubbing.  NEUROLOGIC: not follow commands. PSYCHIATRIC: The patient is awake and alert SKIN: No obvious rash, lesion, or ulcer.    LABORATORY PANEL:   CBC  Recent Labs Lab 11/09/15 0509  11/09/15 2138 11/10/15 0936  WBC 5.1  --   --   --   HGB 7.1*  < > 7.9* 8.4*  HCT 21.3*  < > 23.6*  --   PLT 160  --   --   --   < > = values in this interval not displayed. ------------------------------------------------------------------------------------------------------------------  Chemistries   Recent Labs Lab 11/09/15 0509 11/10/15 0936  NA 135 138  K 4.2  --   CL 101  --   CO2 30  --   GLUCOSE 84  --   BUN 35*  --   CREATININE 1.01  --   CALCIUM 8.8*  --    ------------------------------------------------------------------------------------------------------------------  Cardiac Enzymes No results for input(s): TROPONINI in the last 168 hours. ------------------------------------------------------------------------------------------------------------------  RADIOLOGY:  No results found.   ASSESSMENT AND PLAN:   # Hyponatremia due to SIADH -being continued on  Lasix and salt tablets, nephrologist recommended to continue both following sodium level closely Patient is on Depakote and Tegretol which could cause SIADH and hyponatremia Was on samsca, which was discontinued. On 1200 ml fluid restriction. Following sodium level in the morning  #Near syncope secondary to Symptomatic anemia with chronic abdominal pain Transfused 2 units of blood and repeat hemoglobin at 8.4 yesterday, up from 7.1, no obvious bleeding, but  the patient was hypotensive.   Stool for occult blood was negative in the ED, repeated again Negative. Continue Protonix, changed back to by mouth  CT chest, abdomen and pelvis are negative except for lower  rib fractures.. EGD/Colonoscopy as OP with Dr. Allen Norris  is being planned.  # Hypotension, status post 2 units of blood cell  Transfusion, blood pressure has improved . Follow patient's blood pressure readings closely, patient is on Lasix  # B12 and Folate deficiency. Replacement started  # Failure to thrive - unintentional weight loss of 30 pounds in 3 months. CT head is negative for any metastases. CT chest, abdomen and pelvis -no malignancy  Patient is scheduled for EGD and colonoscopy as OP. If not clear source, then likely will be due to psych , hyponatremia, etc.  # Metabolic encephalopathy - altered mental status Ct ehad negative  Normal tegretol and depakot level.The patient's mental status remains stable  # Partially healed lower rib fractures.  #. Essential tremors-continue home medication propranolol, unless blood pressure readings are low   #. Hypotension, off Norvasc  and ACE inhibitor  #. History of seizures On Depakote and Tegretol. Lactulose.   He has no capacity to make decisions at this point per Dr. Weber Cooks. No family member or POA.  SW working on placement at [facility], she is ambulating with physical therapy and feels that he is overall getting stronger. He wants to go home.   All the records are reviewed and case discussed with Care Management/Social Workerr.  CODE STATUS: Full code  TOTAL TIME TAKING CARE OF THIS PATIENT: 35 minutes.  Discussed with care management D/C when bed available at [facility] on 11/11/2015 at 2:18 PM  Between 7am to 6pm - Pager - [phone] After 6pm go to [email or web address] - password [email or web address] [place] [facility]  Office  [phone]  CC: Primary care physician; [name], MD

## 2015-11-11 NOTE — Plan of Care (Signed)
Problem: Fluid Volume: Goal: Ability to maintain a balanced intake and output will improve Outcome: Progressing Uneventful shift, Patient continues to eat well, no complaints working with PT continuing bed search

## 2015-11-11 NOTE — Progress Notes (Signed)
[facility]  ROUNDING NOTE   Subjective:  Na yesterday was 138.   Pt remains high fall risk. Forehead laceration healing.  Objective:  Vital signs in last 24 hours:  Temp:  [98.6 F (37 C)-99.3 F (37.4 C)] 98.8 F (37.1 C) (11/25 0506) Pulse Rate:  [66-82] 66 (11/25 0506) Resp:  [18] 18 (11/25 0506) BP: (85-108)/(49-62) 106/49 mmHg (11/25 0506) SpO2:  [97 %-100 %] 100 % (11/25 0506) Weight:  [57.788 kg (127 lb 6.4 oz)] 57.788 kg (127 lb 6.4 oz) (11/25 0500)  Weight change: 0.635 kg (1 lb 6.4 oz) Filed Weights   11/09/15 0413 11/10/15 0500 11/11/15 0500  Weight: 58.786 kg (129 lb 9.6 oz) 57.153 kg (126 lb) 57.788 kg (127 lb 6.4 oz)    Intake/Output: I/O last 3 completed shifts: In: 1004 [P.O.:720; Blood:284] Out: [id number] [Urine:751; Stool:1]   Intake/Output this shift:     Physical Exam: General: Awake, alert, no acute distress.  Head: Laceration on forehead  Moist oral mucosal membranes  Eyes: Anicteric  Neck: Supple, trachea midline  Lungs:  Clear to auscultation normal effort  Heart: Regular rate and rhythm, no rubs  Abdomen:  Soft, nontender, BS present  Extremities:  trace peripheral edema.  Neurologic: Awake, alert, following commands  Skin: Forehead laceration healing       Basic Metabolic Panel:  Recent Labs Lab 11/05/15 0355 11/06/15 0433 11/07/15 0442 11/09/15 0509 11/10/15 0936  NA 128* 127* 132* 135 138  K 4.7 3.8 3.8 4.2  --   CL 92* 91* 95* 101  --   CO2 31 30 30 30   --   GLUCOSE 88 83 84 84  --   BUN 16 16 28* 35*  --   CREATININE 0.91 0.97 1.06 1.01  --   CALCIUM 8.7* 8.5* 9.1 8.8*  --     Liver Function Tests: No results for input(s): AST, ALT, ALKPHOS, BILITOT, PROT, ALBUMIN in the last 168 hours. No results for input(s): LIPASE, AMYLASE in the last 168 hours. No results for input(s): AMMONIA in the last 168 hours.  CBC:  Recent Labs Lab 11/05/15 0355  11/07/15 0442 11/09/15 0509 11/09/15 1603 11/09/15 2138  11/10/15 0936  WBC 2.8*  --  3.0* 5.1  --   --   --   NEUTROABS  --   --  1.6  --   --   --   --   HGB 7.6*  < > 7.8* 7.1* 7.2* 7.9* 8.4*  HCT 22.8*  --  22.9* 21.3* 21.6* 23.6*  --   MCV 100.0  --  100.5* 101.4*  --   --   --   PLT 92*  --  122* 160  --   --   --   < > = values in this interval not displayed.  Cardiac Enzymes: No results for input(s): CKTOTAL, CKMB, CKMBINDEX, TROPONINI in the last 168 hours.  BNP: Invalid input(s): POCBNP  CBG: No results for input(s): GLUCAP in the last 168 hours.  Microbiology: Results for orders placed or performed in visit on 11/26/13  Urine culture     Status: None   Collection Time: 11/27/13  5:05 AM  Result Value Ref Range Status   Micro Text Report   Final       SOURCE: CLEAN CATCH    COMMENT                   MIXED BACTERIAL ORGANISMS   COMMENT  RESULTS SUGGESTIVE OF CONTAMINATION   ANTIBIOTIC                                                      Clostridium Difficile [facility])     Status: None   Collection Time: 11/29/13  9:51 AM  Result Value Ref Range Status   Micro Text Report   Final       C.DIFFICILE ANTIGEN       C.DIFFICILE GDH ANTIGEN : NEGATIVE   C.DIFFICILE TOXIN A/B     C.DIFFICILE TOXINS A AND B : NEGATIVE   INTERPRETATION            Negative for C. difficile.    ANTIBIOTIC                                                        Coagulation Studies: No results for input(s): LABPROT, INR in the last 72 hours.  Urinalysis: No results for input(s): COLORURINE, LABSPEC, PHURINE, GLUCOSEU, HGBUR, BILIRUBINUR, KETONESUR, PROTEINUR, UROBILINOGEN, NITRITE, LEUKOCYTESUR in the last 72 hours.  Invalid input(s): APPERANCEUR    Imaging: No results found.   Medications:     . carbamazepine  200 mg Oral BID  . cyanocobalamin  1,000 mcg Subcutaneous Daily  . divalproex  750 mg Oral Q12H  . docusate sodium  100 mg Oral BID  . feeding supplement (ENSURE ENLIVE)  1 Bottle Oral TID BM  . folic acid   1 mg Oral Daily  . furosemide  20 mg Oral Daily  . OLANZapine  5 mg Oral QHS  . pantoprazole  40 mg Oral Daily  . sodium chloride  3 mL Intravenous Q12H  . sodium chloride  1 g Oral BID WC   acetaminophen **OR** acetaminophen, butalbital-acetaminophen-caffeine, ondansetron **OR** ondansetron (ZOFRAN) IV  Assessment/ Plan:  54 y.o. male with hypertension, seizure disorder, chronic speech disorder, CKD stage II, anemia of CKD, SHPTH.  1.  Hyponatremia. 2.  CKD stage II 3.  Hypertension. 4.   Altered mental status.  Plan:  Na has improved significantly.  Na currently 138.  Repeat serum sodium today as well as kidney function.  Pt has remained off antihypertensives at this time as blood pressure actually low now.  Continue to monitor sodium and renal function.  Pt with high fall risk, placement has been recommended.  Care management working towards this.   LOS: 15 Mickey Hebel 11/25/20168:54 AM

## 2015-11-11 NOTE — Progress Notes (Signed)
Physical Therapy Treatment Patient Details Name: [name] MRN: [id number] DOB: [birth year] Today's Date: 11/11/2015    History of Present Illness Pt is a 54 y.o. male admitted 10/27/15 with abdominal pain, general weakness, and lightheadedness x5 days.  Pt s/p 1 unit PRBC's d/t anemia.  Pt s/p fall 10/30/15 with lacerations on head.  CT of head negative for fx and acute intracranial findings (positive for small midline frontal scalp contusion).  Pt also with multiple B lower rib fx's (present prior to current admission).    PT Comments    Pt continues to be very tremulous in general with entire body during session's activities.  D/t this, pt's walker coming off floor at times during ambulation and pt very unsteady requiring intermittent assist for balance to prevent falls.  Pt continues to be a high fall risk and does not appear safe to discharge home alone.   Follow Up Recommendations  SNF     Equipment Recommendations  Rolling walker with 5" wheels    Recommendations for Other Services       Precautions / Restrictions Precautions Precautions: Fall Restrictions Weight Bearing Restrictions: No    Mobility  Bed Mobility Overal bed mobility: Needs Assistance Bed Mobility: Supine to Sit;Sit to Supine     Supine to sit: Supervision;HOB elevated Sit to supine: Supervision;HOB elevated   General bed mobility comments: SBA and vc's for safety required; pt requiring extra time and vc's for technique to scoot up in bed end of session  Transfers Overall transfer level: Needs assistance Equipment used: Rolling walker (2 wheeled) Transfers: Sit to/from Stand Sit to Stand: Min assist         General transfer comment: pt tremulous and unsteady; walker coming off floor at times; pt requiring vc's for safety with transfer  Ambulation/Gait Ambulation/Gait assistance: Min assist Ambulation Distance (Feet): 200 Feet Assistive device: Rolling walker (2 wheeled)       General  Gait Details: step through pattern but gait pattern very inconsistent with step height/length/foot placement; walker coming off floor at times; impulsive at times; inconsistent gait speed; requiring assist to steady d/t intermittent loss of balance   Stairs            Wheelchair Mobility    Modified Rankin (Stroke Patients Only)       Balance Overall balance assessment: Needs assistance Sitting-balance support: Bilateral upper extremity supported;Feet supported Sitting balance-Leahy Scale: Fair     Standing balance support: Bilateral upper extremity supported (on RW) Standing balance-Leahy Scale: Poor Standing balance comment: posterior and lateral loss of balance                    Cognition Arousal/Alertness: Awake/alert Behavior During Therapy: Impulsive Overall Cognitive Status:  (Able to follow commands)                      Exercises   Performed semi-supine B LE therapeutic exercise x 15 reps:  Ankle pumps (AROM B LE's); quad sets x3 second holds (AROM B LE's); glute squeezes x3 second holds (AROM B); SAQ's (AROM R; AROM L); heelslides (AROM R; AROM L), hip abd/adduction (AROM R; AROM L).  Pt required vc's and tactile cues for correct technique with exercises.     General Comments   Nursing cleared pt for participation in physical therapy.  Pt agreeable to PT session.      Pertinent Vitals/Pain Pain Assessment: No/denies pain  Vitals stable and WFL throughout treatment session.    Home  Living                      Prior Function            PT Goals (current goals can now be found in the care plan section) Acute Rehab PT Goals Patient Stated Goal: to improve balance PT Goal Formulation: With patient Time For Goal Achievement: 11/21/15 Potential to Achieve Goals: Good Progress towards PT goals: Progressing toward goals    Frequency  Min 2X/week    PT Plan Current plan remains appropriate    Co-evaluation              End of Session Equipment Utilized During Treatment: Gait belt Activity Tolerance: Patient tolerated treatment well Patient left: in bed;with call bell/phone within reach;with bed alarm set     Time: [id number] PT Time Calculation (min) (ACUTE ONLY): 14 min  Charges:  $Therapeutic Activity: 8-22 mins                    G CodesLeitha Bleak 2015/11/20, 3:33 PM Leitha Bleak, [place]

## 2015-11-11 NOTE — Plan of Care (Signed)
Problem: Safety: Goal: Ability to remain free from injury will improve Outcome: Progressing Patient is a high fall risk due to frequent falls.  Bed in lowest position with wheels locked and call bell within reach.  Patient compliant with calling for assistance. Bed alarm on throughout shift and room near nurses station, hourly rounding w/ toileting offered. Patient without injury this shift.  Problem: Tissue Perfusion: Goal: Risk factors for ineffective tissue perfusion will decrease Outcome: Progressing Pt refuses SCD's and Ted hose. Patient also refuses to remove panty hose brought from home. Hgb at 8.4. VSS BP 108/51 mmHg  Pulse 82  Temp(Src) 99.3 F (37.4 C) (Oral)  Resp 18  Ht 5\' 3"  (1.6 m)  Wt 126 lb (57.153 kg)  BMI 22.33 kg/m2  SpO2 97%  Problem: Fluid Volume: Goal: Ability to maintain a balanced intake and output will improve Outcome: Progressing Patient encouraged PO intake of fluids this shift.  Patient did drink ensure provided to him.

## 2015-11-11 NOTE — Progress Notes (Signed)
Nutrition Follow-up  DOCUMENTATION CODES:   Severe malnutrition in context of chronic illness  INTERVENTION:   Meals and Snacks: Cater to patient preferences. Nsg providing assistance as needed.  Medical Food Supplement Therapy: Continue as ordered   NUTRITION DIAGNOSIS:   Malnutrition related to chronic illness as evidenced by percent weight loss, severe depletion of muscle mass, moderate depletion of body fat.  GOAL:   Patient will meet greater than or equal to 90% of their needs  MONITOR:    (Energy Intake, Anthropometrics, Electrolyte and renal Profile, Digestive system)   ASSESSMENT:   Pt fell per Nsg on 11/12 with laceration to head. Pt since drowsy, unresponsive at times; currently with sitter. Neurology following; per note  AMS secondary to anemia, hyponatremia, FTT and delirium.  Pt scheduled for EGD/colonoscopy 11/15 however cancelled as pt too drowsy to take prep.  Nephrology following for hyponatremia.  Per MD note, SW working on placement at [facility], discharge when bed available  Diet Order:  Diet - low sodium heart healthy Diet regular Room service appropriate?: Yes; Fluid consistency:: Thin; Fluid restriction:: 1200 mL Fluid    Current Nutrition: Pt with a very good appetite. Pt ate 100% of breakfast with CNA Chelsie this am including 100% of Ensure. Recorded po intake 96% of meals on average.   Gastrointestinal Profile: Last BM: 11/10/2015   Scheduled Medications:  . carbamazepine  200 mg Oral BID  . divalproex  750 mg Oral Q12H  . docusate sodium  100 mg Oral BID  . feeding supplement (ENSURE ENLIVE)  1 Bottle Oral TID BM  . folic acid  1 mg Oral Daily  . furosemide  20 mg Oral Daily  . OLANZapine  5 mg Oral QHS  . pantoprazole  40 mg Oral Daily  . sodium chloride  3 mL Intravenous Q12H  . sodium chloride  1 g Oral BID WC    Electrolyte/Renal Profile and Glucose Profile:   Recent Labs Lab 11/06/15 0433 11/07/15 0442 11/09/15 0509  11/10/15 0936  NA 127* 132* 135 138  K 3.8 3.8 4.2  --   CL 91* 95* 101  --   CO2 30 30 30   --   BUN 16 28* 35*  --   CREATININE 0.97 1.06 1.01  --   CALCIUM 8.5* 9.1 8.8*  --   GLUCOSE 83 84 84  --    Protein Profile: No results for input(s): ALBUMIN in the last 168 hours.    Weight Trend since Admission: Filed Weights   11/09/15 0413 11/10/15 0500 11/11/15 0500  Weight: 129 lb 9.6 oz (58.786 kg) 126 lb (57.153 kg) 127 lb 6.4 oz (57.788 kg)    BMI:  Body mass index is 22.57 kg/(m^2).  Estimated Nutritional Needs:   Kcal:  BEE: 1280kcals, TEE; (IF 1.1-1.3)(AF 1.2) 1690-2000kcals  Protein:  44-56g protein (0.8-1.0g/kg)  Fluid:  1400-1659mL of fluid (25-78mL/kg)  EDUCATION NEEDS:   No education needs identified at this time   [place], RD, LDN Pager [phone]

## 2015-11-12 ENCOUNTER — Inpatient Hospital Stay: Payer: Self-pay

## 2015-11-12 LAB — BASIC METABOLIC PANEL
Anion gap: 5 (ref 5–15)
BUN: 36 mg/dL — AB (ref 6–20)
CHLORIDE: 103 mmol/L (ref 101–111)
CO2: 31 mmol/L (ref 22–32)
CREATININE: 1.13 mg/dL (ref 0.61–1.24)
Calcium: 9.4 mg/dL (ref 8.9–10.3)
Glucose, Bld: 89 mg/dL (ref 65–99)
POTASSIUM: 4.5 mmol/L (ref 3.5–5.1)
SODIUM: 139 mmol/L (ref 135–145)

## 2015-11-12 NOTE — Plan of Care (Signed)
Problem: Fluid Volume: Goal: Ability to maintain a balanced intake and output will improve Outcome: Progressing Uneventful shift, pt calling out for all needs,  no complaints  continuing bed search

## 2015-11-12 NOTE — Plan of Care (Signed)
Problem: Education: Goal: Knowledge of [facility] Education information/materials will improve Outcome: Progressing Pt calls out for assist to br at [facility].  Denies  Burning when voiding  Problem: Safety: Goal: Ability to remain free from injury will improve Outcome: Progressing Remains free of injury for this shift. Moves well  When up to [email or web address].cont to have tremors. Requiring assist to eat meals.  tol meals and meds well.  Problem: Tissue Perfusion: Goal: Risk factors for ineffective tissue perfusion will decrease Outcome: Progressing No s/s vte or dvt. C/o discomfort  Left thumb when its turned the wrong way. md aware and ordered a hand xray which showed no fracture.  Problem: Fluid Volume: Goal: Ability to maintain a balanced intake and output will improve Outcome: Progressing tol diet well.

## 2015-11-12 NOTE — Progress Notes (Signed)
[facility]  ROUNDING NOTE   Subjective:  Na up to 139 at the moment. PT notes reviewed. Still having significant tremors Doesn't appear to be safe for home.   Objective:  Vital signs in last 24 hours:  Temp:  [98.5 F (36.9 C)-99.3 F (37.4 C)] 98.5 F (36.9 C) (11/26 1045) Pulse Rate:  [68-85] 79 (11/26 1045) Resp:  [18-20] 18 (11/26 0508) BP: (104-115)/(55-63) 115/63 mmHg (11/26 1045) SpO2:  [98 %-100 %] 99 % (11/26 1045) Weight:  [56.972 kg (125 lb 9.6 oz)] 56.972 kg (125 lb 9.6 oz) (11/26 0508)  Weight change: -0.816 kg (-1 lb 12.8 oz) Filed Weights   11/10/15 0500 11/11/15 0500 11/12/15 0508  Weight: 57.153 kg (126 lb) 57.788 kg (127 lb 6.4 oz) 56.972 kg (125 lb 9.6 oz)    Intake/Output: I/O last 3 completed shifts: In: 42 [P.O.:960] Out: 526 [Urine:526]   Intake/Output this shift:  Total I/O In: 480 [P.O.:480] Out: -   Physical Exam: General: Awake, alert, no acute distress.  Head: Laceration on forehead  Moist oral mucosal membranes  Eyes: Anicteric  Neck: Supple, trachea midline  Lungs:  Clear to auscultation normal effort  Heart: Regular rate and rhythm, no rubs  Abdomen:  Soft, nontender, BS present  Extremities:  trace peripheral edema.  Neurologic: Awake, alert, following commands  Skin: Forehead laceration healing       Basic Metabolic Panel:  Recent Labs Lab 11/06/15 0433 11/07/15 0442 11/09/15 0509 11/10/15 0936 11/12/15 0550  NA 127* 132* 135 138 139  K 3.8 3.8 4.2  --  4.5  CL 91* 95* 101  --  103  CO2 30 30 30   --  31  GLUCOSE 83 84 84  --  89  BUN 16 28* 35*  --  36*  CREATININE 0.97 1.06 1.01  --  1.13  CALCIUM 8.5* 9.1 8.8*  --  9.4    Liver Function Tests: No results for input(s): AST, ALT, ALKPHOS, BILITOT, PROT, ALBUMIN in the last 168 hours. No results for input(s): LIPASE, AMYLASE in the last 168 hours. No results for input(s): AMMONIA in the last 168 hours.  CBC:  Recent Labs Lab 11/07/15 0442  11/09/15 0509 11/09/15 1603 11/09/15 2138 11/10/15 0936  WBC 3.0* 5.1  --   --   --   NEUTROABS 1.6  --   --   --   --   HGB 7.8* 7.1* 7.2* 7.9* 8.4*  HCT 22.9* 21.3* 21.6* 23.6*  --   MCV 100.5* 101.4*  --   --   --   PLT 122* 160  --   --   --     Cardiac Enzymes: No results for input(s): CKTOTAL, CKMB, CKMBINDEX, TROPONINI in the last 168 hours.  BNP: Invalid input(s): POCBNP  CBG: No results for input(s): GLUCAP in the last 168 hours.  Microbiology: Results for orders placed or performed in visit on 11/26/13  Urine culture     Status: None   Collection Time: 11/27/13  5:05 AM  Result Value Ref Range Status   Micro Text Report   Final       SOURCE: CLEAN CATCH    COMMENT                   MIXED BACTERIAL ORGANISMS   COMMENT                   RESULTS SUGGESTIVE OF CONTAMINATION   ANTIBIOTIC  Clostridium Difficile [facility])     Status: None   Collection Time: 11/29/13  9:51 AM  Result Value Ref Range Status   Micro Text Report   Final       C.DIFFICILE ANTIGEN       C.DIFFICILE GDH ANTIGEN : NEGATIVE   C.DIFFICILE TOXIN A/B     C.DIFFICILE TOXINS A AND B : NEGATIVE   INTERPRETATION            Negative for C. difficile.    ANTIBIOTIC                                                        Coagulation Studies: No results for input(s): LABPROT, INR in the last 72 hours.  Urinalysis: No results for input(s): COLORURINE, LABSPEC, PHURINE, GLUCOSEU, HGBUR, BILIRUBINUR, KETONESUR, PROTEINUR, UROBILINOGEN, NITRITE, LEUKOCYTESUR in the last 72 hours.  Invalid input(s): APPERANCEUR    Imaging: No results found.   Medications:     . carbamazepine  200 mg Oral BID  . divalproex  750 mg Oral Q12H  . docusate sodium  100 mg Oral BID  . feeding supplement (ENSURE ENLIVE)  1 Bottle Oral TID BM  . folic acid  1 mg Oral Daily  . furosemide  20 mg Oral Daily  . OLANZapine  5 mg Oral QHS  . pantoprazole  40 mg Oral  Daily  . sodium chloride  3 mL Intravenous Q12H  . sodium chloride  1 g Oral BID WC   acetaminophen **OR** acetaminophen, butalbital-acetaminophen-caffeine, ondansetron **OR** ondansetron (ZOFRAN) IV  Assessment/ Plan:  54 y.o. male with hypertension, seizure disorder, chronic speech disorder, CKD stage II, anemia of CKD, SHPTH.  1.  Hyponatremia. 2.  CKD stage II 3.  Hypertension. 4.   Altered mental status.  Plan:  Na normalized at present at 139.  Pt still having tremors spontaneously.  PT notes reviewed.  Pt doesn't appear to be safe for home at present.  Continue lasix 20mg  po daily and salt tabs 1 gram PO BID as this regimen appears to be functioning well.  Continue to periodically monitor Na.  Placement efforts currently pending.     LOS: 16 Chyrel Taha 11/26/201611:22 AM

## 2015-11-13 LAB — SODIUM: SODIUM: 140 mmol/L (ref 135–145)

## 2015-11-13 NOTE — Progress Notes (Signed)
[facility] [place] NAME: [name]    MR#:  [id number]  DATE OF BIRTH:  Jul 23, 1961   CHIEF COMPLAINT: Weakness  SUBJECTIVE:  Awake and alert. . Was found to be hypotensive and anemic to 7.1 on 11/09/2015 transfused with improvement of hemoglobin to 8.4 on 24th of November. Feels comfortable. Denies any pain. States that he eats plenty and feels that he is getting stronger. He was felt to be incompetent by Dr. Weber Cooks and skilled nursing facility as well as guardianship was looked at by care management. Patient feels comfortable today. Denies any pain. Was to go home. Denies any pain in his left hand. X-ray of left thumb was unremarkable   REVIEW OF SYSTEMS:  Review of systems  CONSTITUTIONAL: No fever, has weakness.  EYES: No blurred or double vision.  EARS, NOSE, AND THROAT: No tinnitus or ear pain.  RESPIRATORY: No SOB/Cough CARDIOVASCULAR: No chest pain, orthopnea, edema.  GASTROINTESTINAL: No nausea, vomiting, diarrhea or abdominal pain.  GENITOURINARY: No dysuria, hematuria.  ENDOCRINE: No polyuria, nocturia,  HEMATOLOGY: No anemia, easy bruising or bleeding SKIN: No rash or lesion. MUSCULOSKELETAL: No joint pain or arthritis.  NEUROLOGIC: No tingling, numbness, weakness.  PSYCHIATRY: No anxiety or depression.         DRUG ALLERGIES:  No Known Allergies  VITALS:  Blood pressure 124/67, pulse 94, temperature 98.9 F (37.2 C), temperature source Oral, resp. rate 18, height 5\' 3"  (1.6 m), weight 56.246 kg (124 lb), SpO2 100 %.  PHYSICAL EXAMINATION:  GENERAL:  54 y.o.-year-old patient lying in the bed with no acute distress.  EYES: Pupils equal, round, reactive to light and accommodation. No scleral icterus.  HEENT: dressings on head- after laceration last evening, normocephalic. NECK:  Supple, no jugular venous distention. No thyroid enlargement, no tenderness.  LUNGS: Normal breath sounds bilaterally,  no wheezing, rales,rhonchi or crepitation. No use of accessory muscles of respiration.  CARDIOVASCULAR: S1, S2 normal. No murmurs, rubs, or gallops.  ABDOMEN: Soft, nontender, nondistended. Bowel sounds present. No organomegaly or mass.  EXTREMITIES: No pedal edema, cyanosis, or clubbing.  NEUROLOGIC: not follow commands. PSYCHIATRIC: The patient is awake and alert SKIN: No obvious rash, lesion, or ulcer.    LABORATORY PANEL:   CBC  Recent Labs Lab 11/09/15 0509  11/09/15 2138 11/10/15 0936  WBC 5.1  --   --   --   HGB 7.1*  < > 7.9* 8.4*  HCT 21.3*  < > 23.6*  --   PLT 160  --   --   --   < > = values in this interval not displayed. ------------------------------------------------------------------------------------------------------------------  Chemistries   Recent Labs Lab 11/12/15 0550 11/13/15 0554  NA 139 140  K 4.5  --   CL 103  --   CO2 31  --   GLUCOSE 89  --   BUN 36*  --   CREATININE 1.13  --   CALCIUM 9.4  --    ------------------------------------------------------------------------------------------------------------------  Cardiac Enzymes No results for input(s): TROPONINI in the last 168 hours. ------------------------------------------------------------------------------------------------------------------  RADIOLOGY:  Dg Hand 2 View Left  11/12/2015  CLINICAL DATA:  Fall 10/30/2015, left thumb pain EXAM: LEFT HAND - 2 VIEW COMPARISON:  None. FINDINGS: Two views of the left hand submitted. No acute fracture is noted. Minimal subluxation of first metacarpal phalangeal joint mild be chronic in nature. There is narrowing of radiocarpal joint space. IMPRESSION: No acute fracture. Minimal subluxation of first carpometacarpal joint probable  chronic in nature. Narrowing of radiocarpal joint space. Electronically Signed   By: Lahoma Crocker M.D.   On: 11/12/2015 17:02     ASSESSMENT AND PLAN:   # Hyponatremia due to SIADH -of  Lasix and being continued on  salt tablets, appreciate nephrologist input,  following sodium level closely Patient is on Depakote and Tegretol which could cause SIADH and hyponatremia Was on samsca, which was discontinued. On 1200 ml fluid restriction.   #Near syncope secondary to Symptomatic anemia with chronic abdominal pain Transfused 2 units of blood and repeated hemoglobin at 8.4 , up from 7.1, no obvious bleeding, but  the patient was hypotensive.   Stool for occult blood was negative in the ED, repeated again Negative. Continue Protonix, changed back to by mouth  CT chest, abdomen and pelvis are negative except for lower rib fractures.. EGD/Colonoscopy as OP with Dr. Allen Norris  is being planned.  # Hypotension, status post 2 units of blood cell  Transfusion, blood pressure has improved . Follow patient's blood pressure readings closely, patient is off Lasix, off Norvasc  and ACE inhibitor  # B12 and Folate deficiency. Replacement started  # Failure to thrive - unintentional weight loss of 30 pounds in 3 months. CT head is negative for any metastases. CT chest, abdomen and pelvis -no malignancy  Patient is scheduled for EGD and colonoscopy as OP. If not clear source, then likely will be due to psych , hyponatremia, etc.  # Metabolic encephalopathy Ct ehad negative  Normal tegretol and depakot level.The patient's mental status remains stable. He has no capacity to make decisions at this point per Dr. Weber Cooks.No family member or POA.  # Partially healed lower rib fractures.  #. Essential tremors-continue home propranolol, unless blood pressure readings are low . Patient is not safe to be discharged home, will be discharged to skilled nursing facility as soon as bed is available, guardianship is pursued by social workers  #. History of seizures On Depakote and Tegretol. .      SW working on placement at SNF, the patient is ambulating with physical therapy and feels that he is overall getting stronger. He  wants to go home, but that has continuous tremors, which makes him unsafe to return back home.   All the records are reviewed and case discussed with Care Management/Social Workerr.  CODE STATUS: Full code  TOTAL TIME TAKING CARE OF THIS PATIENT: 35 minutes.     Theodoro Grist M.D on 11/13/2015 at 3:57 PM  Between 7am to 6pm - Pager - [phone] After 6pm go to [email or web address] - password [email or web address] [facility]  Office  [phone]  CC: Primary care physician; [name], MD

## 2015-11-13 NOTE — Plan of Care (Signed)
Problem: Safety: Goal: Ability to remain free from injury will improve Patient remains high fall risk, bed alarm in place, room near nurses station, pt encourged to call with any needs.   Problem: Fluid Volume: Goal: Ability to maintain a balanced intake and output will improve Outcome: Progressing Uneventful shift, pt resting comfortably, up to [facility] with one assit, requires feeding at meals r/t tremors, awating placement

## 2015-11-13 NOTE — Progress Notes (Signed)
[facility]  ROUNDING NOTE   Subjective:  Serum sodium 140 today. Serum bicarbonate was up to 31. BUN also slightly up to 36. Lasix therefore held for now.   Objective:  Vital signs in last 24 hours:  Temp:  [98.5 F (36.9 C)-98.6 F (37 C)] 98.6 F (37 C) (11/27 0627) Pulse Rate:  [62-79] 62 (11/27 0627) Resp:  [18-19] 18 (11/27 0627) BP: (104-115)/(47-63) 104/47 mmHg (11/27 0627) SpO2:  [99 %-100 %] 100 % (11/27 0627) Weight:  [56.246 kg (124 lb)] 56.246 kg (124 lb) (11/27 0346)  Weight change: -0.726 kg (-1 lb 9.6 oz) Filed Weights   11/11/15 0500 11/12/15 0508 11/13/15 0346  Weight: 57.788 kg (127 lb 6.4 oz) 56.972 kg (125 lb 9.6 oz) 56.246 kg (124 lb)    Intake/Output: I/O last 3 completed shifts: In: 1200 [P.O.:1200] Out: 250 [Urine:250]   Intake/Output this shift:  Total I/O In: 483 [P.O.:480; I.V.:3] Out: -   Physical Exam: General: Awake, alert, no acute distress.  Head: Laceration on forehead  Moist oral mucosal membranes  Eyes: Anicteric  Neck: Supple, trachea midline  Lungs:  Clear to auscultation normal effort  Heart: Regular rate and rhythm, no rubs  Abdomen:  Soft, nontender, BS present  Extremities:  trace peripheral edema.  Neurologic: Awake, alert, following commands  Skin: Forehead laceration healing       Basic Metabolic Panel:  Recent Labs Lab 11/07/15 0442 11/09/15 0509 11/10/15 0936 11/12/15 0550 11/13/15 0554  NA 132* 135 138 139 140  K 3.8 4.2  --  4.5  --   CL 95* 101  --  103  --   CO2 30 30  --  31  --   GLUCOSE 84 84  --  89  --   BUN 28* 35*  --  36*  --   CREATININE 1.06 1.01  --  1.13  --   CALCIUM 9.1 8.8*  --  9.4  --     Liver Function Tests: No results for input(s): AST, ALT, ALKPHOS, BILITOT, PROT, ALBUMIN in the last 168 hours. No results for input(s): LIPASE, AMYLASE in the last 168 hours. No results for input(s): AMMONIA in the last 168 hours.  CBC:  Recent Labs Lab 11/07/15 0442  11/09/15 0509 11/09/15 1603 11/09/15 2138 11/10/15 0936  WBC 3.0* 5.1  --   --   --   NEUTROABS 1.6  --   --   --   --   HGB 7.8* 7.1* 7.2* 7.9* 8.4*  HCT 22.9* 21.3* 21.6* 23.6*  --   MCV 100.5* 101.4*  --   --   --   PLT 122* 160  --   --   --     Cardiac Enzymes: No results for input(s): CKTOTAL, CKMB, CKMBINDEX, TROPONINI in the last 168 hours.  BNP: Invalid input(s): POCBNP  CBG: No results for input(s): GLUCAP in the last 168 hours.  Microbiology: Results for orders placed or performed in visit on 11/26/13  Urine culture     Status: None   Collection Time: 11/27/13  5:05 AM  Result Value Ref Range Status   Micro Text Report   Final       SOURCE: CLEAN CATCH    COMMENT                   MIXED BACTERIAL ORGANISMS   COMMENT  RESULTS SUGGESTIVE OF CONTAMINATION   ANTIBIOTIC                                                      Clostridium Difficile [facility])     Status: None   Collection Time: 11/29/13  9:51 AM  Result Value Ref Range Status   Micro Text Report   Final       C.DIFFICILE ANTIGEN       C.DIFFICILE GDH ANTIGEN : NEGATIVE   C.DIFFICILE TOXIN A/B     C.DIFFICILE TOXINS A AND B : NEGATIVE   INTERPRETATION            Negative for C. difficile.    ANTIBIOTIC                                                        Coagulation Studies: No results for input(s): LABPROT, INR in the last 72 hours.  Urinalysis: No results for input(s): COLORURINE, LABSPEC, PHURINE, GLUCOSEU, HGBUR, BILIRUBINUR, KETONESUR, PROTEINUR, UROBILINOGEN, NITRITE, LEUKOCYTESUR in the last 72 hours.  Invalid input(s): APPERANCEUR    Imaging: Dg Hand 2 View Left  11/12/2015  CLINICAL DATA:  Fall 10/30/2015, left thumb pain EXAM: LEFT HAND - 2 VIEW COMPARISON:  None. FINDINGS: Two views of the left hand submitted. No acute fracture is noted. Minimal subluxation of first metacarpal phalangeal joint mild be chronic in nature. There is narrowing of radiocarpal joint  space. IMPRESSION: No acute fracture. Minimal subluxation of first carpometacarpal joint probable chronic in nature. Narrowing of radiocarpal joint space. Electronically Signed   By: Lahoma Crocker M.D.   On: 11/12/2015 17:02     Medications:     . carbamazepine  200 mg Oral BID  . divalproex  750 mg Oral Q12H  . docusate sodium  100 mg Oral BID  . feeding supplement (ENSURE ENLIVE)  1 Bottle Oral TID BM  . folic acid  1 mg Oral Daily  . OLANZapine  5 mg Oral QHS  . pantoprazole  40 mg Oral Daily  . sodium chloride  3 mL Intravenous Q12H  . sodium chloride  1 g Oral BID WC   acetaminophen **OR** acetaminophen, butalbital-acetaminophen-caffeine, ondansetron **OR** ondansetron (ZOFRAN) IV  Assessment/ Plan:  54 y.o. male with hypertension, seizure disorder, chronic speech disorder, CKD stage II, anemia of CKD, SHPTH.  1.  Hyponatremia. 2.  CKD stage II 3.  Hypertension. 4.   Altered mental status.  Plan:  Serum sodium up to 140 today.  BUN rising at 36 and serum bicarbonate 31.  Therefore Lasix has been held which is appropriate.  Continue salt tabs at this point in time.  Continue to monitor renal function as well as serum sodium.  Patient continues to have significant tremors with ambulation.  He has been deemed unsafe for discharge to home.    LOS: [place], Lacrecia Delval 11/27/201610:32 AM

## 2015-11-13 NOTE — Progress Notes (Signed)
Problem: Safety: Goal: Ability to remain free from injury will improve Patient remains high fall risk bed alarm in place room near nurses station pt encourged to call with any needs  Problem: Fluid Volume: Goal: Ability to maintain a balanced intake and output will improve Outcome: Progressing Uneventful shift, pt resting comfortably, up to [facility] with one assit, requires feeding at meals r/t tremors, awating placement

## 2015-11-14 MED ORDER — PROPRANOLOL HCL 20 MG PO TABS
20.0000 mg | ORAL_TABLET | Freq: Two times a day (BID) | ORAL | Status: DC
  Administered 2015-11-14 – 2015-11-15 (×3): 20 mg via ORAL
  Filled 2015-11-14 (×5): qty 1

## 2015-11-14 NOTE — Plan of Care (Signed)
Problem: Safety: Goal: Ability to remain free from injury will improve Outcome: Progressing Pt remains free from falls.  Bed alarm on, call bell and phone within reach, bed in lowest position.  Pt calls with needs.

## 2015-11-14 NOTE — Progress Notes (Signed)
[facility]  ROUNDING NOTE   Subjective:   no new serum sodium at present. Patient appears to be at his baseline mental status. Continues to require assistance with feeds secondary to tremors. Still awaiting placement.  Objective:  Vital signs in last 24 hours:  Temp:  [98.1 F (36.7 C)-98.9 F (37.2 C)] 98.1 F (36.7 C) (11/28 0404) Pulse Rate:  [71-94] 71 (11/28 0404) Resp:  [19] 19 (11/28 0404) BP: (104-127)/(56-67) 104/56 mmHg (11/28 0404) SpO2:  [100 %] 100 % (11/28 0404) Weight:  [56.7 kg (125 lb)] 56.7 kg (125 lb) (11/28 0404)  Weight change: 0.454 kg (1 lb) Filed Weights   11/12/15 0508 11/13/15 0346 11/14/15 0404  Weight: 56.972 kg (125 lb 9.6 oz) 56.246 kg (124 lb) 56.7 kg (125 lb)    Intake/Output: I/O last 3 completed shifts: In: 1203 [P.O.:1200; I.V.:3] Out: -    Intake/Output this shift:  Total I/O In: 243 [P.O.:240; I.V.:3] Out: -   Physical Exam: General: Awake, alert, no acute distress.  Head: Laceration on forehead  Moist oral mucosal membranes  Eyes: Anicteric  Neck: Supple, trachea midline  Lungs:  Clear to auscultation normal effort  Heart: Regular rate and rhythm, no rubs  Abdomen:  Soft, nontender, BS present  Extremities:  trace peripheral edema.  Neurologic: Awake, alert, following commands  Skin: Forehead laceration healing       Basic Metabolic Panel:  Recent Labs Lab 11/09/15 0509 11/10/15 0936 11/12/15 0550 11/13/15 0554  NA 135 138 139 140  K 4.2  --  4.5  --   CL 101  --  103  --   CO2 30  --  31  --   GLUCOSE 84  --  89  --   BUN 35*  --  36*  --   CREATININE 1.01  --  1.13  --   CALCIUM 8.8*  --  9.4  --     Liver Function Tests: No results for input(s): AST, ALT, ALKPHOS, BILITOT, PROT, ALBUMIN in the last 168 hours. No results for input(s): LIPASE, AMYLASE in the last 168 hours. No results for input(s): AMMONIA in the last 168 hours.  CBC:  Recent Labs Lab 11/09/15 0509 11/09/15 1603  11/09/15 2138 11/10/15 0936  WBC 5.1  --   --   --   HGB 7.1* 7.2* 7.9* 8.4*  HCT 21.3* 21.6* 23.6*  --   MCV 101.4*  --   --   --   PLT 160  --   --   --     Cardiac Enzymes: No results for input(s): CKTOTAL, CKMB, CKMBINDEX, TROPONINI in the last 168 hours.  BNP: Invalid input(s): POCBNP  CBG: No results for input(s): GLUCAP in the last 168 hours.  Microbiology: Results for orders placed or performed in visit on 11/26/13  Urine culture     Status: None   Collection Time: 11/27/13  5:05 AM  Result Value Ref Range Status   Micro Text Report   Final       SOURCE: CLEAN CATCH    COMMENT                   MIXED BACTERIAL ORGANISMS   COMMENT                   RESULTS SUGGESTIVE OF CONTAMINATION   ANTIBIOTIC  Clostridium Difficile [facility])     Status: None   Collection Time: 11/29/13  9:51 AM  Result Value Ref Range Status   Micro Text Report   Final       C.DIFFICILE ANTIGEN       C.DIFFICILE GDH ANTIGEN : NEGATIVE   C.DIFFICILE TOXIN A/B     C.DIFFICILE TOXINS A AND B : NEGATIVE   INTERPRETATION            Negative for C. difficile.    ANTIBIOTIC                                                        Coagulation Studies: No results for input(s): LABPROT, INR in the last 72 hours.  Urinalysis: No results for input(s): COLORURINE, LABSPEC, PHURINE, GLUCOSEU, HGBUR, BILIRUBINUR, KETONESUR, PROTEINUR, UROBILINOGEN, NITRITE, LEUKOCYTESUR in the last 72 hours.  Invalid input(s): APPERANCEUR    Imaging: Dg Hand 2 View Left  11/12/2015  CLINICAL DATA:  Fall 10/30/2015, left thumb pain EXAM: LEFT HAND - 2 VIEW COMPARISON:  None. FINDINGS: Two views of the left hand submitted. No acute fracture is noted. Minimal subluxation of first metacarpal phalangeal joint mild be chronic in nature. There is narrowing of radiocarpal joint space. IMPRESSION: No acute fracture. Minimal subluxation of first carpometacarpal joint  probable chronic in nature. Narrowing of radiocarpal joint space. Electronically Signed   By: Lahoma Crocker M.D.   On: 11/12/2015 17:02     Medications:     . carbamazepine  200 mg Oral BID  . divalproex  750 mg Oral Q12H  . docusate sodium  100 mg Oral BID  . feeding supplement (ENSURE ENLIVE)  1 Bottle Oral TID BM  . folic acid  1 mg Oral Daily  . OLANZapine  5 mg Oral QHS  . pantoprazole  40 mg Oral Daily  . sodium chloride  3 mL Intravenous Q12H  . sodium chloride  1 g Oral BID WC   acetaminophen **OR** acetaminophen, butalbital-acetaminophen-caffeine, ondansetron **OR** ondansetron (ZOFRAN) IV  Assessment/ Plan:  54 y.o. male with hypertension, seizure disorder, chronic speech disorder, CKD stage II, anemia of CKD, SHPTH.  1.  Hyponatremia. 2.  CKD stage II 3.  Hypertension. 4.   Altered mental status.  Plan:  The patient's hyponatremia has significantly improved.  Most recent serum sodium was 140.  The patient has been taken off of Lasix.  He is continued on sodium chloride 1 g by mouth twice a day however.  He continues to have significant tremors while trying to feed himself.  Therefore he has required assistance with feeding.  He also has unsteady gait.  Therefore he has been deemed to be unsafe for home.  Hopefully physical therapy will work with him a bit further today.  We continue to await placement at this time.  LOS: 18 [name] 11/28/201612:53 PM

## 2015-11-14 NOTE — Progress Notes (Signed)
Physical Therapy Treatment Patient Details Name: [name] MRN: [id number] DOB: 07-10-1961 Today's Date: 11/14/2015    History of Present Illness Pt is a 54 y.o. male admitted 10/27/15 with abdominal pain, general weakness, and lightheadedness x5 days.  Pt s/p 1 unit PRBC's d/t anemia.  Pt s/p fall 10/30/15 with lacerations on head.  CT of head negative for fx and acute intracranial findings (positive for small midline frontal scalp contusion).  Pt also with multiple B lower rib fx's (present prior to current admission).    PT Comments    Pt continues to be very tremulous in general with any activity complicating pt's balance.  Pt also found with sherbet cup at bottom of bed by pt's feet (pt reports cup ended up there d/t his tremors and was having difficulty feeding himself; nursing notified that pt was asking for assist to eat his sherbet end of session).  Pt's HR increased from 110 bpm to 150 bpm with ambulation around nursing loop using RW (nursing notified and nursing notified MD).  Deferred further activity d/t poor HR response with activity today.  Pt does not appear safe to discharge home at this time d/t balance impairments and tremors complicating pt's ability to take care of himself independently.   Follow Up Recommendations  SNF     Equipment Recommendations  Rolling walker with 5" wheels    Recommendations for Other Services       Precautions / Restrictions Precautions Precautions: Fall Restrictions Weight Bearing Restrictions: No    Mobility  Bed Mobility Overal bed mobility: Needs Assistance Bed Mobility: Supine to Sit;Sit to Supine     Supine to sit: Supervision;HOB elevated Sit to supine: Supervision;HOB elevated   General bed mobility comments: SBA and vc's for safety required; pt requiring extra time and vc's for technique to scoot up in bed end of session  Transfers Overall transfer level: Needs assistance Equipment used: Rolling walker (2  wheeled) Transfers: Sit to/from Stand Sit to Stand: Min assist         General transfer comment: pt tremulous and unsteady; walker coming off floor occasionally; pt requiring vc's for safety with transfer  Ambulation/Gait Ambulation/Gait assistance: Min assist Ambulation Distance (Feet): 200 Feet Assistive device: Rolling walker (2 wheeled)       General Gait Details: step through pattern but gait pattern very inconsistent with step height/length/foot placement; walker coming off floor at times; impulsive at times; mild inconsistent gait speed; requiring assist to steady d/t occasional loss of balance   Stairs            Wheelchair Mobility    Modified Rankin (Stroke Patients Only)       Balance Overall balance assessment: Needs assistance Sitting-balance support: No upper extremity supported;Feet supported Sitting balance-Leahy Scale: Fair Sitting balance - Comments: complicated d/t tremors   Standing balance support: Bilateral upper extremity supported (on RW) Standing balance-Leahy Scale: Poor Standing balance comment: posterior loss of balance upon standing with RW                    Cognition Arousal/Alertness: Awake/alert Behavior During Therapy: Impulsive Overall Cognitive Status:  (Able to follow commands but impulsive)                      Exercises      General Comments   Nursing cleared pt for participation in physical therapy.  Pt agreeable to PT session.      Pertinent Vitals/Pain Pain Assessment: No/denies pain  Home Living                      Prior Function            PT Goals (current goals can now be found in the care plan section) Acute Rehab PT Goals Patient Stated Goal: to improve balance PT Goal Formulation: With patient Time For Goal Achievement: 11/21/15 Potential to Achieve Goals: Good Progress towards PT goals: Progressing toward goals    Frequency  Min 2X/week    PT Plan Current plan  remains appropriate    Co-evaluation             End of Session Equipment Utilized During Treatment: Gait belt Activity Tolerance: Patient tolerated treatment well Patient left: in bed;with call bell/phone within reach;with bed alarm set     Time: 1549-1600 PT Time Calculation (min) (ACUTE ONLY): 11 min  Charges:  $Therapeutic Activity: 8-22 mins                    G CodesLeitha Bleak 11/19/2015, 5:04 PM Leitha Bleak, [place]

## 2015-11-14 NOTE — Plan of Care (Signed)
Problem: Safety: Goal: Ability to remain free from injury will improve Outcome: Progressing Pt remained w/out injury during shift.  Pt near nurse's stn and he calls out for any needs. Hourly rounding provided.  Assistance provided to [facility] and w/eating b/c of tremors.  Pt is also working w/PT.  Considered by Psych and PT not safe to go home and care for himself.  Ward of state and awaiting placement.   Problem: Tissue Perfusion: Goal: Risk factors for ineffective tissue perfusion will decrease Outcome: Progressing VSS and good O2 sats on RA. Pt rec'd 2 u PRBC on 11/23.  Hgb improved to 8.4 on 11/24.  Hasn't been checked since. Fecal neg for occult blood.   Problem: Fluid Volume: Goal: Ability to maintain a balanced intake and output will improve Outcome: Progressing Pt on fluid restriction of 1200 ml.  Very good appetite - finishes 100% of fluids and food on tray. Pt's Na level is WNL and continues on Na tabs 1gm.

## 2015-11-14 NOTE — Progress Notes (Signed)
[place] at [place] NAME: [name]    MR#:  [id number]  DATE OF BIRTH:  [birth year]   CHIEF COMPLAINT: Weakness, more shaking today.  SUBJECTIVE:  Awake and alert. . Was found to be hypotensive and anemic to 7.1 on 11/09/2015 transfused with improvement of hemoglobin to 8.4 on 24th of November. Feels comfortable. Denies any pain. States that he eats plenty and feels that he is getting stronger. He was felt to be incompetent by Dr. Weber Cooks and skilled nursing facility as well as guardianship was looked at by care management.  Denies any complains, want to go home.   Said, the walker's height need to be adjusted, waiting for PT.   REVIEW OF SYSTEMS:  Review of systems  CONSTITUTIONAL: No fever, has weakness.  EYES: No blurred or double vision.  EARS, NOSE, AND THROAT: No tinnitus or ear pain.  RESPIRATORY: No SOB/Cough CARDIOVASCULAR: No chest pain, orthopnea, edema.  GASTROINTESTINAL: No nausea, vomiting, diarrhea or abdominal pain.  GENITOURINARY: No dysuria, hematuria.  ENDOCRINE: No polyuria, nocturia,  HEMATOLOGY: No anemia, easy bruising or bleeding SKIN: No rash or lesion. MUSCULOSKELETAL: No joint pain or arthritis.  NEUROLOGIC: No tingling, numbness, weakness.  PSYCHIATRY: No anxiety or depression.       DRUG ALLERGIES:  No Known Allergies  VITALS:  Blood pressure 130/72, pulse 150, temperature 98.5 F (36.9 C), temperature source Oral, resp. rate 19, height 5\' 3"  (1.6 m), weight 56.7 kg (125 lb), SpO2 96 %.  PHYSICAL EXAMINATION:  GENERAL:  54 y.o.-year-old patient lying in the bed with no acute distress.  EYES: Pupils equal, round, reactive to light and accommodation. No scleral icterus.  HEENT: dressings on head- after laceration , normocephalic. NECK:  Supple, no jugular venous distention. No thyroid enlargement, no tenderness.  LUNGS: Normal breath sounds bilaterally, no wheezing, rales,rhonchi  or crepitation. No use of accessory muscles of respiration.  CARDIOVASCULAR: S1, S2 normal. No murmurs, rubs, or gallops.  ABDOMEN: Soft, nontender, nondistended. Bowel sounds present. No organomegaly or mass.  EXTREMITIES: No pedal edema, cyanosis, or clubbing.  NEUROLOGIC: have coarse shaking movements, and moves all limbs.  PSYCHIATRIC: The patient is awake and alert SKIN: No obvious rash, lesion, or ulcer.    LABORATORY PANEL:   CBC  Recent Labs Lab 11/09/15 0509  11/09/15 2138 11/10/15 0936  WBC 5.1  --   --   --   HGB 7.1*  < > 7.9* 8.4*  HCT 21.3*  < > 23.6*  --   PLT 160  --   --   --   < > = values in this interval not displayed. ------------------------------------------------------------------------------------------------------------------  Chemistries   Recent Labs Lab 11/12/15 0550 11/13/15 0554  NA 139 140  K 4.5  --   CL 103  --   CO2 31  --   GLUCOSE 89  --   BUN 36*  --   CREATININE 1.13  --   CALCIUM 9.4  --    ------------------------------------------------------------------------------------------------------------------  Cardiac Enzymes No results for input(s): TROPONINI in the last 168 hours. ------------------------------------------------------------------------------------------------------------------  RADIOLOGY:  No results found.   ASSESSMENT AND PLAN:   # Hyponatremia due to SIADH -off  Lasix , on salt tablets, appreciate nephrologist input, sodium level stable. Patient is on Depakote and Tegretol which could cause SIADH and hyponatremia Was on samsca, which was discontinued. On 1200 ml fluid restriction.   #Near syncope secondary to Symptomatic anemia with chronic abdominal pain Transfused 2  units of blood and repeated hemoglobin at 8.4 (on 11/10/15) , up from 7.1, no obvious bleeding, but  the patient was hypotensive.   Stool for occult blood was negative in the ED, repeated again Negative.  Continue Protonix oral.  CT  chest, abdomen and pelvis are negative except for lower rib fractures.. EGD/Colonoscopy as OP with Dr. Allen Norris  is being planned.  # Hypotension, status post 2 units of blood cell  Transfusion, blood pressure has improved . Follow patient's blood pressure readings closely, patient is off Lasix, off Norvasc  and ACE inhibitor   Resumed propranolol on 11/14/15 due to shaking.  # B12 and Folate deficiency. Replacement started  # Failure to thrive - unintentional weight loss of 30 pounds in 3 months. CT head is negative for any metastases. CT chest, abdomen and pelvis -no malignancy  Patient is scheduled for EGD and colonoscopy as OP. Now having very good oral intake and looks more strong.  # Metabolic encephalopathy Ct ehad negative  Normal tegretol and depakot level.The patient's mental status remains stable. He has no capacity to make decisions at this point per Dr. Weber Cooks.No family member or POA.  # Partially healed lower rib fractures.  #. Essential tremors- Resume ( 11/14/15)  home propranolol, unless blood pressure readings are low .   #. History of seizures On Depakote and Tegretol. . Patient is not safe to be discharged home, will be discharged to skilled nursing facility as soon as bed is available, guardianship is pursued by social workers     SW working on placement at [facility], the patient is ambulating with physical therapy and feels that he is overall getting stronger. He wants to go home, but that has continuous tremors, which makes him unsafe to return back home.   All the records are reviewed and case discussed with Care Management/Social Workerr.  CODE STATUS: Full code  TOTAL TIME TAKING CARE OF THIS PATIENT: 30 minutes.     Vaughan Basta M.D on 11/14/2015 at 8:53 PM  Between 7am to 6pm - Pager - [phone] After 6pm go to [email or web address] - password [email or web address] [place] [facility]  Office  [phone]  CC: Primary care physician; [name], MD

## 2015-11-15 LAB — BASIC METABOLIC PANEL
Anion gap: 6 (ref 5–15)
BUN: 44 mg/dL — AB (ref 6–20)
CALCIUM: 9.5 mg/dL (ref 8.9–10.3)
CO2: 30 mmol/L (ref 22–32)
CREATININE: 1.14 mg/dL (ref 0.61–1.24)
Chloride: 106 mmol/L (ref 101–111)
GFR calc Af Amer: >60 mL/min (ref >60)
GLUCOSE: 107 mg/dL — AB (ref 65–99)
Potassium: 4.1 mmol/L (ref 3.5–5.1)
Sodium: 142 mmol/L (ref 135–145)

## 2015-11-15 NOTE — Progress Notes (Signed)
[facility]  ROUNDING NOTE   Subjective:  No new serum sodium this a.m. Patient remains off of Lasix. Restarted on propranolol.   Objective:  Vital signs in last 24 hours:  Temp:  [98.2 F (36.8 C)-98.5 F (36.9 C)] 98.2 F (36.8 C) (11/29 0502) Pulse Rate:  [70-150] 70 (11/29 0502) Resp:  [18-19] 18 (11/29 0502) BP: (113-136)/(71-72) 113/71 mmHg (11/29 0502) SpO2:  [96 %-100 %] 100 % (11/29 0502) Weight:  [55.157 kg (121 lb 9.6 oz)] 55.157 kg (121 lb 9.6 oz) (11/29 0500)  Weight change: -1.542 kg (-3 lb 6.4 oz) Filed Weights   11/13/15 0346 11/14/15 0404 11/15/15 0500  Weight: 56.246 kg (124 lb) 56.7 kg (125 lb) 55.157 kg (121 lb 9.6 oz)    Intake/Output: I/O last 3 completed shifts: In: 496 [P.O.:493; I.V.:3] Out: 450 [Urine:450]   Intake/Output this shift:     Physical Exam: General: Awake, alert, no acute distress.  Head: Laceration on forehead  Moist oral mucosal membranes  Eyes: Anicteric  Neck: Supple, trachea midline  Lungs:  Clear to auscultation normal effort  Heart: Regular rate and rhythm, no rubs  Abdomen:  Soft, nontender, BS present  Extremities:  trace peripheral edema.  Neurologic: Awake, alert, following commands, tremors noted in hands  Skin: Forehead laceration healing       Basic Metabolic Panel:  Recent Labs Lab 11/09/15 0509 11/10/15 0936 11/12/15 0550 11/13/15 0554  NA 135 138 139 140  K 4.2  --  4.5  --   CL 101  --  103  --   CO2 30  --  31  --   GLUCOSE 84  --  89  --   BUN 35*  --  36*  --   CREATININE 1.01  --  1.13  --   CALCIUM 8.8*  --  9.4  --     Liver Function Tests: No results for input(s): AST, ALT, ALKPHOS, BILITOT, PROT, ALBUMIN in the last 168 hours. No results for input(s): LIPASE, AMYLASE in the last 168 hours. No results for input(s): AMMONIA in the last 168 hours.  CBC:  Recent Labs Lab 11/09/15 0509 11/09/15 1603 11/09/15 2138 11/10/15 0936  WBC 5.1  --   --   --   HGB 7.1* 7.2*  7.9* 8.4*  HCT 21.3* 21.6* 23.6*  --   MCV 101.4*  --   --   --   PLT 160  --   --   --     Cardiac Enzymes: No results for input(s): CKTOTAL, CKMB, CKMBINDEX, TROPONINI in the last 168 hours.  BNP: Invalid input(s): POCBNP  CBG: No results for input(s): GLUCAP in the last 168 hours.  Microbiology: Results for orders placed or performed in visit on 11/26/13  Urine culture     Status: None   Collection Time: 11/27/13  5:05 AM  Result Value Ref Range Status   Micro Text Report   Final       SOURCE: CLEAN CATCH    COMMENT                   MIXED BACTERIAL ORGANISMS   COMMENT                   RESULTS SUGGESTIVE OF CONTAMINATION   ANTIBIOTIC  Clostridium Difficile [facility])     Status: None   Collection Time: 11/29/13  9:51 AM  Result Value Ref Range Status   Micro Text Report   Final       C.DIFFICILE ANTIGEN       C.DIFFICILE GDH ANTIGEN : NEGATIVE   C.DIFFICILE TOXIN A/B     C.DIFFICILE TOXINS A AND B : NEGATIVE   INTERPRETATION            Negative for C. difficile.    ANTIBIOTIC                                                        Coagulation Studies: No results for input(s): LABPROT, INR in the last 72 hours.  Urinalysis: No results for input(s): COLORURINE, LABSPEC, PHURINE, GLUCOSEU, HGBUR, BILIRUBINUR, KETONESUR, PROTEINUR, UROBILINOGEN, NITRITE, LEUKOCYTESUR in the last 72 hours.  Invalid input(s): APPERANCEUR    Imaging: No results found.   Medications:     . carbamazepine  200 mg Oral BID  . divalproex  750 mg Oral Q12H  . docusate sodium  100 mg Oral BID  . feeding supplement (ENSURE ENLIVE)  1 Bottle Oral TID BM  . folic acid  1 mg Oral Daily  . OLANZapine  5 mg Oral QHS  . pantoprazole  40 mg Oral Daily  . propranolol  20 mg Oral BID  . sodium chloride  3 mL Intravenous Q12H  . sodium chloride  1 g Oral BID WC   acetaminophen **OR** acetaminophen, butalbital-acetaminophen-caffeine,  ondansetron **OR** ondansetron (ZOFRAN) IV  Assessment/ Plan:  54 y.o. male with hypertension, seizure disorder, chronic speech disorder, CKD stage II, anemia of CKD, SHPTH.  1.  Hyponatremia. 2.  CKD stage II 3.  Hypertension. 4.   Altered mental status.  Plan:  Avoiding new serum sodium today as well as repeat renal function testing. Lasix has been stopped. However patient remains on sodium chloride 1 g by mouth twice a day. He continues to have significant tremors which is complicating his balance. It is still felt that he is not safe for home. He has been restarted on propranolol for his underlying tremors. As before he appears to be at his baseline mental status now.    LOS: 19 [name] 11/29/20167:11 AM

## 2015-11-15 NOTE — Clinical Social Work Note (Signed)
Clinical Social Worker called [place] DSS to address potential guardianship. CSW is awaiting a return phone call for DSS worker to start the inquiry if pt meets criteria for guardianship.   Per RNCM, the financial counselor met with pt and pt has $10,000 in cash. The financial counselor will still submit the application for Medicaid.   CSW will continue to follow.   Darden Dates, MSW, LCSW Clinical Social Worker  [phone]

## 2015-11-15 NOTE — Progress Notes (Signed)
[place] at [place] NAME: [name]    MR#:  [id number]  DATE OF BIRTH:  1961-09-08    SUBJECTIVE:  No acute events night     REVIEW OF SYSTEMS:  Review of systems  CONSTITUTIONAL: No fever, has weakness.  EYES: No blurred or double vision.  EARS, NOSE, AND THROAT: No tinnitus or ear pain.  RESPIRATORY: No SOB/Cough CARDIOVASCULAR: No chest pain, orthopnea, edema.  GASTROINTESTINAL: No nausea, vomiting, diarrhea or abdominal pain.  GENITOURINARY: No dysuria, hematuria.  ENDOCRINE: No polyuria, nocturia,  HEMATOLOGY: No anemia, easy bruising or bleeding SKIN: No rash or lesion. MUSCULOSKELETAL: No joint pain or arthritis.  NEUROLOGIC: No tingling, numbness, weakness.  PSYCHIATRY: No anxiety or depression.       DRUG ALLERGIES:  No Known Allergies  VITALS:  Blood pressure 113/71, pulse 70, temperature 98.2 F (36.8 C), temperature source Oral, resp. rate 18, height 5\' 3"  (1.6 m), weight 55.157 kg (121 lb 9.6 oz), SpO2 100 %.  PHYSICAL EXAMINATION:  GENERAL:  54 y.o.-year-old patient lying in the bed with no acute distress.  EYES: Pupils equal, round, reactive to light and accommodation. No scleral icterus.  HEENT: dressings on head- after laceration , normocephalic. NECK:  Supple, no jugular venous distention. No thyroid enlargement, no tenderness.  LUNGS: Normal breath sounds bilaterally, no wheezing, rales,rhonchi or crepitation. No use of accessory muscles of respiration.  CARDIOVASCULAR: S1, S2 normal. No murmurs, rubs, or gallops.  ABDOMEN: Soft, nontender, nondistended. Bowel sounds present. No organomegaly or mass.  EXTREMITIES: No pedal edema, cyanosis, or clubbing.  NEUROLOGIC: no acute focal defecits.  PSYCHIATRIC: The patient is awake and alert SKIN: No obvious rash, lesion, or ulcer.    LABORATORY PANEL:   CBC  Recent Labs Lab 11/09/15 0509  11/09/15 2138 11/10/15 0936  WBC 5.1  --    --   --   HGB 7.1*  < > 7.9* 8.4*  HCT 21.3*  < > 23.6*  --   PLT 160  --   --   --   < > = values in this interval not displayed. ------------------------------------------------------------------------------------------------------------------  Chemistries   Recent Labs Lab 11/15/15 0814  NA 142  K 4.1  CL 106  CO2 30  GLUCOSE 107*  BUN 44*  CREATININE 1.14  CALCIUM 9.5   ------------------------------------------------------------------------------------------------------------------  Cardiac Enzymes No results for input(s): TROPONINI in the last 168 hours. ------------------------------------------------------------------------------------------------------------------  RADIOLOGY:  No results found.   ASSESSMENT AND PLAN:   # Hyponatremia due to SIADH -off  Lasix , on salt tablets, appreciate nephrologist input, sodium level stable. Patient is on Depakote and Tegretol which could cause SIADH and hyponatremia Was on samsca, which was discontinued. On 1200 ml fluid restriction.   #Near syncope secondary to Symptomatic anemia with chronic abdominal pain Transfused 2 units of blood and repeated hemoglobin at 8.4 (on 11/10/15) , up from 7.1, no obvious bleeding, but  the patient was hypotensive.   Stool for occult blood was negative in the ED, repeated again Negative.  Continue Protonix oral.  CT chest, abdomen and pelvis are negative except for lower rib fractures.. EGD/Colonoscopy as OP with Dr. Allen Norris  is being planned.  # Hypotension, status post 2 units of blood cell  Transfusion, blood pressure has improved . Follow patient's blood pressure readings closely, patient is off Lasix, off Norvasc  and ACE inhibitor   Resumed propranolol on 11/14/15 due to shaking.  # B12 and Folate deficiency. Replacement  started  # Failure to thrive - unintentional weight loss of 30 pounds in 3 months. CT head is negative for any metastases. CT chest, abdomen and pelvis -no  malignancy  Patient is scheduled for EGD and colonoscopy as OP.  # Metabolic encephalopathy Ct head negative  . He has no capacity to make decisions at this point per Dr. Weber Cooks.No family member or POA.  # Partially healed lower rib fractures.  #. Essential tremors- Resume ( 11/14/15)  home propranolol, unless blood pressure readings are low .   #. History of seizures On Depakote and Tegretol. . Patient is not safe to be discharged home, will be discharged to skilled nursing facility as soon as bed is available, guardianship is pursued by social workers     SW working on placement at [employer],    CODE STATUS: Full code  [place]: 20 minutes.     Ramona Ruark M.D on 11/15/2015 at 11:26 AM  Between 7am to 6pm - Pager - [phone] After 6pm go to [email or web address] - password [email or web address] [facility]  Office  [phone]  CC: Primary care physician; [name], MD

## 2015-11-15 NOTE — Care Management (Signed)
Financial Counselor, Aleene Davidson, interviewed [name] for a medicaid application. [name] informed the counselor that he had $ 10,000.00 in reserve.  [name] indicated to me that if [name] truly has $10,000.00 in reserve, he would not be eligible for medicaid. States she will submit this application. Informed Counselor that possibly the Department of Social Services will be seeking guardship for [name].  Shelbie Ammons RN MSN CCM Care Management [phone]

## 2015-11-15 NOTE — Plan of Care (Signed)
Problem: Safety: Goal: Ability to remain free from injury will improve Outcome: Progressing Pt remains free from falls.  Bed in lowest position, call bell and phone within reach.  Pt calls frequently with needs, but is not impulsive.

## 2015-11-16 LAB — BASIC METABOLIC PANEL
ANION GAP: 5 (ref 5–15)
BUN: 48 mg/dL — AB (ref 6–20)
CO2: 30 mmol/L (ref 22–32)
Calcium: 9.6 mg/dL (ref 8.9–10.3)
Chloride: 109 mmol/L (ref 101–111)
Creatinine, Ser: 1.1 mg/dL (ref 0.61–1.24)
GFR calc Af Amer: >60 mL/min (ref >60)
GFR calc non Af Amer: >60 mL/min (ref >60)
GLUCOSE: 85 mg/dL (ref 65–99)
POTASSIUM: 4.6 mmol/L (ref 3.5–5.1)
Sodium: 144 mmol/L (ref 135–145)

## 2015-11-16 MED ORDER — PROPRANOLOL HCL 40 MG PO TABS
40.0000 mg | ORAL_TABLET | Freq: Two times a day (BID) | ORAL | Status: DC
  Administered 2015-11-16 – 2015-11-18 (×5): 40 mg via ORAL
  Filled 2015-11-16: qty 2
  Filled 2015-11-16 (×5): qty 1

## 2015-11-16 NOTE — Progress Notes (Signed)
Physical Therapy Treatment Patient Details Name: [name] MRN: [id number] DOB: Mar 11, 1961 Today's Date: 11/16/2015    History of Present Illness Pt is a 54 y.o. male admitted 10/27/15 with abdominal pain, general weakness, and lightheadedness x5 days.  Pt s/p 1 unit PRBC's d/t anemia.  Pt s/p fall 10/30/15 with lacerations on head.  CT of head negative for fx and acute intracranial findings (positive for small midline frontal scalp contusion).  Pt also with multiple B lower rib fx's (present prior to current admission).    PT Comments    Pt demonstrating improved balance with transfers and ambulation; however, displays poor safety awareness and impulsivity with functional mobility. Pt improves distance for ambulation without LOB and ascends/descends 14 steps with 2 handrails with poor safety particularly ascending with inconsistent foot placement often only placing toes on step. Pt educated, but inconsistent at best. Preferred technique descending with step to pattern and proper foot placement. Pt also demonstrates decreased safety with chair approach requiring cues for proper technique. Overall, pt has improved balance and ambulation distance, but has safety concerns that need addressed. Feel pt would be safest in a 24 hour supervision environment. Continue PT for to improve safety with all functional mobility.   Follow Up Recommendations  Home health PT;Supervision/Assistance - 24 hour (due to impulsivity and unsafe nature)     Equipment Recommendations       Recommendations for Other Services       Precautions / Restrictions Restrictions Weight Bearing Restrictions: No    Mobility  Bed Mobility Overal bed mobility: Modified Independent             General bed mobility comments: Sits independently without use of rails  Transfers Overall transfer level: Needs assistance Equipment used: Rolling walker (2 wheeled) Transfers: Sit to/from Stand Sit to Stand: Min guard (cues  for safe hand placement; noncompliant)         General transfer comment: Tremors near baseline according to pt; pt manages well,but encouraged to use safe techniques with sit to/from stand.   Ambulation/Gait Ambulation/Gait assistance: Min guard Ambulation Distance (Feet): 310 Feet Assistive device: Rolling walker (2 wheeled) Gait Pattern/deviations:  (nearly at baseline according to patient)   Gait velocity interpretation: at or above normal speed for age/gender General Gait Details: Step through with narrow base of support. Tremors present, but not adversely affecting balance. No LOB. Unsafe chair approach requiring cues to turn all the way around to chair and reach back to sit.    Stairs Stairs: Yes Stairs assistance: Min guard Stair Management: Two rails Number of Stairs: 14 General stair comments: Reciprocal ascending with inconsistent foot placement on step; encouraged to slow pace and place entire foot on step each time. Step to descending with good control/pace  Wheelchair Mobility    Modified Rankin (Stroke Patients Only)       Balance Overall balance assessment: Needs assistance         Standing balance support: Bilateral upper extremity supported Standing balance-Leahy Scale: Fair                      Cognition Arousal/Alertness: Awake/alert Behavior During Therapy: Impulsive Overall Cognitive Status: Within Functional Limits for tasks assessed                      Exercises      General Comments        Pertinent Vitals/Pain Pain Assessment: No/denies pain    Home Living  Prior Function            PT Goals (current goals can now be found in the care plan section) Progress towards PT goals: Progressing toward goals    Frequency  Min 2X/week    PT Plan Discharge plan needs to be updated    Co-evaluation             End of Session Equipment Utilized During Treatment: Gait  belt Activity Tolerance: Patient tolerated treatment well Patient left: in chair;with call bell/phone within reach;with chair alarm set     Time: [id number] PT Time Calculation (min) (ACUTE ONLY): 24 min  Charges:  $Gait Training: 23-37 mins                    G Codes:      Charlaine Dalton 11/16/2015, 2:52 PM

## 2015-11-16 NOTE — Plan of Care (Signed)
Problem: Fluid Volume: Goal: Ability to maintain a balanced intake and output will improve Outcome: Progressing Plan of care, progress to goals. 1. Pt remained safe and free of injury this shift. 2. VSS. No pain. 3. Anticipate placement in SNF. Dorna Bloom RN

## 2015-11-16 NOTE — Plan of Care (Addendum)
No c/o pain, worked with PT today, placement pending  Problem: Safety: Goal: Ability to remain free from injury will improve Outcome: Progressing Pt is high falls, call bell and phone within reach, bed alarm in use, hourly safety rounds, no impulsive behavior this shift   Problem: Tissue Perfusion: Goal: Risk factors for ineffective tissue perfusion will decrease Outcome: Progressing VSS, afebrile, pt has panty hose from home on per his request

## 2015-11-16 NOTE — Progress Notes (Signed)
[facility]  ROUNDING NOTE   Subjective:  Serum sodium currently 144. Has done well off of Lasix. Continues to maintain normal blood pressure at this time off antihypertensives.  Objective:  Vital signs in last 24 hours:  Temp:  [98.2 F (36.8 C)-98.5 F (36.9 C)] 98.2 F (36.8 C) (11/30 0508) Pulse Rate:  [73-81] 73 (11/30 0508) Resp:  [18-20] 18 (11/30 0508) BP: (111-124)/(66-73) 111/73 mmHg (11/30 0508) SpO2:  [100 %] 100 % (11/30 0508) Weight:  [56.745 kg (125 lb 1.6 oz)] 56.745 kg (125 lb 1.6 oz) (11/30 0500)  Weight change: 1.588 kg (3 lb 8 oz) Filed Weights   11/14/15 0404 11/15/15 0500 11/16/15 0500  Weight: 56.7 kg (125 lb) 55.157 kg (121 lb 9.6 oz) 56.745 kg (125 lb 1.6 oz)    Intake/Output: I/O last 3 completed shifts: In: 963 [P.O.:960; I.V.:3] Out: 1650 [Urine:1650]   Intake/Output this shift:  Total I/O In: -  Out: 200 [Urine:200]  Physical Exam: General: Awake, alert, no acute distress.  Head: Laceration on forehead  Moist oral mucosal membranes  Eyes: Anicteric  Neck: Supple, trachea midline  Lungs:  Clear to auscultation normal effort  Heart: Regular rate and rhythm, no rubs  Abdomen:  Soft, nontender, BS present  Extremities:  trace peripheral edema.  Neurologic: Awake, alert, following commands, tremors noted in hands  Skin: Forehead laceration healing       Basic Metabolic Panel:  Recent Labs Lab 11/10/15 0936 11/12/15 0550 11/13/15 0554 11/15/15 0814 11/16/15 0459  NA 138 139 140 142 144  K  --  4.5  --  4.1 4.6  CL  --  103  --  106 109  CO2  --  31  --  30 30  GLUCOSE  --  89  --  107* 85  BUN  --  36*  --  44* 48*  CREATININE  --  1.13  --  1.14 1.10  CALCIUM  --  9.4  --  9.5 9.6    Liver Function Tests: No results for input(s): AST, ALT, ALKPHOS, BILITOT, PROT, ALBUMIN in the last 168 hours. No results for input(s): LIPASE, AMYLASE in the last 168 hours. No results for input(s): AMMONIA in the last 168  hours.  CBC:  Recent Labs Lab 11/09/15 1603 11/09/15 2138 11/10/15 0936  HGB 7.2* 7.9* 8.4*  HCT 21.6* 23.6*  --     Cardiac Enzymes: No results for input(s): CKTOTAL, CKMB, CKMBINDEX, TROPONINI in the last 168 hours.  BNP: Invalid input(s): POCBNP  CBG: No results for input(s): GLUCAP in the last 168 hours.  Microbiology: Results for orders placed or performed in visit on 11/26/13  Urine culture     Status: None   Collection Time: 11/27/13  5:05 AM  Result Value Ref Range Status   Micro Text Report   Final       SOURCE: CLEAN CATCH    COMMENT                   MIXED BACTERIAL ORGANISMS   COMMENT                   RESULTS SUGGESTIVE OF CONTAMINATION   ANTIBIOTIC  Clostridium Difficile [facility])     Status: None   Collection Time: 11/29/13  9:51 AM  Result Value Ref Range Status   Micro Text Report   Final       C.DIFFICILE ANTIGEN       C.DIFFICILE GDH ANTIGEN : NEGATIVE   C.DIFFICILE TOXIN A/B     C.DIFFICILE TOXINS A AND B : NEGATIVE   INTERPRETATION            Negative for C. difficile.    ANTIBIOTIC                                                        Coagulation Studies: No results for input(s): LABPROT, INR in the last 72 hours.  Urinalysis: No results for input(s): COLORURINE, LABSPEC, PHURINE, GLUCOSEU, HGBUR, BILIRUBINUR, KETONESUR, PROTEINUR, UROBILINOGEN, NITRITE, LEUKOCYTESUR in the last 72 hours.  Invalid input(s): APPERANCEUR    Imaging: No results found.   Medications:     . carbamazepine  200 mg Oral BID  . divalproex  750 mg Oral Q12H  . docusate sodium  100 mg Oral BID  . feeding supplement (ENSURE ENLIVE)  1 Bottle Oral TID BM  . folic acid  1 mg Oral Daily  . OLANZapine  5 mg Oral QHS  . pantoprazole  40 mg Oral Daily  . propranolol  40 mg Oral BID  . sodium chloride  3 mL Intravenous Q12H  . sodium chloride  1 g Oral BID WC   acetaminophen **OR** acetaminophen,  butalbital-acetaminophen-caffeine, ondansetron **OR** ondansetron (ZOFRAN) IV  Assessment/ Plan:  54 y.o. male with hypertension, seizure disorder, chronic speech disorder, CKD stage II, anemia of CKD, SHPTH.  1.  Hyponatremia. 2.  CKD stage II 3.  Hypertension. 4.   Altered mental status.  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 He has done well off Lasix. We will attempt to discontinue sodium chloride and follow serum sodium. His renal function has also remained stable and he has remained normotensive off antihypertensives. He remains on propranolol for his tremors. He is being monitored by case management closely. He will end up requiring guardianship and placement.    LOS: [place], Irving Bloor 11/30/201610:06 AM

## 2015-11-16 NOTE — Clinical Social Work Note (Signed)
Clinical Education officer, museum spoke with [facility]. They are pursuing guardianship for pt and will be following up on Medicaid application. Pt has a history with [facility]. CSW will follow up with psychiatrist regarding letter stating pt is incompetent. CSW will continue to follow.   Darden Dates, MSW, LCSW Clinical Social Worker  [phone]

## 2015-11-16 NOTE — Progress Notes (Signed)
[place] at [place] NAME: Emric Nicolo    MR#:  [id number]  DATE OF BIRTH:  September 03, 1961    SUBJECTIVE:  No acute events night No new complains.   REVIEW OF SYSTEMS:  Review of systems  CONSTITUTIONAL: No fever, has weakness.  EYES: No blurred or double vision.  EARS, NOSE, AND THROAT: No tinnitus or ear pain.  RESPIRATORY: No SOB/Cough CARDIOVASCULAR: No chest pain, orthopnea, edema.  GASTROINTESTINAL: No nausea, vomiting, diarrhea or abdominal pain.  GENITOURINARY: No dysuria, hematuria.  ENDOCRINE: No polyuria, nocturia,  HEMATOLOGY: No anemia, easy bruising or bleeding SKIN: No rash or lesion. MUSCULOSKELETAL: No joint pain or arthritis.  NEUROLOGIC: No tingling, numbness, weakness.  PSYCHIATRY: No anxiety or depression.       DRUG ALLERGIES:  No Known Allergies  VITALS:  Blood pressure 118/64, pulse 80, temperature 98 F (36.7 C), temperature source Oral, resp. rate 19, height 5\' 3"  (1.6 m), weight 56.745 kg (125 lb 1.6 oz), SpO2 99 %.  PHYSICAL EXAMINATION:  GENERAL:  54 y.o.-year-old patient lying in the bed with no acute distress.  EYES: Pupils equal, round, reactive to light and accommodation. No scleral icterus.  HEENT: atraumatic , normocephalic.oropharynx and nasopharynx clear. NECK:  Supple, no jugular venous distention. No thyroid enlargement, no tenderness.  LUNGS: Normal breath sounds bilaterally, no wheezing, rales,rhonchi or crepitation. No use of accessory muscles of respiration.  CARDIOVASCULAR: S1, S2 normal. No murmurs, rubs, or gallops.  ABDOMEN: Soft, nontender, nondistended. Bowel sounds present. No organomegaly or mass.  EXTREMITIES: No pedal edema, cyanosis, or clubbing.  NEUROLOGIC: no acute focal defecits. Coarse shaking. PSYCHIATRIC: The patient is awake and alert SKIN: No obvious rash, lesion, or ulcer.    LABORATORY PANEL:   CBC  Recent Labs Lab 11/09/15 2138  11/10/15 0936  HGB 7.9* 8.4*  HCT 23.6*  --    ------------------------------------------------------------------------------------------------------------------  Chemistries   Recent Labs Lab 11/16/15 0459  NA 144  K 4.6  CL 109  CO2 30  GLUCOSE 85  BUN 48*  CREATININE 1.10  CALCIUM 9.6   ------------------------------------------------------------------------------------------------------------------  Cardiac Enzymes No results for input(s): TROPONINI in the last 168 hours. ------------------------------------------------------------------------------------------------------------------  RADIOLOGY:  No results found.   ASSESSMENT AND PLAN:   # Hyponatremia due to SIADH -off  Lasix , on salt tablets, appreciate nephrologist input, sodium level stable now. Patient is on Depakote and Tegretol which could cause SIADH and hyponatremia Was on samsca, which was discontinued. On 1200 ml fluid restriction.   #Near syncope secondary to Symptomatic anemia with chronic abdominal pain Transfused 2 units of blood and repeated hemoglobin at 8.4 (on 11/10/15) , up from 7.1, no obvious bleeding, but  the patient was hypotensive.   Stool for occult blood was negative in the ED, repeated again Negative.  Continue Protonix oral.  CT chest, abdomen and pelvis are negative except for lower rib fractures.. EGD/Colonoscopy as OP with Dr. Allen Norris  is being planned.  # Hypotension, status post 2 units of blood cell  Transfusion, blood pressure has improved . Follow patient's blood pressure readings closely, patient is off Lasix, off Norvasc  and ACE inhibitor   Resumed propranolol on 11/14/15 due to shaking.   Increased dose. BP stable.  # B12 and Folate deficiency. Replacement started  # Failure to thrive - unintentional weight loss of 30 pounds in 3 months. CT head is negative for any metastases. CT chest, abdomen and pelvis -no malignancy  Patient is scheduled for  EGD and colonoscopy  as OP.  # Metabolic encephalopathy Ct head negative- improved now.  . He has no capacity to make decisions at this point per Dr. Weber Cooks.No family member or POA. Social worker working with DSS to get guardianship.  # Partially healed lower rib fractures.  #. Essential tremors- Resume ( 11/14/15)  home propranolol, unless blood pressure readings are low .   #. History of seizures On Depakote and Tegretol. . Patient is not safe to be discharged home, will be discharged to skilled nursing facility as soon as bed is available, guardianship is pursued by social workers     SW working on placement at [employer],  CODE STATUS: Full code  [place]: 20 minutes.   Vaughan Basta M.D on 11/16/2015 at 9:27 PM  Between 7am to 6pm - Pager - [phone]  After 6pm go to [email or web address] - password [email or web address] [facility]  Office  [phone]  CC: Primary care physician; [name], MD

## 2015-11-17 DIAGNOSIS — R4182 Altered mental status, unspecified: Secondary | ICD-10-CM | POA: Insufficient documentation

## 2015-11-17 NOTE — Consult Note (Signed)
  Psychiatry: Full note to follow, but after reevaluation today and conversation with the attending physicians I would state that the patient does appear to have capacity to make medical decisions for himself at this time.

## 2015-11-17 NOTE — Progress Notes (Signed)
[place] at [place] NAME: [name]    MR#:  [id number]  DATE OF BIRTH:  01-25-1961    SUBJECTIVE:  No acute events night No new complains. He is upset about the fact of getting somebody is his guardian, and said that he is completely able to make his decisions.   REVIEW OF SYSTEMS:  Review of systems  CONSTITUTIONAL: No fever, has weakness.  EYES: No blurred or double vision.  EARS, NOSE, AND THROAT: No tinnitus or ear pain.  RESPIRATORY: No SOB/Cough CARDIOVASCULAR: No chest pain, orthopnea, edema.  GASTROINTESTINAL: No nausea, vomiting, diarrhea or abdominal pain.  GENITOURINARY: No dysuria, hematuria.  ENDOCRINE: No polyuria, nocturia,  HEMATOLOGY: No anemia, easy bruising or bleeding SKIN: No rash or lesion. MUSCULOSKELETAL: No joint pain or arthritis.  NEUROLOGIC: No tingling, numbness, weakness.  PSYCHIATRY: No anxiety or depression.       DRUG ALLERGIES:  No Known Allergies  VITALS:  Blood pressure 106/62, pulse 76, temperature 98.1 F (36.7 C), temperature source Oral, resp. rate 20, height 5\' 3"  (1.6 m), weight 58.514 kg (129 lb), SpO2 100 %.  PHYSICAL EXAMINATION:  GENERAL:  54 y.o.-year-old patient lying in the bed with no acute distress.  EYES: Pupils equal, round, reactive to light and accommodation. No scleral icterus.  HEENT: atraumatic , normocephalic.oropharynx and nasopharynx clear. NECK:  Supple, no jugular venous distention. No thyroid enlargement, no tenderness.  LUNGS: Normal breath sounds bilaterally, no wheezing, rales,rhonchi or crepitation. No use of accessory muscles of respiration.  CARDIOVASCULAR: S1, S2 normal. No murmurs, rubs, or gallops.  ABDOMEN: Soft, nontender, nondistended. Bowel sounds present. No organomegaly or mass.  EXTREMITIES: No pedal edema, cyanosis, or clubbing.  NEUROLOGIC: no acute focal defecits. Mild shaking. PSYCHIATRIC: The patient is awake and alert,  appears oriented well. SKIN: No obvious rash, lesion, or ulcer.    LABORATORY PANEL:   CBC No results for input(s): WBC, HGB, HCT, PLT in the last 168 hours. ------------------------------------------------------------------------------------------------------------------  Chemistries   Recent Labs Lab 11/16/15 0459  NA 144  K 4.6  CL 109  CO2 30  GLUCOSE 85  BUN 48*  CREATININE 1.10  CALCIUM 9.6   ------------------------------------------------------------------------------------------------------------------  Cardiac Enzymes No results for input(s): TROPONINI in the last 168 hours. ------------------------------------------------------------------------------------------------------------------  RADIOLOGY:  No results found.   ASSESSMENT AND PLAN:   # Hyponatremia due to SIADH -off  Lasix , on salt tablets, appreciate nephrologist input, sodium level stable now. Patient is on Depakote and Tegretol which could cause SIADH and hyponatremia Was on samsca, which was discontinued. On 1200 ml fluid restriction.   #Near syncope secondary to Symptomatic anemia with chronic abdominal pain Transfused 2 units of blood and repeated hemoglobin at 8.4 (on 11/10/15) , up from 7.1, no obvious bleeding, but  the patient was hypotensive.   Stool for occult blood was negative in the ED, repeated again Negative.  Continue Protonix oral.  CT chest, abdomen and pelvis are negative except for lower rib fractures.. EGD/Colonoscopy as OP with Dr. Allen Norris  is being planned.  # Hypotension, status post 2 units of blood cell  Transfusion, blood pressure has improved . Follow patient's blood pressure readings closely, patient is off Lasix, off Norvasc  and ACE inhibitor   Resumed propranolol on 11/14/15 due to shaking.   Increased dose. BP stable.- shaking is improving.  # B12 and Folate deficiency. Replacement started  # Failure to thrive - unintentional weight loss of 30 pounds in 3  months. CT head is negative for any metastases. CT chest, abdomen and pelvis -no malignancy  Patient is scheduled for EGD and colonoscopy as OP.  # Metabolic encephalopathy Ct head negative- improved now.  . Earlier was seen by psychiatric Dr.Clapacs and as per him patient did not have capacity to make decisions for himself and social worker was working on getting a guardianship for him. This patient is much more improvement in mental status and he appears very oriented now, and he does not feel he is unable to make any decisions or need any support, he is arguing against getting guardianship. So I will call psychiatric follow-up to reevaluate him.  # Partially healed lower rib fractures.  #. Essential tremors- Resume ( 11/14/15)  home propranolol, unless blood pressure readings are low .   stable now.  #. History of seizures On Depakote and Tegretol. . We will working on getting guardianship on him but now as he has significant improvement in mental status and decision capacity was reevaluated by psych and as per him now he has capacity so most likely will be able to discharge him home tomorrow.   CODE STATUS: Full code  TOTAL TIME TAKING CARE OF THIS PATIENT: 30 minutes.   Vaughan Basta M.D on 11/17/2015 at 4:05 PM  Between 7am to 6pm - Pager - [phone]  After 6pm go to [email or web address] - password [email or web address] [facility]  Office  [phone]  CC: Primary care physician; [name], MD

## 2015-11-17 NOTE — Progress Notes (Signed)
   11/17/15 1400  Clinical Encounter Type  Visited With Patient  Visit Type Initial  Provided pastoral presence and support to patient on unit.  [place] [phone]

## 2015-11-17 NOTE — Plan of Care (Signed)
Problem: Fluid Volume: Goal: Ability to maintain a balanced intake and output will improve Outcome: Progressing Plan of care. Progress to goals. 1. Pt free of injury this shift. 2. VSS. No complaints at this time. 3. Placement pending. Dorna Bloom RN

## 2015-11-17 NOTE — Progress Notes (Signed)
Pt's friend Mable Paris stopped by to help pt put together a health care POA.  Pt gave verbal consent for [place] to speak with this friend.  Pt's friend would like for Dr. Weber Cooks to call him.  Sticky note left for Dr. Weber Cooks.  Clarise Cruz, RN

## 2015-11-17 NOTE — Plan of Care (Addendum)
No c/o pain, pt is resting comfortably in bed  Problem: Safety: Goal: Ability to remain free from injury will improve Outcome: Progressing Pt is high falls, bed and chair alarm in use, call bell and phone within reach, hourly safety, pt has not had an impulsive behavior this shift  Problem: Tissue Perfusion: Goal: Risk factors for ineffective tissue perfusion will decrease Outcome: Progressing VSS, afebrile

## 2015-11-17 NOTE — Progress Notes (Signed)
[facility]  ROUNDING NOTE   Subjective:  Overall patient much more awake and alert this a.m. Discussed care with care management. We have asked Dr. Weber Cooks to reevaluate the patient as we feel he now has capacity to Make decisions for himself. BUN higher.  Na remains normal.   Objective:  Vital signs in last 24 hours:  Temp:  [98 F (36.7 C)-98.2 F (36.8 C)] 98 F (36.7 C) (12/01 0506) Pulse Rate:  [67-81] 79 (12/01 1036) Resp:  [18-19] 18 (12/01 0506) BP: (102-137)/(53-68) 111/63 mmHg (12/01 1036) SpO2:  [99 %-100 %] 100 % (12/01 0506) Weight:  [58.514 kg (129 lb)] 58.514 kg (129 lb) (12/01 0500)  Weight change: 1.769 kg (3 lb 14.4 oz) Filed Weights   11/15/15 0500 11/16/15 0500 11/17/15 0500  Weight: 55.157 kg (121 lb 9.6 oz) 56.745 kg (125 lb 1.6 oz) 58.514 kg (129 lb)    Intake/Output: I/O last 3 completed shifts: In: 723 [P.O.:720; I.V.:3] Out: 2350 [Urine:2350]   Intake/Output this shift:  Total I/O In: 240 [P.O.:240] Out: 200 [Urine:200]  Physical Exam: General: Awake, alert, no acute distress.  Head: Laceration on forehead  Moist oral mucosal membranes  Eyes: Anicteric  Neck: Supple, trachea midline  Lungs:  Clear to auscultation normal effort  Heart: Regular rate and rhythm, no rubs  Abdomen:  Soft, nontender, BS present  Extremities:  trace peripheral edema.  Neurologic: Awake, alert, following commands, tremors noted in hands  Skin: Forehead laceration healing       Basic Metabolic Panel:  Recent Labs Lab 11/12/15 0550 11/13/15 0554 11/15/15 0814 11/16/15 0459  NA 139 140 142 144  K 4.5  --  4.1 4.6  CL 103  --  106 109  CO2 31  --  30 30  GLUCOSE 89  --  107* 85  BUN 36*  --  44* 48*  CREATININE 1.13  --  1.14 1.10  CALCIUM 9.4  --  9.5 9.6    Liver Function Tests: No results for input(s): AST, ALT, ALKPHOS, BILITOT, PROT, ALBUMIN in the last 168 hours. No results for input(s): LIPASE, AMYLASE in the last 168  hours. No results for input(s): AMMONIA in the last 168 hours.  CBC: No results for input(s): WBC, NEUTROABS, HGB, HCT, MCV, PLT in the last 168 hours.  Cardiac Enzymes: No results for input(s): CKTOTAL, CKMB, CKMBINDEX, TROPONINI in the last 168 hours.  BNP: Invalid input(s): POCBNP  CBG: No results for input(s): GLUCAP in the last 168 hours.  Microbiology: Results for orders placed or performed in visit on 11/26/13  Urine culture     Status: None   Collection Time: 11/27/13  5:05 AM  Result Value Ref Range Status   Micro Text Report   Final       SOURCE: CLEAN CATCH    COMMENT                   MIXED BACTERIAL ORGANISMS   COMMENT                   RESULTS SUGGESTIVE OF CONTAMINATION   ANTIBIOTIC                                                      Clostridium Difficile [facility])     Status: None  Collection Time: 11/29/13  9:51 AM  Result Value Ref Range Status   Micro Text Report   Final       C.DIFFICILE ANTIGEN       C.DIFFICILE GDH ANTIGEN : NEGATIVE   C.DIFFICILE TOXIN A/B     C.DIFFICILE TOXINS A AND B : NEGATIVE   INTERPRETATION            Negative for C. difficile.    ANTIBIOTIC                                                        Coagulation Studies: No results for input(s): LABPROT, INR in the last 72 hours.  Urinalysis: No results for input(s): COLORURINE, LABSPEC, PHURINE, GLUCOSEU, HGBUR, BILIRUBINUR, KETONESUR, PROTEINUR, UROBILINOGEN, NITRITE, LEUKOCYTESUR in the last 72 hours.  Invalid input(s): APPERANCEUR    Imaging: No results found.   Medications:     . carbamazepine  200 mg Oral BID  . divalproex  750 mg Oral Q12H  . docusate sodium  100 mg Oral BID  . feeding supplement (ENSURE ENLIVE)  1 Bottle Oral TID BM  . folic acid  1 mg Oral Daily  . OLANZapine  5 mg Oral QHS  . pantoprazole  40 mg Oral Daily  . propranolol  40 mg Oral BID  . sodium chloride  3 mL Intravenous Q12H   acetaminophen **OR** acetaminophen,  butalbital-acetaminophen-caffeine, ondansetron **OR** ondansetron (ZOFRAN) IV  Assessment/ Plan:  54 y.o. male with hypertension, seizure disorder, chronic speech disorder, CKD stage II, anemia of CKD, SHPTH.  1.  Hyponatremia. 2.  CKD stage II 3.  Hypertension. 4.   Altered mental status.  Plan:  Overall the patient has improved over the course of the hospitalization. He is much more awake and alert now than he was a week ago. It appears that he does have the ability to make decisions for himself now. Therefore we have asked psychiatry to see him again. BUN noted to be high. We have discontinued sodium chloride tablets as well as Lasix. Continue to monitor BUN. Further plan as patient progresses.   LOS: Ojus, Jovani Flury 12/1/201610:44 AM

## 2015-11-17 NOTE — Clinical Social Work Note (Signed)
CSW updated Dr. Weber Cooks of the need for letter and request that pt be reevaluated per Dr. Anselm Jungling and Dr. Holley Raring. RNCM updated CSW that pt was reevaluated and pt has cleared and will be able to make own descisions. Per MD, pt will likely discharge home tomorrow. At that time, CSW will file a report with [facility] APS. CSW left a message with guardianship Education officer, museum. CSW will continue to follow.   Darden Dates, MSW, LCSW Clinical Social Worker  [phone]

## 2015-11-17 NOTE — Progress Notes (Signed)
Physical Therapy Treatment Patient Details Name: [name] MRN: [id number] DOB: 1961/09/19 Today's Date: 11/17/2015    History of Present Illness Pt is a 54 y.o. male admitted 10/27/15 with abdominal pain, general weakness, and lightheadedness x5 days.  Pt s/p 1 unit PRBC's d/t anemia.  Pt s/p fall 10/30/15 with lacerations on head.  CT of head negative for fx and acute intracranial findings (positive for small midline frontal scalp contusion).  Pt also with multiple B lower rib fx's (present prior to current admission).    PT Comments    Pt demonstrating good recall of cues given in yesterday's session regarding stair climbing with good compliance. Improved use of hands for sit to stand for safety, but continues with less than safe chair approach and poor use of hands. Re educated pt on chair approach and stand to sit. Pt feels ambulation is right about at baseline with the rolling walker, but is not able to manage without rolling walker. Pt feels he can manage well at home. Continue to recommend home health/supervision for safety. Continue PT progressing independence with all functional mobility.   Follow Up Recommendations  Home health PT;Supervision/Assistance - 24 hour     Equipment Recommendations  Rolling walker with 5" wheels    Recommendations for Other Services       Precautions / Restrictions Restrictions Weight Bearing Restrictions: No    Mobility  Bed Mobility Overal bed mobility: Modified Independent Bed Mobility: Supine to Sit     Supine to sit: Modified independent (Device/Increase time)     General bed mobility comments: Sits with use of rail  Transfers Overall transfer level: Needs assistance Equipment used: Rolling walker (2 wheeled) Transfers: Sit to/from Stand Sit to Stand: Min guard Stand pivot transfers:  (demonstrates use of safe hands with stand; cues for sit)       General transfer comment: Tremors near baseline according to pt; pt manages  well,but encouraged to use safe techniques with sit to/from stand.   Ambulation/Gait Ambulation/Gait assistance: Min guard;Supervision Ambulation Distance (Feet): 275 Feet Assistive device: Rolling walker (2 wheeled) Gait Pattern/deviations: Step-through pattern;Narrow base of support;Wide base of support (decreased clearance of feet at times clicking heels on swing)   Gait velocity interpretation: at or above normal speed for age/gender General Gait Details: No LOB; subjectively feels steady and near baseline. Alternated assist from Bruno guard to close supervision. No significant change in heart rate post ambulation   Stairs Stairs: Yes Stairs assistance: Min guard Stair Management: Two rails;Alternating pattern;Step to pattern (reciprocal ascending/step to descending) Number of Stairs: 12 General stair comments: Pt remembers cues from yesterdays session of placing foot entirely on step when ascending and slowing pace and complies well today  Wheelchair Mobility    Modified Rankin (Stroke Patients Only)       Balance           Standing balance support: Bilateral upper extremity supported Standing balance-Leahy Scale: Fair                      Cognition Arousal/Alertness: Awake/alert Behavior During Therapy: WFL for tasks assessed/performed Overall Cognitive Status: Within Functional Limits for tasks assessed                      Exercises      General Comments        Pertinent Vitals/Pain Pain Assessment: No/denies pain    Home Living  Prior Function            PT Goals (current goals can now be found in the care plan section) Progress towards PT goals: Progressing toward goals    Frequency  Min 2X/week    PT Plan Current plan remains appropriate    Co-evaluation             End of Session Equipment Utilized During Treatment: Gait belt Activity Tolerance: Patient tolerated treatment well Patient  left: in chair;with call bell/phone within reach;with chair alarm set     Time: 1147-1210 PT Time Calculation (min) (ACUTE ONLY): 23 min  Charges:  $Gait Training: 23-37 mins                    G Codes:      Charlaine Dalton 11/17/2015, 12:43 PM

## 2015-11-17 NOTE — Consult Note (Signed)
  Psychiatry: Follow-up consult specifically at the request of the treatment team today to reassess capacity for decision making. Patient interviewed. Chart reviewed. Case discussed with Dr. Zollie Scale and the social worker on the case.  This 54 year old man with a history of syncope a recent fall and what appears to be fairly debilitated self-care prior to coming into the hospital has had an extended hospital course which was made worse by a period of delirium after a head injury. The last time I saw him he was unable to communicate and was extremely delirious and therefore obviously lacked any capacity.  Patient has clearly improved dramatically since then. 54 year old man interviewed in his hospital bed room. He is sitting up in a chair and eating lunch. Patient has no psychiatric complaints. States that his mood feels fine. Denies having any hallucinations or psychotic symptoms. Denies any suicidal or homicidal ideation. Patient is able to articulate the reasons that he is in the hospital and given outline of his hospital course. He is able to name the medications that he is currently prescribed. I asked him to describe his daily routine at home and he was able to tell me how he got around and how he intended to continue to take care of himself. Patient acknowledges that there is higher risk of falls and of illness living alone and there would be if he were living in a controlled environment. He understands that the treatment team has recommended that he consider going to assisted living and has declined that recommendation.  Patient has been working appropriately with physical therapy on ambulation and is now able to ambulate independently. Apparently he was able to go up and down a couple of stairs in the last day or so.  One area that I am still concerned about is that his financial situation is very unclear to me. He has not worked in years and he does not receive any kind of disability or Sports administrator. 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  Overall however I find that this patient is able to understand his current medical condition, able to understand the risks and benefits involved in living independently, understands the suggestion of living in a assisted living facility. Based on all this I think he does have capacity to make medical decisions. I also don't find any evidence of another treatable mental health illness at this point.  Signing off once again. No medication indicated. Patient at this point has capacity to make decisions about his living situation and medical treatment.

## 2015-11-18 MED ORDER — CARBAMAZEPINE ER 200 MG PO TB12
200.0000 mg | ORAL_TABLET | Freq: Two times a day (BID) | ORAL | Status: DC
Start: 2015-11-18 — End: 2019-08-12

## 2015-11-18 MED ORDER — PROPRANOLOL HCL 40 MG PO TABS: 40.0000 mg | ORAL_TABLET | Freq: Two times a day (BID) | ORAL | Status: DC

## 2015-11-18 MED ORDER — OLANZAPINE 5 MG PO TABS: 5.0000 mg | ORAL_TABLET | Freq: Every day | ORAL | Status: DC

## 2015-11-18 MED ORDER — DIVALPROEX SODIUM 250 MG PO DR TAB: 750.0000 mg | DELAYED_RELEASE_TABLET | Freq: Two times a day (BID) | ORAL | Status: AC

## 2015-11-18 MED ORDER — FOLIC ACID 1 MG PO TABS: 1.0000 mg | ORAL_TABLET | Freq: Every day | ORAL | Status: DC

## 2015-11-18 NOTE — Care Management (Addendum)
Spoke with Devin. [name] at the bedside. States that his primary care physician is Dr. Lovie Bailey.  States that [employer] will be his power of attorney when he completes the paper work. She lives in [place]. [name] is another friend [phone]). States that Devin. [name] will be transporting him home or one of his staff members. States he gets his medications from [facility].  States he still drives and does his own errands. Would like to be followed by [facility] again. [name], [facility] representative updated. Will need to check  and see if [facility] can visit [name] again. [name] has no insurance at this time, but a Medicaid application was completed during this hospitalization. Devin Ammons RN MSN CCM Care Management [phone]

## 2015-11-18 NOTE — Discharge Instructions (Signed)
TOTAL FLUID INTAKE IN A DAY- SHOULD BE LESS THAN 1200 ML.

## 2015-11-18 NOTE — Plan of Care (Signed)
Problem: Fluid Volume: Goal: Ability to maintain a balanced intake and output will improve Outcome: Progressing Pt is alert and denies pain. Pt has been resting comfortable during shift. No other signs of distress noted. Will continue to monitor.

## 2015-11-18 NOTE — Progress Notes (Signed)
Patient discharge home per MD order. VSS. Prescriptions called to pharmacy. All discharge instructions given and all questions answered.

## 2015-11-18 NOTE — Clinical Social Work Note (Signed)
Clinical Social Worker updated [employer] worker of discharge plan and capacity. Pt is for discharge today. Pt will have [facility] following for HHPT and [facility] will also call in an APS report with [facility]. Pt has arranged his own transportation. CSW is signing off as no further needs identified.   Darden Dates, MSW, LCSW Clinical Social Worker  [phone]

## 2015-11-18 NOTE — Discharge Summary (Signed)
[facility] [place] NAME: [name]    MR#:  [id number]  DATE OF BIRTH:  1961-03-25  DATE OF ADMISSION:  10/27/2015 ADMITTING PHYSICIAN: Nicholes Mango, MD  DATE OF DISCHARGE: 11/18/2015  PRIMARY CARE PHYSICIAN: [name], MD    ADMISSION DIAGNOSIS:  Lightheadedness [R42] FTT (failure to thrive) in adult [R62.7] Symptomatic anemia [D64.9] Other fatigue [R53.83]  DISCHARGE DIAGNOSIS:  Principal Problem:   Acute delirium Active Problems:   Near syncope   Symptomatic anemia   Early satiety   Poverty   FTT (failure to thrive) in adult   Fall   Lightheadedness   Laceration of scalp   Altered mental state   SECONDARY DIAGNOSIS:   Past Medical History  Diagnosis Date  . Hypertension   . Seizures ([place])   . Kidney disease   . Anemia   . Migraine   . Hyponatremia     chronic    HOSPITAL COURSE:   # Hyponatremia due to SIADH -off Lasix , on salt tablets, appreciate nephrologist input, sodium level stable now. Patient is on Depakote and Tegretol which could cause SIADH and hyponatremia Was on samsca, which was discontinued. On 1200 ml fluid restriction.   #Near syncope secondary to Symptomatic anemia with chronic abdominal pain Transfused 2 units of blood and repeated hemoglobin at 8.4 (on 11/10/15) , up from 7.1, no obvious bleeding, but the patient was hypotensive.  Stool for occult blood was negative in the ED, repeated again Negative. Continue Protonix oral. CT chest, abdomen and pelvis are negative except for lower rib fractures.. EGD/Colonoscopy as OP with Dr. Allen Norris is being planned.  # Hypotension, status post 2 units of blood cell Transfusion, blood pressure has improved . Follow patient's blood pressure readings closely, patient is off Lasix, off Norvasc and ACE inhibitor  Resumed propranolol on 11/14/15 due to shaking.  Increased dose. BP stable.- shaking is improving.  # B12 and Folate  deficiency. Replacement started  # Failure to thrive - unintentional weight loss of 30 pounds in 3 months. CT head is negative for any metastases. CT chest, abdomen and pelvis -no malignancy  Patient is to be scheduled for EGD and colonoscopy as OP.  # Metabolic encephalopathy Ct head negative- improved now. . Earlier was seen by psychiatric Dr.Clapacs and as per him patient did not have capacity to make decisions for himself and social worker was working on getting a guardianship for him. This patient is much more improvement in mental status and he appears very oriented now, and he does not feel he is unable to make any decisions or need any support, he is arguing against getting guardianship. So I will call psychiatric follow-up to reevaluate him.  As per new evaluation by psych- pt have capacity to make his medical decisions.  # Partially healed lower rib fractures.  #. Essential tremors- Resume ( 11/14/15) home propranolol, unless blood pressure readings are low .  stable now.  #. History of seizures On Depakote and Tegretol.  DISCHARGE CONDITIONS:  Stable.  CONSULTS OBTAINED:  Treatment Team:  Nicholes Mango, MD Anthonette Legato, MD Dewain Penning, MD Gonzella Lex, MD  DRUG ALLERGIES:  No Known Allergies  DISCHARGE MEDICATIONS:   Current Discharge Medication List    START taking these medications   Details  carbamazepine (TEGRETOL XR) 200 MG 12 hr tablet Take 1 tablet (200 mg total) by mouth 2 (two) times daily. Qty: 60 tablet, Refills: 0  folic acid (FOLVITE) 1 MG tablet Take 1 tablet (1 mg total) by mouth daily. Qty: 30 tablet, Refills: 0    OLANZapine (ZYPREXA) 5 MG tablet Take 1 tablet (5 mg total) by mouth at bedtime. Qty: 30 tablet, Refills: 0      CONTINUE these medications which have CHANGED   Details  divalproex (DEPAKOTE) 250 MG DR tablet Take 3 tablets (750 mg total) by mouth every 12 (twelve) hours. Qty: 100 tablet, Refills: 1     propranolol (INDERAL) 40 MG tablet Take 1 tablet (40 mg total) by mouth 2 (two) times daily. Qty: 60 tablet, Refills: 0      CONTINUE these medications which have NOT CHANGED   Details  butalbital-acetaminophen-caffeine (FIORICET, ESGIC) 50-325-40 MG per tablet Take 1-2 tablets by mouth every 4 (four) hours as needed for migraine.    feeding supplement, ENSURE ENLIVE, (ENSURE ENLIVE) LIQD Take 237 mLs by mouth 2 (two) times daily between meals. Qty: 10 Bottle, Refills: 12    pantoprazole (PROTONIX) 40 MG tablet Take 1 tablet (40 mg total) by mouth daily. Qty: 30 tablet, Refills: 2      STOP taking these medications     amLODipine (NORVASC) 5 MG tablet      carbamazepine (TEGRETOL) 200 MG tablet      lisinopril (PRINIVIL,ZESTRIL) 20 MG tablet          DISCHARGE INSTRUCTIONS:    Follow with PMD in 2 weeks and with GI clinic in 1 month. Total daily fluid intake should be 1200 ml or less.  If you experience worsening of your admission symptoms, develop shortness of breath, life threatening emergency, suicidal or homicidal thoughts you must seek medical attention immediately by calling 911 or calling your MD immediately  if symptoms less severe.  You Must read complete instructions/literature along with all the possible adverse reactions/side effects for all the Medicines you take and that have been prescribed to you. Take any new Medicines after you have completely understood and accept all the possible adverse reactions/side effects.   Please note  You were cared for by a hospitalist during your hospital stay. If you have any questions about your discharge medications or the care you received while you were in the hospital after you are discharged, you can call the unit and asked to speak with the hospitalist on call if the hospitalist that took care of you is not available. Once you are discharged, your primary care physician will handle any further medical issues. Please note  that NO REFILLS for any discharge medications will be authorized once you are discharged, as it is imperative that you return to your primary care physician (or establish a relationship with a primary care physician if you do not have one) for your aftercare needs so that they can reassess your need for medications and monitor your lab values.    Today   CHIEF COMPLAINT:   Chief Complaint  Patient presents with  . Weakness    HISTORY OF PRESENT ILLNESS:  [name]  is a 54 y.o. male with a known history of multiple medical issues and recently admitted for weakness, near syncopal episode, abdominal pain and anemia. He is being managed by the medicine services with GI consult, scopes scheduled for Tuesday. He had a fall today around 7pm hitting his head on the IV pole and potentially the bedside table. He has a flat affect at baseline and only answered "yes" and "no". He has just returned from a CT scan  of the head and Surgery was called by Dr. Bridgett Larsson of medicine to assess 3 lacerations, one to frontal scalp and 2 to left temporal region.    VITAL SIGNS:  Blood pressure 103/52, pulse 63, temperature 98 F (36.7 C), temperature source Oral, resp. rate 18, height 5\' 3"  (1.6 m), weight 57.788 kg (127 lb 6.4 oz), SpO2 99 %.  I/O:   Intake/Output Summary (Last 24 hours) at 11/18/15 0920 Last data filed at 11/17/15 1700  Gross per 24 hour  Intake    480 ml  Output      0 ml  Net    480 ml    PHYSICAL EXAMINATION:   GENERAL: 54 y.o.-year-old patient lying in the bed with no acute distress.  EYES: Pupils equal, round, reactive to light and accommodation. No scleral icterus.  HEENT: atraumatic , normocephalic.oropharynx and nasopharynx clear. NECK: Supple, no jugular venous distention. No thyroid enlargement, no tenderness.  LUNGS: Normal breath sounds bilaterally, no wheezing, rales,rhonchi or crepitation. No use of accessory muscles of respiration.  CARDIOVASCULAR: S1, S2  normal. No murmurs, rubs, or gallops.  ABDOMEN: Soft, nontender, nondistended. Bowel sounds present. No organomegaly or mass.  EXTREMITIES: No pedal edema, cyanosis, or clubbing.  NEUROLOGIC: no acute focal defecits. Mild shaking. PSYCHIATRIC: The patient is awake and alert, appears oriented well. SKIN: No obvious rash, lesion, or ulcer.    DATA REVIEW:   CBC No results for input(s): WBC, HGB, HCT, PLT in the last 168 hours.  Chemistries   Recent Labs Lab 11/16/15 0459  NA 144  K 4.6  CL 109  CO2 30  GLUCOSE 85  BUN 48*  CREATININE 1.10  CALCIUM 9.6    Cardiac Enzymes No results for input(s): TROPONINI in the last 168 hours.  Microbiology Results  Results for orders placed or performed in visit on 11/26/13  Urine culture     Status: None   Collection Time: 11/27/13  5:05 AM  Result Value Ref Range Status   Micro Text Report   Final       SOURCE: CLEAN CATCH    COMMENT                   MIXED BACTERIAL ORGANISMS   COMMENT                   RESULTS SUGGESTIVE OF CONTAMINATION   ANTIBIOTIC                                                      Clostridium Difficile [facility])     Status: None   Collection Time: 11/29/13  9:51 AM  Result Value Ref Range Status   Micro Text Report   Final       C.DIFFICILE ANTIGEN       C.DIFFICILE GDH ANTIGEN : NEGATIVE   C.DIFFICILE TOXIN A/B     C.DIFFICILE TOXINS A AND B : NEGATIVE   INTERPRETATION            Negative for C. difficile.    ANTIBIOTIC  RADIOLOGY:  No results found.   Management plans discussed with the patient, family and they are in agreement.  CODE STATUS:     Code Status Orders        Start     Ordered   10/27/15 1157  Full code   Continuous     10/27/15 1157    Advance Directive Documentation        Most Recent Value   Type of Advance Directive  Living will   Pre-existing out of facility DNR order (yellow form or pink MOST form)      "MOST" Form in Place?        TOTAL TIME TAKING CARE OF THIS PATIENT: 35 minutes.    Vaughan Basta M.D on 11/18/2015 at 9:20 AM  Between 7am to 6pm - Pager - [phone]  After 6pm go to [email or web address] - password [email or web address] [facility]  Office  [phone]  CC: Primary care physician; [name], MD   Note: This dictation was prepared with Dragon dictation along with smaller phrase technology. Any transcriptional errors that result from this process are unintentional.

## 2015-12-23 ENCOUNTER — Other Ambulatory Visit: Payer: Self-pay

## 2015-12-26 ENCOUNTER — Ambulatory Visit: Payer: Self-pay | Admitting: Gastroenterology

## 2016-02-22 ENCOUNTER — Ambulatory Visit (INDEPENDENT_AMBULATORY_CARE_PROVIDER_SITE_OTHER): Payer: Self-pay | Admitting: Gastroenterology

## 2016-02-22 ENCOUNTER — Encounter: Payer: Self-pay | Admitting: Gastroenterology

## 2016-02-22 VITALS — BP 131/67 | HR 70 | Temp 97.4°F | Ht 63.0 in | Wt 146.0 lb

## 2016-02-22 DIAGNOSIS — D62 Acute posthemorrhagic anemia: Secondary | ICD-10-CM

## 2016-02-22 NOTE — Progress Notes (Signed)
Primary Care Physician: [name], MD  Primary Gastroenterologist:  Dr. Lucilla Lame  Chief Complaint  Patient presents with  . Follow up anemia    HPI: [name] is a 56 y.o. male here For follow-up after being in the hospital back in November. The patient was found to have anemia and was set up for an EGD and colonoscopy in the hospital but then had mental status changes. The patient has not followed up since then but did have blood work back in January 30 of this year by his primary care provider which found his hemoglobin to have gone up to 11. The patient was also noted in the hospital to be heme-negative from below. He denies any black stools or bloody stools. He also denies any abdominal pain nausea vomiting fevers or chills.  Current Outpatient Prescriptions  Medication Sig Dispense Refill  . carbamazepine (TEGRETOL XR) 200 MG 12 hr tablet Take 1 tablet (200 mg total) by mouth 2 (two) times daily. 60 tablet 0  . cholecalciferol (VITAMIN D) 1000 units tablet Take 5,000 Units by mouth daily.    . divalproex (DEPAKOTE) 250 MG DR tablet Take 3 tablets (750 mg total) by mouth every 12 (twelve) hours. 100 tablet 1  . lisinopril (PRINIVIL,ZESTRIL) 20 MG tablet Take by mouth.    . propranolol (INDERAL) 40 MG tablet Take 1 tablet (40 mg total) by mouth 2 (two) times daily. 60 tablet 0  . amLODipine (NORVASC) 5 MG tablet Take by mouth. Reported on 02/22/2016    . butalbital-acetaminophen-caffeine (FIORICET, ESGIC) 50-325-40 MG per tablet Take 1-2 tablets by mouth every 4 (four) hours as needed for migraine. Reported on 02/22/2016    . feeding supplement, ENSURE ENLIVE, (ENSURE ENLIVE) LIQD Take 237 mLs by mouth 2 (two) times daily between meals. (Patient not taking: Reported on 02/22/2016) 10 Bottle 12  . folic acid (FOLVITE) 1 MG tablet Take 1 tablet (1 mg total) by mouth daily. (Patient not taking: Reported on 02/22/2016) 30 tablet 0  . OLANZapine (ZYPREXA) 5 MG tablet Take 1 tablet (5 mg  total) by mouth at bedtime. (Patient not taking: Reported on 02/22/2016) 30 tablet 0  . pantoprazole (PROTONIX) 40 MG tablet Take 1 tablet (40 mg total) by mouth daily. (Patient not taking: Reported on 02/22/2016) 30 tablet 2   No current facility-administered medications for this visit.    Allergies as of 02/22/2016  . (No Known Allergies)    ROS:  General: Negative for anorexia, weight loss, fever, chills, fatigue, weakness. ENT: Negative for hoarseness, difficulty swallowing , nasal congestion. CV: Negative for chest pain, angina, palpitations, dyspnea on exertion, peripheral edema.  Respiratory: Negative for dyspnea at rest, dyspnea on exertion, cough, sputum, wheezing.  GI: See history of present illness. GU:  Negative for dysuria, hematuria, urinary incontinence, urinary frequency, nocturnal urination.  Endo: Negative for unusual weight change.    Physical Examination:   BP 131/67 mmHg  Pulse 70  Temp(Src) 97.4 F (36.3 C) (Oral)  Ht 5\' 3"  (1.6 m)  Wt 146 lb (66.225 kg)  BMI 25.87 kg/m2  General: Well-nourished, well-developed in no acute distress.  Eyes: No icterus. Conjunctivae pink. Mouth: Oropharyngeal mucosa moist and pink , no lesions erythema or exudate. Lungs: Clear to auscultation bilaterally. Non-labored. Heart: Regular rate and rhythm, no murmurs rubs or gallops.  Abdomen: Bowel sounds are normal, nontender, nondistended, no hepatosplenomegaly or masses, no abdominal bruits or hernia , no rebound or guarding.   Extremities: No lower extremity edema. No  clubbing or deformities. Neuro: Alert and oriented x 3.  Grossly intact. Skin: Warm and dry, no jaundice.   Psych: Alert and cooperative, normal mood and affect.  Labs:    Imaging Studies: No results found.  Assessment and Plan:   [name] is a 55 y.o. y/o male who comes in today with a history of being anemic. The patient was supposed to undergo a GI evaluation with an EGD and colonoscopy but had mental  status changes in the hospital and it was canceled. The patient is now here follow-up with that. The patient reports that he has no insurance at the present time and would like to hold off until he obtained insurance prior to having the procedures due to the high cost. The patient has been given my card and has been told that when he obtained insurance he should contact us. The patient states that he has to make a phone call this weekend should have insurance shortly.   Note: This dictation was prepared with Dragon dictation along with smaller phrase technology. Any transcriptional errors that result from this process are unintentional.

## 2016-03-13 ENCOUNTER — Emergency Department: Payer: Medicaid Other

## 2016-03-13 ENCOUNTER — Emergency Department
Admission: EM | Admit: 2016-03-13 | Discharge: 2016-03-13 | Disposition: A | Discharge status: Home / Self Care | Payer: Medicaid Other | Attending: Emergency Medicine | Admitting: Emergency Medicine

## 2016-03-13 ENCOUNTER — Encounter: Payer: Self-pay | Admitting: Emergency Medicine

## 2016-03-13 DIAGNOSIS — R569 Unspecified convulsions: Secondary | ICD-10-CM | POA: Diagnosis not present

## 2016-03-13 DIAGNOSIS — N183 Chronic kidney disease, stage 3 (moderate): Secondary | ICD-10-CM | POA: Insufficient documentation

## 2016-03-13 DIAGNOSIS — E871 Hypo-osmolality and hyponatremia: Secondary | ICD-10-CM | POA: Diagnosis not present

## 2016-03-13 DIAGNOSIS — G43909 Migraine, unspecified, not intractable, without status migrainosus: Secondary | ICD-10-CM | POA: Insufficient documentation

## 2016-03-13 DIAGNOSIS — R531 Weakness: Secondary | ICD-10-CM | POA: Diagnosis present

## 2016-03-13 DIAGNOSIS — Z79899 Other long term (current) drug therapy: Secondary | ICD-10-CM | POA: Diagnosis not present

## 2016-03-13 DIAGNOSIS — I129 Hypertensive chronic kidney disease with stage 1 through stage 4 chronic kidney disease, or unspecified chronic kidney disease: Secondary | ICD-10-CM | POA: Insufficient documentation

## 2016-03-13 DIAGNOSIS — H539 Unspecified visual disturbance: Secondary | ICD-10-CM | POA: Insufficient documentation

## 2016-03-13 DIAGNOSIS — G8929 Other chronic pain: Secondary | ICD-10-CM | POA: Diagnosis not present

## 2016-03-13 DIAGNOSIS — E860 Dehydration: Secondary | ICD-10-CM | POA: Diagnosis not present

## 2016-03-13 LAB — BASIC METABOLIC PANEL
ANION GAP: 6 (ref 5–15)
BUN: 30 mg/dL — ABNORMAL HIGH (ref 6–20)
CALCIUM: 8.8 mg/dL — AB (ref 8.9–10.3)
CHLORIDE: 104 mmol/L (ref 101–111)
CO2: 22 mmol/L (ref 22–32)
Creatinine, Ser: 1.51 mg/dL — ABNORMAL HIGH (ref 0.61–1.24)
GFR calc non Af Amer: 50 mL/min — ABNORMAL LOW (ref >60)
GFR, EST AFRICAN AMERICAN: 58 mL/min — AB (ref >60)
Glucose, Bld: 90 mg/dL (ref 65–99)
Potassium: 4.3 mmol/L (ref 3.5–5.1)
SODIUM: 132 mmol/L — AB (ref 135–145)

## 2016-03-13 LAB — URINALYSIS COMPLETE WITH MICROSCOPIC (ARMC ONLY)
BILIRUBIN URINE: NEGATIVE
Bacteria, UA: NONE SEEN
Glucose, UA: NEGATIVE mg/dL
HGB URINE DIPSTICK: NEGATIVE
KETONES UR: NEGATIVE mg/dL
LEUKOCYTES UA: NEGATIVE
Nitrite: NEGATIVE
PH: 5 (ref 5.0–8.0)
PROTEIN: NEGATIVE mg/dL
RBC / HPF: NONE SEEN RBC/hpf (ref 0–5)
SPECIFIC GRAVITY, URINE: 1.01 (ref 1.005–1.030)

## 2016-03-13 LAB — CBC
HCT: 32.6 % — ABNORMAL LOW (ref 40.0–52.0)
HEMOGLOBIN: 11.2 g/dL — AB (ref 13.0–18.0)
MCH: 33.3 pg (ref 26.0–34.0)
MCHC: 34.4 g/dL (ref 32.0–36.0)
MCV: 96.9 fL (ref 80.0–100.0)
Platelets: 190 10*3/uL (ref 150–440)
RBC: 3.37 MIL/uL — AB (ref 4.40–5.90)
RDW: 14.4 % (ref 11.5–14.5)
WBC: 4.2 10*3/uL (ref 3.8–10.6)

## 2016-03-13 LAB — CARBAMAZEPINE LEVEL, TOTAL: Carbamazepine Lvl: 10 ug/mL (ref 4.0–12.0)

## 2016-03-13 LAB — VALPROIC ACID LEVEL: Valproic Acid Lvl: 75 ug/mL (ref 50.0–100.0)

## 2016-03-13 MED ORDER — FAMOTIDINE 20 MG PO TABS: 20.0000 mg | ORAL_TABLET | Freq: Two times a day (BID) | ORAL | Status: DC

## 2016-03-13 MED ORDER — ONDANSETRON 4 MG PO TBDP: 4.0000 mg | ORAL_TABLET | Freq: Three times a day (TID) | ORAL | Status: DC | PRN

## 2016-03-13 NOTE — ED Notes (Signed)
Lab call regarding add on's will add on at this time

## 2016-03-13 NOTE — ED Notes (Signed)
Pt ambulated to bathroom by self with no concerns at this time.

## 2016-03-13 NOTE — ED Provider Notes (Signed)
[facility] Emergency Department Provider Note  ____________________________________________  Time seen: 2:20 PM  I have reviewed the triage vital signs and the nursing notes.   HISTORY  Chief Complaint Weakness and Diplopia    HPI [name] is a 55 y.o. male who complains of decreased appetite and generalized weakness and double vision for the past 2-3 days. No recent falls. Had a fall about 3 months ago resulting in chronic left chest wall and left thumb pain. Denies headaches. No neck pain. No fever chills or vomiting.  Several months ago he had required physical therapy and had been using a walker. After 3 weeks on the walker he was able to ambulate independently. After 3 more weeks of physical therapy he was able to go downstairs. He now completes all his ADLs without any assistance, driving to the store and doing all his own shopping.  Has been compliant with all medical therapy. No changes in medication doses recently. Had been on Protonix in the past but discontinued recently.  The double vision is intermittent, and not present when wearing sunglasses. Only present when wearing his regular glasses.   Past Medical History  Diagnosis Date  . Hypertension   . Seizures ([place])   . Kidney disease   . Anemia   . Migraine   . Hyponatremia     chronic     Patient Active Problem List   Diagnosis Date Noted  . Altered mental state   . Acute delirium 10/31/2015  . Fall   . Lightheadedness   . Laceration of scalp   . Near syncope 10/27/2015  . Poverty 10/27/2015  . Symptomatic anemia   . Early satiety   . FTT (failure to thrive) in adult   . Protein-calorie malnutrition, severe ([place]) 09/07/2015  . Hyponatremia 09/05/2015  . Elevated troponin 09/05/2015  . Hypokalemia 09/05/2015  . Leukopenia 09/05/2015  . Renal insufficiency 09/05/2015  . Tremor, essential 07/15/2015  . Essential hypertension 07/15/2015  . Chronic renal insufficiency  07/15/2015  . Leg swelling 07/15/2015  . Partial epilepsy with impairment of consciousness ([place]) 05/19/2015  . Chronic kidney disease (CKD), stage III (moderate) 05/19/2015  . Absolute anemia 05/19/2015  . Chest pain 04/17/2015     Past Surgical History  Procedure Laterality Date  . None       Current Outpatient Rx  Name  Route  Sig  Dispense  Refill  . amLODipine (NORVASC) 5 MG tablet   Oral   Take by mouth. Reported on 02/22/2016         . butalbital-acetaminophen-caffeine (FIORICET, ESGIC) 50-325-40 MG per tablet   Oral   Take 1-2 tablets by mouth every 4 (four) hours as needed for migraine. Reported on 02/22/2016         . carbamazepine (TEGRETOL XR) 200 MG 12 hr tablet   Oral   Take 1 tablet (200 mg total) by mouth 2 (two) times daily.   60 tablet   0   . cholecalciferol (VITAMIN D) 1000 units tablet   Oral   Take 5,000 Units by mouth daily.         . divalproex (DEPAKOTE) 250 MG DR tablet   Oral   Take 3 tablets (750 mg total) by mouth every 12 (twelve) hours.   100 tablet   1   . famotidine (PEPCID) 20 MG tablet   Oral   Take 1 tablet (20 mg total) by mouth 2 (two) times daily.   60 tablet   0   .  feeding supplement, ENSURE ENLIVE, (ENSURE ENLIVE) LIQD   Oral   Take 237 mLs by mouth 2 (two) times daily between meals. Patient not taking: Reported on 02/22/2016   10 Bottle   12   . folic acid (FOLVITE) 1 MG tablet   Oral   Take 1 tablet (1 mg total) by mouth daily. Patient not taking: Reported on 02/22/2016   30 tablet   0   . lisinopril (PRINIVIL,ZESTRIL) 20 MG tablet   Oral   Take by mouth.         . OLANZapine (ZYPREXA) 5 MG tablet   Oral   Take 1 tablet (5 mg total) by mouth at bedtime. Patient not taking: Reported on 02/22/2016   30 tablet   0   . ondansetron (ZOFRAN ODT) 4 MG disintegrating tablet   Oral   Take 1 tablet (4 mg total) by mouth every 8 (eight) hours as needed for nausea or vomiting.   20 tablet   0   . pantoprazole  (PROTONIX) 40 MG tablet   Oral   Take 1 tablet (40 mg total) by mouth daily. Patient not taking: Reported on 02/22/2016   30 tablet   2   . propranolol (INDERAL) 40 MG tablet   Oral   Take 1 tablet (40 mg total) by mouth 2 (two) times daily.   60 tablet   0      Allergies Review of patient's allergies indicates no known allergies.   Family History  Problem Relation Age of Onset  . Heart attack Father   . Kidney disease Father     Social History Social History  Substance Use Topics  . Smoking status: Never Smoker   . Smokeless tobacco: Never Used  . Alcohol Use: No    Review of Systems  Constitutional:   No fever or chills. No weight changes Eyes:  Double vision.  ENT:   No sore throat. No rhinorrhea. Cardiovascular:   No chest pain. Respiratory:   No dyspnea or cough. Gastrointestinal:   Negative for abdominal pain, vomiting and diarrhea.  No BRBPR or melena. Genitourinary:   Difficulty urinating when first waking up but no dysuria or pain. Area.. Musculoskeletal:   Negative for focal pain or swelling Skin:   Negative for rash. Neurological:   Negative for headaches, focal weakness or numbness. Positive generalized weakness  10-point ROS otherwise negative.  ____________________________________________   PHYSICAL EXAM:  VITAL SIGNS: ED Triage Vitals  Enc Vitals Group     BP 03/13/16 1230 129/82 mmHg     Pulse Rate 03/13/16 1230 61     Resp 03/13/16 1230 18     Temp 03/13/16 1230 98.1 F (36.7 C)     Temp Source 03/13/16 1230 Oral     SpO2 03/13/16 1230 99 %     Weight 03/13/16 1230 146 lb (66.225 kg)     Height 03/13/16 1230 5\' 3"  (1.6 m)     Head Cir --      Peak Flow --      Pain Score --      Pain Loc --      Pain Edu? --      Excl. in [place]? --     Vital signs reviewed, nursing assessments reviewed.   Constitutional:   Alert and oriented. Well appearing and in no distress. Calm and comfortable. phenoypically male dressed in women's  clothing Eyes:   No scleral icterus. No conjunctival pallor. PERRL. EOMI. No nystagmus ENT  Head:   Normocephalic and atraumatic.   Nose:   No congestion/rhinnorhea. No septal hematoma   Mouth/Throat:   MMM, no pharyngeal erythema. No peritonsillar mass.    Neck:   No stridor. No SubQ emphysema. No meningismus. Hematological/Lymphatic/Immunilogical:   No cervical lymphadenopathy. Cardiovascular:   RRR. Symmetric bilateral radial and DP pulses.  No murmurs.  Respiratory:   Normal respiratory effort without tachypnea nor retractions. Breath sounds are clear with somewhat diminished air entry in the right upper lung without crackles or wheezing. No wheezes/rales/rhonchi. Gastrointestinal:   Soft and nontender. Non distended. There is no CVA tenderness.  No rebound, rigidity, or guarding. Genitourinary:   Normal external genitalia Musculoskeletal:   Nontender with normal range of motion in all extremities. No joint effusions.  No lower extremity tenderness.  No edema. Neurologic:   Normal speech and language.  CN 2-10 normal. Motor grossly intact. Normal ambulation with steady gait No gross focal neurologic deficits are appreciated.  Skin:    Skin is warm, dry and intact. No rash noted.  No petechiae, purpura, or bullae. Psychiatric:   Mood and affect are normal. ____________________________________________    LABS (pertinent positives/negatives) (all labs ordered are listed, but only abnormal results are displayed) Labs Reviewed  BASIC METABOLIC PANEL - Abnormal; Notable for the following:    Sodium 132 (*)    BUN 30 (*)    Creatinine, Ser 1.51 (*)    Calcium 8.8 (*)    GFR calc non Af Amer 50 (*)    GFR calc Af Amer 58 (*)    All other components within normal limits  CBC - Abnormal; Notable for the following:    RBC 3.37 (*)    Hemoglobin 11.2 (*)    HCT 32.6 (*)    All other components within normal limits  URINALYSIS COMPLETEWITH MICROSCOPIC ([facility] ONLY) -  Abnormal; Notable for the following:    Color, Urine YELLOW (*)    APPearance CLEAR (*)    Squamous Epithelial / LPF 0-5 (*)    All other components within normal limits  CARBAMAZEPINE LEVEL, TOTAL  VALPROIC ACID LEVEL  CBG MONITORING, ED   ____________________________________________   EKG  Interpreted by me Normal sinus rhythm rate of 63, normal axis and intervals. Normal QRS ST segments and T waves.  ____________________________________________    RADIOLOGY  CT head unremarkable Chest x-ray unremarkable  ____________________________________________   PROCEDURES   ____________________________________________   INITIAL IMPRESSION / ASSESSMENT AND PLAN / ED COURSE  Pertinent labs & imaging results that were available during my care of the patient were reviewed by me and considered in my medical decision making (see chart for details).  Patient presents with generalized weakness. Does have a history of anemia and hyponatremia, not present on today's laboratory examination. Vital signs are normal. He is drinking fluids in the emergency department and walking with a steady gait. Lucid and calm. Depakote and Tegretol levels are within normal limits of therapeutic range, urinalysis is unremarkable. At present time workup is entirely negative and the patient is well-appearing and not in distress. As the only somewhat focal symptom is that he seems to get full more easily and only able to eat half a meal and has decreased his fluid intake over the last few days, I think that there is a component of dehydration which is corroborated by the slight elevation of the creatinine to 1.5. However the patient is tolerating oral intake very well in the emergency department and can orally hydrate. Additionally  he is to be on Protonix and discontinue this. That can cause rebound gastritis which may be a component of this as well. We'll start the patient on an H2 blocker and Zofran as needed. He  has an appointment with his primary care doctor this week already.  Eyes the visual disturbance seems to be intermittent and dependent on what I wear he is using, I doubt that it is anything serious. Low suspicion for meningitis encephalitis, orbital cellulitis, temporal arteritis, CRAO/CRVO. no Evidence of glaucoma. No evidence of trauma. Low suspicion for stroke or intracranial hemorrhage or intracranial hypertension.     ____________________________________________   FINAL CLINICAL IMPRESSION(S) / ED DIAGNOSES  Final diagnoses:  Mild dehydration  Generalized weakness  Visual disturbance      Carrie Mew, MD 03/13/16 [phone]

## 2016-03-13 NOTE — ED Notes (Signed)
Decreased appetite, weakness and double vision per pt.

## 2016-03-13 NOTE — Discharge Instructions (Signed)
Dehydration, Adult Dehydration is a condition in which you do not have enough fluid or water in your body. It happens when you take in less fluid than you lose. Vital organs such as the kidneys, brain, and heart cannot function without a proper amount of fluids. Any loss of fluids from the body can cause dehydration.  Dehydration can range from mild to severe. This condition should be treated right away to help prevent it from becoming severe. CAUSES  This condition may be caused by:  Vomiting.  Diarrhea.  Excessive sweating, such as when exercising in hot or humid weather.  Not drinking enough fluid during strenuous exercise or during an illness.  Excessive urine output.  Fever.  Certain medicines. RISK FACTORS This condition is more likely to develop in:  People who are taking certain medicines that cause the body to lose excess fluid (diuretics).   People who have a chronic illness, such as diabetes, that may increase urination.  Older adults.   People who live at high altitudes.   People who participate in endurance sports.  SYMPTOMS  Mild Dehydration  Thirst.  Dry lips.  Slightly dry mouth.  Dry, warm skin. Moderate Dehydration  Very dry mouth.   Muscle cramps.   Dark urine and decreased urine production.   Decreased tear production.   Headache.   Light-headedness, especially when you stand up from a sitting position.  Severe Dehydration  Changes in skin.   Cold and clammy skin.   Skin does not spring back quickly when lightly pinched and released.   Changes in body fluids.   Extreme thirst.   No tears.   Not able to sweat when body temperature is high, such as in hot weather.   Minimal urine production.   Changes in vital signs.   Rapid, weak pulse (more than 100 beats per minute when you are sitting still).   Rapid breathing.   Low blood pressure.   Other changes.   Sunken eyes.   Cold hands and feet.    Confusion.  Lethargy and difficulty being awakened.  Fainting (syncope).   Short-term weight loss.   Unconsciousness. DIAGNOSIS  This condition may be diagnosed based on your symptoms. You may also have tests to determine how severe your dehydration is. These tests may include:   Urine tests.   Blood tests.  TREATMENT  Treatment for this condition depends on the severity. Mild or moderate dehydration can often be treated at home. Treatment should be started right away. Do not wait until dehydration becomes severe. Severe dehydration needs to be treated at the hospital. Treatment for Mild Dehydration  Drinking plenty of water to replace the fluid you have lost.   Replacing minerals in your blood (electrolytes) that you may have lost.  Treatment for Moderate Dehydration  Consuming oral rehydration solution (ORS). Treatment for Severe Dehydration  Receiving fluid through an IV tube.   Receiving electrolyte solution through a feeding tube that is passed through your nose and into your stomach (nasogastric tube or NG tube).  Correcting any abnormalities in electrolytes. HOME CARE INSTRUCTIONS   Drink enough fluid to keep your urine clear or pale yellow.   Drink water or fluid slowly by taking small sips. You can also try sucking on ice cubes.  Have food or beverages that contain electrolytes. Examples include bananas and sports drinks.  Take over-the-counter and prescription medicines only as told by your health care provider.   Prepare ORS according to the manufacturer's instructions. Take sips  of ORS every 5 minutes until your urine returns to normal.  If you have vomiting or diarrhea, continue to try to drink water, ORS, or both.   If you have diarrhea, avoid:   Beverages that contain caffeine.   Fruit juice.   Milk.   Carbonated soft drinks.  Do not take salt tablets. This can lead to the condition of having too much sodium in your body  (hypernatremia).  SEEK MEDICAL CARE IF:  You cannot eat or drink without vomiting.  You have had moderate diarrhea during a period of more than 24 hours.  You have a fever. SEEK IMMEDIATE MEDICAL CARE IF:   You have extreme thirst.  You have severe diarrhea.  You have not urinated in 6-8 hours, or you have urinated only a small amount of very dark urine.  You have shriveled skin.  You are dizzy, confused, or both.   This information is not intended to replace advice given to you by your health care provider. Make sure you discuss any questions you have with your health care provider.   Document Released: 12/03/2005 Document Revised: 08/24/2015 Document Reviewed: 04/20/2015 Elsevier Interactive Patient Education 2016 [employer].  Visual Disturbances You have had a disturbance in your vision. This may be caused by various conditions, such as:  Migraines. Migraine headaches are often preceded by a disturbance in vision. Blind spots or light flashes are followed by a headache. This type of visual disturbance is temporary. It does not damage the eye.  Glaucoma. This is caused by increased pressure in the eye. Symptoms include haziness, blurred vision, or seeing rainbow colored circles when looking at bright lights. Partial or complete visual loss can occur. You may or may not experience eye pain. Visual loss may be gradual or sudden and is irreversible. Glaucoma is the leading cause of blindness.  Retina problems. Vision will be reduced if the retina becomes detached or if there is a circulation problem as with diabetes, high blood pressure, or a mini-stroke. Symptoms include seeing "floaters," flashes of light, or shadows, as if a curtain has fallen over your eye.  Optic nerve problems. The main nerve in your eye can be damaged by redness, soreness, and swelling (inflammation), poor circulation, drugs, and toxins. It is very important to have a complete exam done by a specialist to  determine the exact cause of your eye problem. The specialist may recommend medicines or surgery, depending on the cause of the problem. This can help prevent further loss of vision or reduce the risk of having a stroke. Contact the caregiver to whom you have been referred and arrange for follow-up care right away. SEEK IMMEDIATE MEDICAL CARE IF:   Your vision gets worse.  You develop severe headaches.  You have any weakness or numbness in the face, arms, or legs.  You have any trouble speaking or walking.   This information is not intended to replace advice given to you by your health care provider. Make sure you discuss any questions you have with your health care provider.   Document Released: 01/10/2005 Document Revised: 02/25/2012 Document Reviewed: 05/12/2014 Elsevier Interactive Patient Education 2016 Elsevier Inc.  Weakness Weakness is a lack of strength. It may be felt all over the body (generalized) or in one specific part of the body (focal). Some causes of weakness can be serious. You may need further medical evaluation, especially if you are elderly or you have a history of immunosuppression (such as chemotherapy or HIV), kidney disease, heart  disease, or diabetes. CAUSES  Weakness can be caused by many different things, including:  Infection.  Physical exhaustion.  Internal bleeding or other blood loss that results in a lack of red blood cells (anemia).  Dehydration. This cause is more common in elderly people.  Side effects or electrolyte abnormalities from medicines, such as pain medicines or sedatives.  Emotional distress, anxiety, or depression.  Circulation problems, especially severe peripheral arterial disease.  Heart disease, such as rapid atrial fibrillation, bradycardia, or heart failure.  Nervous system disorders, such as Guillain-Barr syndrome, multiple sclerosis, or stroke. DIAGNOSIS  To find the cause of your weakness, your caregiver will take your  history and perform a physical exam. Lab tests or X-rays may also be ordered, if needed. TREATMENT  Treatment of weakness depends on the cause of your symptoms and can vary greatly. HOME CARE INSTRUCTIONS   Rest as needed.  Eat a well-balanced diet.  Try to get some exercise every day.  Only take over-the-counter or prescription medicines as directed by your caregiver. SEEK MEDICAL CARE IF:   Your weakness seems to be getting worse or spreads to other parts of your body.  You develop new aches or pains. SEEK IMMEDIATE MEDICAL CARE IF:   You cannot perform your normal daily activities, such as getting dressed and feeding yourself.  You cannot walk up and down stairs, or you feel exhausted when you do so.  You have shortness of breath or chest pain.  You have difficulty moving parts of your body.  You have weakness in only one area of the body or on only one side of the body.  You have a fever.  You have trouble speaking or swallowing.  You cannot control your bladder or bowel movements.  You have black or bloody vomit or stools. MAKE SURE YOU:  Understand these instructions.  Will watch your condition.  Will get help right away if you are not doing well or get worse.   This information is not intended to replace advice given to you by your health care provider. Make sure you discuss any questions you have with your health care provider.   Document Released: 12/03/2005 Document Revised: 06/03/2012 Document Reviewed: 02/01/2012 Elsevier Interactive Patient Education [employer].

## 2016-03-14 ENCOUNTER — Telehealth: Payer: Self-pay | Admitting: Gastroenterology

## 2016-03-14 NOTE — Telephone Encounter (Signed)
Patient called and said he needs to set up appointment for a colonoscopy and endoscopy

## 2016-03-16 ENCOUNTER — Other Ambulatory Visit: Payer: Self-pay

## 2016-03-16 DIAGNOSIS — D649 Anemia, unspecified: Secondary | ICD-10-CM

## 2016-03-16 MED ORDER — PEG 3350-KCL-NABCB-NACL-NASULF 236 G PO SOLR: 4000.0000 mL | Freq: Once | ORAL | Status: DC

## 2016-03-19 NOTE — Telephone Encounter (Signed)
Pt scheduled for colonoscopy and EGD on 05/01/16 at [facility]. Instructs/rx mailed.

## 2016-03-21 ENCOUNTER — Telehealth: Payer: Self-pay | Admitting: Gastroenterology

## 2016-03-21 NOTE — Telephone Encounter (Signed)
After reading info about prep solutions he has some questions to ask you before anything is taken.

## 2016-03-21 NOTE — Telephone Encounter (Signed)
Left vm for pt to return my call.  

## 2016-03-22 NOTE — Telephone Encounter (Signed)
Yes it is fine

## 2016-03-22 NOTE — Telephone Encounter (Signed)
Pt received his Golytely yesterday and was concerned about his health issues while drinking solution. He has several issues that was on the pamphlet that stated he needed to contact us about. Kidney disease, HTN, Low blood count. Please advise if drinking this is still okay for him.

## 2016-03-23 NOTE — Telephone Encounter (Signed)
Contacted pt back regarding his question about the prep solution and notified him per Dr. Allen Norris this medication is safe.

## 2016-04-25 ENCOUNTER — Telehealth: Payer: Self-pay

## 2016-04-25 NOTE — Telephone Encounter (Signed)
Patient called in with multiple questions about prep, diet prior to colonoscopy, and medication questions. Informed patient that he would continue all Epilepsy medications throughout prep and that he will need to stay well hydrated throughout prep with electrolyte fluids. Answered all questions thoroughly and patient verbalizes understanding of this. Spent 25 minutes on the phone answering patient questions.

## 2016-04-29 ENCOUNTER — Emergency Department: Payer: Medicaid Other

## 2016-04-29 ENCOUNTER — Emergency Department
Admission: EM | Admit: 2016-04-29 | Discharge: 2016-04-29 | Disposition: A | Discharge status: Home / Self Care | Payer: Medicaid Other | Attending: Emergency Medicine | Admitting: Emergency Medicine

## 2016-04-29 ENCOUNTER — Encounter: Payer: Self-pay | Admitting: Emergency Medicine

## 2016-04-29 DIAGNOSIS — R1013 Epigastric pain: Secondary | ICD-10-CM | POA: Diagnosis not present

## 2016-04-29 DIAGNOSIS — Z8669 Personal history of other diseases of the nervous system and sense organs: Secondary | ICD-10-CM | POA: Insufficient documentation

## 2016-04-29 DIAGNOSIS — K2970 Gastritis, unspecified, without bleeding: Secondary | ICD-10-CM | POA: Insufficient documentation

## 2016-04-29 DIAGNOSIS — R3911 Hesitancy of micturition: Secondary | ICD-10-CM | POA: Diagnosis not present

## 2016-04-29 DIAGNOSIS — I129 Hypertensive chronic kidney disease with stage 1 through stage 4 chronic kidney disease, or unspecified chronic kidney disease: Secondary | ICD-10-CM | POA: Diagnosis not present

## 2016-04-29 DIAGNOSIS — N183 Chronic kidney disease, stage 3 (moderate): Secondary | ICD-10-CM | POA: Insufficient documentation

## 2016-04-29 DIAGNOSIS — Z79899 Other long term (current) drug therapy: Secondary | ICD-10-CM | POA: Diagnosis not present

## 2016-04-29 DIAGNOSIS — R0789 Other chest pain: Secondary | ICD-10-CM | POA: Diagnosis present

## 2016-04-29 LAB — BASIC METABOLIC PANEL
ANION GAP: 14 (ref 5–15)
BUN: 28 mg/dL — ABNORMAL HIGH (ref 6–20)
CALCIUM: 9.7 mg/dL (ref 8.9–10.3)
CO2: 23 mmol/L (ref 22–32)
CREATININE: 1.26 mg/dL — AB (ref 0.61–1.24)
Chloride: 97 mmol/L — ABNORMAL LOW (ref 101–111)
GFR calc Af Amer: >60 mL/min (ref >60)
GFR calc non Af Amer: >60 mL/min (ref >60)
GLUCOSE: 111 mg/dL — AB (ref 65–99)
Potassium: 5 mmol/L (ref 3.5–5.1)
SODIUM: 134 mmol/L — AB (ref 135–145)

## 2016-04-29 LAB — HEPATIC FUNCTION PANEL
ALT: 16 U/L — AB (ref 17–63)
AST: 27 U/L (ref 15–41)
Albumin: 4.3 g/dL (ref 3.5–5.0)
Alkaline Phosphatase: 42 U/L (ref 38–126)
BILIRUBIN DIRECT: 0.3 mg/dL (ref 0.1–0.5)
BILIRUBIN INDIRECT: 0.6 mg/dL (ref 0.3–0.9)
BILIRUBIN TOTAL: 0.9 mg/dL (ref 0.3–1.2)
Total Protein: 7.6 g/dL (ref 6.5–8.1)

## 2016-04-29 LAB — URINALYSIS COMPLETE WITH MICROSCOPIC (ARMC ONLY)
BACTERIA UA: NONE SEEN
Bilirubin Urine: NEGATIVE
Glucose, UA: NEGATIVE mg/dL
HGB URINE DIPSTICK: NEGATIVE
Ketones, ur: NEGATIVE mg/dL
LEUKOCYTES UA: NEGATIVE
Nitrite: NEGATIVE
PH: 5 (ref 5.0–8.0)
PROTEIN: NEGATIVE mg/dL
SQUAMOUS EPITHELIAL / LPF: NONE SEEN
Specific Gravity, Urine: 1.016 (ref 1.005–1.030)

## 2016-04-29 LAB — TROPONIN I: Troponin I: <0.03 ng/mL (ref <0.031)

## 2016-04-29 LAB — CBC
HCT: 33.3 % — ABNORMAL LOW (ref 40.0–52.0)
HEMOGLOBIN: 11.2 g/dL — AB (ref 13.0–18.0)
MCH: 33.1 pg (ref 26.0–34.0)
MCHC: 33.5 g/dL (ref 32.0–36.0)
MCV: 98.7 fL (ref 80.0–100.0)
Platelets: 221 10*3/uL (ref 150–440)
RBC: 3.37 MIL/uL — ABNORMAL LOW (ref 4.40–5.90)
RDW: 13.9 % (ref 11.5–14.5)
WBC: 8.7 10*3/uL (ref 3.8–10.6)

## 2016-04-29 LAB — LIPASE, BLOOD: Lipase: 34 U/L (ref 11–51)

## 2016-04-29 MED ORDER — ONDANSETRON HCL 4 MG/2ML IJ SOLN
4.0000 mg | Freq: Once | INTRAMUSCULAR | Status: AC
  Administered 2016-04-29: 4 mg via INTRAVENOUS
  Filled 2016-04-29: qty 2

## 2016-04-29 MED ORDER — ASPIRIN 81 MG PO CHEW
324.0000 mg | CHEWABLE_TABLET | Freq: Once | ORAL | Status: AC
  Administered 2016-04-29: 324 mg via ORAL
  Filled 2016-04-29: qty 4

## 2016-04-29 MED ORDER — FAMOTIDINE 20 MG PO TABS: 20.0000 mg | ORAL_TABLET | Freq: Two times a day (BID) | ORAL | Status: DC

## 2016-04-29 MED ORDER — GI COCKTAIL ~~LOC~~
30.0000 mL | ORAL | Status: AC
  Administered 2016-04-29: 30 mL via ORAL
  Filled 2016-04-29: qty 30

## 2016-04-29 MED ORDER — FAMOTIDINE 20 MG PO TABS
40.0000 mg | ORAL_TABLET | Freq: Once | ORAL | Status: AC
  Administered 2016-04-29: 40 mg via ORAL
  Filled 2016-04-29: qty 2

## 2016-04-29 MED ORDER — SODIUM CHLORIDE 0.9 % IV BOLUS (SEPSIS)
1000.0000 mL | Freq: Once | INTRAVENOUS | Status: AC
  Administered 2016-04-29: 1000 mL via INTRAVENOUS

## 2016-04-29 NOTE — ED Provider Notes (Addendum)
[facility] Emergency Department Provider Note  ____________________________________________  Time seen: 2:10 AM  I have reviewed the triage vital signs and the nursing notes.   HISTORY  Chief Complaint Chest Pain    HPI [name] is a 55 y.o. male who complains of sudden onset of chest tightness, central anterior chest, nonradiating, moderate intensity, intermittent episodes lasting a few minutes, better with lying down, worse with standing up, never had anything like this before, started around 1:00 AM. Has been sick recently with runny nose and sore throat over the last few days. Also constipation for 4 days. No vomiting but has had some nausea today.     Past Medical History  Diagnosis Date  . Hypertension   . Seizures ([place])   . Kidney disease   . Anemia   . Migraine   . Hyponatremia     chronic     Patient Active Problem List   Diagnosis Date Noted  . Altered mental state   . Acute delirium 10/31/2015  . Fall   . Lightheadedness   . Laceration of scalp   . Near syncope 10/27/2015  . Poverty 10/27/2015  . Symptomatic anemia   . Early satiety   . FTT (failure to thrive) in adult   . Protein-calorie malnutrition, severe ([place]) 09/07/2015  . Hyponatremia 09/05/2015  . Elevated troponin 09/05/2015  . Hypokalemia 09/05/2015  . Leukopenia 09/05/2015  . Renal insufficiency 09/05/2015  . Tremor, essential 07/15/2015  . Essential hypertension 07/15/2015  . Chronic renal insufficiency 07/15/2015  . Leg swelling 07/15/2015  . Partial epilepsy with impairment of consciousness ([place]) 05/19/2015  . Chronic kidney disease (CKD), stage III (moderate) 05/19/2015  . Absolute anemia 05/19/2015  . Chest pain 04/17/2015     Past Surgical History  Procedure Laterality Date  . None       Current Outpatient Rx  Name  Route  Sig  Dispense  Refill  . amLODipine (NORVASC) 5 MG tablet   Oral   Take by mouth. Reported on 02/22/2016         .  carbamazepine (TEGRETOL XR) 200 MG 12 hr tablet   Oral   Take 1 tablet (200 mg total) by mouth 2 (two) times daily.   60 tablet   0   . cholecalciferol (VITAMIN D) 1000 units tablet   Oral   Take 5,000 Units by mouth daily.         . divalproex (DEPAKOTE) 250 MG DR tablet   Oral   Take 3 tablets (750 mg total) by mouth every 12 (twelve) hours. Patient taking differently: Take 500 mg by mouth every 12 (twelve) hours.    100 tablet   1   . famotidine (PEPCID) 20 MG tablet   Oral   Take 1 tablet (20 mg total) by mouth 2 (two) times daily.   60 tablet   0   . lisinopril (PRINIVIL,ZESTRIL) 20 MG tablet   Oral   Take by mouth.         . ondansetron (ZOFRAN ODT) 4 MG disintegrating tablet   Oral   Take 1 tablet (4 mg total) by mouth every 8 (eight) hours as needed for nausea or vomiting.   20 tablet   0   . propranolol (INDERAL) 40 MG tablet   Oral   Take 1 tablet (40 mg total) by mouth 2 (two) times daily.   60 tablet   0      Allergies Review of patient's allergies indicates  no known allergies.   Family History  Problem Relation Age of Onset  . Heart attack Father   . Kidney disease Father     Social History Social History  Substance Use Topics  . Smoking status: Never Smoker   . Smokeless tobacco: Never Used  . Alcohol Use: No    Review of Systems  Constitutional:   No fever or chills.  Eyes:   No vision changes.  ENT:   Positive sore throat and runny nose. Cardiovascular:   Positive as above chest pain. Respiratory:   No dyspnea or cough. Gastrointestinal:   Negative for abdominal pain, vomiting and diarrhea. Positive constipation Genitourinary:   Positive for decreased urinary output today. Musculoskeletal:   Negative for focal pain or swelling Neurological:   Negative for headaches 10-point ROS otherwise negative.  ____________________________________________   PHYSICAL EXAM:  VITAL SIGNS: ED Triage Vitals  Enc Vitals Group     BP  04/29/16 0150 146/78 mmHg     Pulse Rate 04/29/16 0150 53     Resp 04/29/16 0150 16     Temp 04/29/16 0150 98.9 F (37.2 C)     Temp Source 04/29/16 0150 Oral     SpO2 04/29/16 0141 99 %     Weight --      Height --      Head Cir --      Peak Flow --      Pain Score --      Pain Loc --      Pain Edu? --      Excl. in [place]? --     Vital signs reviewed, nursing assessments reviewed.   Constitutional:   Alert and oriented. Well appearing and in no distress. Eyes:   No scleral icterus. No conjunctival pallor. PERRL. EOMI.  No nystagmus. ENT   Head:   Normocephalic and atraumatic.   Nose:   No congestion/rhinnorhea. No septal hematoma   Mouth/Throat:   Dry mucous membranes, no pharyngeal erythema. No peritonsillar mass.    Neck:   No stridor. No SubQ emphysema. No meningismus. Hematological/Lymphatic/Immunilogical:   No cervical lymphadenopathy. Cardiovascular:   RRR. Symmetric bilateral radial and DP pulses.  No murmurs.  Respiratory:   Normal respiratory effort without tachypnea nor retractions. Breath sounds are clear and equal bilaterally. No wheezes/rales/rhonchi. Gastrointestinal:   Soft and nontender. Non distended. There is no CVA tenderness.  No rebound, rigidity, or guarding. Genitourinary:   deferred Musculoskeletal:   Nontender with normal range of motion in all extremities. No joint effusions.  No lower extremity tenderness.  No edema. Neurologic:   Normal speech and language.  CN 2-10 normal. Motor grossly intact. No gross focal neurologic deficits are appreciated.  Skin:    Skin is warm, dry and intact. No rash noted.  No petechiae, purpura, or bullae.  ____________________________________________    LABS (pertinent positives/negatives) (all labs ordered are listed, but only abnormal results are displayed) Labs Reviewed  BASIC METABOLIC PANEL - Abnormal; Notable for the following:    Sodium 134 (*)    Chloride 97 (*)    Glucose, Bld 111 (*)    BUN  28 (*)    Creatinine, Ser 1.26 (*)    All other components within normal limits  CBC - Abnormal; Notable for the following:    RBC 3.37 (*)    Hemoglobin 11.2 (*)    HCT 33.3 (*)    All other components within normal limits  HEPATIC FUNCTION PANEL - Abnormal; Notable for  the following:    ALT 16 (*)    All other components within normal limits  TROPONIN I  LIPASE, BLOOD  TROPONIN I  URINALYSIS COMPLETEWITH MICROSCOPIC ([facility] ONLY)   ____________________________________________   EKG  Interpreted by me Sinus bradycardia rate of 48, normal axis and intervals. Normal QRS. Diffuse 1 mg ST elevation, unchanged from 03/13/2016. T waves are unremarkable. Not indicative of acute ischemia.  ____________________________________________    RADIOLOGY  Abdomen x-ray series unremarkable  ____________________________________________   PROCEDURES   ____________________________________________   INITIAL IMPRESSION / ASSESSMENT AND PLAN / ED COURSE  Pertinent labs & imaging results that were available during my care of the patient were reviewed by me and considered in my medical decision making (see chart for details).  Patient well appearing no acute distress, complains of a typical chest pain. We'll give GI cocktail as well as aspirin, check labs at x-ray  ----------------------------------------- 6:42 AM on 04/29/2016 -----------------------------------------  Symptoms largely resolved, now just complains of lower abdominal pain and inability to urinate despite feeling like he needs to. Does have some mild suprapubic tenderness at this time, exam is otherwise unremarkable. Troponin negative 2.Considering the patient's symptoms, medical history, and physical examination today, I have low suspicion for ACS, PE, TAD, pneumothorax, carditis, mediastinitis, pneumonia, CHF, or sepsis.  I have low suspicion for cholecystitis or biliary pathology, pancreatitis, perforation or bowel  obstruction, hernia, intra-abdominal abscess, AAA or dissection, volvulus or intussusception, mesenteric ischemia, or appendicitis.  Bladder scan shows a bladder volume of about 350. Will For a urine sample. Given the very brief duration of the symptoms and the mild degree to which the patient appears to have urinary retention, don't think that'll be necessary to place a Foley catheter at this time. We'll check urine and drain the bladder, and have the patient follow closely with primary care and urology for continued monitoring of symptoms. Urinalysis negative. Other symptoms appear to be due to gastritis and acid reflux and I'll prescribe the patient an H2 blocker.        ____________________________________________   FINAL CLINICAL IMPRESSION(S) / ED DIAGNOSES  Final diagnoses:  Urinary hesitancy  Epigastric pain  Gastritis       Portions of this note were generated with dragon dictation software. Dictation errors may occur despite best attempts at proofreading.   Carrie Mew, MD 04/29/16 0715  Carrie Mew, MD 04/29/16 [phone]

## 2016-04-29 NOTE — ED Notes (Signed)
Patient returned from XR. 

## 2016-04-29 NOTE — ED Notes (Signed)
Patient transported to X-ray 

## 2016-04-29 NOTE — ED Notes (Signed)
Pt verbalized understanding of discharge instructions. NAD at this time. 

## 2016-04-29 NOTE — Discharge Instructions (Signed)

## 2016-04-29 NOTE — ED Notes (Signed)
Patient states an hour ago he had a heaviness on his chest that made him have sweats and weakness.  He is stating "something is wrong with me" during triage.  He is stating his chest pain was 5/10.  He is stating he can "barely breathe" during triage but is 100% on RA.

## 2016-04-30 ENCOUNTER — Encounter: Payer: Self-pay | Admitting: *Deleted

## 2016-05-01 ENCOUNTER — Ambulatory Visit: Payer: Medicaid Other | Admitting: Anesthesiology

## 2016-05-01 ENCOUNTER — Encounter
Admission: RE | Disposition: A | Discharge status: Home / Self Care | Payer: Self-pay | Source: Ambulatory Visit | Attending: Gastroenterology

## 2016-05-01 ENCOUNTER — Ambulatory Visit
Admission: RE | Admit: 2016-05-01 | Discharge: 2016-05-01 | Disposition: A | Discharge status: Home / Self Care | Payer: Medicaid Other | Source: Ambulatory Visit | Attending: Gastroenterology | Admitting: Gastroenterology

## 2016-05-01 ENCOUNTER — Encounter: Payer: Self-pay | Admitting: *Deleted

## 2016-05-01 DIAGNOSIS — N289 Disorder of kidney and ureter, unspecified: Secondary | ICD-10-CM | POA: Insufficient documentation

## 2016-05-01 DIAGNOSIS — K2970 Gastritis, unspecified, without bleeding: Secondary | ICD-10-CM | POA: Diagnosis not present

## 2016-05-01 DIAGNOSIS — E871 Hypo-osmolality and hyponatremia: Secondary | ICD-10-CM | POA: Diagnosis not present

## 2016-05-01 DIAGNOSIS — I10 Essential (primary) hypertension: Secondary | ICD-10-CM | POA: Insufficient documentation

## 2016-05-01 DIAGNOSIS — Z841 Family history of disorders of kidney and ureter: Secondary | ICD-10-CM | POA: Insufficient documentation

## 2016-05-01 DIAGNOSIS — Z79899 Other long term (current) drug therapy: Secondary | ICD-10-CM | POA: Insufficient documentation

## 2016-05-01 DIAGNOSIS — D509 Iron deficiency anemia, unspecified: Secondary | ICD-10-CM | POA: Insufficient documentation

## 2016-05-01 DIAGNOSIS — K219 Gastro-esophageal reflux disease without esophagitis: Secondary | ICD-10-CM | POA: Diagnosis not present

## 2016-05-01 DIAGNOSIS — R569 Unspecified convulsions: Secondary | ICD-10-CM | POA: Diagnosis not present

## 2016-05-01 DIAGNOSIS — G43909 Migraine, unspecified, not intractable, without status migrainosus: Secondary | ICD-10-CM | POA: Diagnosis not present

## 2016-05-01 DIAGNOSIS — D122 Benign neoplasm of ascending colon: Secondary | ICD-10-CM | POA: Diagnosis not present

## 2016-05-01 DIAGNOSIS — R12 Heartburn: Secondary | ICD-10-CM | POA: Diagnosis not present

## 2016-05-01 DIAGNOSIS — K3189 Other diseases of stomach and duodenum: Secondary | ICD-10-CM | POA: Diagnosis not present

## 2016-05-01 DIAGNOSIS — Z8249 Family history of ischemic heart disease and other diseases of the circulatory system: Secondary | ICD-10-CM | POA: Insufficient documentation

## 2016-05-01 HISTORY — DX: Gastro-esophageal reflux disease without esophagitis: K21.9

## 2016-05-01 HISTORY — PX: COLONOSCOPY WITH PROPOFOL: SHX5780

## 2016-05-01 HISTORY — PX: ESOPHAGOGASTRODUODENOSCOPY (EGD) WITH PROPOFOL: SHX5813

## 2016-05-01 LAB — URINE CULTURE: Culture: NO GROWTH

## 2016-05-01 SURGERY — COLONOSCOPY WITH PROPOFOL
Anesthesia: General

## 2016-05-01 MED ORDER — PROPOFOL 500 MG/50ML IV EMUL
INTRAVENOUS | Status: DC | PRN
  Administered 2016-05-01: 75 ug/kg/min via INTRAVENOUS

## 2016-05-01 MED ORDER — FENTANYL CITRATE (PF) 100 MCG/2ML IJ SOLN
INTRAMUSCULAR | Status: DC | PRN
  Administered 2016-05-01: 50 ug via INTRAVENOUS

## 2016-05-01 MED ORDER — LACTATED RINGERS IV SOLN
INTRAVENOUS | Status: DC | PRN
  Administered 2016-05-01: 09:00:00 via INTRAVENOUS

## 2016-05-01 MED ORDER — MIDAZOLAM HCL 2 MG/2ML IJ SOLN
INTRAMUSCULAR | Status: DC | PRN
  Administered 2016-05-01: 1 mg via INTRAVENOUS

## 2016-05-01 MED ORDER — SODIUM CHLORIDE 0.9 % IV SOLN
INTRAVENOUS | Status: DC
  Administered 2016-05-01: 1000 mL via INTRAVENOUS

## 2016-05-01 NOTE — Op Note (Signed)
[facility] Gastroenterology Patient Name: [name] Procedure Date: 05/01/2016 8:25 AM MRN: [id number] Account #: [id number] Date of Birth: 1961-12-13 Admit Type: Outpatient Age: 55 Room: [facility] ENDO ROOM 4 Gender: Male Note Status: Finalized Procedure:            Colonoscopy Indications:          Iron deficiency anemia Providers:            Lucilla Lame, MD Referring MD:         Youlanda Roys. Lovie Macadamia, MD (Referring MD) Medicines:            Propofol per Anesthesia Complications:        No immediate complications. Procedure:            Pre-Anesthesia Assessment:                       - Prior to the procedure, a History and Physical was                        performed, and patient medications and allergies were                        reviewed. The patient's tolerance of previous                        anesthesia was also reviewed. The risks and benefits of                        the procedure and the sedation options and risks were                        discussed with the patient. All questions were                        answered, and informed consent was obtained. Prior                        Anticoagulants: The patient has taken no previous                        anticoagulant or antiplatelet agents. ASA Grade                        Assessment: II - A patient with mild systemic disease.                        After reviewing the risks and benefits, the patient was                        deemed in satisfactory condition to undergo the                        procedure.                       After obtaining informed consent, the colonoscope was                        passed under direct vision. Throughout the procedure,  the patient's blood pressure, pulse, and oxygen                        saturations were monitored continuously. The                        Colonoscope was introduced through the anus and                        advanced to  the the cecum, identified by appendiceal                        orifice and ileocecal valve. The colonoscopy was                        performed without difficulty. The patient tolerated the                        procedure well. The quality of the bowel preparation                        was excellent. Findings:      The perianal and digital rectal examinations were normal.      Two sessile polyps were found in the ascending colon. The polyps were 2       to 4 mm in size. These polyps were removed with a cold biopsy forceps.       Resection and retrieval were complete. Impression:           - Two 2 to 4 mm polyps in the ascending colon, removed                        with a cold biopsy forceps. Resected and retrieved. Recommendation:       - Await pathology results.                       - Repeat colonoscopy in 5 years if polyp adenoma and 10                        years if hyperplastic Procedure Code(s):    --- Professional ---                       [phone], Colonoscopy, flexible; with biopsy, single or                        multiple Diagnosis Code(s):    --- Professional ---                       D50.9, Iron deficiency anemia, unspecified                       D12.2, Benign neoplasm of ascending colon CPT copyright 2016 American [employer]. All rights reserved. The codes documented in this report are preliminary and upon coder review may  be revised to meet current compliance requirements. Lucilla Lame, MD 05/01/2016 8:55:45 AM This report has been signed electronically. Number of Addenda: 0 Note Initiated On: 05/01/2016 8:25 AM Scope Withdrawal Time: 0 hours 6 minutes 17 seconds  Total Procedure Duration: 0 hours 9 minutes 4 seconds  [facility]

## 2016-05-01 NOTE — Transfer of Care (Signed)
Immediate Anesthesia Transfer of Care Note  Patient: [name]  Procedure(s) Performed: Procedure(s): COLONOSCOPY WITH PROPOFOL (N/A) ESOPHAGOGASTRODUODENOSCOPY (EGD) WITH PROPOFOL (N/A)  Patient Location: PACU  Anesthesia Type:General  Level of Consciousness: awake and alert   Airway & Oxygen Therapy: Patient Spontanous Breathing  Post-op Assessment: Report given to RN  Post vital signs: Reviewed and stable  Last Vitals:  Filed Vitals:   05/01/16 0801 05/01/16 0858  BP: 171/74 143/75  Pulse: 55 75  Temp: 36 C 36.9 C  Resp: 16 16    Last Pain: There were no vitals filed for this visit.       Complications: No apparent anesthesia complications

## 2016-05-01 NOTE — Anesthesia Postprocedure Evaluation (Signed)
Anesthesia Post Note  Patient: [name]  Procedure(s) Performed: Procedure(s) (LRB): COLONOSCOPY WITH PROPOFOL (N/A) ESOPHAGOGASTRODUODENOSCOPY (EGD) WITH PROPOFOL (N/A)  Patient location during evaluation: Endoscopy Anesthesia Type: General Level of consciousness: awake and alert Pain management: pain level controlled Vital Signs Assessment: post-procedure vital signs reviewed and stable Respiratory status: spontaneous breathing, nonlabored ventilation, respiratory function stable and patient connected to nasal cannula oxygen Cardiovascular status: blood pressure returned to baseline and stable Postop Assessment: no signs of nausea or vomiting Anesthetic complications: no    Last Vitals:  Filed Vitals:   05/01/16 0858 05/01/16 0859  BP: 143/75 113/67  Pulse: 75 63  Temp: 36.9 C 36.1 C  Resp: 16 12    Last Pain: There were no vitals filed for this visit.               Martha Clan

## 2016-05-01 NOTE — Op Note (Signed)
[facility] Patient Name: [name] Procedure Date: 05/01/2016 8:26 AM MRN: [id number] Account #: [id number] Date of Birth: Nov 04, 1961 Admit Type: Outpatient Age: 55 Room: [facility] ENDO ROOM 4 Gender: Male Note Status: Finalized Procedure:            Upper GI endoscopy Indications:          Iron deficiency anemia, Heartburn Providers:            Lucilla Lame, MD Referring MD:         Youlanda Roys. Lovie Macadamia, MD (Referring MD) Medicines:            Propofol per Anesthesia Complications:        No immediate complications. Procedure:            Pre-Anesthesia Assessment:                       - Prior to the procedure, a History and Physical was                        performed, and patient medications and allergies were                        reviewed. The patient's tolerance of previous                        anesthesia was also reviewed. The risks and benefits of                        the procedure and the sedation options and risks were                        discussed with the patient. All questions were                        answered, and informed consent was obtained. Prior                        Anticoagulants: The patient has taken no previous                        anticoagulant or antiplatelet agents. ASA Grade                        Assessment: II - A patient with mild systemic disease.                        After reviewing the risks and benefits, the patient was                        deemed in satisfactory condition to undergo the                        procedure.                       After obtaining informed consent, the endoscope was                        passed under direct vision. Throughout the procedure,  the patient's blood pressure, pulse, and oxygen                        saturations were monitored continuously. The Endoscope                        was introduced through the mouth, and advanced to the                     second part of duodenum. The upper GI endoscopy was                        accomplished without difficulty. The patient tolerated                        the procedure well. Findings:      The examined esophagus was normal.      Localized mild inflammation characterized by erythema was found in the       gastric antrum. Biopsies were taken with a cold forceps for histology.      The examined duodenum was normal. Biopsies were taken with a cold       forceps for histology. Impression:           - Normal esophagus.                       - Gastritis. Biopsied.                       - Normal examined duodenum. Biopsied. Recommendation:       - Await pathology results.                       - Perform a colonoscopy today. Procedure Code(s):    --- Professional ---                       [phone], Esophagogastroduodenoscopy, flexible, transoral;                        with biopsy, single or multiple Diagnosis Code(s):    --- Professional ---                       D50.9, Iron deficiency anemia, unspecified                       R12, Heartburn                       K29.70, Gastritis, unspecified, without bleeding CPT copyright 2016 American Medical Association. All rights reserved. The codes documented in this report are preliminary and upon coder review may  be revised to meet current compliance requirements. Lucilla Lame, MD 05/01/2016 8:43:26 AM This report has been signed electronically. Number of Addenda: 0 Note Initiated On: 05/01/2016 8:26 AM      [facility]

## 2016-05-01 NOTE — Anesthesia Preprocedure Evaluation (Signed)
Anesthesia Evaluation  Patient identified by MRN, date of birth, ID band Patient awake    Reviewed: Allergy & Precautions, H&P , NPO status , Patient's Chart, lab work & pertinent test results, reviewed documented beta blocker date and time   Airway Mallampati: III  TM Distance: >3 FB Neck ROM: full    Dental no notable dental hx. (+) Caps, Poor Dentition   Pulmonary neg pulmonary ROS,    Pulmonary exam normal breath sounds clear to auscultation       Cardiovascular Exercise Tolerance: Good hypertension, (-) angina(-) CAD, (-) Past MI, (-) Cardiac Stents and (-) CABG Normal cardiovascular exam(-) dysrhythmias (-) Valvular Problems/Murmurs Rhythm:regular Rate:Normal     Neuro/Psych Seizures -, Well Controlled,  PSYCHIATRIC DISORDERS negative psych ROS   GI/Hepatic Neg liver ROS, GERD  ,  Endo/Other  negative endocrine ROS  Renal/GU Renal disease  negative genitourinary   Musculoskeletal   Abdominal   Peds  Hematology  (+) Blood dyscrasia, anemia ,   Anesthesia Other Findings Past Medical History:   Hypertension                                                 Seizures (HCC)                                               Anemia                                                       Migraine                                                     Hyponatremia                                                   Comment:chronic   GERD (gastroesophageal reflux disease)                       Kidney disease                                               Reproductive/Obstetrics negative OB ROS                             Anesthesia Physical Anesthesia Plan  ASA: II  Anesthesia Plan: General   Post-op Pain Management:    Induction:   Airway Management Planned:   Additional Equipment:   Intra-op Plan:   Post-operative Plan:   Informed Consent: I have reviewed the patients History and Physical,  chart, labs and discussed the procedure  including the risks, benefits and alternatives for the proposed anesthesia with the patient or authorized representative who has indicated his/her understanding and acceptance.   Dental Advisory Given  Plan Discussed with: Anesthesiologist, CRNA and Surgeon  Anesthesia Plan Comments:         Anesthesia Quick Evaluation

## 2016-05-01 NOTE — H&P (Signed)
[facility]  [address], [place], [place] [address] Phone: [phone] Fax : [phone]  Primary Care Physician:  [name], MD Primary Gastroenterologist:  Dr. Allen Norris  Pre-Procedure History & Physical: HPI:  [name] is a 55 y.o. male is here for an endoscopy and colonoscopy.   Past Medical History  Diagnosis Date  . Hypertension   . Seizures ([place])   . Anemia   . Migraine   . Hyponatremia     chronic  . GERD (gastroesophageal reflux disease)   . Kidney disease     Past Surgical History  Procedure Laterality Date  . None      Prior to Admission medications   Medication Sig Start Date End Date Taking? Authorizing Provider  carbamazepine (TEGRETOL XR) 200 MG 12 hr tablet Take 1 tablet (200 mg total) by mouth 2 (two) times daily. 11/18/15  Yes Vaughan Basta, MD  divalproex (DEPAKOTE) 250 MG DR tablet Take 3 tablets (750 mg total) by mouth every 12 (twelve) hours. Patient taking differently: Take 500 mg by mouth every 12 (twelve) hours.  11/18/15  Yes Vaughan Basta, MD  propranolol (INDERAL) 40 MG tablet Take 1 tablet (40 mg total) by mouth 2 (two) times daily. 11/18/15  Yes Vaughan Basta, MD  cholecalciferol (VITAMIN D) 1000 units tablet Take 5,000 Units by mouth daily.    Historical Provider, MD  famotidine (PEPCID) 20 MG tablet Take 1 tablet (20 mg total) by mouth 2 (two) times daily. 04/29/16   Carrie Mew, MD  lisinopril (PRINIVIL,ZESTRIL) 20 MG tablet Take by mouth.    Historical Provider, MD  ondansetron (ZOFRAN ODT) 4 MG disintegrating tablet Take 1 tablet (4 mg total) by mouth every 8 (eight) hours as needed for nausea or vomiting. 03/13/16   Carrie Mew, MD    Allergies as of 03/16/2016  . (No Known Allergies)    Family History  Problem Relation Age of Onset  . Heart attack Father   . Kidney disease Father     Social History   Social History  . Marital Status: Single    Spouse Name: N/A    . Number of Children: N/A  . Years of Education: N/A   Occupational History  . Not on file.   Social History Main Topics  . Smoking status: Never Smoker   . Smokeless tobacco: Never Used  . Alcohol Use: No  . Drug Use: No  . Sexual Activity: Not on file   Other Topics Concern  . Not on file   Social History Narrative   Cross dresses     Review of Systems: See HPI, otherwise negative ROS  Physical Exam: BP 171/74 mmHg  Pulse 55  Temp(Src) 96.8 F (36 C) (Tympanic)  Resp 16  Ht 5\' 3"  (1.6 m)  Wt 146 lb (66.225 kg)  BMI 25.87 kg/m2  SpO2 100% General:   Alert,  pleasant and cooperative in NAD Head:  Normocephalic and atraumatic. Neck:  Supple; no masses or thyromegaly. Lungs:  Clear throughout to auscultation.    Heart:  Regular rate and rhythm. Abdomen:  Soft, nontender and nondistended. Normal bowel sounds, without guarding, and without rebound.   Neurologic:  Alert and  oriented x4;  grossly normal neurologically.  Impression/Plan: [name] is here for an endoscopy and colonoscopy to be performed for anemia  Risks, benefits, limitations, and alternatives regarding  endoscopy and colonoscopy have been reviewed with the patient.  Questions have been answered.  All parties agreeable.  Lucilla Lame, MD  05/01/2016, 8:29 AM

## 2016-05-02 ENCOUNTER — Encounter: Payer: Self-pay | Admitting: Gastroenterology

## 2016-05-03 ENCOUNTER — Encounter: Payer: Self-pay | Admitting: Gastroenterology

## 2016-07-10 LAB — SURGICAL PATHOLOGY

## 2016-08-10 ENCOUNTER — Other Ambulatory Visit: Payer: Self-pay | Admitting: Neurology

## 2016-08-10 DIAGNOSIS — H532 Diplopia: Secondary | ICD-10-CM

## 2016-08-10 DIAGNOSIS — H5500 Unspecified nystagmus: Secondary | ICD-10-CM

## 2016-08-28 ENCOUNTER — Ambulatory Visit
Admission: RE | Admit: 2016-08-28 | Discharge: 2016-08-28 | Disposition: A | Discharge status: Home / Self Care | Payer: Medicaid Other | Source: Ambulatory Visit | Attending: Neurology | Admitting: Neurology

## 2016-08-28 DIAGNOSIS — H5500 Unspecified nystagmus: Secondary | ICD-10-CM | POA: Diagnosis present

## 2016-08-28 DIAGNOSIS — H532 Diplopia: Secondary | ICD-10-CM | POA: Diagnosis not present

## 2016-09-11 ENCOUNTER — Inpatient Hospital Stay
Admission: EM | Admit: 2016-09-11 | Discharge: 2016-09-15 | DRG: 641 | Disposition: A | Discharge status: Home / Self Care | Payer: Medicaid Other | Attending: Internal Medicine | Admitting: Internal Medicine

## 2016-09-11 ENCOUNTER — Encounter: Payer: Self-pay | Admitting: Emergency Medicine

## 2016-09-11 DIAGNOSIS — E871 Hypo-osmolality and hyponatremia: Secondary | ICD-10-CM | POA: Diagnosis present

## 2016-09-11 DIAGNOSIS — K219 Gastro-esophageal reflux disease without esophagitis: Secondary | ICD-10-CM | POA: Diagnosis present

## 2016-09-11 DIAGNOSIS — R42 Dizziness and giddiness: Secondary | ICD-10-CM | POA: Diagnosis present

## 2016-09-11 DIAGNOSIS — N2581 Secondary hyperparathyroidism of renal origin: Secondary | ICD-10-CM | POA: Diagnosis present

## 2016-09-11 DIAGNOSIS — G40909 Epilepsy, unspecified, not intractable, without status epilepticus: Secondary | ICD-10-CM | POA: Diagnosis present

## 2016-09-11 DIAGNOSIS — D631 Anemia in chronic kidney disease: Secondary | ICD-10-CM | POA: Diagnosis present

## 2016-09-11 DIAGNOSIS — I129 Hypertensive chronic kidney disease with stage 1 through stage 4 chronic kidney disease, or unspecified chronic kidney disease: Secondary | ICD-10-CM | POA: Diagnosis present

## 2016-09-11 DIAGNOSIS — N182 Chronic kidney disease, stage 2 (mild): Secondary | ICD-10-CM | POA: Diagnosis present

## 2016-09-11 DIAGNOSIS — Z79899 Other long term (current) drug therapy: Secondary | ICD-10-CM

## 2016-09-11 DIAGNOSIS — R531 Weakness: Secondary | ICD-10-CM

## 2016-09-11 DIAGNOSIS — R479 Unspecified speech disturbances: Secondary | ICD-10-CM | POA: Diagnosis present

## 2016-09-11 LAB — SODIUM, URINE, RANDOM: Sodium, Ur: 84 mmol/L

## 2016-09-11 LAB — CBC
HCT: 31.6 % — ABNORMAL LOW (ref 40.0–52.0)
Hemoglobin: 11.1 g/dL — ABNORMAL LOW (ref 13.0–18.0)
MCH: 34.5 pg — ABNORMAL HIGH (ref 26.0–34.0)
MCHC: 35.1 g/dL (ref 32.0–36.0)
MCV: 98.3 fL (ref 80.0–100.0)
PLATELETS: 156 10*3/uL (ref 150–440)
RBC: 3.22 MIL/uL — AB (ref 4.40–5.90)
RDW: 13.8 % (ref 11.5–14.5)
WBC: 4.1 10*3/uL (ref 3.8–10.6)

## 2016-09-11 LAB — OSMOLALITY, URINE: OSMOLALITY UR: 264 mosm/kg — AB (ref 300–900)

## 2016-09-11 LAB — COMPREHENSIVE METABOLIC PANEL
ALT: 16 U/L — ABNORMAL LOW (ref 17–63)
ANION GAP: 10 (ref 5–15)
AST: 24 U/L (ref 15–41)
Albumin: 3.9 g/dL (ref 3.5–5.0)
Alkaline Phosphatase: 33 U/L — ABNORMAL LOW (ref 38–126)
BILIRUBIN TOTAL: 0.7 mg/dL (ref 0.3–1.2)
BUN: 11 mg/dL (ref 6–20)
CO2: 22 mmol/L (ref 22–32)
Calcium: 9.2 mg/dL (ref 8.9–10.3)
Chloride: 93 mmol/L — ABNORMAL LOW (ref 101–111)
Creatinine, Ser: 1.25 mg/dL — ABNORMAL HIGH (ref 0.61–1.24)
GFR calc Af Amer: >60 mL/min (ref >60)
Glucose, Bld: 101 mg/dL — ABNORMAL HIGH (ref 65–99)
POTASSIUM: 4.5 mmol/L (ref 3.5–5.1)
Sodium: 125 mmol/L — ABNORMAL LOW (ref 135–145)
TOTAL PROTEIN: 6.6 g/dL (ref 6.5–8.1)

## 2016-09-11 LAB — TSH: TSH: 1.452 u[IU]/mL (ref 0.350–4.500)

## 2016-09-11 LAB — OSMOLALITY: Osmolality: 265 mOsm/kg — ABNORMAL LOW (ref 275–295)

## 2016-09-11 LAB — URINALYSIS COMPLETE WITH MICROSCOPIC (ARMC ONLY)
BACTERIA UA: NONE SEEN
BILIRUBIN URINE: NEGATIVE
Glucose, UA: NEGATIVE mg/dL
HGB URINE DIPSTICK: NEGATIVE
LEUKOCYTES UA: NEGATIVE
NITRITE: NEGATIVE
PH: 7 (ref 5.0–8.0)
Protein, ur: NEGATIVE mg/dL
Specific Gravity, Urine: 1.006 (ref 1.005–1.030)
Squamous Epithelial / LPF: NONE SEEN

## 2016-09-11 LAB — URIC ACID: Uric Acid, Serum: 3.9 mg/dL — ABNORMAL LOW (ref 4.4–7.6)

## 2016-09-11 LAB — LIPASE, BLOOD: LIPASE: 28 U/L (ref 11–51)

## 2016-09-11 LAB — TROPONIN I

## 2016-09-11 MED ORDER — HEPARIN SODIUM (PORCINE) 5000 UNIT/ML IJ SOLN
5000.0000 [IU] | Freq: Three times a day (TID) | INTRAMUSCULAR | Status: DC
  Administered 2016-09-11 – 2016-09-12 (×3): 5000 [IU] via SUBCUTANEOUS
  Filled 2016-09-11 (×4): qty 1

## 2016-09-11 MED ORDER — ONDANSETRON 4 MG PO TBDP: 4.0000 mg | ORAL_TABLET | Freq: Three times a day (TID) | ORAL | Status: DC | PRN

## 2016-09-11 MED ORDER — FAMOTIDINE 20 MG PO TABS
20.0000 mg | ORAL_TABLET | Freq: Two times a day (BID) | ORAL | Status: DC
  Administered 2016-09-11 – 2016-09-15 (×8): 20 mg via ORAL
  Filled 2016-09-11 (×8): qty 1

## 2016-09-11 MED ORDER — ONDANSETRON HCL 4 MG/2ML IJ SOLN
4.0000 mg | Freq: Once | INTRAMUSCULAR | Status: DC
Start: 2016-09-11 — End: 2016-09-15

## 2016-09-11 MED ORDER — PROPRANOLOL HCL 10 MG PO TABS
20.0000 mg | ORAL_TABLET | Freq: Two times a day (BID) | ORAL | Status: DC
  Administered 2016-09-11 – 2016-09-14 (×5): 20 mg via ORAL
  Filled 2016-09-11 (×8): qty 2

## 2016-09-11 MED ORDER — DIVALPROEX SODIUM 500 MG PO DR TAB
750.0000 mg | DELAYED_RELEASE_TABLET | Freq: Two times a day (BID) | ORAL | Status: DC
  Administered 2016-09-11 – 2016-09-15 (×8): 750 mg via ORAL
  Filled 2016-09-11 (×8): qty 1

## 2016-09-11 MED ORDER — TAMSULOSIN HCL 0.4 MG PO CAPS
0.4000 mg | ORAL_CAPSULE | Freq: Every day | ORAL | Status: DC
  Administered 2016-09-11 – 2016-09-15 (×5): 0.4 mg via ORAL
  Filled 2016-09-11 (×4): qty 1

## 2016-09-11 MED ORDER — LISINOPRIL 5 MG PO TABS
5.0000 mg | ORAL_TABLET | Freq: Every day | ORAL | Status: DC
  Administered 2016-09-11 – 2016-09-14 (×4): 5 mg via ORAL
  Filled 2016-09-11 (×4): qty 1

## 2016-09-11 MED ORDER — SODIUM CHLORIDE 0.9 % IV SOLN: Freq: Once | INTRAVENOUS | Status: DC

## 2016-09-11 MED ORDER — CARBAMAZEPINE 200 MG PO TABS
200.0000 mg | ORAL_TABLET | Freq: Every day | ORAL | Status: DC
  Administered 2016-09-12 – 2016-09-14 (×3): 200 mg via ORAL
  Filled 2016-09-11 (×5): qty 1

## 2016-09-11 MED ORDER — CARBAMAZEPINE 200 MG PO TABS
400.0000 mg | ORAL_TABLET | Freq: Two times a day (BID) | ORAL | Status: DC
  Administered 2016-09-11 – 2016-09-15 (×8): 400 mg via ORAL
  Filled 2016-09-11 (×9): qty 2

## 2016-09-11 MED ORDER — SODIUM CHLORIDE 0.9 % IV SOLN
INTRAVENOUS | Status: DC
  Administered 2016-09-11: 1000 mL via INTRAVENOUS
  Administered 2016-09-12: 05:00:00 via INTRAVENOUS

## 2016-09-11 MED ORDER — SODIUM CHLORIDE 0.9 % IV BOLUS (SEPSIS)
1000.0000 mL | Freq: Once | INTRAVENOUS | Status: AC
  Administered 2016-09-11: 1000 mL via INTRAVENOUS

## 2016-09-11 NOTE — ED Provider Notes (Signed)
[facility] Emergency Department Provider Note  Time seen: 11:31 AM  I have reviewed the triage vital signs and the nursing notes.   HISTORY  Chief Complaint Dizziness    HPI [name] is a 55 y.o. male with a past medical history of gastric reflux, hypertension, hyponatremia, CK D, who presents the emergency department for generalized weakness. According to the patient for the past 2-3 days he has been feeling very weak. Describes the weakness as generalized, denies any focal deficits. He also states he has been feeling somewhat nauseated, but denies any vomiting. Patient states it feels somewhat similar to when he was admitted for renal failure. Denies any chest pain. He states he has been having subjective fever at home but has not measured temperature. Denies any cough, congestion, abdominal pain, diarrhea, dysuria.  Past Medical History:  Diagnosis Date  . Anemia   . GERD (gastroesophageal reflux disease)   . Hypertension   . Hyponatremia    chronic  . Kidney disease   . Migraine   . Seizures [facility])     Patient Active Problem List   Diagnosis Date Noted  . Iron deficiency anemia, unspecified   . Benign neoplasm of ascending colon   . Heartburn   . Gastritis   . Altered mental state   . Acute delirium 10/31/2015  . Fall   . Lightheadedness   . Laceration of scalp   . Near syncope 10/27/2015  . Poverty 10/27/2015  . Symptomatic anemia   . Early satiety   . FTT (failure to thrive) in adult   . Protein-calorie malnutrition, severe ([place]) 09/07/2015  . Hyponatremia 09/05/2015  . Elevated troponin 09/05/2015  . Hypokalemia 09/05/2015  . Leukopenia 09/05/2015  . Renal insufficiency 09/05/2015  . Tremor, essential 07/15/2015  . Essential hypertension 07/15/2015  . Chronic renal insufficiency 07/15/2015  . Leg swelling 07/15/2015  . Partial epilepsy with impairment of consciousness ([address]) 05/19/2015  . Chronic kidney disease (CKD), stage  III (moderate) 05/19/2015  . Absolute anemia 05/19/2015  . Chest pain 04/17/2015    Past Surgical History:  Procedure Laterality Date  . COLONOSCOPY WITH PROPOFOL N/A 05/01/2016   Procedure: COLONOSCOPY WITH PROPOFOL;  Surgeon: Lucilla Lame, MD;  Location: [facility] ENDOSCOPY;  Service: Endoscopy;  Laterality: N/A;  . ESOPHAGOGASTRODUODENOSCOPY (EGD) WITH PROPOFOL N/A 05/01/2016   Procedure: ESOPHAGOGASTRODUODENOSCOPY (EGD) WITH PROPOFOL;  Surgeon: Lucilla Lame, MD;  Location: [facility] ENDOSCOPY;  Service: Endoscopy;  Laterality: N/A;  . none      Prior to Admission medications   Medication Sig Start Date End Date Taking? Authorizing Provider  carbamazepine (TEGRETOL XR) 200 MG 12 hr tablet Take 1 tablet (200 mg total) by mouth 2 (two) times daily. 11/18/15   Vaughan Basta, MD  cholecalciferol (VITAMIN D) 1000 units tablet Take 5,000 Units by mouth daily.    Historical Provider, MD  divalproex (DEPAKOTE) 250 MG DR tablet Take 3 tablets (750 mg total) by mouth every 12 (twelve) hours. Patient taking differently: Take 500 mg by mouth every 12 (twelve) hours.  11/18/15   Vaughan Basta, MD  famotidine (PEPCID) 20 MG tablet Take 1 tablet (20 mg total) by mouth 2 (two) times daily. 04/29/16   Carrie Mew, MD  lisinopril (PRINIVIL,ZESTRIL) 20 MG tablet Take by mouth.    Historical Provider, MD  ondansetron (ZOFRAN ODT) 4 MG disintegrating tablet Take 1 tablet (4 mg total) by mouth every 8 (eight) hours as needed for nausea or vomiting. 03/13/16   Carrie Mew, MD  propranolol (INDERAL) 40 MG tablet Take 1 tablet (40 mg total) by mouth 2 (two) times daily. 11/18/15   Vaughan Basta, MD    No Known Allergies  Family History  Problem Relation Age of Onset  . Heart attack Father   . Kidney disease Father     Social History Social History  Substance Use Topics  . Smoking status: Never Smoker  . Smokeless tobacco: Never Used  . Alcohol use No    Review of  Systems Constitutional: Subjective fever at home. Diffuse/generalized weakness. Cardiovascular: Negative for chest pain. Respiratory: Negative for shortness of breath. Gastrointestinal: Negative for abdominal pain. Positive for nausea. Negative for diarrhea. Genitourinary: Negative for dysuria. Musculoskeletal: Negative for back pain. Neurological: Negative for headaches, focal weakness or numbness. 10-point ROS otherwise negative.  ____________________________________________   PHYSICAL EXAM:  VITAL SIGNS: ED Triage Vitals  Enc Vitals Group     BP 09/11/16 1050 (!) 157/91     Pulse Rate 09/11/16 1050 (!) 58     Resp 09/11/16 1050 16     Temp 09/11/16 1050 97.6 F (36.4 C)     Temp Source 09/11/16 1050 Oral     SpO2 09/11/16 1050 98 %     Weight 09/11/16 1052 135 lb (61.2 kg)     Height 09/11/16 1052 5\' 3"  (1.6 m)     Head Circumference --      Peak Flow --      Pain Score 09/11/16 1052 0     Pain Loc --      Pain Edu? --      Excl. in [place]? --     Constitutional: Alert and oriented. Well appearing and in no distress. Eyes: Normal exam ENT   Head: Normocephalic and atraumatic.   Mouth/Throat: Mucous membranes are moist. Cardiovascular: Normal rate, regular rhythm. No murmur Respiratory: Normal respiratory effort without tachypnea nor retractions. Breath sounds are clear Gastrointestinal: Soft and nontender. No distention.   Musculoskeletal: Nontender with normal range of motion in all extremities. Neurologic:  Normal speech and language. No gross focal neurologic deficits Skin:  Skin is warm, dry and intact.  Psychiatric: Mood and affect are normal. Speech and behavior are normal.   ____________________________________________    EKG  EKG reviewed and interpreted by myself shows normal sinus rhythm at 58 bpm, narrow QRS, normal axis, normal intervals, nonspecific ST changes but he does appear to have mild ST elevation in the inferolateral leads, no reciprocal  depressions noted.  Repeat EKG 10:56:44 reviewed and interpreted by myself shows normal sinus rhythm at 57 bpm, narrow QRS, normal axis, normal intervals, once again nonspecific ST changes with mild inferolateral elevation, which appears to be most suggestive of early repolarization. This is largely unchanged from the patient's prior EKG 03/14/16.  ____________________________________________  INITIAL IMPRESSION / ASSESSMENT AND PLAN / ED COURSE  Pertinent labs & imaging results that were available during my care of the patient were reviewed by me and considered in my medical decision making (see chart for details).  The patient presents the emergency department with 2 days of generalized weakness and nausea. Overall the patient appears well on exam. Nontender abdomen. Denies any chest pain. Initial EKG appeared concerning however upon repeat EKG in comparison with prior EKG it appears largely unchanged.  Patient's labs show significant hyponatremia 125. This could very likely be the cause of the patient's generalized weakness. We'll start the patient on IV normal saline infusion, and admit to the hospital for further treatment. ____________________________________________  FINAL CLINICAL IMPRESSION(S) / ED DIAGNOSES  Generalized weakness Hyponatremia   Harvest Dark, MD 09/11/16 1254

## 2016-09-11 NOTE — H&P (Signed)
[facility] [place] NAME: [name]    MR#:  [id number]  DATE OF BIRTH:  Jan 26, 1961  DATE OF ADMISSION:  09/11/2016  PRIMARY CARE PHYSICIAN: [name], MD   REQUESTING/REFERRING PHYSICIAN: [email or web address]  CHIEF COMPLAINT:   Chief Complaint  Patient presents with  . Dizziness    HISTORY OF PRESENT ILLNESS: [name]  is a 55 y.o. male with a known history of Seizures, Hypertension, Essential tremors, Hyponatremia- had a prolonged admission in Nov 2016- for hyponatremia and weight loss- discharged after Normal sodium, Psych and GI eval and multiple radiological work up to r/o malignancy. He followed with GI and had scope- showed polyp and some gastritis. Again today he was weak and had some vision disturbance. In ER noted to have low sodium.  PAST MEDICAL HISTORY:   Past Medical History:  Diagnosis Date  . Anemia   . GERD (gastroesophageal reflux disease)   . Hypertension   . Hyponatremia    chronic  . Kidney disease   . Migraine   . Seizures ([place])     PAST SURGICAL HISTORY: Past Surgical History:  Procedure Laterality Date  . COLONOSCOPY WITH PROPOFOL N/A 05/01/2016   Procedure: COLONOSCOPY WITH PROPOFOL;  Surgeon: Lucilla Lame, MD;  Location: [facility] ENDOSCOPY;  Service: Endoscopy;  Laterality: N/A;  . ESOPHAGOGASTRODUODENOSCOPY (EGD) WITH PROPOFOL N/A 05/01/2016   Procedure: ESOPHAGOGASTRODUODENOSCOPY (EGD) WITH PROPOFOL;  Surgeon: Lucilla Lame, MD;  Location: [facility] ENDOSCOPY;  Service: Endoscopy;  Laterality: N/A;  . none      SOCIAL HISTORY:  Social History  Substance Use Topics  . Smoking status: Never Smoker  . Smokeless tobacco: Never Used  . Alcohol use No    FAMILY HISTORY:  Family History  Problem Relation Age of Onset  . Heart attack Father   . Kidney disease Father     DRUG ALLERGIES: No Known Allergies  REVIEW OF SYSTEMS:   CONSTITUTIONAL: No fever,positive for fatigue or weakness.  EYES: No  blurred or double vision.  EARS, NOSE, AND THROAT: No tinnitus or ear pain.  RESPIRATORY: No cough, shortness of breath, wheezing or hemoptysis.  CARDIOVASCULAR: No chest pain, orthopnea, edema.  GASTROINTESTINAL: No nausea, vomiting, diarrhea or abdominal pain.  GENITOURINARY: No dysuria, hematuria.  ENDOCRINE: No polyuria, nocturia,  HEMATOLOGY: No anemia, easy bruising or bleeding SKIN: No rash or lesion. MUSCULOSKELETAL: No joint pain or arthritis.   NEUROLOGIC: No tingling, numbness, weakness.  PSYCHIATRY: No anxiety or depression.   MEDICATIONS AT HOME:  Prior to Admission medications   Medication Sig Start Date End Date Taking? Authorizing Provider  carbamazepine (TEGRETOL) 200 MG tablet Take 200-400 mg by mouth See admin instructions. Take 2 tablets (400) mg by mouth every morning, Take 1 tablet by mouth at noon, and Take 2 tablets (400 mg) by mouth every night at bedtime.   Yes Historical Provider, MD  divalproex (DEPAKOTE) 250 MG DR tablet Take 3 tablets (750 mg total) by mouth every 12 (twelve) hours. Patient taking differently: Take 750 mg by mouth 2 (two) times daily.  11/18/15  Yes Vaughan Basta, MD  lisinopril (PRINIVIL,ZESTRIL) 5 MG tablet Take 5 mg by mouth at bedtime.   Yes Historical Provider, MD  propranolol (INDERAL) 20 MG tablet Take 20 mg by mouth 2 (two) times daily.   Yes Historical Provider, MD  carbamazepine (TEGRETOL XR) 200 MG 12 hr tablet Take 1 tablet (200 mg total) by mouth 2 (two) times daily. 11/18/15   Vaughan Basta, MD  famotidine (PEPCID) 20 MG tablet Take 1 tablet (20 mg total) by mouth 2 (two) times daily. Patient not taking: Reported on 09/11/2016 04/29/16   Carrie Mew, MD  ondansetron (ZOFRAN ODT) 4 MG disintegrating tablet Take 1 tablet (4 mg total) by mouth every 8 (eight) hours as needed for nausea or vomiting. Patient not taking: Reported on 09/11/2016 03/13/16   Carrie Mew, MD  propranolol (INDERAL) 40 MG tablet Take 1  tablet (40 mg total) by mouth 2 (two) times daily. 11/18/15   Vaughan Basta, MD      PHYSICAL EXAMINATION:   VITAL SIGNS: Blood pressure (!) 157/91, pulse (!) 58, temperature 97.6 F (36.4 C), temperature source Oral, resp. rate 16, height 5\' 3"  (1.6 m), weight 61.2 kg (135 lb), SpO2 98 %.  GENERAL:  55 y.o.-year-old patient lying in the bed with no acute distress.  EYES: Pupils equal, round, reactive to light and accommodation. No scleral icterus. Extraocular muscles intact.  HEENT: Head atraumatic, normocephalic. Oropharynx and nasopharynx clear.  NECK:  Supple, no jugular venous distention. No thyroid enlargement, no tenderness.  LUNGS: Normal breath sounds bilaterally, no wheezing, rales,rhonchi or crepitation. No use of accessory muscles of respiration.  CARDIOVASCULAR: S1, S2 normal. No murmurs, rubs, or gallops.  ABDOMEN: Soft, nontender, nondistended. Bowel sounds present. No organomegaly or mass.  EXTREMITIES: No pedal edema, cyanosis, or clubbing.  NEUROLOGIC: Cranial nerves II through XII are intact. Muscle strength 4/5 in all extremities. Sensation intact. Gait not checked. Involuntary shaking like movements present. PSYCHIATRIC: The patient is alert and oriented x 3.  SKIN: No obvious rash, lesion, or ulcer.   LABORATORY PANEL:   CBC  Recent Labs Lab 09/11/16 1054  WBC 4.1  HGB 11.1*  HCT 31.6*  PLT 156  MCV 98.3  MCH 34.5*  MCHC 35.1  RDW 13.8   ------------------------------------------------------------------------------------------------------------------  Chemistries   Recent Labs Lab 09/11/16 1054  NA 125*  K 4.5  CL 93*  CO2 22  GLUCOSE 101*  BUN 11  CREATININE 1.25*  CALCIUM 9.2  AST 24  ALT 16*  ALKPHOS 33*  BILITOT 0.7   ------------------------------------------------------------------------------------------------------------------ estimated creatinine clearance is 52.5 mL/min (by C-G formula based on SCr of 1.25 mg/dL  (H)). ------------------------------------------------------------------------------------------------------------------ No results for input(s): TSH, T4TOTAL, T3FREE, THYROIDAB in the last 72 hours.  Invalid input(s): FREET3   Coagulation profile No results for input(s): INR, PROTIME in the last 168 hours. ------------------------------------------------------------------------------------------------------------------- No results for input(s): DDIMER in the last 72 hours. -------------------------------------------------------------------------------------------------------------------  Cardiac Enzymes  Recent Labs Lab 09/11/16 1054  TROPONINI <0.03   ------------------------------------------------------------------------------------------------------------------ Invalid input(s): POCBNP  ---------------------------------------------------------------------------------------------------------------  Urinalysis    Component Value Date/Time   COLORURINE YELLOW (A) 04/29/2016 0635   APPEARANCEUR CLEAR (A) 04/29/2016 0635   APPEARANCEUR Clear 07/26/2014 1925   LABSPEC 1.016 04/29/2016 0635   LABSPEC 1.006 07/26/2014 1925   PHURINE 5.0 04/29/2016 0635   GLUCOSEU NEGATIVE 04/29/2016 0635   GLUCOSEU Negative 07/26/2014 1925   HGBUR NEGATIVE 04/29/2016 [place] 04/29/2016 0635   BILIRUBINUR Negative 07/26/2014 1925   [place] NEGATIVE 04/29/2016 0635   PROTEINUR NEGATIVE 04/29/2016 0635   NITRITE NEGATIVE 04/29/2016 0635   LEUKOCYTESUR NEGATIVE 04/29/2016 0635   LEUKOCYTESUR Negative 07/26/2014 1925     RADIOLOGY: No results found.  EKG: Orders placed or performed during the hospital encounter of 09/11/16  . ED EKG  . ED EKG  . ED EKG  . ED EKG  . EKG 12-Lead  . EKG 12-Lead  . EKG 12-Lead  .  EKG 12-Lead    IMPRESSION AND PLAN:  * Hyponatremia   Due to seizure meds   IV NS   Monitor.    Check TSh, get nephrology consult  * Seizures    Cont home meds  * Essential tremers   Cont propranolol.  * Hypertension   Cont home meds.  All the records are reviewed and case discussed with ED provider. Management plans discussed with the patient, family and they are in agreement.  CODE STATUS: Code Status History    Date Active Date Inactive Code Status Order ID Comments User Context   10/27/2015 11:57 AM 11/18/2015  5:32 PM Full Code [id number]  Nicholes Mango, MD Inpatient   10/19/2015  3:46 PM 10/21/2015  9:55 PM Full Code [id number]  Dustin Flock, MD Inpatient   09/06/2015 12:26 AM 09/07/2015  5:54 PM Full Code [id number]  Theodoro Grist, MD Inpatient   04/17/2015  4:04 PM 04/18/2015  7:06 PM Full Code [id number]  Fritzi Mandes, MD ED       TOTAL TIME TAKING CARE OF THIS PATIENT: 50 minutes.    Vaughan Basta M.D on 09/11/2016   Between 7am to 6pm - Pager - [phone]  After 6pm go to [email or web address] - password [email or web address] [facility]  Office  [phone]  CC: Primary care physician; [name], MD   Note: This dictation was prepared with Dragon dictation along with smaller phrase technology. Any transcriptional errors that result from this process are unintentional.

## 2016-09-11 NOTE — Progress Notes (Signed)
[facility]  ROUNDING NOTE   Subjective:  Patient very well known to [place]. Late last year the patient had an admission for hyponatremia. The patient presents now with recurrent hyponatremia. Serum sodium currently 125. Patient reports that the hyponatremia manifested with weakness and instability. Patient denies nausea, vomiting, diarrhea. It appears that the patient's diet has not been optimal however.   Objective:  Vital signs in last 24 hours:  Temp:  [97.6 F (36.4 C)-98.4 F (36.9 C)] 98.4 F (36.9 C) (09/26 1638) Pulse Rate:  [53-68] 68 (09/26 1638) Resp:  [11-18] 16 (09/26 1638) BP: (122-157)/(45-103) 141/45 (09/26 1638) SpO2:  [98 %-100 %] 100 % (09/26 1638) Weight:  [61.2 kg (135 lb)] 61.2 kg (135 lb) (09/26 1052)  Weight change:  Filed Weights   09/11/16 1052  Weight: 61.2 kg (135 lb)    Intake/Output: No intake/output data recorded.   Intake/Output this shift:  Total I/O In: 1000 [IV Piggyback:1000] Out: -   Physical Exam: General: No acute distress  Head: Normocephalic, atraumatic. Moist oral mucosal membranes  Eyes: Anicteric  Neck: Supple, trachea midline  Lungs:  Clear to auscultation, normal effort  Heart: S1S2 no rubs  Abdomen:  Soft, nontender, bowel sounds present  Extremities: no peripheral edema.  Neurologic: Nonfocal, moving all four extremities  Skin: No lesions  Access:     Basic Metabolic Panel:  Recent Labs Lab 09/11/16 1054  NA 125*  K 4.5  CL 93*  CO2 22  GLUCOSE 101*  BUN 11  CREATININE 1.25*  CALCIUM 9.2    Liver Function Tests:  Recent Labs Lab 09/11/16 1054  AST 24  ALT 16*  ALKPHOS 33*  BILITOT 0.7  PROT 6.6  ALBUMIN 3.9    Recent Labs Lab 09/11/16 1054  LIPASE 28   No results for input(s): AMMONIA in the last 168 hours.  CBC:  Recent Labs Lab 09/11/16 1054  WBC 4.1  HGB 11.1*  HCT 31.6*  MCV 98.3  PLT 156    Cardiac Enzymes:  Recent Labs Lab 09/11/16 1054  TROPONINI  <0.03    BNP: Invalid input(s): POCBNP  CBG: No results for input(s): GLUCAP in the last 168 hours.  Microbiology: Results for orders placed or performed during the hospital encounter of 04/29/16  Urine culture     Status: None   Collection Time: 04/29/16  6:35 AM  Result Value Ref Range Status   Specimen Description URINE, RANDOM  Final   Special Requests NONE  Final   Culture NO GROWTH 1 DAY  Final   Report Status 05/01/2016 FINAL  Final    Coagulation Studies: No results for input(s): LABPROT, INR in the last 72 hours.  Urinalysis:  Recent Labs  09/11/16 1533  COLORURINE STRAW*  LABSPEC 1.006  PHURINE 7.0  GLUCOSEU NEGATIVE  HGBUR NEGATIVE  BILIRUBINUR NEGATIVE  KETONESUR TRACE*  PROTEINUR NEGATIVE  NITRITE NEGATIVE  LEUKOCYTESUR NEGATIVE      Imaging: No results found.   Medications:   . sodium chloride 1,000 mL (09/11/16 1718)   . sodium chloride   Intravenous Once  . [START ON 09/12/2016] carbamazepine  200 mg Oral Q1200  . carbamazepine  400 mg Oral BID  . divalproex  750 mg Oral BID  . famotidine  20 mg Oral BID  . heparin  5,000 Units Subcutaneous Q8H  . lisinopril  5 mg Oral QHS  . ondansetron (ZOFRAN) IV  4 mg Intravenous Once  . propranolol  20 mg Oral BID  .  tamsulosin  0.4 mg Oral Daily   ondansetron  Assessment/ Plan:  55 y.o. male with hypertension, seizure disorder, chronic speech disorder, CKD stage II, anemia of CKD, SHPTH  1.  Hyponatremia. 2.  CKD stage II 3.  Hypertension.   Plan:  The patient presents with recurrent hyponatremia.  Patient had similar episode in December 2016 however at that time the patient had severe altered mental status.  It appears that the patient's hyponatremia may now be nutritional. Patient denies polydipsia. There is some question as to whether this could be potentially related to Tegretol as well. Patient reports that he's not had a very good diet over the past several weeks. Agree with IV fluid  hydration up front. TSH, cortisol, and serum uric acid level should also be checked. We will also check serum and urine osmolality as well as urine sodium. Follow serum sodiums for now. Thanks for consultation.   LOS: 0 Mandy Peeks 9/26/20175:39 PM

## 2016-09-11 NOTE — ED Notes (Signed)
Patient was bladder scanned and he has greater than 923mL in his bladder

## 2016-09-11 NOTE — ED Triage Notes (Signed)
Pt here from home via [facility] with c/o dizziness, nausea and weakness. Pt reports nausea started last night, dizziness upon awakening this AM. Pt reports he got up to urinate at 0000 and was not dizzy then. Pt alert and oriented, no facial droop or weakness noted. Pt reports hx of kidney disease, states "I feel as bad as I did when I got diagnosed with that".

## 2016-09-12 LAB — CBC
HEMATOCRIT: 27.4 % — AB (ref 40.0–52.0)
HEMOGLOBIN: 9.6 g/dL — AB (ref 13.0–18.0)
MCH: 34.6 pg — ABNORMAL HIGH (ref 26.0–34.0)
MCHC: 35 g/dL (ref 32.0–36.0)
MCV: 98.7 fL (ref 80.0–100.0)
Platelets: 137 10*3/uL — ABNORMAL LOW (ref 150–440)
RBC: 2.77 MIL/uL — ABNORMAL LOW (ref 4.40–5.90)
RDW: 14 % (ref 11.5–14.5)
WBC: 4.1 10*3/uL (ref 3.8–10.6)

## 2016-09-12 LAB — BASIC METABOLIC PANEL
ANION GAP: 7 (ref 5–15)
BUN: 11 mg/dL (ref 6–20)
CALCIUM: 8.7 mg/dL — AB (ref 8.9–10.3)
CO2: 23 mmol/L (ref 22–32)
Chloride: 99 mmol/L — ABNORMAL LOW (ref 101–111)
Creatinine, Ser: 1.02 mg/dL (ref 0.61–1.24)
Glucose, Bld: 120 mg/dL — ABNORMAL HIGH (ref 65–99)
POTASSIUM: 3.7 mmol/L (ref 3.5–5.1)
SODIUM: 129 mmol/L — AB (ref 135–145)

## 2016-09-12 LAB — URINE CULTURE: Culture: NO GROWTH

## 2016-09-12 LAB — CORTISOL: CORTISOL PLASMA: 17.8 ug/dL

## 2016-09-12 MED ORDER — ENOXAPARIN SODIUM 40 MG/0.4ML ~~LOC~~ SOLN
40.0000 mg | SUBCUTANEOUS | Status: DC
  Administered 2016-09-12 – 2016-09-14 (×3): 40 mg via SUBCUTANEOUS
  Filled 2016-09-12 (×3): qty 0.4

## 2016-09-12 NOTE — Progress Notes (Signed)
[facility]  ROUNDING NOTE   Subjective:  Serum sodium has partially improved. Currently up to 129. Patient continues to appear fairly asymptomatic from this. Serum osmolality was fairly low and urine osmolality was  Very similar to serum osmolality.   Objective:  Vital signs in last 24 hours:  Temp:  [98.2 F (36.8 C)-98.6 F (37 C)] 98.6 F (37 C) (09/27 1301) Pulse Rate:  [58-68] 66 (09/27 1301) Resp:  [11-20] 18 (09/27 1301) BP: (108-141)/(45-77) 110/60 (09/27 1301) SpO2:  [100 %] 100 % (09/27 1301)  Weight change:  Filed Weights   09/11/16 1052  Weight: 61.2 kg (135 lb)    Intake/Output: I/O last 3 completed shifts: In: 2129 [P.O.:240; I.V.:889; IV Piggyback:1000] Out: [id number] [Urine:1650]   Intake/Output this shift:  Total I/O In: 438.8 [P.O.:240; I.V.:198.8] Out: 400 [Urine:400]  Physical Exam: General: No acute distress  Head: Normocephalic, atraumatic. Moist oral mucosal membranes  Eyes: Anicteric  Neck: Supple, trachea midline  Lungs:  Clear to auscultation, normal effort  Heart: S1S2 no rubs  Abdomen:  Soft, nontender, bowel sounds present  Extremities: no peripheral edema.  Neurologic: Nonfocal, moving all four extremities  Skin: No lesions  Access:     Basic Metabolic Panel:  Recent Labs Lab 09/11/16 1054 09/12/16 0524  NA 125* 129*  K 4.5 3.7  CL 93* 99*  CO2 22 23  GLUCOSE 101* 120*  BUN 11 11  CREATININE 1.25* 1.02  CALCIUM 9.2 8.7*    Liver Function Tests:  Recent Labs Lab 09/11/16 1054  AST 24  ALT 16*  ALKPHOS 33*  BILITOT 0.7  PROT 6.6  ALBUMIN 3.9    Recent Labs Lab 09/11/16 1054  LIPASE 28   No results for input(s): AMMONIA in the last 168 hours.  CBC:  Recent Labs Lab 09/11/16 1054 09/12/16 0524  WBC 4.1 4.1  HGB 11.1* 9.6*  HCT 31.6* 27.4*  MCV 98.3 98.7  PLT 156 137*    Cardiac Enzymes:  Recent Labs Lab 09/11/16 1054  TROPONINI <0.03    BNP: Invalid input(s):  POCBNP  CBG: No results for input(s): GLUCAP in the last 168 hours.  Microbiology: Results for orders placed or performed during the hospital encounter of 04/29/16  Urine culture     Status: None   Collection Time: 04/29/16  6:35 AM  Result Value Ref Range Status   Specimen Description URINE, RANDOM  Final   Special Requests NONE  Final   Culture NO GROWTH 1 DAY  Final   Report Status 05/01/2016 FINAL  Final    Coagulation Studies: No results for input(s): LABPROT, INR in the last 72 hours.  Urinalysis:  Recent Labs  09/11/16 1533  COLORURINE STRAW*  LABSPEC 1.006  PHURINE 7.0  GLUCOSEU NEGATIVE  HGBUR NEGATIVE  BILIRUBINUR NEGATIVE  KETONESUR TRACE*  PROTEINUR NEGATIVE  NITRITE NEGATIVE  LEUKOCYTESUR NEGATIVE      Imaging: No results found.   Medications:     . carbamazepine  200 mg Oral Q1200  . carbamazepine  400 mg Oral BID  . divalproex  750 mg Oral BID  . enoxaparin (LOVENOX) injection  40 mg Subcutaneous Q24H  . famotidine  20 mg Oral BID  . lisinopril  5 mg Oral QHS  . ondansetron (ZOFRAN) IV  4 mg Intravenous Once  . propranolol  20 mg Oral BID  . tamsulosin  0.4 mg Oral Daily   ondansetron  Assessment/ Plan:  55 y.o. male with hypertension, seizure disorder, chronic speech disorder,  CKD stage II, anemia of CKD, SHPTH  1.  Hyponatremia.  Likely secondary to poor by mouth intake. -  Continue IV fluid hydration with 0.9 normal saline.  Continue to monitor serum sodium daily.  2.  CKD stage II.  Renal function has improved with IV fluid hydration.  Continue to monitor renal function trend daily while on IV fluids.  3.  Hypertension.  Blood pressure currently 110/60.  Continue lisinopril for hypertension.     LOS: 1 Frazier Balfour 9/27/20172:26 PM

## 2016-09-12 NOTE — Progress Notes (Signed)
[facility] [place] NAME: [name]    MR#:  [id number]  DATE OF BIRTH:  13-Dec-1961  SUBJECTIVE:  CHIEF COMPLAINT:   Chief Complaint  Patient presents with  . Dizziness   Feels better today. Wants to ambulate. Eating better.  REVIEW OF SYSTEMS:    Review of Systems  Constitutional: Positive for malaise/fatigue. Negative for chills and fever.  HENT: Negative for sore throat.   Eyes: Negative for blurred vision, double vision and pain.  Respiratory: Negative for cough, hemoptysis, shortness of breath and wheezing.   Cardiovascular: Negative for chest pain, palpitations, orthopnea and leg swelling.  Gastrointestinal: Negative for abdominal pain, constipation, diarrhea, heartburn, nausea and vomiting.  Genitourinary: Negative for dysuria and hematuria.  Musculoskeletal: Negative for back pain and joint pain.  Skin: Negative for rash.  Neurological: Negative for sensory change, speech change, focal weakness and headaches.  Endo/Heme/Allergies: Does not bruise/bleed easily.  Psychiatric/Behavioral: Negative for depression. The patient is not nervous/anxious.     DRUG ALLERGIES:  No Known Allergies  VITALS:  Blood pressure 110/60, pulse 66, temperature 98.6 F (37 C), temperature source Oral, resp. rate 18, height 5\' 3"  (1.6 m), weight 61.2 kg (135 lb), SpO2 100 %.  PHYSICAL EXAMINATION:   Physical Exam  GENERAL:  55 y.o.-year-old patient lying in the bed with no acute distress.  EYES: Pupils equal, round, reactive to light and accommodation. No scleral icterus. Extraocular muscles intact.  HEENT: Head atraumatic, normocephalic. Oropharynx and nasopharynx clear.  NECK:  Supple, no jugular venous distention. No thyroid enlargement, no tenderness.  LUNGS: Normal breath sounds bilaterally, no wheezing, rales, rhonchi. No use of accessory muscles of respiration.  CARDIOVASCULAR: S1, S2 normal. No murmurs, rubs, or gallops.   ABDOMEN: Soft, nontender, nondistended. Bowel sounds present. No organomegaly or mass.  EXTREMITIES: No cyanosis, clubbing or edema b/l.    NEUROLOGIC: Cranial nerves II through XII are intact. No focal Motor or sensory deficits b/l.   PSYCHIATRIC: The patient is alert and oriented x 3.  SKIN: No obvious rash, lesion, or ulcer.   LABORATORY PANEL:   CBC  Recent Labs Lab 09/12/16 0524  WBC 4.1  HGB 9.6*  HCT 27.4*  PLT 137*   ------------------------------------------------------------------------------------------------------------------ Chemistries   Recent Labs Lab 09/11/16 1054 09/12/16 0524  NA 125* 129*  K 4.5 3.7  CL 93* 99*  CO2 22 23  GLUCOSE 101* 120*  BUN 11 11  CREATININE 1.25* 1.02  CALCIUM 9.2 8.7*  AST 24  --   ALT 16*  --   ALKPHOS 33*  --   BILITOT 0.7  --    ------------------------------------------------------------------------------------------------------------------  Cardiac Enzymes  Recent Labs Lab 09/11/16 1054  TROPONINI <0.03   ------------------------------------------------------------------------------------------------------------------  RADIOLOGY:  No results found.   ASSESSMENT AND PLAN:    * Hyponatremia Likely due to low solute load Poor oral intake Encouraged to eat better. Dietitian consult Discussed with Dr. Holley Raring of nephrology. Monitor 1 more day. Repeat labs in the morning. Likely discharge tomorrow if sodium improving  * Seizures   Cont home meds  * Essential tremers   Cont propranolol.  * Hypertension   Cont home meds.  All the records are reviewed and case discussed with Care Management/Social Workerr. Management plans discussed with the patient, family and they are in agreement.  CODE STATUS: FULL CODE  DVT Prophylaxis: SCDs  TOTAL TIME TAKING CARE OF THIS PATIENT: 30 minutes.   POSSIBLE D/C IN 1-2 DAYS, DEPENDING  ON CLINICAL CONDITION.  Hillary Bow R M.D on 09/12/2016 at 1:31  PM  Between 7am to 6pm - Pager - [phone]  After 6pm go to [email or web address] - password [email or web address] [facility]  Office  [phone]  CC: Primary care physician; [name], MD  Note: This dictation was prepared with Dragon dictation along with smaller phrase technology. Any transcriptional errors that result from this process are unintentional.

## 2016-09-13 LAB — BASIC METABOLIC PANEL
ANION GAP: 6 (ref 5–15)
BUN: 17 mg/dL (ref 6–20)
CALCIUM: 9.1 mg/dL (ref 8.9–10.3)
CHLORIDE: 94 mmol/L — AB (ref 101–111)
CO2: 27 mmol/L (ref 22–32)
Creatinine, Ser: 1.17 mg/dL (ref 0.61–1.24)
GFR calc Af Amer: >60 mL/min (ref >60)
GFR calc non Af Amer: >60 mL/min (ref >60)
GLUCOSE: 79 mg/dL (ref 65–99)
Potassium: 4.2 mmol/L (ref 3.5–5.1)
Sodium: 127 mmol/L — ABNORMAL LOW (ref 135–145)

## 2016-09-13 MED ORDER — ENSURE ENLIVE PO LIQD
237.0000 mL | Freq: Three times a day (TID) | ORAL | Status: DC
  Administered 2016-09-13 – 2016-09-15 (×6): 237 mL via ORAL

## 2016-09-13 MED ORDER — SODIUM CHLORIDE 0.9 % IV SOLN
INTRAVENOUS | Status: DC
  Administered 2016-09-13 – 2016-09-14 (×3): via INTRAVENOUS

## 2016-09-13 NOTE — Progress Notes (Signed)
[facility] [place] NAME: [name]    MR#:  [id number]  DATE OF BIRTH:  03/07/1961  SUBJECTIVE:  CHIEF COMPLAINT:   Chief Complaint  Patient presents with  . Dizziness   Feels better today. Wants to ambulate. Eating better.  Sodium level dropped again, he had more oral liquid yesterday.  REVIEW OF SYSTEMS:    Review of Systems  Constitutional: Positive for malaise/fatigue. Negative for chills and fever.  HENT: Negative for sore throat.   Eyes: Negative for blurred vision, double vision and pain.  Respiratory: Negative for cough, hemoptysis, shortness of breath and wheezing.   Cardiovascular: Negative for chest pain, palpitations, orthopnea and leg swelling.  Gastrointestinal: Negative for abdominal pain, constipation, diarrhea, heartburn, nausea and vomiting.  Genitourinary: Negative for dysuria and hematuria.  Musculoskeletal: Negative for back pain and joint pain.  Skin: Negative for rash.  Neurological: Negative for sensory change, speech change, focal weakness and headaches.  Endo/Heme/Allergies: Does not bruise/bleed easily.  Psychiatric/Behavioral: Negative for depression. The patient is not nervous/anxious.     DRUG ALLERGIES:  No Known Allergies  VITALS:  Blood pressure (!) 116/99, pulse 60, temperature 98.1 F (36.7 C), temperature source Oral, resp. rate (!) 24, height 5\' 3"  (1.6 m), weight 61.2 kg (135 lb), SpO2 100 %.  PHYSICAL EXAMINATION:   Physical Exam  GENERAL:  55 y.o.-year-old patient lying in the bed with no acute distress.  EYES: Pupils equal, round, reactive to light and accommodation. No scleral icterus. Extraocular muscles intact.  HEENT: Head atraumatic, normocephalic. Oropharynx and nasopharynx clear.  NECK:  Supple, no jugular venous distention. No thyroid enlargement, no tenderness.  LUNGS: Normal breath sounds bilaterally, no wheezing, rales, rhonchi. No use of accessory muscles of  respiration.  CARDIOVASCULAR: S1, S2 normal. No murmurs, rubs, or gallops.  ABDOMEN: Soft, nontender, nondistended. Bowel sounds present. No organomegaly or mass.  EXTREMITIES: No cyanosis, clubbing or edema b/l.    NEUROLOGIC: Cranial nerves II through XII are intact. No focal Motor or sensory deficits b/l.   PSYCHIATRIC: The patient is alert and oriented x 3.  SKIN: No obvious rash, lesion, or ulcer.   LABORATORY PANEL:   CBC  Recent Labs Lab 09/12/16 0524  WBC 4.1  HGB 9.6*  HCT 27.4*  PLT 137*   ------------------------------------------------------------------------------------------------------------------ Chemistries   Recent Labs Lab 09/11/16 1054  09/13/16 0501  NA 125*  < > 127*  K 4.5  < > 4.2  CL 93*  < > 94*  CO2 22  < > 27  GLUCOSE 101*  < > 79  BUN 11  < > 17  CREATININE 1.25*  < > 1.17  CALCIUM 9.2  < > 9.1  AST 24  --   --   ALT 16*  --   --   ALKPHOS 33*  --   --   BILITOT 0.7  --   --   < > = values in this interval not displayed. ------------------------------------------------------------------------------------------------------------------  Cardiac Enzymes  Recent Labs Lab 09/11/16 1054  TROPONINI <0.03   ------------------------------------------------------------------------------------------------------------------  RADIOLOGY:  No results found.   ASSESSMENT AND PLAN:    * Hyponatremia  Likely due to low solute load  Poor oral intake  Encouraged to eat better. Dietitian consult  Discussed with Dr. Holley Raring of nephrology. Monitor 1 more day. Repeat labs in the morning.  decreased sodium again due to more liquids orally.  * Seizures   Cont home meds  * Essential  tremers   Cont propranolol.  * Hypertension   Cont home meds.  All the records are reviewed and case discussed with Care Management/Social Workerr. Management plans discussed with the patient, family and they are in agreement.  CODE STATUS: FULL CODE  DVT  Prophylaxis: SCDs  TOTAL TIME TAKING CARE OF THIS PATIENT: 30 minutes.   POSSIBLE D/C IN 1-2 DAYS, DEPENDING ON CLINICAL CONDITION.  Vaughan Basta M.D on 09/13/2016 at 4:24 PM  Between 7am to 6pm - Pager - [phone]  After 6pm go to [email or web address] - password [email or web address] [facility]  Office  [phone]  CC: Primary care physician; [name], MD  Note: This dictation was prepared with Dragon dictation along with smaller phrase technology. Any transcriptional errors that result from this process are unintentional.

## 2016-09-13 NOTE — Progress Notes (Signed)
Initial Nutrition Assessment  DOCUMENTATION CODES:   Not applicable  INTERVENTION:  Ensure Enlive po BID, each supplement provides 350 kcal and 20 grams of protein Encourage PO intake during stay  NUTRITION DIAGNOSIS:   Inadequate oral intake related to poor appetite as evidenced by per patient/family report.  GOAL:   Patient will meet greater than or equal to 90% of their needs  MONITOR:   PO intake, I & O's, Labs, Weight trends, Supplement acceptance  REASON FOR ASSESSMENT:   Consult Poor PO  ASSESSMENT:   [name]  is a 55 y.o. male with a known history of Seizures, Hypertension, Essential tremors, Hyponatremia- had a prolonged admission in Nov 2016- for hyponatremia and weight loss- discharged after Normal sodium, Psych and GI eval and multiple radiological work up to r/o malignancy.  [name] endorses poor PO intake related to poor food access PTA, he said he is getting most food from church food pantries and friends. He is unsure of wt loss, but per chart review he exhibits an 11#/7.5% severe wt loss over 4 months. PO intake during stay has been good thus far, 100% at past 2 meals. He did request ensure. Nutrition-Focused physical exam completed. Findings are no fat depletion, no muscle depletion, and no edema.  Labs and medications reviewed: Na 127 NS @ 114mL/hr   Diet Order:  Diet Heart Room service appropriate? Yes; Fluid consistency: Thin  Skin:  Reviewed, no issues  Last BM:  9/28  Height:   Ht Readings from Last 1 Encounters:  09/11/16 5\' 3"  (1.6 m)    Weight:   Wt Readings from Last 1 Encounters:  09/11/16 135 lb (61.2 kg)    Ideal Body Weight:  56.36 kg  BMI:  Body mass index is 23.91 kg/m.  Estimated Nutritional Needs:   Kcal:  1600-1900 calories  Protein:  61-74 gm  Fluid:  >/= 1.6L  EDUCATION NEEDS:   No education needs identified at this time  Devin Anis. Naiya Corral, MS, RD LDN Inpatient Clinical Dietitian Pager  [phone]

## 2016-09-13 NOTE — Progress Notes (Signed)
[facility]  ROUNDING NOTE   Subjective:  Serum sodium was improving but was actually down to 127 this a.m. Patient reports that he drank a significant amount of water yesterday. Therefore he still is likely having low solute intake given the amount of water that he drank yesterday.   Objective:  Vital signs in last 24 hours:  Temp:  [97.9 F (36.6 C)-98.1 F (36.7 C)] 98.1 F (36.7 C) (09/28 0516) Pulse Rate:  [60-73] 60 (09/28 0516) Resp:  [18-24] 24 (09/28 0516) BP: (105-116)/(64-99) 116/99 (09/28 0516) SpO2:  [100 %] 100 % (09/28 0516)  Weight change:  Filed Weights   09/11/16 1052  Weight: 61.2 kg (135 lb)    Intake/Output: I/O last 3 completed shifts: In: 1807.8 [P.O.:720; I.V.:1087.8] Out: 5800 [Urine:5800]   Intake/Output this shift:  Total I/O In: -  Out: 750 [Urine:750]  Physical Exam: General: No acute distress  Head: Normocephalic, atraumatic. Moist oral mucosal membranes  Eyes: Anicteric  Neck: Supple, trachea midline  Lungs:  Clear to auscultation, normal effort  Heart: S1S2 no rubs  Abdomen:  Soft, nontender, bowel sounds present  Extremities: no peripheral edema.  Neurologic: Nonfocal, moving all four extremities  Skin: No lesions  Access:     Basic Metabolic Panel:  Recent Labs Lab 09/11/16 1054 09/12/16 0524 09/13/16 0501  NA 125* 129* 127*  K 4.5 3.7 4.2  CL 93* 99* 94*  CO2 22 23 27   GLUCOSE 101* 120* 79  BUN 11 11 17   CREATININE 1.25* 1.02 1.17  CALCIUM 9.2 8.7* 9.1    Liver Function Tests:  Recent Labs Lab 09/11/16 1054  AST 24  ALT 16*  ALKPHOS 33*  BILITOT 0.7  PROT 6.6  ALBUMIN 3.9    Recent Labs Lab 09/11/16 1054  LIPASE 28   No results for input(s): AMMONIA in the last 168 hours.  CBC:  Recent Labs Lab 09/11/16 1054 09/12/16 0524  WBC 4.1 4.1  HGB 11.1* 9.6*  HCT 31.6* 27.4*  MCV 98.3 98.7  PLT 156 137*    Cardiac Enzymes:  Recent Labs Lab 09/11/16 1054  TROPONINI <0.03     BNP: Invalid input(s): POCBNP  CBG: No results for input(s): GLUCAP in the last 168 hours.  Microbiology: Results for orders placed or performed during the hospital encounter of 09/11/16  Urine culture     Status: None   Collection Time: 09/11/16  3:33 PM  Result Value Ref Range Status   Specimen Description URINE, CLEAN CATCH  Final   Special Requests NONE  Final   Culture NO GROWTH Performed at [facility]   Final   Report Status 09/12/2016 FINAL  Final    Coagulation Studies: No results for input(s): LABPROT, INR in the last 72 hours.  Urinalysis:  Recent Labs  09/11/16 1533  COLORURINE STRAW*  LABSPEC 1.006  PHURINE 7.0  GLUCOSEU NEGATIVE  HGBUR NEGATIVE  BILIRUBINUR NEGATIVE  KETONESUR TRACE*  PROTEINUR NEGATIVE  NITRITE NEGATIVE  LEUKOCYTESUR NEGATIVE      Imaging: No results found.   Medications:   . sodium chloride 100 mL/hr at 09/13/16 0840   . carbamazepine  200 mg Oral Q1200  . carbamazepine  400 mg Oral BID  . divalproex  750 mg Oral BID  . enoxaparin (LOVENOX) injection  40 mg Subcutaneous Q24H  . famotidine  20 mg Oral BID  . feeding supplement (ENSURE ENLIVE)  237 mL Oral TID BM  . lisinopril  5 mg Oral QHS  .  ondansetron (ZOFRAN) IV  4 mg Intravenous Once  . propranolol  20 mg Oral BID  . tamsulosin  0.4 mg Oral Daily   ondansetron  Assessment/ Plan:  55 y.o. male with hypertension, seizure disorder, chronic speech disorder, CKD stage II, anemia of CKD, SHPTH  1.  Hyponatremia.  Likely secondary to poor by mouth intake and low solute intake as an outpt. Labs not entirley consistent with SIADH.  -  Serum sodium lower today at 127.  It appears that the patient drink significant amounts of water yesterday.  I advised the patient to limit his water intake.  He verbalized understanding.  Continue normal saline infusion in followup serum sodium tomorrow.  2.  CKD stage II.  Creatinine stable at 1.1 with a BUN of 17.  Continue  to monitor renal function.  3.  Hypertension.  Blood pressure 116/99. Continue lisinopril for hypertension.     LOS: 2 Jessel Gettinger 9/28/20172:07 PM

## 2016-09-14 LAB — BASIC METABOLIC PANEL
ANION GAP: 4 — AB (ref 5–15)
BUN: 19 mg/dL (ref 6–20)
CHLORIDE: 99 mmol/L — AB (ref 101–111)
CO2: 26 mmol/L (ref 22–32)
Calcium: 8.7 mg/dL — ABNORMAL LOW (ref 8.9–10.3)
Creatinine, Ser: 1.12 mg/dL (ref 0.61–1.24)
Glucose, Bld: 84 mg/dL (ref 65–99)
POTASSIUM: 4.3 mmol/L (ref 3.5–5.1)
SODIUM: 129 mmol/L — AB (ref 135–145)

## 2016-09-14 MED ORDER — FUROSEMIDE 10 MG/ML IJ SOLN
40.0000 mg | Freq: Once | INTRAMUSCULAR | Status: AC
  Administered 2016-09-14: 40 mg via INTRAVENOUS
  Filled 2016-09-14: qty 4

## 2016-09-14 MED ORDER — SODIUM CHLORIDE 1 G PO TABS
1.0000 g | ORAL_TABLET | Freq: Two times a day (BID) | ORAL | Status: DC
  Administered 2016-09-14 – 2016-09-15 (×3): 1 g via ORAL
  Filled 2016-09-14 (×3): qty 1

## 2016-09-14 NOTE — Progress Notes (Signed)
[facility]  ROUNDING NOTE   Subjective:  Patient seen at bedside. Serum sodium higher today at 129. Good urine output of 3.7 L noted. States he's eating well.  Objective:  Vital signs in last 24 hours:  Temp:  [98.1 F (36.7 C)-98.3 F (36.8 C)] 98.1 F (36.7 C) (09/29 0435) Pulse Rate:  [60-61] 60 (09/29 0435) Resp:  [15-22] 22 (09/29 0435) BP: (116-120)/(65-72) 117/72 (09/29 0435) SpO2:  [100 %] 100 % (09/29 0435)  Weight change:  Filed Weights   09/11/16 1052  Weight: 61.2 kg (135 lb)    Intake/Output: I/O last 3 completed shifts: In: 2349.6 [P.O.:240; I.V.:2109.6] Out: 3750 [Urine:3750]   Intake/Output this shift:  Total I/O In: 386.7 [P.O.:240; I.V.:146.7] Out: -   Physical Exam: General: No acute distress  Head: Normocephalic, atraumatic. Moist oral mucosal membranes  Eyes: Anicteric  Neck: Supple, trachea midline  Lungs:  Clear to auscultation, normal effort  Heart: S1S2 no rubs  Abdomen:  Soft, nontender, bowel sounds present  Extremities: no peripheral edema.  Neurologic: Nonfocal, moving all four extremities  Skin: No lesions  Access:     Basic Metabolic Panel:  Recent Labs Lab 09/11/16 1054 09/12/16 0524 09/13/16 0501 09/14/16 0448  NA 125* 129* 127* 129*  K 4.5 3.7 4.2 4.3  CL 93* 99* 94* 99*  CO2 22 23 27 26   GLUCOSE 101* 120* 79 84  BUN 11 11 17 19   CREATININE 1.25* 1.02 1.17 1.12  CALCIUM 9.2 8.7* 9.1 8.7*    Liver Function Tests:  Recent Labs Lab 09/11/16 1054  AST 24  ALT 16*  ALKPHOS 33*  BILITOT 0.7  PROT 6.6  ALBUMIN 3.9    Recent Labs Lab 09/11/16 1054  LIPASE 28   No results for input(s): AMMONIA in the last 168 hours.  CBC:  Recent Labs Lab 09/11/16 1054 09/12/16 0524  WBC 4.1 4.1  HGB 11.1* 9.6*  HCT 31.6* 27.4*  MCV 98.3 98.7  PLT 156 137*    Cardiac Enzymes:  Recent Labs Lab 09/11/16 1054  TROPONINI <0.03    BNP: Invalid input(s): POCBNP  CBG: No results for  input(s): GLUCAP in the last 168 hours.  Microbiology: Results for orders placed or performed during the hospital encounter of 09/11/16  Urine culture     Status: None   Collection Time: 09/11/16  3:33 PM  Result Value Ref Range Status   Specimen Description URINE, CLEAN CATCH  Final   Special Requests NONE  Final   Culture NO GROWTH Performed at [facility]   Final   Report Status 09/12/2016 FINAL  Final    Coagulation Studies: No results for input(s): LABPROT, INR in the last 72 hours.  Urinalysis:  Recent Labs  09/11/16 1533  COLORURINE STRAW*  LABSPEC 1.006  PHURINE 7.0  GLUCOSEU NEGATIVE  HGBUR NEGATIVE  BILIRUBINUR NEGATIVE  KETONESUR TRACE*  PROTEINUR NEGATIVE  NITRITE NEGATIVE  LEUKOCYTESUR NEGATIVE      Imaging: No results found.   Medications:   . sodium chloride 100 mL/hr at 09/14/16 0249   . carbamazepine  200 mg Oral Q1200  . carbamazepine  400 mg Oral BID  . divalproex  750 mg Oral BID  . enoxaparin (LOVENOX) injection  40 mg Subcutaneous Q24H  . famotidine  20 mg Oral BID  . feeding supplement (ENSURE ENLIVE)  237 mL Oral TID BM  . lisinopril  5 mg Oral QHS  . ondansetron (ZOFRAN) IV  4 mg Intravenous Once  .  propranolol  20 mg Oral BID  . sodium chloride  1 g Oral BID WC  . tamsulosin  0.4 mg Oral Daily   ondansetron  Assessment/ Plan:  55 y.o. male with hypertension, seizure disorder, chronic speech disorder, CKD stage II, anemia of CKD, SHPTH  1.  Hyponatremia.  Likely secondary to poor by mouth intake and low solute intake as an outpt. Labs not entirley consistent with SIADH.  -  Serum sodium slightly higher today at 129.  We will discontinue0.9 normal saline and start the patient on salt tablets 1 g by mouth twice a day.  In addition I will administer Lasix 40 mg IV x1 today and follow serum sodium.  2.  CKD stage II.  Renal function remained stable.  Creatinine currently 1.12.  3.  Hypertension.  Blood pressure stable at  117/72.  Continue lisinopril at its current dosage.     LOS: 3 Tasnim Balentine 9/29/201712:06 PM

## 2016-09-14 NOTE — Progress Notes (Signed)
[facility] [place] NAME: Navraj Sampayo    MR#:  [id number]  DATE OF BIRTH:  [birth year]  SUBJECTIVE:  CHIEF COMPLAINT:   Chief Complaint  Patient presents with  . Dizziness   Feels better today. Wants to ambulate. Eating better. Slight increase in sodium today, still not enough.  REVIEW OF SYSTEMS:    Review of Systems  Constitutional: Positive for malaise/fatigue. Negative for chills and fever.  HENT: Negative for sore throat.   Eyes: Negative for blurred vision, double vision and pain.  Respiratory: Negative for cough, hemoptysis, shortness of breath and wheezing.   Cardiovascular: Negative for chest pain, palpitations, orthopnea and leg swelling.  Gastrointestinal: Negative for abdominal pain, constipation, diarrhea, heartburn, nausea and vomiting.  Genitourinary: Negative for dysuria and hematuria.  Musculoskeletal: Negative for back pain and joint pain.  Skin: Negative for rash.  Neurological: Negative for sensory change, speech change, focal weakness and headaches.  Endo/Heme/Allergies: Does not bruise/bleed easily.  Psychiatric/Behavioral: Negative for depression. The patient is not nervous/anxious.     DRUG ALLERGIES:  No Known Allergies  VITALS:  Blood pressure (!) 116/43, pulse 64, temperature 98.2 F (36.8 C), temperature source Oral, resp. rate 16, height 5\' 3"  (1.6 m), weight 61.2 kg (135 lb), SpO2 100 %.  PHYSICAL EXAMINATION:   Physical Exam  GENERAL:  55 y.o.-year-old patient lying in the bed with no acute distress.  EYES: Pupils equal, round, reactive to light and accommodation. No scleral icterus. Extraocular muscles intact.  HEENT: Head atraumatic, normocephalic. Oropharynx and nasopharynx clear.  NECK:  Supple, no jugular venous distention. No thyroid enlargement, no tenderness.  LUNGS: Normal breath sounds bilaterally, no wheezing, rales, rhonchi. No use of accessory muscles of respiration.   CARDIOVASCULAR: S1, S2 normal. No murmurs, rubs, or gallops.  ABDOMEN: Soft, nontender, nondistended. Bowel sounds present. No organomegaly or mass.  EXTREMITIES: No cyanosis, clubbing or edema b/l.    NEUROLOGIC: Cranial nerves II through XII are intact. No focal Motor or sensory deficits b/l.   PSYCHIATRIC: The patient is alert and oriented x 3.  SKIN: No obvious rash, lesion, or ulcer.   LABORATORY PANEL:   CBC  Recent Labs Lab 09/12/16 0524  WBC 4.1  HGB 9.6*  HCT 27.4*  PLT 137*   ------------------------------------------------------------------------------------------------------------------ Chemistries   Recent Labs Lab 09/11/16 1054  09/14/16 0448  NA 125*  < > 129*  K 4.5  < > 4.3  CL 93*  < > 99*  CO2 22  < > 26  GLUCOSE 101*  < > 84  BUN 11  < > 19  CREATININE 1.25*  < > 1.12  CALCIUM 9.2  < > 8.7*  AST 24  --   --   ALT 16*  --   --   ALKPHOS 33*  --   --   BILITOT 0.7  --   --   < > = values in this interval not displayed. ------------------------------------------------------------------------------------------------------------------  Cardiac Enzymes  Recent Labs Lab 09/11/16 1054  TROPONINI <0.03   ------------------------------------------------------------------------------------------------------------------  RADIOLOGY:  No results found.   ASSESSMENT AND PLAN:    * Hyponatremia  Likely due to low solute load  Poor oral intake  Encouraged to eat better. Dietitian consult  Discussed with Dr. Holley Raring of nephrology. Monitor 1 more day. Repeat labs in the morning.  Dr. Holley Raring suggested to stop IV fluids and give lasix one time dose.  * Seizures   Cont home meds  *  Essential tremers   Cont propranolol.  * Hypertension   Cont home meds.  All the records are reviewed and case discussed with Care Management/Social Workerr. Management plans discussed with the patient, family and they are in agreement.  CODE STATUS: FULL  CODE  DVT Prophylaxis: SCDs  TOTAL TIME TAKING CARE OF THIS PATIENT: 30 minutes.   POSSIBLE D/C IN 1-2 DAYS, DEPENDING ON CLINICAL CONDITION.  Vaughan Basta M.D on 09/14/2016 at 3:13 PM  Between 7am to 6pm - Pager - [phone]  After 6pm go to [email or web address] - password [email or web address] [facility]  Office  [phone]  CC: Primary care physician; [name], MD  Note: This dictation was prepared with Dragon dictation along with smaller phrase technology. Any transcriptional errors that result from this process are unintentional.

## 2016-09-15 LAB — BASIC METABOLIC PANEL
ANION GAP: 5 (ref 5–15)
BUN: 28 mg/dL — AB (ref 6–20)
CO2: 30 mmol/L (ref 22–32)
Calcium: 9.3 mg/dL (ref 8.9–10.3)
Chloride: 95 mmol/L — ABNORMAL LOW (ref 101–111)
Creatinine, Ser: 1.2 mg/dL (ref 0.61–1.24)
Glucose, Bld: 85 mg/dL (ref 65–99)
POTASSIUM: 5 mmol/L (ref 3.5–5.1)
SODIUM: 130 mmol/L — AB (ref 135–145)

## 2016-09-15 MED ORDER — ENSURE ENLIVE PO LIQD: 237.0000 mL | Freq: Three times a day (TID) | ORAL | 12 refills | Status: AC

## 2016-09-15 MED ORDER — TAMSULOSIN HCL 0.4 MG PO CAPS: 0.4000 mg | ORAL_CAPSULE | Freq: Every day | ORAL | 0 refills | Status: DC

## 2016-09-15 MED ORDER — SODIUM CHLORIDE 1 G PO TABS: 1.0000 g | ORAL_TABLET | Freq: Two times a day (BID) | ORAL | 0 refills | Status: AC

## 2016-09-15 NOTE — Progress Notes (Signed)
Patient discharged to home as ordered. Patient instructed to make follow up appointments since offices are closed on the weekend. Discharge prescriptions sent to pharmacy electronically. No acute distress noted. Patient is alert and oriented, vital stable. Patient instructed to take blood pressure at home since patient propanolol was held due to low blood pressure. Patient states that he has a blood pressure machine at home.

## 2016-09-15 NOTE — Discharge Summary (Signed)
[facility] NAME: [name]    MR#:  [id number]  DATE OF BIRTH:  1961/05/04  DATE OF ADMISSION:  09/11/2016 ADMITTING PHYSICIAN: Vaughan Basta, MD  DATE OF DISCHARGE: 09/15/16  PRIMARY CARE PHYSICIAN: [name], MD    ADMISSION DIAGNOSIS:  Hyponatremia [E87.1] General weakness [R53.1]  DISCHARGE DIAGNOSIS:  Principal Problem:   Hyponatremia   SECONDARY DIAGNOSIS:   Past Medical History:  Diagnosis Date  . Anemia   . GERD (gastroesophageal reflux disease)   . Hypertension   . Hyponatremia    chronic  . Kidney disease   . Migraine   . Seizures [facility])     HOSPITAL COURSE:  [name]  is a 55 y.o. male admitted 09/11/2016 with chief complaint Dizziness . Please see H&P performed by Vaughan Basta, MD for further information. Patient presented with dizziness and weakness, found to have low sodium. Nephrology consulted and assisted in sodium management. Up to 130 on day of discharge  DISCHARGE CONDITIONS:   stable  CONSULTS OBTAINED:  Treatment Team:  Munsoor Holley Raring, MD  DRUG ALLERGIES:  No Known Allergies  DISCHARGE MEDICATIONS:   Current Discharge Medication List    START taking these medications   Details  feeding supplement, ENSURE ENLIVE, (ENSURE ENLIVE) LIQD Take 237 mLs by mouth 3 (three) times daily between meals. Qty: 237 mL, Refills: 12    sodium chloride 1 g tablet Take 1 tablet (1 g total) by mouth 2 (two) times daily with a meal. Qty: 60 tablet, Refills: 0    tamsulosin (FLOMAX) 0.4 MG CAPS capsule Take 1 capsule (0.4 mg total) by mouth daily. Qty: 30 capsule, Refills: 0      CONTINUE these medications which have NOT CHANGED   Details  carbamazepine (TEGRETOL) 200 MG tablet Take 200-400 mg by mouth See admin instructions. Take 2 tablets (400) mg by mouth every morning, Take 1 tablet by mouth at noon, and Take 2 tablets (400 mg) by mouth every night at bedtime.     divalproex (DEPAKOTE) 250 MG DR tablet Take 3 tablets (750 mg total) by mouth every 12 (twelve) hours. Qty: 100 tablet, Refills: 1    lisinopril (PRINIVIL,ZESTRIL) 5 MG tablet Take 5 mg by mouth at bedtime.    !! propranolol (INDERAL) 20 MG tablet Take 20 mg by mouth 2 (two) times daily.    carbamazepine (TEGRETOL XR) 200 MG 12 hr tablet Take 1 tablet (200 mg total) by mouth 2 (two) times daily. Qty: 60 tablet, Refills: 0    famotidine (PEPCID) 20 MG tablet Take 1 tablet (20 mg total) by mouth 2 (two) times daily. Qty: 60 tablet, Refills: 0    !! propranolol (INDERAL) 40 MG tablet Take 1 tablet (40 mg total) by mouth 2 (two) times daily. Qty: 60 tablet, Refills: 0     !! - Potential duplicate medications found. Please discuss with provider.    STOP taking these medications     ondansetron (ZOFRAN ODT) 4 MG disintegrating tablet          DISCHARGE INSTRUCTIONS:    DIET:  Regular diet  DISCHARGE CONDITION:  Stable  ACTIVITY:  Activity as tolerated  OXYGEN:  Home Oxygen: No.   Oxygen Delivery: room air  DISCHARGE LOCATION:  home   If you experience worsening of your admission symptoms, develop shortness of breath, life threatening emergency, suicidal or homicidal thoughts you must seek medical attention immediately by calling 911 or calling your MD  immediately  if symptoms less severe.  You Must read complete instructions/literature along with all the possible adverse reactions/side effects for all the Medicines you take and that have been prescribed to you. Take any new Medicines after you have completely understood and accpet all the possible adverse reactions/side effects.   Please note  You were cared for by a hospitalist during your hospital stay. If you have any questions about your discharge medications or the care you received while you were in the hospital after you are discharged, you can call the unit and asked to speak with the hospitalist on call if  the hospitalist that took care of you is not available. Once you are discharged, your primary care physician will handle any further medical issues. Please note that NO REFILLS for any discharge medications will be authorized once you are discharged, as it is imperative that you return to your primary care physician (or establish a relationship with a primary care physician if you do not have one) for your aftercare needs so that they can reassess your need for medications and monitor your lab values.    On the day of Discharge:   VITAL SIGNS:  Blood pressure 100/60, pulse 62, temperature 98.6 F (37 C), temperature source Oral, resp. rate 20, height 5\' 3"  (1.6 m), weight 61.2 kg (135 lb), SpO2 100 %.  I/O:   Intake/Output Summary (Last 24 hours) at 09/15/16 0909 Last data filed at 09/14/16 1900  Gross per 24 hour  Intake              480 ml  Output                0 ml  Net              480 ml    PHYSICAL EXAMINATION:  GENERAL:  55 y.o.-year-old patient lying in the bed with no acute distress. -hard of hearing EYES: Pupils equal, round, reactive to light and accommodation. No scleral icterus. Extraocular muscles intact.  HEENT: Head atraumatic, normocephalic. Oropharynx and nasopharynx clear.  NECK:  Supple, no jugular venous distention. No thyroid enlargement, no tenderness.  LUNGS: Normal breath sounds bilaterally, no wheezing, rales,rhonchi or crepitation. No use of accessory muscles of respiration.  CARDIOVASCULAR: S1, S2 normal. No murmurs, rubs, or gallops.  ABDOMEN: Soft, non-tender, non-distended. Bowel sounds present. No organomegaly or mass.  EXTREMITIES: No pedal edema, cyanosis, or clubbing.  NEUROLOGIC: Cranial nerves II through XII are intact. Muscle strength 5/5 in all extremities. Sensation intact. Gait not checked.  PSYCHIATRIC: The patient is alert and oriented x 3.  SKIN: No obvious rash, lesion, or ulcer.   DATA REVIEW:   CBC  Recent Labs Lab 09/12/16 0524    WBC 4.1  HGB 9.6*  HCT 27.4*  PLT 137*    Chemistries   Recent Labs Lab 09/11/16 1054  09/15/16 0421  NA 125*  < > 130*  K 4.5  < > 5.0  CL 93*  < > 95*  CO2 22  < > 30  GLUCOSE 101*  < > 85  BUN 11  < > 28*  CREATININE 1.25*  < > 1.20  CALCIUM 9.2  < > 9.3  AST 24  --   --   ALT 16*  --   --   ALKPHOS 33*  --   --   BILITOT 0.7  --   --   < > = values in this interval not displayed.  Cardiac Enzymes  Recent Labs Lab 09/11/16 1054  [place] <0.03    Microbiology Results  Results for orders placed or performed during the hospital encounter of 09/11/16  Urine culture     Status: None   Collection Time: 09/11/16  3:33 PM  Result Value Ref Range Status   Specimen Description URINE, CLEAN CATCH  Final   Special Requests NONE  Final   Culture NO GROWTH Performed at [facility]   Final   Report Status 09/12/2016 FINAL  Final    RADIOLOGY:  No results found.   Management plans discussed with the patient, family and they are in agreement.  CODE STATUS:     Code Status Orders        Start     Ordered   09/11/16 1640  Full code  Continuous     09/11/16 1639    Code Status History    Date Active Date Inactive Code Status Order ID Comments User Context   10/27/2015 11:57 AM 11/18/2015  5:32 PM Full Code [id number]  Nicholes Mango, MD Inpatient   10/19/2015  3:46 PM 10/21/2015  9:55 PM Full Code [id number]  Dustin Flock, MD Inpatient   09/06/2015 12:26 AM 09/07/2015  5:54 PM Full Code [id number]  Theodoro Grist, MD Inpatient   04/17/2015  4:04 PM 04/18/2015  7:06 PM Full Code [id number]  Fritzi Mandes, MD ED      TOTAL TIME TAKING CARE OF THIS PATIENT: 33 minutes.    Cher Franzoni,  Karenann Cai.D on 09/15/2016 at 9:09 AM  Between 7am to 6pm - Pager - [phone]  After 6pm go to [email or web address] - Proofreader  [facility]  Office  [phone]  CC: Primary care physician; [name], MD

## 2016-12-16 ENCOUNTER — Emergency Department
Admission: EM | Admit: 2016-12-16 | Discharge: 2016-12-16 | Disposition: A | Discharge status: Home / Self Care | Payer: Medicaid Other | Attending: Emergency Medicine | Admitting: Emergency Medicine

## 2016-12-16 DIAGNOSIS — I129 Hypertensive chronic kidney disease with stage 1 through stage 4 chronic kidney disease, or unspecified chronic kidney disease: Secondary | ICD-10-CM | POA: Insufficient documentation

## 2016-12-16 DIAGNOSIS — J069 Acute upper respiratory infection, unspecified: Secondary | ICD-10-CM | POA: Diagnosis not present

## 2016-12-16 DIAGNOSIS — Z79899 Other long term (current) drug therapy: Secondary | ICD-10-CM | POA: Diagnosis not present

## 2016-12-16 DIAGNOSIS — J029 Acute pharyngitis, unspecified: Secondary | ICD-10-CM | POA: Diagnosis present

## 2016-12-16 DIAGNOSIS — N183 Chronic kidney disease, stage 3 (moderate): Secondary | ICD-10-CM | POA: Insufficient documentation

## 2016-12-16 MED ORDER — AZITHROMYCIN 250 MG PO TABS: ORAL_TABLET | ORAL | 0 refills | Status: DC

## 2016-12-16 MED ORDER — CHLORPHENIRAMINE MALEATE 4 MG PO TABS: 4.0000 mg | ORAL_TABLET | Freq: Two times a day (BID) | ORAL | 0 refills | Status: DC | PRN

## 2016-12-16 MED ORDER — CARBAMIDE PEROXIDE 6.5 % OT SOLN: 5.0000 [drp] | Freq: Two times a day (BID) | OTIC | 2 refills | Status: AC

## 2016-12-16 NOTE — ED Triage Notes (Addendum)
Pt states that symptoms started 2-3 days ago, chills started this am, states not meds prior to arrival, states difficulty hearing. Pt states the cough drops have helped open him up some

## 2016-12-16 NOTE — ED Provider Notes (Signed)
[facility] Emergency Department Provider Note   ____________________________________________   None    (approximate)  I have reviewed the triage vital signs and the nursing notes.   HISTORY  Chief Complaint Sore Throat    HPI [name] is a 55 y.o. male presents for evaluation of runny nose congestion decreased hearing and sore throat times several days. Patient states progressively getting worse. He denies any fever chills or cough.   Past Medical History:  Diagnosis Date  . Anemia   . GERD (gastroesophageal reflux disease)   . Hypertension   . Hyponatremia    chronic  . Kidney disease   . Migraine   . Seizures [facility])     Patient Active Problem List   Diagnosis Date Noted  . Iron deficiency anemia, unspecified   . Benign neoplasm of ascending colon   . Heartburn   . Gastritis   . Altered mental state   . Acute delirium 10/31/2015  . Fall   . Lightheadedness   . Laceration of scalp   . Near syncope 10/27/2015  . Poverty 10/27/2015  . Symptomatic anemia   . Early satiety   . FTT (failure to thrive) in adult   . Protein-calorie malnutrition, severe ([place]) 09/07/2015  . Hyponatremia 09/05/2015  . Elevated troponin 09/05/2015  . Hypokalemia 09/05/2015  . Leukopenia 09/05/2015  . Renal insufficiency 09/05/2015  . Tremor, essential 07/15/2015  . Essential hypertension 07/15/2015  . Chronic renal insufficiency 07/15/2015  . Leg swelling 07/15/2015  . Partial epilepsy with impairment of consciousness ([place]) 05/19/2015  . Chronic kidney disease (CKD), stage III (moderate) 05/19/2015  . Absolute anemia 05/19/2015  . Chest pain 04/17/2015    Past Surgical History:  Procedure Laterality Date  . COLONOSCOPY WITH PROPOFOL N/A 05/01/2016   Procedure: COLONOSCOPY WITH PROPOFOL;  Surgeon: Lucilla Lame, MD;  Location: [facility] ENDOSCOPY;  Service: Endoscopy;  Laterality: N/A;  . ESOPHAGOGASTRODUODENOSCOPY (EGD) WITH PROPOFOL N/A 05/01/2016     Procedure: ESOPHAGOGASTRODUODENOSCOPY (EGD) WITH PROPOFOL;  Surgeon: Lucilla Lame, MD;  Location: [facility] ENDOSCOPY;  Service: Endoscopy;  Laterality: N/A;  . none      Prior to Admission medications   Medication Sig Start Date End Date Taking? Authorizing Provider  azithromycin (ZITHROMAX Z-PAK) 250 MG tablet Take 2 tablets (500 mg) on  Day 1,  followed by 1 tablet (250 mg) once daily on Days 2 through 5. 12/16/16   Arlyss Repress, PA-C  carbamazepine (TEGRETOL XR) 200 MG 12 hr tablet Take 1 tablet (200 mg total) by mouth 2 (two) times daily. 11/18/15   Vaughan Basta, MD  carbamazepine (TEGRETOL) 200 MG tablet Take 200-400 mg by mouth See admin instructions. Take 2 tablets (400) mg by mouth every morning, Take 1 tablet by mouth at noon, and Take 2 tablets (400 mg) by mouth every night at bedtime.    Historical Provider, MD  carbamide peroxide (DEBROX) 6.5 % otic solution Place 5 drops into the left ear 2 (two) times daily. 12/16/16 12/16/17  Pierce Crane Cordera Stineman, PA-C  chlorpheniramine (CHLOR-TRIMETON) 4 MG tablet Take 1 tablet (4 mg total) by mouth 2 (two) times daily as needed for allergies or rhinitis. 12/16/16   Pierce Crane Jaylina Ramdass, PA-C  divalproex (DEPAKOTE) 250 MG DR tablet Take 3 tablets (750 mg total) by mouth every 12 (twelve) hours. Patient taking differently: Take 750 mg by mouth 2 (two) times daily.  11/18/15   Vaughan Basta, MD  famotidine (PEPCID) 20 MG tablet Take 1 tablet (20  mg total) by mouth 2 (two) times daily. Patient not taking: Reported on 09/11/2016 04/29/16   Carrie Mew, MD  feeding supplement, ENSURE ENLIVE, (ENSURE ENLIVE) LIQD Take 237 mLs by mouth 3 (three) times daily between meals. 09/15/16   Lytle Butte, MD  lisinopril (PRINIVIL,ZESTRIL) 5 MG tablet Take 5 mg by mouth at bedtime.    Historical Provider, MD  propranolol (INDERAL) 20 MG tablet Take 20 mg by mouth 2 (two) times daily.    Historical Provider, MD  propranolol (INDERAL) 40 MG tablet Take 1  tablet (40 mg total) by mouth 2 (two) times daily. 11/18/15   Vaughan Basta, MD  tamsulosin (FLOMAX) 0.4 MG CAPS capsule Take 1 capsule (0.4 mg total) by mouth daily. 09/16/16   Lytle Butte, MD    Allergies Nsaids  Family History  Problem Relation Age of Onset  . Heart attack Father   . Kidney disease Father     Social History Social History  Substance Use Topics  . Smoking status: Never Smoker  . Smokeless tobacco: Never Used  . Alcohol use No    Review of Systems Constitutional: No fever/chills Eyes: No visual changes. ENT: Positive sore throat. Positive for decreased hearing bilaterally. Cardiovascular: Denies chest pain. Respiratory: Denies shortness of breath. Gastrointestinal: No abdominal pain.  No nausea, no vomiting.  No diarrhea.  No constipation. Genitourinary: Negative for dysuria. Musculoskeletal: Negative for back pain. Skin: Negative for rash. Neurological: Negative for headaches, focal weakness or numbness.  10-point ROS otherwise negative.  ____________________________________________   PHYSICAL EXAM:  VITAL SIGNS: ED Triage Vitals [12/16/16 1652]  Enc Vitals Group     BP (!) 141/71     Pulse Rate (!) 57     Resp 18     Temp 97.9 F (36.6 C)     Temp Source Oral     SpO2 100 %     Weight 148 lb (67.1 kg)     Height 5\' 3"  (1.6 m)     Head Circumference      Peak Flow      Pain Score      Pain Loc      Pain Edu?      Excl. in [place]?     Constitutional: Alert and oriented. Well appearing and in no acute distress. Eyes: Conjunctivae are normal. PERRL. EOMI. Head: Atraumatic.Cerumen impaction bilaterally. Nose: No congestion/rhinnorhea. Mouth/Throat: Mucous membranes are moist.  Oropharynx non-erythematous. Neck: No stridor. No adenopathy noted.  Cardiovascular: Normal rate, regular rhythm. Grossly normal heart sounds.  Good peripheral circulation. Respiratory: Normal respiratory effort.  No retractions. Lungs CTAB. Musculoskeletal:  No lower extremity tenderness nor edema.  No joint effusions. Neurologic:  Normal speech and language. No gross focal neurologic deficits are appreciated.  Skin:  Skin is warm, dry and intact. No rash noted. Psychiatric: Mood and affect are normal. Speech and behavior are normal.  ____________________________________________   LABS (all labs ordered are listed, but only abnormal results are displayed)  Labs Reviewed - No data to display ____________________________________________  EKG   ____________________________________________  RADIOLOGY   ____________________________________________   PROCEDURES  Procedure(s) performed: None  Procedures  Critical Care performed: No  ____________________________________________   INITIAL IMPRESSION / ASSESSMENT AND PLAN / ED COURSE  Pertinent labs & imaging results that were available during my care of the patient were reviewed by me and considered in my medical decision making (see chart for details).  Acute upper respiratory infection with cerumen impaction. Rx given for Zithromax, chlorpheniramine.  Rx given for debox to use twice a day. Follow-up with PCP or return to ER with any worsening symptomology  Clinical Course      ____________________________________________   FINAL CLINICAL IMPRESSION(S) / ED DIAGNOSES  Final diagnoses:  Upper respiratory tract infection, unspecified type      NEW MEDICATIONS STARTED DURING THIS VISIT:  New Prescriptions   AZITHROMYCIN (ZITHROMAX Z-PAK) 250 MG TABLET    Take 2 tablets (500 mg) on  Day 1,  followed by 1 tablet (250 mg) once daily on Days 2 through 5.   CARBAMIDE PEROXIDE (DEBROX) 6.5 % OTIC SOLUTION    Place 5 drops into the left ear 2 (two) times daily.   CHLORPHENIRAMINE (CHLOR-TRIMETON) 4 MG TABLET    Take 1 tablet (4 mg total) by mouth 2 (two) times daily as needed for allergies or rhinitis.     Note:  This document was prepared using Dragon voice recognition  software and may include unintentional dictation errors.   Arlyss Repress, PA-C 12/16/16 [place], MD 12/16/16 [phone]

## 2017-06-22 IMAGING — CT CT HEAD W/O CM
1 series · 15 of 30 positions shown, 19 images · non-contrast
Comparison: 07/18/2010.

CLINICAL DATA: 84-year-old with acute onset of generalized
weakness, lightheadedness and dizziness earlier today. Current
history of epilepsy.

EXAM:
CT HEAD WITHOUT CONTRAST
TECHNIQUE: Contiguous axial images were obtained from the base of the skull
through the vertex without intravenous contrast.

[Series 3: head wo · axial · 0.45mm/px · z∈[-180,-18]mm · 15 of 60 slices shown, 19 images]
[im 3/60  brain]
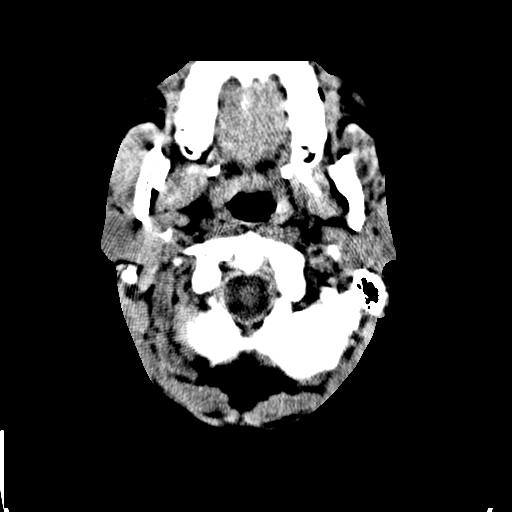
[im 3/60  bone]
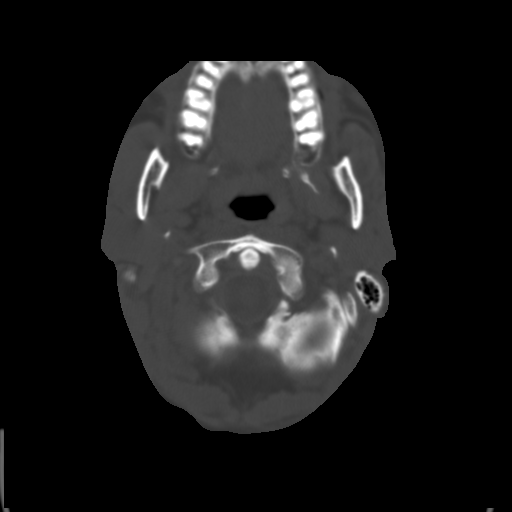
[im 7/60  brain]
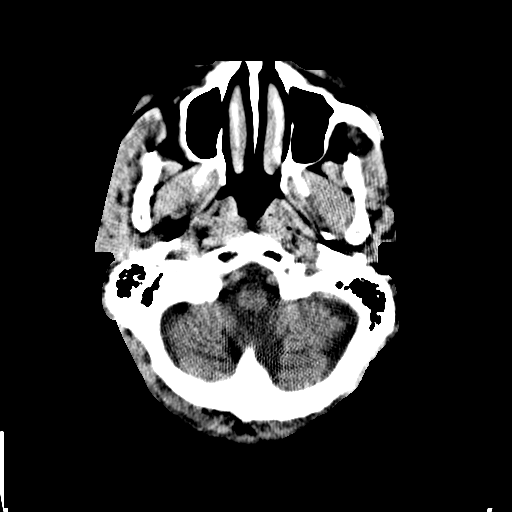
[im 11/60  brain]
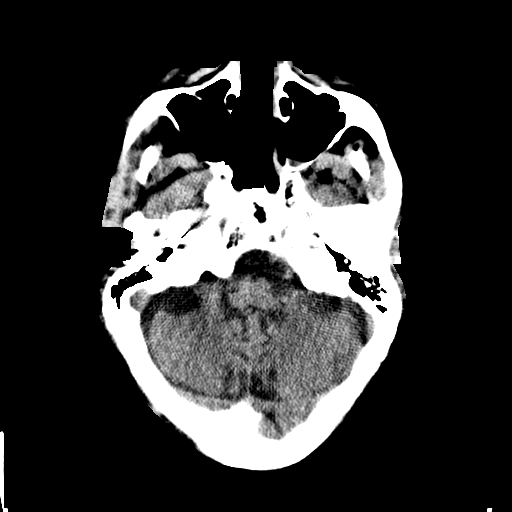
[im 15/60  brain]
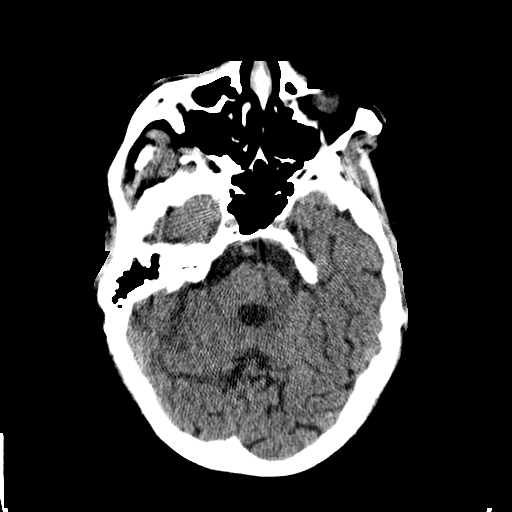
[im 19/60  brain]
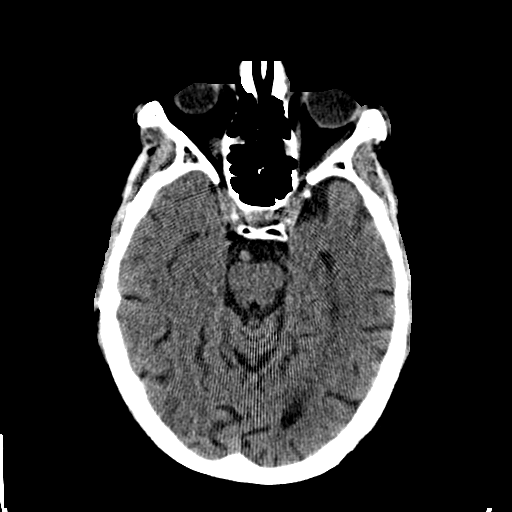
[im 19/60  bone]
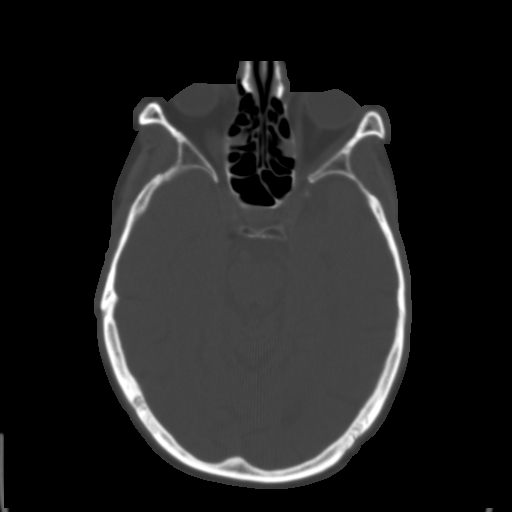
[im 23/60  brain]
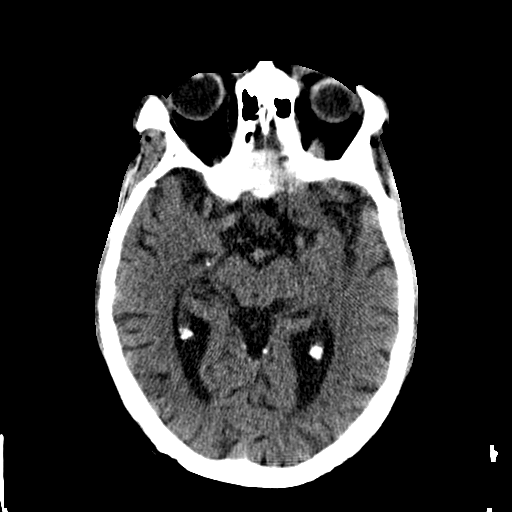
[im 27/60  brain]
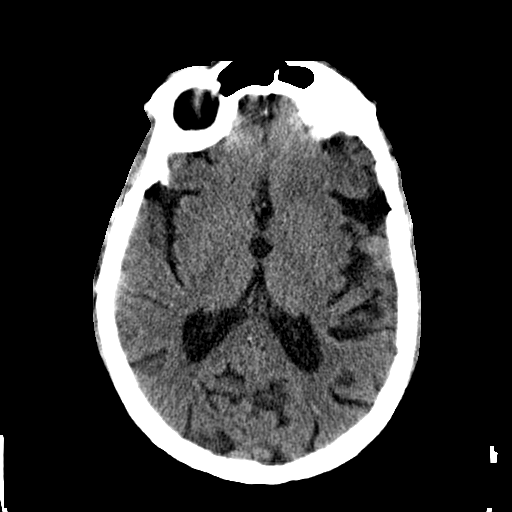
[im 31/60  brain]
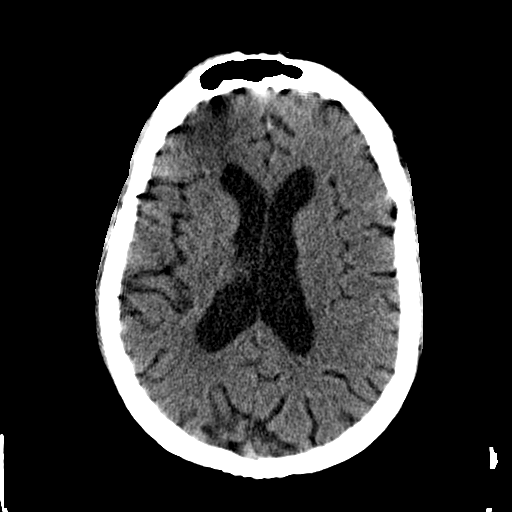
[im 33/60  brain]
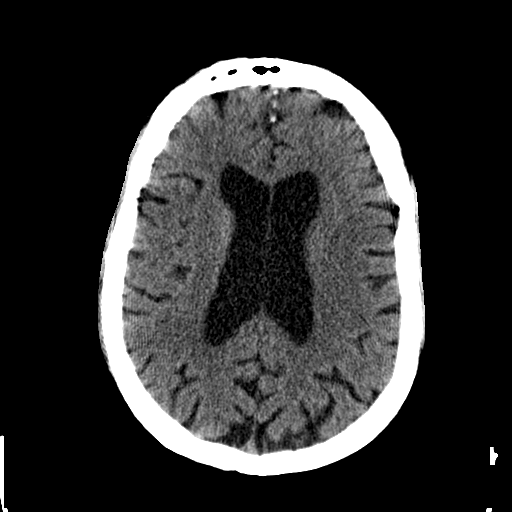
[im 33/60  bone]
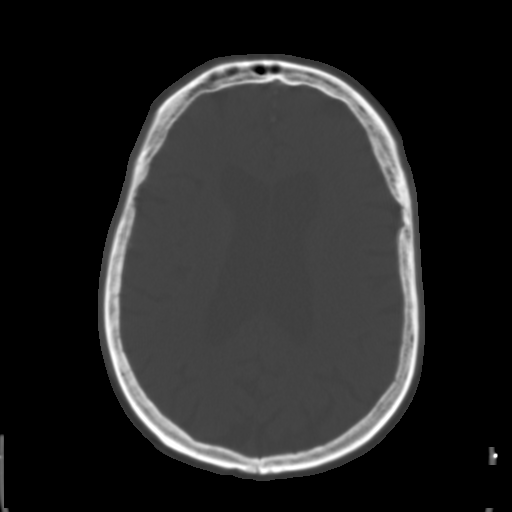
[im 37/60  brain]
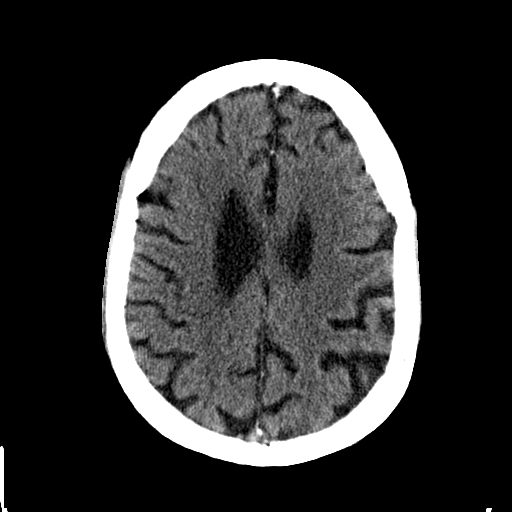
[im 41/60  brain]
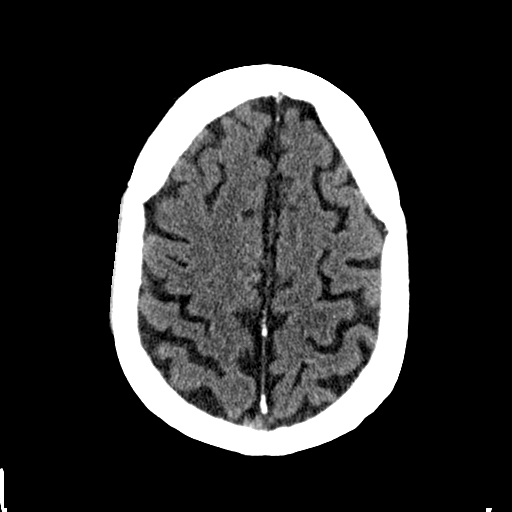
[im 45/60  brain]
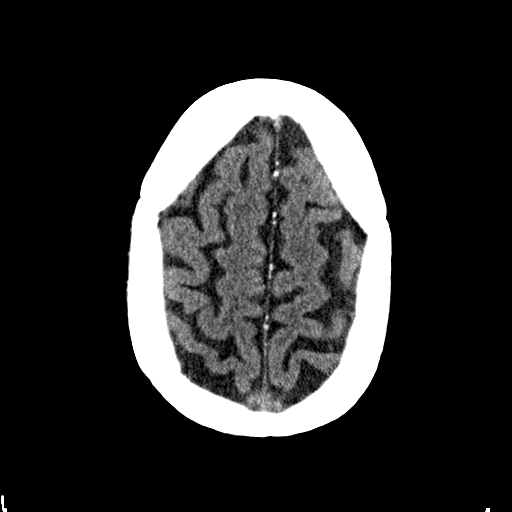
[im 49/60  brain]
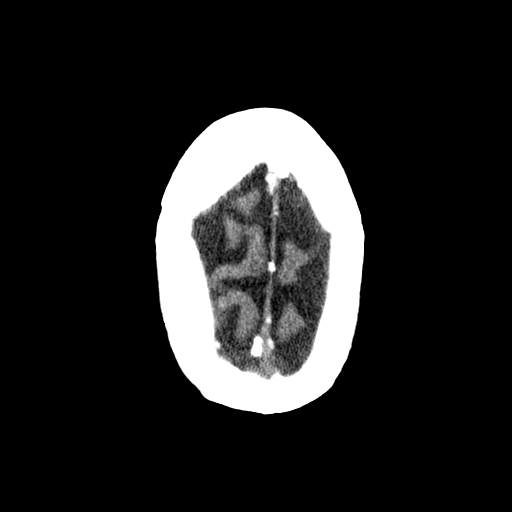
[im 49/60  bone]
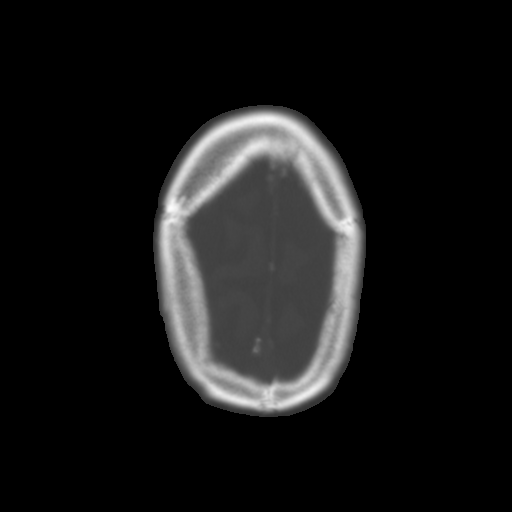
[im 53/60  brain]
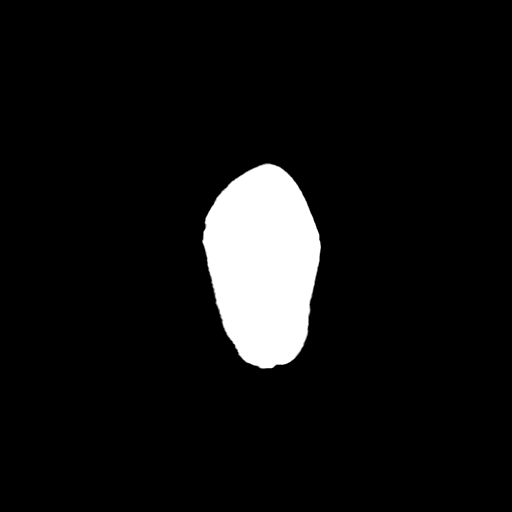
[im 57/60  brain]
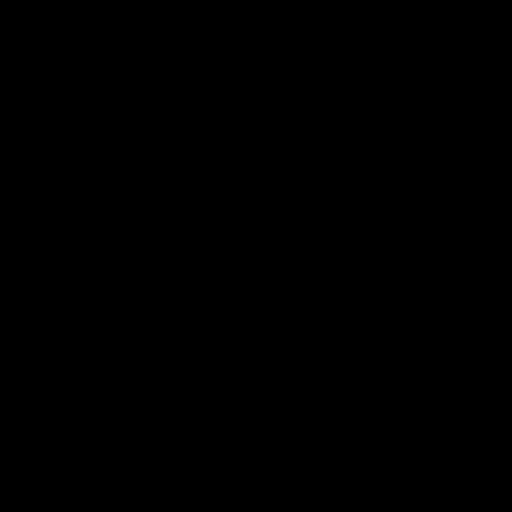

[15 of 30 positions shown; findings below may reference images not displayed]

FINDINGS: Artifact causes a generalized green this of the images, particularly
on the posterior fossa or the artifact is accentuated by beam
hardening artifact from the patient's dental work.

Moderate cortical, deep and cerebellar atrophy, unchanged. No mass
lesion. No midline shift. No acute hemorrhage or hematoma. No
extra-axial fluid collections. No evidence of acute infarction.

Small mucous retention cyst or polyp in the right maxillary sinus.
Small air-fluid level in the left maxillary sinus. Opacification of
scattered bilateral middle ethmoid air cells. Bilateral paranasal
sinuses in their cavities well aerated.
IMPRESSION: 1. No acute intracranial abnormality.
2. Moderate generalized atrophy.
3. Mild chronic right frontal sinus disease. Possible acute mild
left maxillary sinus disease.

## 2017-06-22 IMAGING — CR DG CHEST 2V
2 series · 2 of 2 positions shown · non-contrast
Comparison: 01/28/2015

CLINICAL DATA: Lightheaded dizzy shortness breath chest pain

EXAM:
CHEST  2 VIEW

[chest lat]
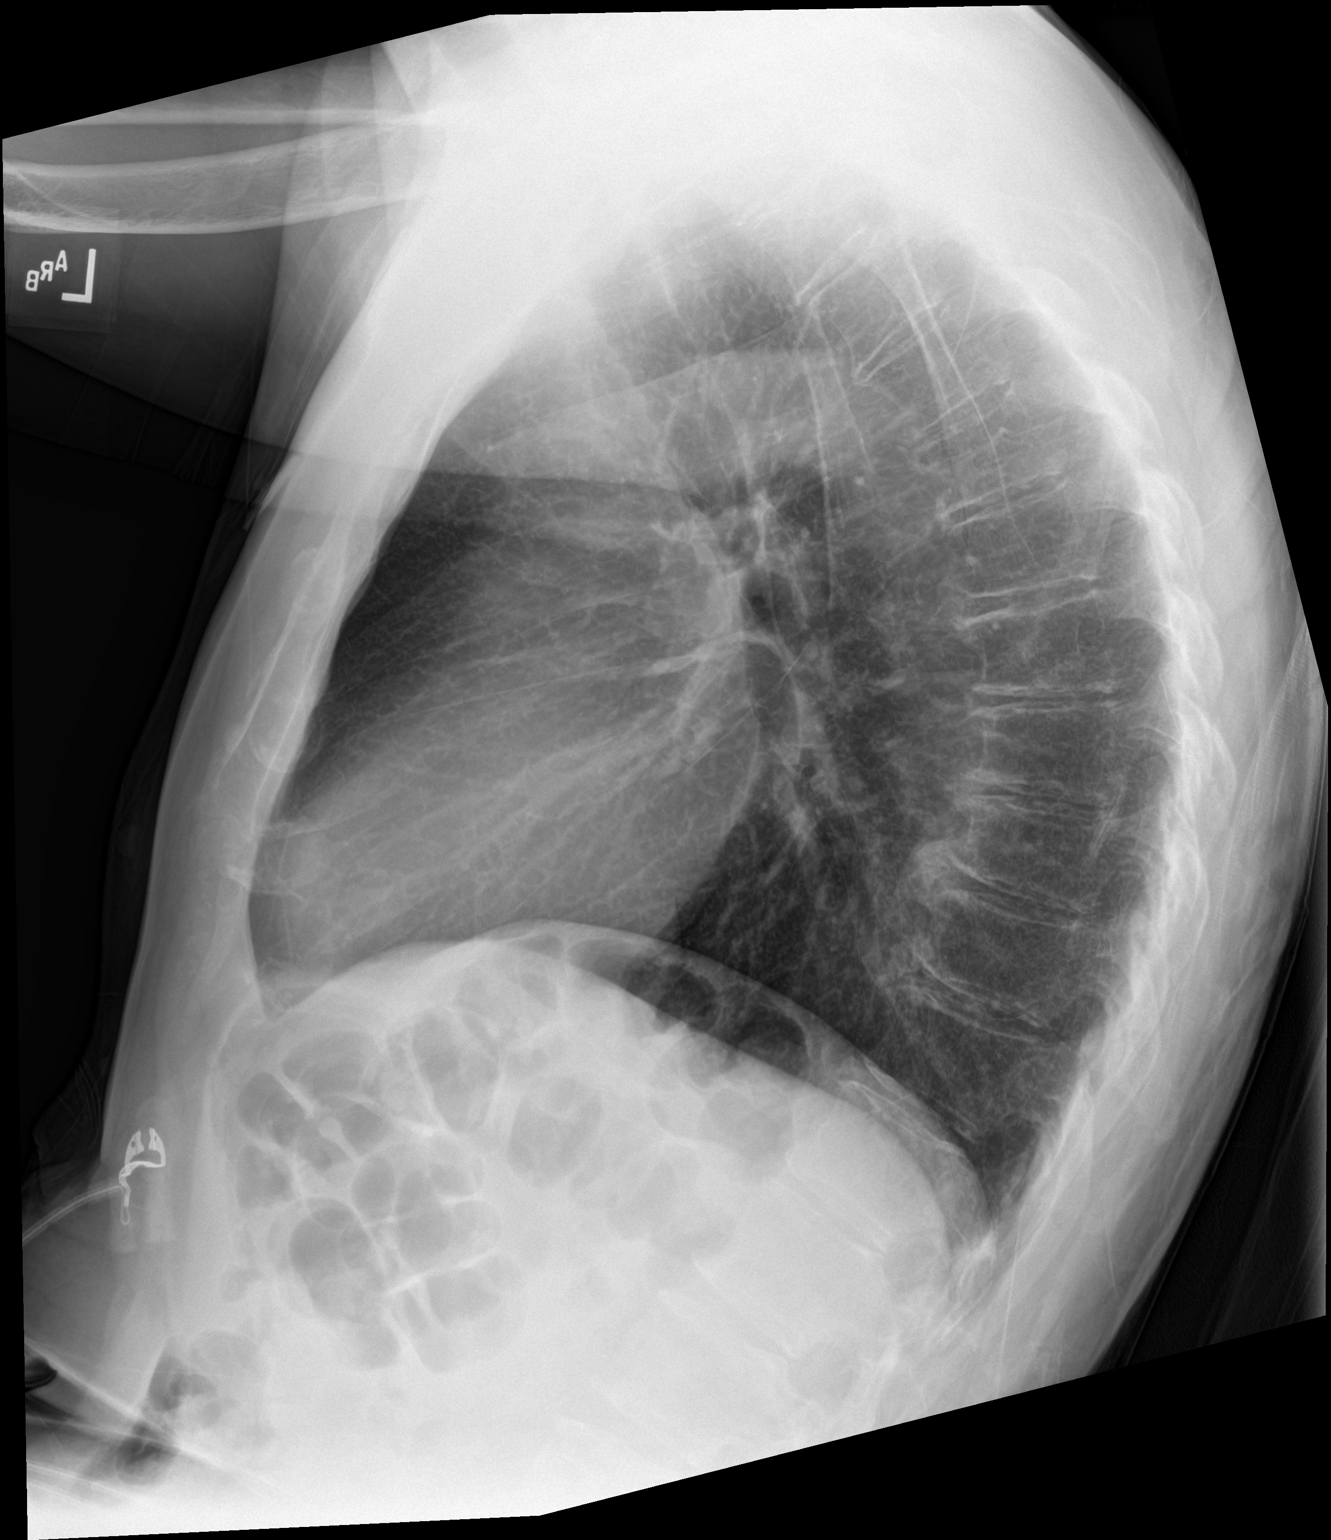

[chest ap]
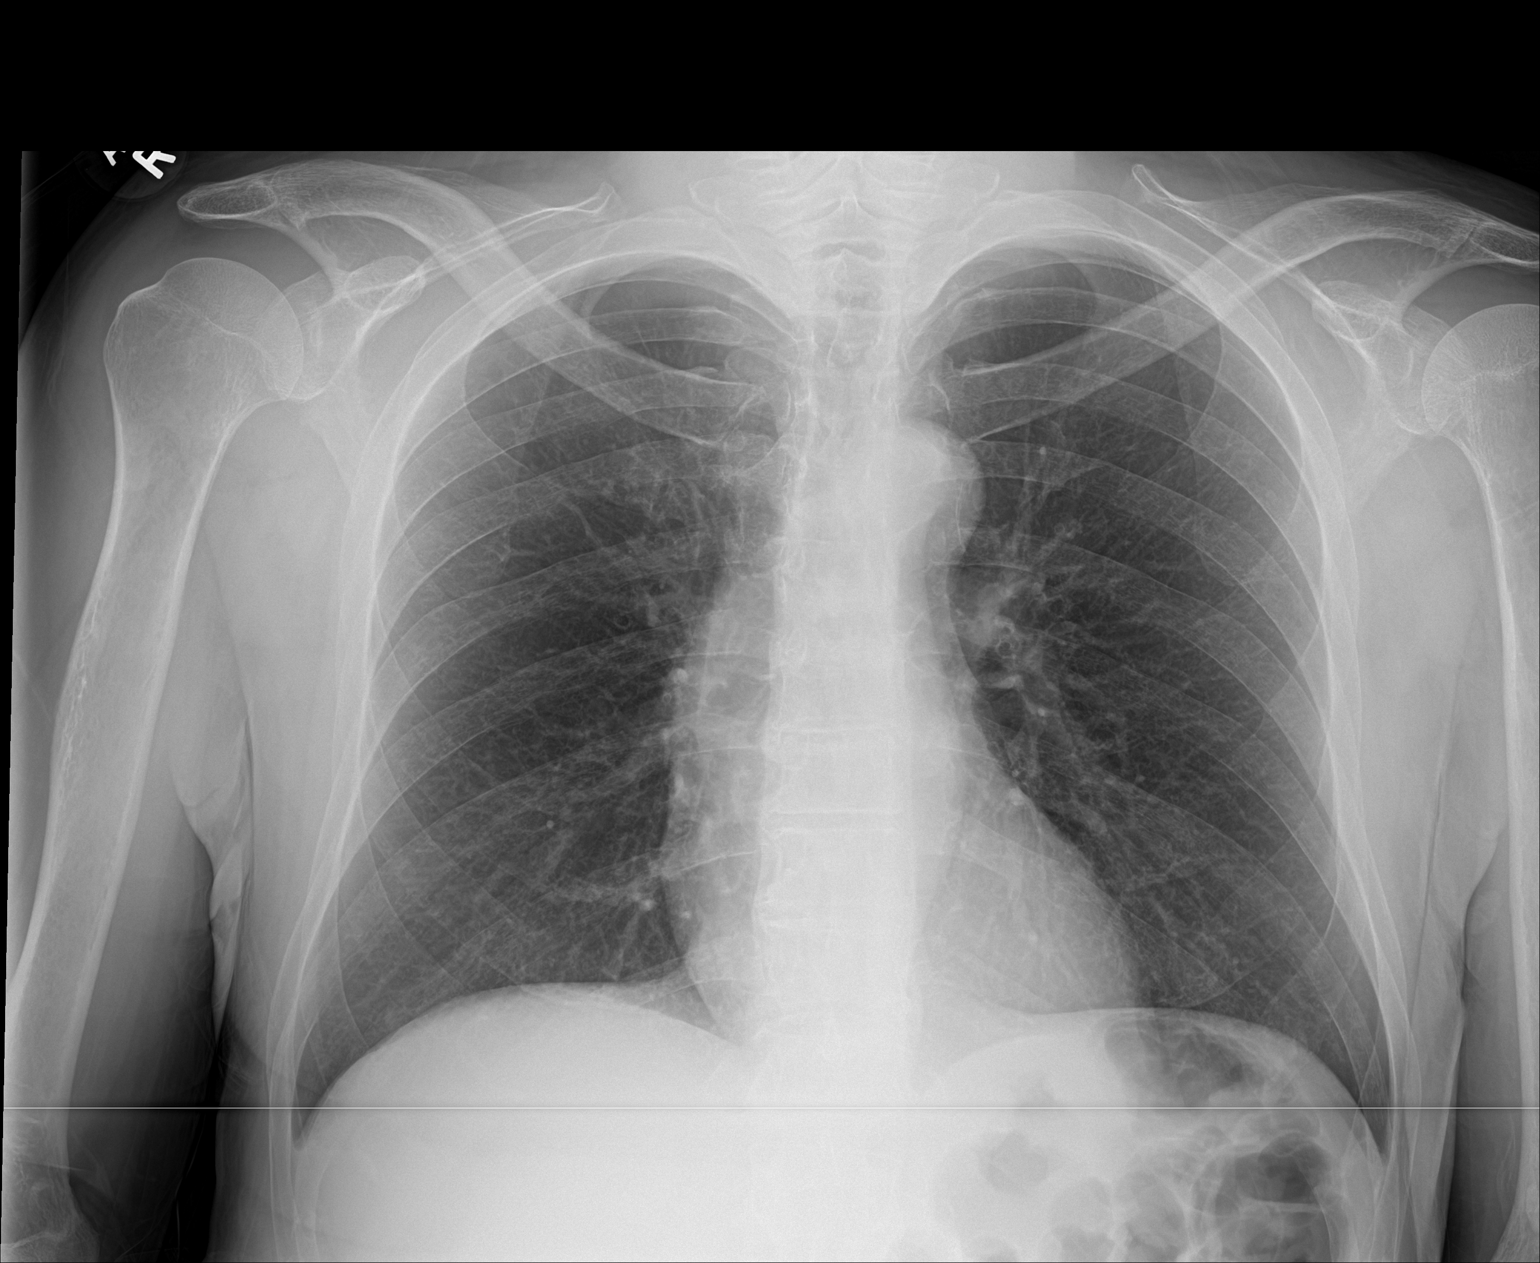

[2 of 2 positions shown; findings below may reference images not displayed]

FINDINGS: The heart size and mediastinal contours are within normal limits.
Both lungs are clear. The visualized skeletal structures are
unremarkable.
IMPRESSION: No active cardiopulmonary disease.

## 2018-07-20 ENCOUNTER — Emergency Department
Admission: EM | Admit: 2018-07-20 | Discharge: 2018-07-20 | Disposition: A | Discharge status: Home / Self Care | Payer: Medicaid Other | Attending: Emergency Medicine | Admitting: Emergency Medicine

## 2018-07-20 ENCOUNTER — Emergency Department: Payer: Medicaid Other

## 2018-07-20 ENCOUNTER — Other Ambulatory Visit: Payer: Self-pay

## 2018-07-20 DIAGNOSIS — R42 Dizziness and giddiness: Secondary | ICD-10-CM | POA: Diagnosis present

## 2018-07-20 DIAGNOSIS — E860 Dehydration: Secondary | ICD-10-CM

## 2018-07-20 DIAGNOSIS — E871 Hypo-osmolality and hyponatremia: Secondary | ICD-10-CM | POA: Diagnosis not present

## 2018-07-20 DIAGNOSIS — I129 Hypertensive chronic kidney disease with stage 1 through stage 4 chronic kidney disease, or unspecified chronic kidney disease: Secondary | ICD-10-CM | POA: Diagnosis not present

## 2018-07-20 DIAGNOSIS — Z79899 Other long term (current) drug therapy: Secondary | ICD-10-CM | POA: Insufficient documentation

## 2018-07-20 DIAGNOSIS — N183 Chronic kidney disease, stage 3 (moderate): Secondary | ICD-10-CM | POA: Diagnosis not present

## 2018-07-20 LAB — COMPREHENSIVE METABOLIC PANEL
ALK PHOS: 51 U/L (ref 38–126)
ALT: 13 U/L (ref 0–44)
ANION GAP: 10 (ref 5–15)
AST: 22 U/L (ref 15–41)
Albumin: 4.6 g/dL (ref 3.5–5.0)
BUN: 13 mg/dL (ref 6–20)
CALCIUM: 9.1 mg/dL (ref 8.9–10.3)
CHLORIDE: 95 mmol/L — AB (ref 98–111)
CO2: 24 mmol/L (ref 22–32)
CREATININE: 1.29 mg/dL — AB (ref 0.61–1.24)
GFR, EST NON AFRICAN AMERICAN: 59 mL/min — AB (ref >60)
Glucose, Bld: 86 mg/dL (ref 70–99)
Potassium: 3.6 mmol/L (ref 3.5–5.1)
Sodium: 129 mmol/L — ABNORMAL LOW (ref 135–145)
Total Bilirubin: 0.4 mg/dL (ref 0.3–1.2)
Total Protein: 7.2 g/dL (ref 6.5–8.1)

## 2018-07-20 LAB — CBC WITH DIFFERENTIAL/PLATELET
Basophils Absolute: 0 10*3/uL (ref 0–0.1)
Basophils Relative: 0 %
EOS PCT: 5 %
Eosinophils Absolute: 0.2 10*3/uL (ref 0–0.7)
HCT: 34.4 % — ABNORMAL LOW (ref 40.0–52.0)
Hemoglobin: 12 g/dL — ABNORMAL LOW (ref 13.0–18.0)
LYMPHS PCT: 40 %
Lymphs Abs: 1.5 10*3/uL (ref 1.0–3.6)
MCH: 34.8 pg — AB (ref 26.0–34.0)
MCHC: 35 g/dL (ref 32.0–36.0)
MCV: 99.4 fL (ref 80.0–100.0)
MONOS PCT: 14 %
Monocytes Absolute: 0.5 10*3/uL (ref 0.2–1.0)
Neutro Abs: 1.6 10*3/uL (ref 1.4–6.5)
Neutrophils Relative %: 41 %
PLATELETS: 179 10*3/uL (ref 150–440)
RBC: 3.46 MIL/uL — ABNORMAL LOW (ref 4.40–5.90)
RDW: 13.5 % (ref 11.5–14.5)
WBC: 3.8 10*3/uL (ref 3.8–10.6)

## 2018-07-20 LAB — URINALYSIS, COMPLETE (UACMP) WITH MICROSCOPIC
Bacteria, UA: NONE SEEN
Bilirubin Urine: NEGATIVE
GLUCOSE, UA: NEGATIVE mg/dL
HGB URINE DIPSTICK: NEGATIVE
Ketones, ur: NEGATIVE mg/dL
Leukocytes, UA: NEGATIVE
Nitrite: NEGATIVE
Protein, ur: NEGATIVE mg/dL
SPECIFIC GRAVITY, URINE: 1.006 (ref 1.005–1.030)
Squamous Epithelial / LPF: NONE SEEN (ref 0–5)
WBC, UA: NONE SEEN WBC/hpf (ref 0–5)
pH: 6 (ref 5.0–8.0)

## 2018-07-20 LAB — URINE DRUG SCREEN, QUALITATIVE (ARMC ONLY)
Amphetamines, Ur Screen: NOT DETECTED
Barbiturates, Ur Screen: NOT DETECTED
CANNABINOID 50 NG, UR ~~LOC~~: NOT DETECTED
Cocaine Metabolite,Ur ~~LOC~~: NOT DETECTED
MDMA (Ecstasy)Ur Screen: NOT DETECTED
Methadone Scn, Ur: NOT DETECTED
Opiate, Ur Screen: NOT DETECTED
Phencyclidine (PCP) Ur S: NOT DETECTED
TRICYCLIC, UR SCREEN: NOT DETECTED

## 2018-07-20 LAB — MAGNESIUM: Magnesium: 2.1 mg/dL (ref 1.7–2.4)

## 2018-07-20 LAB — ETHANOL: Alcohol, Ethyl (B): <10 mg/dL (ref <10)

## 2018-07-20 LAB — TROPONIN I

## 2018-07-20 LAB — VALPROIC ACID LEVEL: Valproic Acid Lvl: 98 ug/mL (ref 50.0–100.0)

## 2018-07-20 MED ORDER — SODIUM CHLORIDE 0.9 % IV BOLUS: 500.0000 mL | Freq: Once | INTRAVENOUS | Status: DC

## 2018-07-20 NOTE — ED Notes (Signed)
Red top sent at this time. RN will monitor for results

## 2018-07-20 NOTE — ED Provider Notes (Addendum)
[name] Emergency Department Provider Note  ____________________________________________   I have reviewed the triage vital signs and the nursing notes. Where available I have reviewed prior notes and, if possible and indicated, outside hospital notes.    HISTORY  Chief Complaint Dizziness    HPI [name] is a 57 y.o. male  With a history of migraines, seizures, CKD, essential tremors, and chronic hyponatremia for which he has been admitted in the past states that he feels generally weak and unwell for the last 3 to 4 days.  Worse this morning.  He states his eyes are "scrolling" which he has a difficult time explaining further.  He states he feels the way he did last time his sodium was low.  He denies any fever chills or vomiting.  EMS was called to the restaurant where he was.  He was able to finish his meal and walk with them to the door.  Patient states he is feeling a little unsteady on his feet however.  He denies any headache or focal numbness or weakness.  Has a baseline tremor which he states is unchanged from prior.  He denies any seizure activity.  He states he does take a salt pill every day because of his chronic hyponatremia.  He denies any headache stiff neck nausea vomiting chest pain diarrhea or anything else aside from "I feel like I am scrolling" and feel lightheaded.  Patient is somewhat of a limited historian.   Past Medical History:  Diagnosis Date  . Anemia   . GERD (gastroesophageal reflux disease)   . Hypertension   . Hyponatremia    chronic  . Kidney disease   . Migraine   . Seizures [facility])     Patient Active Problem List   Diagnosis Date Noted  . Iron deficiency anemia, unspecified   . Benign neoplasm of ascending colon   . Heartburn   . Gastritis   . Altered mental state   . Acute delirium 10/31/2015  . Fall   . Lightheadedness   . Laceration of scalp   . Near syncope 10/27/2015  . Poverty 10/27/2015  .  Symptomatic anemia   . Early satiety   . FTT (failure to thrive) in adult   . Protein-calorie malnutrition, severe ([place]) 09/07/2015  . Hyponatremia 09/05/2015  . Elevated troponin 09/05/2015  . Hypokalemia 09/05/2015  . Leukopenia 09/05/2015  . Renal insufficiency 09/05/2015  . Tremor, essential 07/15/2015  . Essential hypertension 07/15/2015  . Chronic renal insufficiency 07/15/2015  . Leg swelling 07/15/2015  . Partial epilepsy with impairment of consciousness ([place]) 05/19/2015  . Chronic kidney disease (CKD), stage III (moderate) ([place]) 05/19/2015  . Absolute anemia 05/19/2015  . Chest pain 04/17/2015    Past Surgical History:  Procedure Laterality Date  . COLONOSCOPY WITH PROPOFOL N/A 05/01/2016   Procedure: COLONOSCOPY WITH PROPOFOL;  Surgeon: Lucilla Lame, MD;  Location: [facility] ENDOSCOPY;  Service: Endoscopy;  Laterality: N/A;  . ESOPHAGOGASTRODUODENOSCOPY (EGD) WITH PROPOFOL N/A 05/01/2016   Procedure: ESOPHAGOGASTRODUODENOSCOPY (EGD) WITH PROPOFOL;  Surgeon: Lucilla Lame, MD;  Location: [facility] ENDOSCOPY;  Service: Endoscopy;  Laterality: N/A;  . none      Prior to Admission medications   Medication Sig Start Date End Date Taking? Authorizing Provider  azithromycin (ZITHROMAX Z-PAK) 250 MG tablet Take 2 tablets (500 mg) on  Day 1,  followed by 1 tablet (250 mg) once daily on Days 2 through 5. 12/16/16   Beers, Pierce Crane, PA-C  carbamazepine (TEGRETOL XR) 200  MG 12 hr tablet Take 1 tablet (200 mg total) by mouth 2 (two) times daily. 11/18/15   Vaughan Basta, MD  carbamazepine (TEGRETOL) 200 MG tablet Take 200-400 mg by mouth See admin instructions. Take 2 tablets (400) mg by mouth every morning, Take 1 tablet by mouth at noon, and Take 2 tablets (400 mg) by mouth every night at bedtime.    [provider]  chlorpheniramine (CHLOR-TRIMETON) 4 MG tablet Take 1 tablet (4 mg total) by mouth 2 (two) times daily as needed for allergies or rhinitis. 12/16/16   Beers, Pierce Crane,  PA-C  divalproex (DEPAKOTE) 250 MG DR tablet Take 3 tablets (750 mg total) by mouth every 12 (twelve) hours. Patient taking differently: Take 750 mg by mouth 2 (two) times daily.  11/18/15   Vaughan Basta, MD  famotidine (PEPCID) 20 MG tablet Take 1 tablet (20 mg total) by mouth 2 (two) times daily. Patient not taking: Reported on 09/11/2016 04/29/16   Carrie Mew, MD  feeding supplement, ENSURE ENLIVE, (ENSURE ENLIVE) LIQD Take 237 mLs by mouth 3 (three) times daily between meals. 09/15/16   Hower, Aaron Mose, MD  lisinopril (PRINIVIL,ZESTRIL) 5 MG tablet Take 5 mg by mouth at bedtime.    [provider]  propranolol (INDERAL) 20 MG tablet Take 20 mg by mouth 2 (two) times daily.    [provider]  propranolol (INDERAL) 40 MG tablet Take 1 tablet (40 mg total) by mouth 2 (two) times daily. 11/18/15   Vaughan Basta, MD  tamsulosin (FLOMAX) 0.4 MG CAPS capsule Take 1 capsule (0.4 mg total) by mouth daily. 09/16/16   Hower, Aaron Mose, MD    Allergies Nsaids  Family History  Problem Relation Age of Onset  . Heart attack Father   . Kidney disease Father     Social History Social History   Tobacco Use  . Smoking status: Never Smoker  . Smokeless tobacco: Never Used  Substance Use Topics  . Alcohol use: No  . Drug use: No    Review of Systems Constitutional: No fever/chills Eyes: No visual changes. ENT: No sore throat. No stiff neck no neck pain Cardiovascular: Denies chest pain. Respiratory: Denies shortness of breath. Gastrointestinal:   no vomiting.  No diarrhea.  No constipation. Genitourinary: Negative for dysuria. Musculoskeletal: Negative lower extremity swelling Skin: Negative for rash. Neurological: Negative for severe headaches, focal weakness or numbness.   ____________________________________________   PHYSICAL EXAM:  VITAL SIGNS: ED Triage Vitals [07/20/18 0842]  Enc Vitals Group     BP      Pulse      Resp      Temp       Temp src      SpO2      Weight 142 lb (64.4 kg)     Height 5\' 3"  (1.6 m)     Head Circumference      Peak Flow      Pain Score      Pain Loc      Pain Edu?      Excl. in [place]?     Constitutional: Alert and oriented. Well appearing and in no acute distress.  He has a somewhat unusual presentation he is wearing a large wig Eyes: Conjunctivae are normal Head: Atraumatic HEENT: No congestion/rhinnorhea. Mucous membranes are moist.  Oropharynx non-erythematous Neck:   Nontender with no meningismus, no masses, no stridor Cardiovascular: Normal rate, regular rhythm. Grossly normal heart sounds.  Good peripheral circulation. Respiratory: Normal respiratory effort.  No retractions. Lungs CTAB. Abdominal: Soft and nontender. No distention. No guarding no rebound Back:  There is no focal tenderness or step off.  there is no midline tenderness there are no lesions noted. there is no CVA tenderness Musculoskeletal: No lower extremity tenderness, no upper extremity tenderness. No joint effusions, no DVT signs strong distal pulses no edema Neurologic: He has a somewhat hesitant speech which he states is baseline for him, he has a diffuse tremor which she states is baseline for him, no focal findings. Skin:  Skin is warm, dry and intact. No rash noted. Psychiatric: Mood and affect are anxious. Speech and behavior are normal.  ____________________________________________   LABS (all labs ordered are listed, but only abnormal results are displayed)  Labs Reviewed  CBC WITH DIFFERENTIAL/PLATELET  COMPREHENSIVE METABOLIC PANEL  VALPROIC ACID LEVEL  TROPONIN I  MAGNESIUM  URINE DRUG SCREEN, QUALITATIVE ([facility] ONLY)  ETHANOL  URINALYSIS, COMPLETE ([facility]) WITH MICROSCOPIC  CBG MONITORING, ED    Pertinent labs  results that were available during my care of the patient were reviewed by me and considered in my medical decision making (see chart for  details). ____________________________________________  EKG  I personally interpreted any EKGs ordered by me or triage Sinus rhythm rate 67 bpm, there is likely repolarization abnormality normal axis no acute ischemia noted ____________________________________________  RADIOLOGY Pertinent labs & imaging results that were available during my care of the patient were reviewed by me and considered in my medical decision making (see chart for details). If possible, patient and/or family made aware of any abnormal findings.  ----------------------------------------- -----------    No results found. ____________________________________________    PROCEDURES  Procedure(s) performed: None  Procedures  Critical Care performed: None  ____________________________________________   INITIAL IMPRESSION / ASSESSMENT AND PLAN / ED COURSE  Pertinent labs & imaging results that were available during my care of the patient were reviewed by me and considered in my medical decision making (see chart for details).  Patient here feeling unwell with an idea that his vision is somehow different than normal because his eyes are "spiraling".  I do not see any nystagmus.  Not really following the light very well but is hard to tell that is because he cannot will not.  He seems to be easily distractible during the exam.  He has in the past presented with multiple different unusual complaints including diplopia only when wearing sunglasses and other things however, he has been admitted for hyponatremia in the past.  We will institute broad work-up and reevaluate.  I will start him on IV fluid.  ----------------------------------------- 11:36 AM on 07/20/2018 -----------------------------------------  Patient's sodium is 129 which actually for him is about average, he has a slight bump in his creatinine likely is a little bit dehydrated.  Last time he presented with similar symptoms complaining of all of  this, when his sodium was reassuring, he was diagnosed with dehydration which I feels the right diagnosis.  After IV fluid, patient has no complaints.  Neurologically intact at this time, he is eager to go home he wants to go home and take his home Tegretol he is neurologically intact and has stated that his symptoms have resolved.  I did an exhaustive work-up on him in the emergency room to the extent that I can and everything is negative I do not think he meets needs to be admitted there is no evidence of CVA at this time patient has been having symptoms now he states for over  a week and his CT is negative.  We will discharge him with close outpatient follow-up return precautions given and understood    ____________________________________________   FINAL CLINICAL IMPRESSION(S) / ED DIAGNOSES  Final diagnoses:  None      This chart was dictated using voice recognition software.  Despite best efforts to proofread,  errors can occur which can change meaning.      Schuyler Amor, MD 07/20/18 2671    Schuyler Amor, MD 07/20/18 1137

## 2018-07-20 NOTE — Discharge Instructions (Signed)
Drink plenty of ultralight containing fluids, return to the emergency room if you have any new or worrisome symptoms

## 2018-07-20 NOTE — ED Notes (Signed)
Pt was placed in lobby, Pt is A/ox4 and is calling friends or cab for ride to car at [employer] [employer].

## 2018-07-20 NOTE — ED Notes (Signed)
.   Pt is resting, Respirations even and unlabored, NAD. Stretcher lowest postion and locked. Call bell within reach. Denies any needs at this time RN will continue to monitor.    

## 2018-07-20 NOTE — ED Triage Notes (Signed)
Pt presents via [facility] from [facility] for vision changes with dizzy ambulatory with assistance to truck. Previous hospital for dizzy for electrolyte imbalnace.    191/89 89  63 100 RA

## 2018-07-20 NOTE — ED Notes (Signed)
Pt states he is feeling better.  

## 2018-07-31 ENCOUNTER — Encounter: Payer: Self-pay | Admitting: Emergency Medicine

## 2018-07-31 ENCOUNTER — Emergency Department
Admission: EM | Admit: 2018-07-31 | Discharge: 2018-07-31 | Disposition: A | Discharge status: Home / Self Care | Payer: Medicaid Other | Attending: Emergency Medicine | Admitting: Emergency Medicine

## 2018-07-31 ENCOUNTER — Other Ambulatory Visit: Payer: Self-pay

## 2018-07-31 DIAGNOSIS — I129 Hypertensive chronic kidney disease with stage 1 through stage 4 chronic kidney disease, or unspecified chronic kidney disease: Secondary | ICD-10-CM | POA: Insufficient documentation

## 2018-07-31 DIAGNOSIS — N183 Chronic kidney disease, stage 3 (moderate): Secondary | ICD-10-CM | POA: Diagnosis not present

## 2018-07-31 DIAGNOSIS — K625 Hemorrhage of anus and rectum: Secondary | ICD-10-CM | POA: Diagnosis present

## 2018-07-31 DIAGNOSIS — K649 Unspecified hemorrhoids: Secondary | ICD-10-CM | POA: Diagnosis not present

## 2018-07-31 DIAGNOSIS — Z79899 Other long term (current) drug therapy: Secondary | ICD-10-CM | POA: Diagnosis not present

## 2018-07-31 LAB — COMPREHENSIVE METABOLIC PANEL
ALBUMIN: 4.3 g/dL (ref 3.5–5.0)
ALK PHOS: 30 U/L — AB (ref 38–126)
ALT: 13 U/L (ref 0–44)
ANION GAP: 10 (ref 5–15)
AST: 17 U/L (ref 15–41)
BILIRUBIN TOTAL: 0.8 mg/dL (ref 0.3–1.2)
BUN: 28 mg/dL — AB (ref 6–20)
CALCIUM: 9.6 mg/dL (ref 8.9–10.3)
CO2: 25 mmol/L (ref 22–32)
CREATININE: 1.18 mg/dL (ref 0.61–1.24)
Chloride: 98 mmol/L (ref 98–111)
GFR calc Af Amer: >60 mL/min (ref >60)
GFR calc non Af Amer: >60 mL/min (ref >60)
GLUCOSE: 84 mg/dL (ref 70–99)
Potassium: 4.4 mmol/L (ref 3.5–5.1)
Sodium: 133 mmol/L — ABNORMAL LOW (ref 135–145)
TOTAL PROTEIN: 7.1 g/dL (ref 6.5–8.1)

## 2018-07-31 LAB — CBC
HCT: 32.5 % — ABNORMAL LOW (ref 40.0–52.0)
HEMOGLOBIN: 11.4 g/dL — AB (ref 13.0–18.0)
MCH: 34.7 pg — AB (ref 26.0–34.0)
MCHC: 35.2 g/dL (ref 32.0–36.0)
MCV: 98.8 fL (ref 80.0–100.0)
PLATELETS: 187 10*3/uL (ref 150–440)
RBC: 3.3 MIL/uL — ABNORMAL LOW (ref 4.40–5.90)
RDW: 13 % (ref 11.5–14.5)
WBC: 3.1 10*3/uL — ABNORMAL LOW (ref 3.8–10.6)

## 2018-07-31 MED ORDER — HYDROCORTISONE ACETATE 25 MG RE SUPP
25.0000 mg | Freq: Once | RECTAL | Status: AC
  Administered 2018-07-31: 25 mg via RECTAL
  Filled 2018-07-31: qty 1

## 2018-07-31 MED ORDER — HYDROCORTISONE ACETATE 25 MG RE SUPP: 25.0000 mg | Freq: Two times a day (BID) | RECTAL | 0 refills | Status: AC | PRN

## 2018-07-31 NOTE — ED Notes (Signed)
Called pharmacy to check on status of medication, stated that it will be here shortly.

## 2018-07-31 NOTE — ED Notes (Signed)
Pt stated that he started having rectal bleeding and it has got worse over since yesterday. Pt was at his PCP on Monday for a routine visit. Dr. Owens Shark at bedside and rectal exam done,  no active bleeding noted at this time.

## 2018-07-31 NOTE — ED Notes (Signed)
Lab results reviewed. Awaiting room for MD eval.  

## 2018-07-31 NOTE — ED Triage Notes (Addendum)
Patient to ER from home via [facility] for c/o rectal bleeding. Patient's vitals stable per EMS. Patient states bleeding is bright pink with wiping. Has not had BM today, but had normal BM yesterday. States on Monday he had check up, noticed that he did have pain with BM at that time, but no bleeding.

## 2018-08-07 NOTE — ED Provider Notes (Signed)
[facility] Emergency Department Provider Note   First MD Initiated Contact with Patient 07/31/18 [phone]     (approximate)  I have reviewed the triage vital signs and the nursing notes.   HISTORY  Chief Complaint Rectal Bleeding    HPI [name] is a 57 y.o. male below list of chronic medical conditions presents to the emergency department via EMS secondary to bright red blood per rectum.  Patient states that he has noted bright pink while wiping after defecation.  Patient states that that he had painful defecation on Monday and subsequently noted bright pink on wiping and in his underwear since then.  Patient denies any abdominal pain.  Patient denies any palpitations lightheadedness nausea or vomiting.  Patient denies any urinary symptoms.   Past Medical History:  Diagnosis Date  . Anemia   . GERD (gastroesophageal reflux disease)   . Hypertension   . Hyponatremia    chronic  . Kidney disease   . Migraine   . Seizures [facility])     Patient Active Problem List   Diagnosis Date Noted  . Iron deficiency anemia, unspecified   . Benign neoplasm of ascending colon   . Heartburn   . Gastritis   . Altered mental state   . Acute delirium 10/31/2015  . Fall   . Lightheadedness   . Laceration of scalp   . Near syncope 10/27/2015  . Poverty 10/27/2015  . Symptomatic anemia   . Early satiety   . FTT (failure to thrive) in adult   . Protein-calorie malnutrition, severe ([place]) 09/07/2015  . Hyponatremia 09/05/2015  . Elevated troponin 09/05/2015  . Hypokalemia 09/05/2015  . Leukopenia 09/05/2015  . Renal insufficiency 09/05/2015  . Tremor, essential 07/15/2015  . Essential hypertension 07/15/2015  . Chronic renal insufficiency 07/15/2015  . Leg swelling 07/15/2015  . Partial epilepsy with impairment of consciousness ([place]) 05/19/2015  . Chronic kidney disease (CKD), stage III (moderate) ([place]) 05/19/2015  . Absolute anemia 05/19/2015  . Chest  pain 04/17/2015    Past Surgical History:  Procedure Laterality Date  . COLONOSCOPY WITH PROPOFOL N/A 05/01/2016   Procedure: COLONOSCOPY WITH PROPOFOL;  Surgeon: Lucilla Lame, MD;  Location: [facility] ENDOSCOPY;  Service: Endoscopy;  Laterality: N/A;  . ESOPHAGOGASTRODUODENOSCOPY (EGD) WITH PROPOFOL N/A 05/01/2016   Procedure: ESOPHAGOGASTRODUODENOSCOPY (EGD) WITH PROPOFOL;  Surgeon: Lucilla Lame, MD;  Location: [facility] ENDOSCOPY;  Service: Endoscopy;  Laterality: N/A;  . none      Prior to Admission medications   Medication Sig Start Date End Date Taking? Authorizing Provider  azithromycin (ZITHROMAX Z-PAK) 250 MG tablet Take 2 tablets (500 mg) on  Day 1,  followed by 1 tablet (250 mg) once daily on Days 2 through 5. 12/16/16   Beers, Pierce Crane, PA-C  carbamazepine (TEGRETOL XR) 200 MG 12 hr tablet Take 1 tablet (200 mg total) by mouth 2 (two) times daily. 11/18/15   Vaughan Basta, MD  carbamazepine (TEGRETOL) 200 MG tablet Take 200-400 mg by mouth See admin instructions. Take 2 tablets (400) mg by mouth every morning, Take 1 tablet by mouth at noon, and Take 2 tablets (400 mg) by mouth every night at bedtime.    [provider]  chlorpheniramine (CHLOR-TRIMETON) 4 MG tablet Take 1 tablet (4 mg total) by mouth 2 (two) times daily as needed for allergies or rhinitis. 12/16/16   Beers, Pierce Crane, PA-C  divalproex (DEPAKOTE) 250 MG DR tablet Take 3 tablets (750 mg total) by mouth every 12 (twelve)  hours. Patient taking differently: Take 750 mg by mouth 2 (two) times daily.  11/18/15   Vaughan Basta, MD  famotidine (PEPCID) 20 MG tablet Take 1 tablet (20 mg total) by mouth 2 (two) times daily. Patient not taking: Reported on 09/11/2016 04/29/16   Carrie Mew, MD  feeding supplement, ENSURE ENLIVE, (ENSURE ENLIVE) LIQD Take 237 mLs by mouth 3 (three) times daily between meals. 09/15/16   Hower, Aaron Mose, MD  hydrocortisone (ANUSOL-HC) 25 MG suppository Place 1 suppository (25 mg  total) rectally 2 (two) times daily as needed for up to 12 days for hemorrhoids or anal itching. 07/31/18 08/12/18  Gregor Hams, MD  lisinopril (PRINIVIL,ZESTRIL) 5 MG tablet Take 5 mg by mouth at bedtime.    [provider]  propranolol (INDERAL) 20 MG tablet Take 20 mg by mouth 2 (two) times daily.    [provider]  propranolol (INDERAL) 40 MG tablet Take 1 tablet (40 mg total) by mouth 2 (two) times daily. 11/18/15   Vaughan Basta, MD  tamsulosin (FLOMAX) 0.4 MG CAPS capsule Take 1 capsule (0.4 mg total) by mouth daily. 09/16/16   Hower, Aaron Mose, MD    Allergies Nsaids  Family History  Problem Relation Age of Onset  . Heart attack Father   . Kidney disease Father     Social History Social History   Tobacco Use  . Smoking status: Never Smoker  . Smokeless tobacco: Never Used  Substance Use Topics  . Alcohol use: No  . Drug use: No    Review of Systems Constitutional: No fever/chills Eyes: No visual changes. ENT: No sore throat. Cardiovascular: Denies chest pain. Respiratory: Denies shortness of breath. Gastrointestinal: No abdominal pain.  No nausea, no vomiting.  No diarrhea.  No constipation.  Positive for bright red blood per rectum Genitourinary: Negative for dysuria. Musculoskeletal: Negative for neck pain.  Negative for back pain. Integumentary: Negative for rash. Neurological: Negative for headaches, focal weakness or numbness.   ____________________________________________   PHYSICAL EXAM:  VITAL SIGNS: ED Triage Vitals [07/31/18 0045]  Enc Vitals Group     BP (!) 145/90     Pulse Rate 67     Resp 20     Temp 98.1 F (36.7 C)     Temp Source Oral     SpO2 99 %     Weight 64.4 kg (141 lb 15.6 oz)     Height 1.6 m (5\' 3" )     Head Circumference      Peak Flow      Pain Score 0     Pain Loc      Pain Edu?      Excl. in [place]?     Constitutional: Alert and oriented. Well appearing and in no acute distress. Eyes:  Conjunctivae are normal. Head: Atraumatic. Mouth/Throat: Mucous membranes are moist.  Oropharynx non-erythematous. Neck: No stridor.  Cardiovascular: Normal rate, regular rhythm. Good peripheral circulation. Grossly normal heart sounds. Respiratory: Normal respiratory effort.  No retractions. Lungs CTAB. Gastrointestinal: Soft and nontender. No distention.  Guaiac positive.  External/internal hemorrhoid noted with evidence of recent bleeding (nonthrombosed) Musculoskeletal: No lower extremity tenderness nor edema. No gross deformities of extremities. Neurologic:  Normal speech and language. No gross focal neurologic deficits are appreciated.  Skin:  Skin is warm, dry and intact. No rash noted. Psychiatric: Mood and affect are normal. Speech and behavior are normal.  ____________________________________________   LABS (all labs ordered are listed, but only abnormal results are displayed)  Labs Reviewed  COMPREHENSIVE METABOLIC PANEL - Abnormal; Notable for the following components:      Result Value   Sodium 133 (*)    BUN 28 (*)    Alkaline Phosphatase 30 (*)    All other components within normal limits  CBC - Abnormal; Notable for the following components:   WBC 3.1 (*)    RBC 3.30 (*)    Hemoglobin 11.4 (*)    HCT 32.5 (*)    MCH 34.7 (*)    All other components within normal limits   _   Procedures   ____________________________________________   INITIAL IMPRESSION / ASSESSMENT AND PLAN / ED COURSE  As part of my medical decision making, I reviewed the following data within the electronic MEDICAL RECORD NUMBER   57 year old male presented with above-stated history and physical exam consistent with bright red blood per rectum secondary to hemorrhoid which was noted on exam.  Also considered possibility of other potential etiologies of bright red blood per rectum including diverticulosis AVM etc. however given clinical finding of a hemorrhoid with signs of recent bleeding I  suspect this to be the etiology of the patient's symptoms. ____________________________________________  FINAL CLINICAL IMPRESSION(S) / ED DIAGNOSES  Final diagnoses:  Hemorrhoids, unspecified hemorrhoid type     MEDICATIONS GIVEN DURING THIS VISIT:  Medications  hydrocortisone (ANUSOL-HC) suppository 25 mg (25 mg Rectal Given 07/31/18 0448)     ED Discharge Orders         Ordered    hydrocortisone (ANUSOL-HC) 25 MG suppository  2 times daily PRN     07/31/18 0443           Note:  This document was prepared using Dragon voice recognition software and may include unintentional dictation errors.    Gregor Hams, MD 08/07/18 [phone]

## 2018-12-20 ENCOUNTER — Emergency Department
Admission: EM | Admit: 2018-12-20 | Discharge: 2018-12-20 | Disposition: A | Discharge status: Home / Self Care | Payer: Medicaid Other | Attending: Emergency Medicine | Admitting: Emergency Medicine

## 2018-12-20 ENCOUNTER — Emergency Department: Payer: Medicaid Other

## 2018-12-20 ENCOUNTER — Encounter: Payer: Self-pay | Admitting: Emergency Medicine

## 2018-12-20 DIAGNOSIS — N183 Chronic kidney disease, stage 3 (moderate): Secondary | ICD-10-CM | POA: Diagnosis not present

## 2018-12-20 DIAGNOSIS — R42 Dizziness and giddiness: Secondary | ICD-10-CM | POA: Insufficient documentation

## 2018-12-20 DIAGNOSIS — H7492 Unspecified disorder of left middle ear and mastoid: Secondary | ICD-10-CM | POA: Insufficient documentation

## 2018-12-20 DIAGNOSIS — M25562 Pain in left knee: Secondary | ICD-10-CM | POA: Diagnosis not present

## 2018-12-20 DIAGNOSIS — I129 Hypertensive chronic kidney disease with stage 1 through stage 4 chronic kidney disease, or unspecified chronic kidney disease: Secondary | ICD-10-CM | POA: Diagnosis not present

## 2018-12-20 LAB — COMPREHENSIVE METABOLIC PANEL
ALT: 16 U/L (ref 0–44)
ANION GAP: 10 (ref 5–15)
AST: 26 U/L (ref 15–41)
Albumin: 4.2 g/dL (ref 3.5–5.0)
Alkaline Phosphatase: 30 U/L — ABNORMAL LOW (ref 38–126)
BUN: 23 mg/dL — ABNORMAL HIGH (ref 6–20)
CHLORIDE: 99 mmol/L (ref 98–111)
CO2: 23 mmol/L (ref 22–32)
CREATININE: 1.02 mg/dL (ref 0.61–1.24)
Calcium: 9.5 mg/dL (ref 8.9–10.3)
GFR calc Af Amer: >60 mL/min (ref >60)
GFR calc non Af Amer: >60 mL/min (ref >60)
GLUCOSE: 97 mg/dL (ref 70–99)
Potassium: 5 mmol/L (ref 3.5–5.1)
Sodium: 132 mmol/L — ABNORMAL LOW (ref 135–145)
TOTAL PROTEIN: 7 g/dL (ref 6.5–8.1)
Total Bilirubin: 0.8 mg/dL (ref 0.3–1.2)

## 2018-12-20 LAB — CBC WITH DIFFERENTIAL/PLATELET
Abs Immature Granulocytes: 0.01 10*3/uL (ref 0.00–0.07)
BASOS ABS: 0 10*3/uL (ref 0.0–0.1)
BASOS PCT: 1 %
EOS PCT: 3 %
Eosinophils Absolute: 0.1 10*3/uL (ref 0.0–0.5)
HCT: 35.9 % — ABNORMAL LOW (ref 39.0–52.0)
HEMOGLOBIN: 12.1 g/dL — AB (ref 13.0–17.0)
IMMATURE GRANULOCYTES: 0 %
LYMPHS ABS: 1.9 10*3/uL (ref 0.7–4.0)
Lymphocytes Relative: 43 %
MCH: 33 pg (ref 26.0–34.0)
MCHC: 33.7 g/dL (ref 30.0–36.0)
MCV: 97.8 fL (ref 80.0–100.0)
MONOS PCT: 10 %
Monocytes Absolute: 0.5 10*3/uL (ref 0.1–1.0)
Neutro Abs: 1.9 10*3/uL (ref 1.7–7.7)
Neutrophils Relative %: 43 %
Platelets: 163 10*3/uL (ref 150–400)
RBC: 3.67 MIL/uL — ABNORMAL LOW (ref 4.22–5.81)
RDW: 13.5 % (ref 11.5–15.5)
WBC: 4.4 10*3/uL (ref 4.0–10.5)
nRBC: 0 % (ref 0.0–0.2)

## 2018-12-20 LAB — CARBAMAZEPINE LEVEL, TOTAL: Carbamazepine Lvl: 8.4 ug/mL (ref 4.0–12.0)

## 2018-12-20 LAB — TROPONIN I: Troponin I: <0.03 ng/mL (ref <0.03)

## 2018-12-20 MED ORDER — AMOXICILLIN 500 MG PO CAPS: 500.0000 mg | ORAL_CAPSULE | Freq: Three times a day (TID) | ORAL | 0 refills | Status: DC

## 2018-12-20 MED ORDER — PREDNISONE 50 MG PO TABS: ORAL_TABLET | ORAL | 0 refills | Status: DC

## 2018-12-20 NOTE — ED Provider Notes (Signed)
[facility] Emergency Department Provider Note       Time seen: ----------------------------------------- 3:22 PM on 12/20/2018 -----------------------------------------   I have reviewed the triage vital signs and the nursing notes.  HISTORY   Chief Complaint No chief complaint on file.    HPI [name] is a 58 y.o. male with a history of anemia, GERD, hypertension, hyponatremia, renal disease, migraine, seizures who presents to the ED for balance trouble.  Patient's had dizziness and feeling unbalanced for about 2 weeks.  Patient states he also failure the yesterday the day before and has some soreness to his left knee but is able to walk.  He has a history of high blood pressure but states he is compliant with all of his medications.  In the past he has had low sodium which has caused some balance trouble.  Past Medical History:  Diagnosis Date  . Anemia   . GERD (gastroesophageal reflux disease)   . Hypertension   . Hyponatremia    chronic  . Kidney disease   . Migraine   . Seizures [facility])     Patient Active Problem List   Diagnosis Date Noted  . Iron deficiency anemia, unspecified   . Benign neoplasm of ascending colon   . Heartburn   . Gastritis   . Altered mental state   . Acute delirium 10/31/2015  . Fall   . Lightheadedness   . Laceration of scalp   . Near syncope 10/27/2015  . Poverty 10/27/2015  . Symptomatic anemia   . Early satiety   . FTT (failure to thrive) in adult   . Protein-calorie malnutrition, severe ([place]) 09/07/2015  . Hyponatremia 09/05/2015  . Elevated troponin 09/05/2015  . Hypokalemia 09/05/2015  . Leukopenia 09/05/2015  . Renal insufficiency 09/05/2015  . Tremor, essential 07/15/2015  . Essential hypertension 07/15/2015  . Chronic renal insufficiency 07/15/2015  . Leg swelling 07/15/2015  . Partial epilepsy with impairment of consciousness ([place]) 05/19/2015  . Chronic kidney disease (CKD), stage III  (moderate) ([place]) 05/19/2015  . Absolute anemia 05/19/2015  . Chest pain 04/17/2015    Past Surgical History:  Procedure Laterality Date  . COLONOSCOPY WITH PROPOFOL N/A 05/01/2016   Procedure: COLONOSCOPY WITH PROPOFOL;  Surgeon: Lucilla Lame, MD;  Location: [facility] ENDOSCOPY;  Service: Endoscopy;  Laterality: N/A;  . ESOPHAGOGASTRODUODENOSCOPY (EGD) WITH PROPOFOL N/A 05/01/2016   Procedure: ESOPHAGOGASTRODUODENOSCOPY (EGD) WITH PROPOFOL;  Surgeon: Lucilla Lame, MD;  Location: [facility] ENDOSCOPY;  Service: Endoscopy;  Laterality: N/A;  . none      Allergies Nsaids  Social History Social History   Tobacco Use  . Smoking status: Never Smoker  . Smokeless tobacco: Never Used  Substance Use Topics  . Alcohol use: No  . Drug use: No   Review of Systems Constitutional: Negative for fever. Cardiovascular: Negative for chest pain. Respiratory: Negative for shortness of breath. Gastrointestinal: Negative for abdominal pain, vomiting and diarrhea. Musculoskeletal: Negative for back pain. Skin: Negative for rash. Neurological: Negative for headaches, focal weakness or numbness.  Positive for balance disturbance  All systems negative/normal/unremarkable except as stated in the HPI  ____________________________________________   PHYSICAL EXAM:  VITAL SIGNS: ED Triage Vitals  Enc Vitals Group     BP      Pulse      Resp      Temp      Temp src      SpO2      Weight      Height  Head Circumference      Peak Flow      Pain Score      Pain Loc      Pain Edu?      Excl. in [place]?    Constitutional: No acute distress Eyes: Conjunctivae are normal. Normal extraocular movements. ENT      Head: Normocephalic and atraumatic.      Nose: No congestion/rhinnorhea.      Mouth/Throat: Mucous membranes are moist.      Neck: No stridor. Cardiovascular: Normal rate, regular rhythm. No murmurs, rubs, or gallops. Respiratory: Normal respiratory effort without tachypnea nor retractions.  Breath sounds are clear and equal bilaterally. No wheezes/rales/rhonchi. Gastrointestinal: Soft and nontender. Normal bowel sounds Musculoskeletal: Nontender with normal range of motion in extremities. No lower extremity tenderness nor edema. Neurologic:  Normal speech and language. No gross focal neurologic deficits are appreciated.  Strength, sensation, cranial nerves.  Normal. Skin:  Skin is warm, dry and intact. No rash noted. Psychiatric: Mood and affect are normal. Speech and behavior are normal.  ____________________________________________  EKG: Interpreted by me.  Sinus rhythm the rate of 75 bpm, normal PR interval, normal QRS, normal QT  ____________________________________________  ED COURSE:  As part of my medical decision making, I reviewed the following data within the [place] History obtained from family if available, nursing notes, old chart and ekg, as well as notes from prior ED visits. Patient presented for balance disturbance, we will assess with labs and imaging as indicated at this time.   Procedures ____________________________________________   LABS (pertinent positives/negatives)  Labs Reviewed  CBC WITH DIFFERENTIAL/PLATELET - Abnormal; Notable for the following components:      Result Value   RBC 3.67 (*)    Hemoglobin 12.1 (*)    HCT 35.9 (*)    All other components within normal limits  COMPREHENSIVE METABOLIC PANEL - Abnormal; Notable for the following components:   Sodium 132 (*)    BUN 23 (*)    Alkaline Phosphatase 30 (*)    All other components within normal limits  TROPONIN I  URINALYSIS, COMPLETE (UACMP) WITH MICROSCOPIC  CBG MONITORING, ED    RADIOLOGY Images were viewed by me  CT head IMPRESSION: 1. No acute intracranial abnormalities. 2. Atrophy advanced for age. 3. Partial opacification of the left middle air cavity and mastoid air cells. This is new since the prior CT. Consider inflammation if there are  symptoms of mastoid/ear pain on the left. ____________________________________________   DIFFERENTIAL DIAGNOSIS   Dehydration, electrolyte abnormality, CVA, vertigo  FINAL ASSESSMENT AND PLAN  Dizziness, cerumen impaction   Plan: The patient had presented for dizziness. Patient's labs are reassuring overall. Patient's imaging not reveal any acute process.  We did irrigate both ear canals because he appeared to have cerumen impaction.  He is cleared for outpatient follow-up.  Unclear if the CT findings with fluid in the mastoid air cells is actually clinically significant.  I will try a short course of prednisone and amoxicillin.   Laurence Aly, MD    Note: This note was generated in part or whole with voice recognition software. Voice recognition is usually quite accurate but there are transcription errors that can and very often do occur. I apologize for any typographical errors that were not detected and corrected.     Earleen Newport, MD 12/20/18 [phone]

## 2018-12-20 NOTE — ED Triage Notes (Signed)
Pt to ED with c/o of dizziness and feeling "unbalanced" for approx 2 weeks. Pt states he also fell either yesterday or the day before and has c/o of pain to the left knee. Pt has hx of high BP and states he is compliant with his meds.

## 2018-12-20 NOTE — ED Notes (Signed)
Pt's ears irrigated by Jinny Blossom, RN and Terri Piedra, RN per order.

## 2018-12-20 NOTE — ED Notes (Signed)
Pt verbalized understanding of discharge instructions. NAD at this time. 

## 2019-08-10 ENCOUNTER — Inpatient Hospital Stay
Admission: EM | Admit: 2019-08-10 | Discharge: 2019-08-12 | DRG: 641 | Disposition: A | Discharge status: Home / Self Care | Payer: Medicaid Other | Attending: Internal Medicine | Admitting: Internal Medicine

## 2019-08-10 ENCOUNTER — Other Ambulatory Visit: Payer: Self-pay

## 2019-08-10 ENCOUNTER — Emergency Department: Payer: Medicaid Other

## 2019-08-10 ENCOUNTER — Encounter: Payer: Self-pay | Admitting: *Deleted

## 2019-08-10 DIAGNOSIS — Z792 Long term (current) use of antibiotics: Secondary | ICD-10-CM

## 2019-08-10 DIAGNOSIS — E871 Hypo-osmolality and hyponatremia: Secondary | ICD-10-CM | POA: Diagnosis present

## 2019-08-10 DIAGNOSIS — G43909 Migraine, unspecified, not intractable, without status migrainosus: Secondary | ICD-10-CM | POA: Diagnosis present

## 2019-08-10 DIAGNOSIS — Z20828 Contact with and (suspected) exposure to other viral communicable diseases: Secondary | ICD-10-CM | POA: Diagnosis present

## 2019-08-10 DIAGNOSIS — Z008 Encounter for other general examination: Secondary | ICD-10-CM | POA: Diagnosis not present

## 2019-08-10 DIAGNOSIS — E860 Dehydration: Secondary | ICD-10-CM | POA: Diagnosis present

## 2019-08-10 DIAGNOSIS — N189 Chronic kidney disease, unspecified: Secondary | ICD-10-CM | POA: Diagnosis present

## 2019-08-10 DIAGNOSIS — R112 Nausea with vomiting, unspecified: Secondary | ICD-10-CM | POA: Diagnosis present

## 2019-08-10 DIAGNOSIS — Z791 Long term (current) use of non-steroidal anti-inflammatories (NSAID): Secondary | ICD-10-CM

## 2019-08-10 DIAGNOSIS — Z7952 Long term (current) use of systemic steroids: Secondary | ICD-10-CM

## 2019-08-10 DIAGNOSIS — I129 Hypertensive chronic kidney disease with stage 1 through stage 4 chronic kidney disease, or unspecified chronic kidney disease: Secondary | ICD-10-CM | POA: Diagnosis present

## 2019-08-10 DIAGNOSIS — Z79899 Other long term (current) drug therapy: Secondary | ICD-10-CM

## 2019-08-10 DIAGNOSIS — Z8249 Family history of ischemic heart disease and other diseases of the circulatory system: Secondary | ICD-10-CM

## 2019-08-10 DIAGNOSIS — Z841 Family history of disorders of kidney and ureter: Secondary | ICD-10-CM

## 2019-08-10 DIAGNOSIS — R631 Polydipsia: Secondary | ICD-10-CM | POA: Diagnosis present

## 2019-08-10 DIAGNOSIS — N400 Enlarged prostate without lower urinary tract symptoms: Secondary | ICD-10-CM | POA: Diagnosis present

## 2019-08-10 DIAGNOSIS — R569 Unspecified convulsions: Secondary | ICD-10-CM | POA: Diagnosis present

## 2019-08-10 DIAGNOSIS — K219 Gastro-esophageal reflux disease without esophagitis: Secondary | ICD-10-CM | POA: Diagnosis present

## 2019-08-10 LAB — CBC WITH DIFFERENTIAL/PLATELET
Abs Immature Granulocytes: 0.03 10*3/uL (ref 0.00–0.07)
Basophils Absolute: 0 10*3/uL (ref 0.0–0.1)
Basophils Relative: 0 %
Eosinophils Absolute: 0.1 10*3/uL (ref 0.0–0.5)
Eosinophils Relative: 1 %
HCT: 32.5 % — ABNORMAL LOW (ref 39.0–52.0)
Hemoglobin: 11.7 g/dL — ABNORMAL LOW (ref 13.0–17.0)
Immature Granulocytes: 1 %
Lymphocytes Relative: 30 %
Lymphs Abs: 1.7 10*3/uL (ref 0.7–4.0)
MCH: 32.5 pg (ref 26.0–34.0)
MCHC: 36 g/dL (ref 30.0–36.0)
MCV: 90.3 fL (ref 80.0–100.0)
Monocytes Absolute: 0.4 10*3/uL (ref 0.1–1.0)
Monocytes Relative: 8 %
Neutro Abs: 3.4 10*3/uL (ref 1.7–7.7)
Neutrophils Relative %: 60 %
Platelets: 198 10*3/uL (ref 150–400)
RBC: 3.6 MIL/uL — ABNORMAL LOW (ref 4.22–5.81)
RDW: 12.9 % (ref 11.5–15.5)
WBC: 5.6 10*3/uL (ref 4.0–10.5)
nRBC: 0 % (ref 0.0–0.2)

## 2019-08-10 LAB — COMPREHENSIVE METABOLIC PANEL
ALT: 23 U/L (ref 0–44)
AST: 24 U/L (ref 15–41)
Albumin: 4.2 g/dL (ref 3.5–5.0)
Alkaline Phosphatase: 29 U/L — ABNORMAL LOW (ref 38–126)
Anion gap: 14 (ref 5–15)
BUN: 14 mg/dL (ref 6–20)
CO2: 21 mmol/L — ABNORMAL LOW (ref 22–32)
Calcium: 8.9 mg/dL (ref 8.9–10.3)
Chloride: 89 mmol/L — ABNORMAL LOW (ref 98–111)
Creatinine, Ser: 1.01 mg/dL (ref 0.61–1.24)
GFR calc Af Amer: >60 mL/min (ref >60)
GFR calc non Af Amer: >60 mL/min (ref >60)
Glucose, Bld: 92 mg/dL (ref 70–99)
Potassium: 4.1 mmol/L (ref 3.5–5.1)
Sodium: 124 mmol/L — ABNORMAL LOW (ref 135–145)
Total Bilirubin: 1 mg/dL (ref 0.3–1.2)
Total Protein: 6.9 g/dL (ref 6.5–8.1)

## 2019-08-10 LAB — TSH: TSH: 1.344 u[IU]/mL (ref 0.350–4.500)

## 2019-08-10 LAB — HEMOGLOBIN A1C
Hgb A1c MFr Bld: 5.6 % (ref 4.8–5.6)
Mean Plasma Glucose: 114.02 mg/dL

## 2019-08-10 LAB — SODIUM: Sodium: 125 mmol/L — ABNORMAL LOW (ref 135–145)

## 2019-08-10 LAB — SARS CORONAVIRUS 2 (TAT 6-24 HRS): SARS Coronavirus 2: NEGATIVE

## 2019-08-10 MED ORDER — ENOXAPARIN SODIUM 40 MG/0.4ML ~~LOC~~ SOLN
40.0000 mg | Freq: Every day | SUBCUTANEOUS | Status: DC
  Administered 2019-08-10 – 2019-08-11 (×2): 40 mg via SUBCUTANEOUS
  Filled 2019-08-10 (×2): qty 0.4

## 2019-08-10 MED ORDER — SODIUM CHLORIDE 0.9 % IV BOLUS
500.0000 mL | Freq: Once | INTRAVENOUS | Status: AC
  Administered 2019-08-10: 500 mL via INTRAVENOUS

## 2019-08-10 MED ORDER — CARBAMAZEPINE 200 MG PO TABS
200.0000 mg | ORAL_TABLET | Freq: Every day | ORAL | Status: DC
  Administered 2019-08-10 – 2019-08-11 (×2): 200 mg via ORAL
  Filled 2019-08-10 (×3): qty 1

## 2019-08-10 MED ORDER — DOCUSATE SODIUM 100 MG PO CAPS
100.0000 mg | ORAL_CAPSULE | Freq: Two times a day (BID) | ORAL | Status: DC
  Administered 2019-08-10 – 2019-08-12 (×5): 100 mg via ORAL
  Filled 2019-08-10 (×5): qty 1

## 2019-08-10 MED ORDER — CARBAMAZEPINE 200 MG PO TABS
400.0000 mg | ORAL_TABLET | Freq: Two times a day (BID) | ORAL | Status: DC
  Administered 2019-08-10 – 2019-08-12 (×5): 400 mg via ORAL
  Filled 2019-08-10 (×6): qty 2

## 2019-08-10 MED ORDER — ONDANSETRON HCL 4 MG/2ML IJ SOLN
4.0000 mg | Freq: Once | INTRAMUSCULAR | Status: AC
  Administered 2019-08-10: 4 mg via INTRAVENOUS
  Filled 2019-08-10: qty 2

## 2019-08-10 MED ORDER — DIVALPROEX SODIUM 500 MG PO DR TAB
750.0000 mg | DELAYED_RELEASE_TABLET | Freq: Two times a day (BID) | ORAL | Status: DC
  Administered 2019-08-10 – 2019-08-12 (×5): 750 mg via ORAL
  Filled 2019-08-10 (×7): qty 1

## 2019-08-10 MED ORDER — FAMOTIDINE 20 MG PO TABS
20.0000 mg | ORAL_TABLET | Freq: Two times a day (BID) | ORAL | Status: DC
  Administered 2019-08-10 – 2019-08-12 (×5): 20 mg via ORAL
  Filled 2019-08-10 (×5): qty 1

## 2019-08-10 MED ORDER — PROPRANOLOL HCL 20 MG PO TABS
20.0000 mg | ORAL_TABLET | Freq: Two times a day (BID) | ORAL | Status: DC
  Administered 2019-08-10 – 2019-08-12 (×4): 20 mg via ORAL
  Filled 2019-08-10 (×6): qty 1

## 2019-08-10 MED ORDER — ONDANSETRON HCL 4 MG/2ML IJ SOLN: 4.0000 mg | Freq: Four times a day (QID) | INTRAMUSCULAR | Status: DC | PRN

## 2019-08-10 MED ORDER — ACETAMINOPHEN 650 MG RE SUPP: 650.0000 mg | Freq: Four times a day (QID) | RECTAL | Status: DC | PRN

## 2019-08-10 MED ORDER — TAMSULOSIN HCL 0.4 MG PO CAPS
0.4000 mg | ORAL_CAPSULE | Freq: Every day | ORAL | Status: DC
  Administered 2019-08-10 – 2019-08-12 (×3): 0.4 mg via ORAL
  Filled 2019-08-10 (×3): qty 1

## 2019-08-10 MED ORDER — ACETAMINOPHEN 325 MG PO TABS: 650.0000 mg | ORAL_TABLET | Freq: Four times a day (QID) | ORAL | Status: DC | PRN

## 2019-08-10 MED ORDER — SODIUM CHLORIDE 1 G PO TABS
1.0000 g | ORAL_TABLET | Freq: Three times a day (TID) | ORAL | Status: DC
  Administered 2019-08-10 – 2019-08-12 (×5): 1 g via ORAL
  Filled 2019-08-10 (×7): qty 1

## 2019-08-10 MED ORDER — ONDANSETRON HCL 4 MG PO TABS: 4.0000 mg | ORAL_TABLET | Freq: Four times a day (QID) | ORAL | Status: DC | PRN

## 2019-08-10 MED ORDER — SODIUM CHLORIDE 1 G PO TABS
1.0000 g | ORAL_TABLET | Freq: Two times a day (BID) | ORAL | Status: DC
  Administered 2019-08-10: 1 g via ORAL
  Filled 2019-08-10 (×2): qty 1

## 2019-08-10 NOTE — ED Triage Notes (Signed)
Per pt decreased urine output and n/v x 2 days. Also c/o decreased close vision. Pt has multiple vague complaints. VSS per EMS

## 2019-08-10 NOTE — H&P (Signed)
[name] is an 58 y.o. male.   Chief Complaint: Nausea and vomiting HPI: The patient with past medical history of hyponatremia and hypertension presents to the emergency department complaining of nausea and vomiting.  The patient reports nonbloody nonbilious emesis.  He is also concerned that he has had decreased urine output.  Laboratory evaluation significant for sodium of 124 which is worse than previously low serum sodium results.  Thus the emergency department staff call hospitalist service for further management.  Past Medical History:  Diagnosis Date  . Anemia   . GERD (gastroesophageal reflux disease)   . Hypertension   . Hyponatremia    chronic  . Kidney disease   . Migraine   . Seizures ([place])     Past Surgical History:  Procedure Laterality Date  . COLONOSCOPY WITH PROPOFOL N/A 05/01/2016   Procedure: COLONOSCOPY WITH PROPOFOL;  Surgeon: Lucilla Lame, MD;  Location: [facility] ENDOSCOPY;  Service: Endoscopy;  Laterality: N/A;  . ESOPHAGOGASTRODUODENOSCOPY (EGD) WITH PROPOFOL N/A 05/01/2016   Procedure: ESOPHAGOGASTRODUODENOSCOPY (EGD) WITH PROPOFOL;  Surgeon: Lucilla Lame, MD;  Location: [facility] ENDOSCOPY;  Service: Endoscopy;  Laterality: N/A;  . none      Family History  Problem Relation Age of Onset  . Heart attack Father   . Kidney disease Father    Social History:  reports that he has never smoked. He has never used smokeless tobacco. He reports that he does not drink alcohol or use drugs.  Allergies:  Allergies  Allergen Reactions  . Nsaids Other (See Comments)    Kidney disease    Medications Prior to Admission  Medication Sig Dispense Refill  . amoxicillin (AMOXIL) 500 MG capsule Take 1 capsule (500 mg total) by mouth 3 (three) times daily. 30 capsule 0  . azithromycin (ZITHROMAX Z-PAK) 250 MG tablet Take 2 tablets (500 mg) on  Day 1,  followed by 1 tablet (250 mg) once daily on Days 2 through 5. 6 each 0  . carbamazepine (TEGRETOL XR) 200 MG 12 hr tablet Take  1 tablet (200 mg total) by mouth 2 (two) times daily. 60 tablet 0  . carbamazepine (TEGRETOL) 200 MG tablet Take 200-400 mg by mouth See admin instructions. Take 2 tablets (400) mg by mouth every morning, Take 1 tablet by mouth at noon, and Take 2 tablets (400 mg) by mouth every night at bedtime.    . chlorpheniramine (CHLOR-TRIMETON) 4 MG tablet Take 1 tablet (4 mg total) by mouth 2 (two) times daily as needed for allergies or rhinitis. 30 tablet 0  . divalproex (DEPAKOTE) 250 MG DR tablet Take 3 tablets (750 mg total) by mouth every 12 (twelve) hours. (Patient taking differently: Take 750 mg by mouth 2 (two) times daily. ) 100 tablet 1  . famotidine (PEPCID) 20 MG tablet Take 1 tablet (20 mg total) by mouth 2 (two) times daily. (Patient not taking: Reported on 09/11/2016) 60 tablet 0  . feeding supplement, ENSURE ENLIVE, (ENSURE ENLIVE) LIQD Take 237 mLs by mouth 3 (three) times daily between meals. 237 mL 12  . lisinopril (PRINIVIL,ZESTRIL) 5 MG tablet Take 5 mg by mouth at bedtime.    . predniSONE (DELTASONE) 50 MG tablet Take 1 tablet by mouth daily 4 tablet 0  . propranolol (INDERAL) 20 MG tablet Take 20 mg by mouth 2 (two) times daily.    . propranolol (INDERAL) 40 MG tablet Take 1 tablet (40 mg total) by mouth 2 (two) times daily. 60 tablet 0  . tamsulosin (FLOMAX) 0.4  MG CAPS capsule Take 1 capsule (0.4 mg total) by mouth daily. 30 capsule 0    Results for orders placed or performed during the hospital encounter of 08/10/19 (from the past 48 hour(s))  CBC with Differential     Status: Abnormal   Collection Time: 08/10/19 12:53 AM  Result Value Ref Range   WBC 5.6 4.0 - 10.5 K/uL   RBC 3.60 (L) 4.22 - 5.81 MIL/uL   Hemoglobin 11.7 (L) 13.0 - 17.0 g/dL   HCT 32.5 (L) 39.0 - 52.0 %   MCV 90.3 80.0 - 100.0 fL   MCH 32.5 26.0 - 34.0 pg   MCHC 36.0 30.0 - 36.0 g/dL   RDW 12.9 11.5 - 15.5 %   Platelets 198 150 - 400 K/uL   nRBC 0.0 0.0 - 0.2 %   Neutrophils Relative % 60 %   Neutro Abs  3.4 1.7 - 7.7 K/uL   Lymphocytes Relative 30 %   Lymphs Abs 1.7 0.7 - 4.0 K/uL   Monocytes Relative 8 %   Monocytes Absolute 0.4 0.1 - 1.0 K/uL   Eosinophils Relative 1 %   Eosinophils Absolute 0.1 0.0 - 0.5 K/uL   Basophils Relative 0 %   Basophils Absolute 0.0 0.0 - 0.1 K/uL   Immature Granulocytes 1 %   Abs Immature Granulocytes 0.03 0.00 - 0.07 K/uL    Comment: Performed at [facility], [address] [place], [place] [address]  Comprehensive metabolic panel     Status: Abnormal   Collection Time: 08/10/19 12:53 AM  Result Value Ref Range   Sodium 124 (L) 135 - 145 mmol/L   Potassium 4.1 3.5 - 5.1 mmol/L   Chloride 89 (L) 98 - 111 mmol/L   CO2 21 (L) 22 - 32 mmol/L   Glucose, Bld 92 70 - 99 mg/dL   BUN 14 6 - 20 mg/dL   Creatinine, Ser 1.01 0.61 - 1.24 mg/dL   Calcium 8.9 8.9 - 10.3 mg/dL   Total Protein 6.9 6.5 - 8.1 g/dL   Albumin 4.2 3.5 - 5.0 g/dL   AST 24 15 - 41 U/L   ALT 23 0 - 44 U/L   Alkaline Phosphatase 29 (L) 38 - 126 U/L   Total Bilirubin 1.0 0.3 - 1.2 mg/dL   GFR calc non Af Amer >60 >60 mL/min   GFR calc Af Amer >60 >60 mL/min   Anion gap 14 5 - 15    Comment: Performed at [facility], [address] [place], [place] [address]   Dg Abd 2 Views  Result Date: 08/10/2019 CLINICAL DATA:  Vomiting EXAM: ABDOMEN - 2 VIEW COMPARISON:  None. FINDINGS: Lung bases are clear. No free air beneath the diaphragm. Nonobstructed bowel gas pattern with large amount of stool throughout the colon. No radiopaque calculi. IMPRESSION: Nonobstructed bowel gas pattern with large volume of stool in the colon Electronically Signed   By: Donavan Foil M.D.   On: 08/10/2019 02:18    Review of Systems  Constitutional: Negative for chills and fever.  HENT: Negative for sore throat and tinnitus.   Eyes: Negative for blurred vision and redness.  Respiratory: Negative for cough and shortness of breath.   Cardiovascular: Negative for chest pain, palpitations,  orthopnea and PND.  Gastrointestinal: Positive for nausea and vomiting. Negative for abdominal pain and diarrhea.  Genitourinary: Negative for dysuria, frequency and urgency.  Musculoskeletal: Negative for joint pain and myalgias.  Skin: Negative for rash.  No lesions  Neurological: Negative for speech change, focal weakness and weakness.  Endo/Heme/Allergies: Does not bruise/bleed easily.       No temperature intolerance  Psychiatric/Behavioral: Negative for depression and suicidal ideas.    Blood pressure 139/79, pulse 68, temperature 98.3 F (36.8 C), temperature source Oral, resp. rate 18, height 5\' 3"  (1.6 m), weight 68 kg, SpO2 100 %. Physical Exam  Vitals reviewed. Constitutional: He is oriented to person, place, and time. He appears well-developed and well-nourished. No distress.  HENT:  Head: Normocephalic and atraumatic.  Mouth/Throat: Oropharynx is clear and moist.  Eyes: Pupils are equal, round, and reactive to light. Conjunctivae and EOM are normal. No scleral icterus.  Neck: Normal range of motion. Neck supple. No JVD present. No tracheal deviation present. No thyromegaly present.  Cardiovascular: Normal rate, regular rhythm and normal heart sounds. Exam reveals no gallop and no friction rub.  No murmur heard. Respiratory: Breath sounds normal. No respiratory distress. He has no wheezes.  GI: Soft. Bowel sounds are normal. He exhibits no distension. There is no abdominal tenderness.  Genitourinary:    Genitourinary Comments: Deferred   Musculoskeletal: Normal range of motion.        General: No edema.  Lymphadenopathy:    He has no cervical adenopathy.  Neurological: He is alert and oriented to person, place, and time. No cranial nerve deficit.  Skin: Skin is warm and dry. No rash noted. No erythema.  Psychiatric: He has a normal mood and affect. His behavior is normal. Judgment and thought content normal.     Assessment/Plan This is a 57 year old male  admitted for hyponatremia. 1.  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  I have ordered a psychiatry consult and placed the patient on a fluid restriction. 2.  Nausea and vomiting: Symptomatic relief; continue antiemetics as needed. 3.  Hypertension: Controlled; continue anti-hypertensives per home regimen once medications verified by pharmacy 4.  BPH: Continue tamsulosin 5.  DVT prophylaxis: Lovenox 6.  GI prophylaxis: None The patient is a full code.  Time spent on admission orders and patient care approximately 45 minutes   Harrie Foreman, MD 08/10/2019, 2:40 AM

## 2019-08-10 NOTE — ED Notes (Signed)
ED TO INPATIENT HANDOFF REPORT  ED Nurse Name and Phone #:  Anson Crofts Name/Age/Gender [name] 58 y.o. male Room/Bed: ED02A/ED02A  Code Status   Code Status: Prior  Home/SNF/Other Home Patient oriented to: self, place, time and situation Is this baseline? Yes   Triage Complete: Triage complete  Chief Complaint [facility] - Nausea  Triage Note Per pt decreased urine output and n/v x 2 days. Also c/o decreased close vision. Pt has multiple vague complaints. VSS per EMS   Allergies Allergies  Allergen Reactions  . Nsaids Other (See Comments)    Kidney disease    Level of Care/Admitting Diagnosis ED Disposition    ED Disposition Condition [facility] Area: [place] [100120]  Level of Care: Med-Surg [16]  Covid Evaluation: Asymptomatic Screening Protocol (No Symptoms)  Diagnosis: Hyponatremia [id number]  Admitting Physician: Harrie Foreman [id number]  Attending Physician: Harrie Foreman [id number]  Estimated length of stay: past midnight tomorrow  Certification:: I certify this patient will need inpatient services for at least 2 midnights  PT Class (Do Not Modify): Inpatient [101]  PT Acc Code (Do Not Modify): Private [1]       B Medical/Surgery History Past Medical History:  Diagnosis Date  . Anemia   . GERD (gastroesophageal reflux disease)   . Hypertension   . Hyponatremia    chronic  . Kidney disease   . Migraine   . Seizures ([place])    Past Surgical History:  Procedure Laterality Date  . COLONOSCOPY WITH PROPOFOL N/A 05/01/2016   Procedure: COLONOSCOPY WITH PROPOFOL;  Surgeon: Lucilla Lame, MD;  Location: [facility] ENDOSCOPY;  Service: Endoscopy;  Laterality: N/A;  . ESOPHAGOGASTRODUODENOSCOPY (EGD) WITH PROPOFOL N/A 05/01/2016   Procedure: ESOPHAGOGASTRODUODENOSCOPY (EGD) WITH PROPOFOL;  Surgeon: Lucilla Lame, MD;  Location: [facility] ENDOSCOPY;  Service: Endoscopy;  Laterality: N/A;  . none       A IV  Location/Drains/Wounds Patient Lines/Drains/Airways Status   Active Line/Drains/Airways    Name:   Placement date:   Placement time:   Site:   Days:   Peripheral IV 08/10/19 Left Antecubital   08/10/19    0050    Antecubital   less than 1          Intake/Output Last 24 hours  Intake/Output Summary (Last 24 hours) at 08/10/2019 0254 Last data filed at 08/10/2019 0206 Gross per 24 hour  Intake 500 ml  Output -  Net 500 ml    Labs/Imaging Results for orders placed or performed during the hospital encounter of 08/10/19 (from the past 48 hour(s))  CBC with Differential     Status: Abnormal   Collection Time: 08/10/19 12:53 AM  Result Value Ref Range   WBC 5.6 4.0 - 10.5 K/uL   RBC 3.60 (L) 4.22 - 5.81 MIL/uL   Hemoglobin 11.7 (L) 13.0 - 17.0 g/dL   HCT 32.5 (L) 39.0 - 52.0 %   MCV 90.3 80.0 - 100.0 fL   MCH 32.5 26.0 - 34.0 pg   MCHC 36.0 30.0 - 36.0 g/dL   RDW 12.9 11.5 - 15.5 %   Platelets 198 150 - 400 K/uL   nRBC 0.0 0.0 - 0.2 %   Neutrophils Relative % 60 %   Neutro Abs 3.4 1.7 - 7.7 K/uL   Lymphocytes Relative 30 %   Lymphs Abs 1.7 0.7 - 4.0 K/uL   Monocytes Relative 8 %   Monocytes Absolute 0.4 0.1 - 1.0 K/uL  Eosinophils Relative 1 %   Eosinophils Absolute 0.1 0.0 - 0.5 K/uL   Basophils Relative 0 %   Basophils Absolute 0.0 0.0 - 0.1 K/uL   Immature Granulocytes 1 %   Abs Immature Granulocytes 0.03 0.00 - 0.07 K/uL    Comment: Performed at [facility], [address] [place], [place] [address]  Comprehensive metabolic panel     Status: Abnormal   Collection Time: 08/10/19 12:53 AM  Result Value Ref Range   Sodium 124 (L) 135 - 145 mmol/L   Potassium 4.1 3.5 - 5.1 mmol/L   Chloride 89 (L) 98 - 111 mmol/L   CO2 21 (L) 22 - 32 mmol/L   Glucose, Bld 92 70 - 99 mg/dL   BUN 14 6 - 20 mg/dL   Creatinine, Ser 1.01 0.61 - 1.24 mg/dL   Calcium 8.9 8.9 - 10.3 mg/dL   Total Protein 6.9 6.5 - 8.1 g/dL   Albumin 4.2 3.5 - 5.0 g/dL   AST 24 15 - 41 U/L    ALT 23 0 - 44 U/L   Alkaline Phosphatase 29 (L) 38 - 126 U/L   Total Bilirubin 1.0 0.3 - 1.2 mg/dL   GFR calc non Af Amer >60 >60 mL/min   GFR calc Af Amer >60 >60 mL/min   Anion gap 14 5 - 15    Comment: Performed at [facility], [address], [place], [place] [address]   Dg Abd 2 Views  Result Date: 08/10/2019 CLINICAL DATA:  Vomiting EXAM: ABDOMEN - 2 VIEW COMPARISON:  None. FINDINGS: Lung bases are clear. No free air beneath the diaphragm. Nonobstructed bowel gas pattern with large amount of stool throughout the colon. No radiopaque calculi. IMPRESSION: Nonobstructed bowel gas pattern with large volume of stool in the colon Electronically Signed   By: Donavan Foil M.D.   On: 08/10/2019 02:18    Pending Labs Unresulted Labs (From admission, onward)    Start     Ordered   08/10/19 0146  SARS CORONAVIRUS 2 Nasal Swab Aptima Multi Swab  (Asymptomatic/Tier 2 Patients Labs)  ONCE - STAT,   STAT    Question Answer Comment  Is this test for diagnosis or screening Screening   Symptomatic for COVID-19 as defined by CDC No   Hospitalized for COVID-19 No   Admitted to ICU for COVID-19 No   Previously tested for COVID-19 No   Resident in a congregate (group) care setting No   Employed in healthcare setting No      08/10/19 0145   Signed and Held  Creatinine, serum  (enoxaparin (LOVENOX)    CrCl >/= 30 ml/min)  Weekly,   R    Comments: while on enoxaparin therapy    Signed and Held   Signed and Held  TSH  Add-on,   R     Signed and Held   Signed and Held  Hemoglobin A1c  Add-on,   R     Signed and Held   Signed and Held  Sodium  Once,   R     Signed and Held   Signed and Held  Basic metabolic panel  Tomorrow morning,   R     Signed and Held          Vitals/Pain Today's Vitals   08/10/19 0048 08/10/19 0049 08/10/19 0208 08/10/19 0209  BP: (!) 148/89  139/79   Pulse: 68  68   Resp: 19  18   Temp: 98.3 F (36.8 C)  TempSrc: Oral     SpO2: 100%  100%   Weight:   68 kg    Height:  5\' 3"  (1.6 m)    PainSc:  0-No pain 0-No pain 0-No pain    Isolation Precautions No active isolations  Medications Medications  sodium chloride 0.9 % bolus 500 mL (0 mLs Intravenous Stopped 08/10/19 0206)  ondansetron (ZOFRAN) injection 4 mg (4 mg Intravenous Given 08/10/19 0131)    Mobility Walks (uses walker at times) Low fall risk   Focused Assessments Cardiac Assessment Handoff:    Lab Results  Component Value Date   CKTOTAL 61 11/27/2013   TROPONINI <0.03 12/20/2018   No results found for: DDIMER Does the Patient currently have chest pain? No     R Recommendations: See Admitting Provider Note  Report given to:   Additional Notes:

## 2019-08-10 NOTE — ED Provider Notes (Signed)
[facility] Emergency Department Provider Note   ____________________________________________   First MD Initiated Contact with Patient 08/10/19 0120     (approximate)  I have reviewed the triage vital signs and the nursing notes.   HISTORY  Chief Complaint Nausea and Emesis    HPI [name] is a 58 y.o. male history of chronic kidney disease, migraines, previous seizures, hyponatremia  Patient ports yesterday he does not quite feel well he vomited several times in the morning.  Not eaten well throughout the day.  Feels tired fatigue.  Slight blue this vision.  Reports somewhat similar symptoms has been hospitalized before in the past   No chest pain no fevers or chills.  No exposure COVID.  Lives with his cat.  Reports symptoms started a little over 24 hours ago.  He has not been able to keep any on his stomach because and every eats he starts to feel nauseated.  Denies having any abdominal pain.  Past Medical History:  Diagnosis Date  . Anemia   . GERD (gastroesophageal reflux disease)   . Hypertension   . Hyponatremia    chronic  . Kidney disease   . Migraine   . Seizures [facility])     Patient Active Problem List   Diagnosis Date Noted  . Iron deficiency anemia, unspecified   . Benign neoplasm of ascending colon   . Heartburn   . Gastritis   . Altered mental state   . Acute delirium 10/31/2015  . Fall   . Lightheadedness   . Laceration of scalp   . Near syncope 10/27/2015  . Poverty 10/27/2015  . Symptomatic anemia   . Early satiety   . FTT (failure to thrive) in adult   . Protein-calorie malnutrition, severe ([place]) 09/07/2015  . Hyponatremia 09/05/2015  . Elevated troponin 09/05/2015  . Hypokalemia 09/05/2015  . Leukopenia 09/05/2015  . Renal insufficiency 09/05/2015  . Tremor, essential 07/15/2015  . Essential hypertension 07/15/2015  . Chronic renal insufficiency 07/15/2015  . Leg swelling 07/15/2015  . Partial epilepsy  with impairment of consciousness ([place]) 05/19/2015  . Chronic kidney disease (CKD), stage III (moderate) ([place]) 05/19/2015  . Absolute anemia 05/19/2015  . Chest pain 04/17/2015    Past Surgical History:  Procedure Laterality Date  . COLONOSCOPY WITH PROPOFOL N/A 05/01/2016   Procedure: COLONOSCOPY WITH PROPOFOL;  Surgeon: Lucilla Lame, MD;  Location: [facility] ENDOSCOPY;  Service: Endoscopy;  Laterality: N/A;  . ESOPHAGOGASTRODUODENOSCOPY (EGD) WITH PROPOFOL N/A 05/01/2016   Procedure: ESOPHAGOGASTRODUODENOSCOPY (EGD) WITH PROPOFOL;  Surgeon: Lucilla Lame, MD;  Location: [facility] ENDOSCOPY;  Service: Endoscopy;  Laterality: N/A;  . none      Prior to Admission medications   Medication Sig Start Date End Date Taking? Authorizing Provider  amoxicillin (AMOXIL) 500 MG capsule Take 1 capsule (500 mg total) by mouth 3 (three) times daily. 12/20/18   Earleen Newport, MD  azithromycin (ZITHROMAX Z-PAK) 250 MG tablet Take 2 tablets (500 mg) on  Day 1,  followed by 1 tablet (250 mg) once daily on Days 2 through 5. 12/16/16   Beers, Pierce Crane, PA-C  carbamazepine (TEGRETOL XR) 200 MG 12 hr tablet Take 1 tablet (200 mg total) by mouth 2 (two) times daily. 11/18/15   Vaughan Basta, MD  carbamazepine (TEGRETOL) 200 MG tablet Take 200-400 mg by mouth See admin instructions. Take 2 tablets (400) mg by mouth every morning, Take 1 tablet by mouth at noon, and Take 2 tablets (400 mg) by mouth  every night at bedtime.    [provider]  chlorpheniramine (CHLOR-TRIMETON) 4 MG tablet Take 1 tablet (4 mg total) by mouth 2 (two) times daily as needed for allergies or rhinitis. 12/16/16   Beers, Pierce Crane, PA-C  divalproex (DEPAKOTE) 250 MG DR tablet Take 3 tablets (750 mg total) by mouth every 12 (twelve) hours. Patient taking differently: Take 750 mg by mouth 2 (two) times daily.  11/18/15   Vaughan Basta, MD  famotidine (PEPCID) 20 MG tablet Take 1 tablet (20 mg total) by mouth 2 (two) times daily.  Patient not taking: Reported on 09/11/2016 04/29/16   Carrie Mew, MD  feeding supplement, ENSURE ENLIVE, (ENSURE ENLIVE) LIQD Take 237 mLs by mouth 3 (three) times daily between meals. 09/15/16   Hower, Aaron Mose, MD  lisinopril (PRINIVIL,ZESTRIL) 5 MG tablet Take 5 mg by mouth at bedtime.    [provider]  predniSONE (DELTASONE) 50 MG tablet Take 1 tablet by mouth daily 12/20/18   Earleen Newport, MD  propranolol (INDERAL) 20 MG tablet Take 20 mg by mouth 2 (two) times daily.    [provider]  propranolol (INDERAL) 40 MG tablet Take 1 tablet (40 mg total) by mouth 2 (two) times daily. 11/18/15   Vaughan Basta, MD  tamsulosin (FLOMAX) 0.4 MG CAPS capsule Take 1 capsule (0.4 mg total) by mouth daily. 09/16/16   Hower, Aaron Mose, MD    Allergies Nsaids  Family History  Problem Relation Age of Onset  . Heart attack Father   . Kidney disease Father     Social History Social History   Tobacco Use  . Smoking status: Never Smoker  . Smokeless tobacco: Never Used  Substance Use Topics  . Alcohol use: No  . Drug use: No    Review of Systems Constitutional: No fever/chills Eyes: No visual changes. ENT: No sore throat. Cardiovascular: Denies chest pain. Respiratory: Denies shortness of breath. Gastrointestinal: No abdominal pain.  Some nausea and vomiting when he tries to keep food down. Genitourinary: Negative for dysuria. Musculoskeletal: Negative for back pain. Skin: Negative for rash. Neurological: Negative for headaches, areas of focal weakness or numbness.  Felt like his vision just seemed a little bit off or fuzzy in both eyes today    ____________________________________________   PHYSICAL EXAM:  VITAL SIGNS: ED Triage Vitals  Enc Vitals Group     BP 08/10/19 0048 (!) 148/89     Pulse Rate 08/10/19 0048 68     Resp 08/10/19 0048 19     Temp 08/10/19 0048 98.3 F (36.8 C)     Temp Source 08/10/19 0048 Oral     SpO2 08/10/19 0048  100 %     Weight 08/10/19 0049 150 lb (68 kg)     Height 08/10/19 0049 5\' 3"  (1.6 m)     Head Circumference --      Peak Flow --      Pain Score 08/10/19 0049 0     Pain Loc --      Pain Edu? --      Excl. in [place]? --     Constitutional: Alert and oriented. Well appearing and in no acute distress.  Very pleasant. Eyes: Conjunctivae are normal. Head: Atraumatic. Nose: No congestion/rhinnorhea. Mouth/Throat: Mucous membranes are modestly dry. Neck: No stridor.  Cardiovascular: Normal rate, regular rhythm. Grossly normal heart sounds.  Good peripheral circulation. Respiratory: Normal respiratory effort.  No retractions. Lungs CTAB. Gastrointestinal: Soft and nontender. No distention.  No pain  or discomfort in any quadrant.  Denies abdominal pain. Musculoskeletal: No lower extremity tenderness nor edema. Neurologic:  Normal speech and language. No gross focal neurologic deficits are appreciated.  Skin:  Skin is warm, dry and intact. No rash noted. Psychiatric: Mood and affect are normal. Speech and behavior are normal.  ____________________________________________   LABS (all labs ordered are listed, but only abnormal results are displayed)  Labs Reviewed  CBC WITH DIFFERENTIAL/PLATELET - Abnormal; Notable for the following components:      Result Value   RBC 3.60 (*)    Hemoglobin 11.7 (*)    HCT 32.5 (*)    All other components within normal limits  COMPREHENSIVE METABOLIC PANEL - Abnormal; Notable for the following components:   Sodium 124 (*)    Chloride 89 (*)    CO2 21 (*)    Alkaline Phosphatase 29 (*)    All other components within normal limits  SARS CORONAVIRUS 2   ____________________________________________  EKG   ____________________________________________  RADIOLOGY  Dg Abd 2 Views  Result Date: 08/10/2019 CLINICAL DATA:  Vomiting EXAM: ABDOMEN - 2 VIEW COMPARISON:  None. FINDINGS: Lung bases are clear. No free air beneath the diaphragm. Nonobstructed  bowel gas pattern with large amount of stool throughout the colon. No radiopaque calculi. IMPRESSION: Nonobstructed bowel gas pattern with large volume of stool in the colon Electronically Signed   By: Donavan Foil M.D.   On: 08/10/2019 02:18    Imaging reviewed negative for obstruction ____________________________________________   PROCEDURES  Procedure(s) performed: None  Procedures  Critical Care performed: No  ____________________________________________   INITIAL IMPRESSION / ASSESSMENT AND PLAN / ED COURSE  Pertinent labs & imaging results that were available during my care of the patient were reviewed by me and considered in my medical decision making (see chart for details).   Patient presents for nausea vomiting few times this earlier today.  Alert and oriented, hemodynamically stable, appears mildly volume down by exam with dry mucous membranes and does report of the decreased oral intake.  Reassuring abdominal exam without signs of acute abdomen or focal pain or discomfort.  Clinical history he does have a history of nausea and hyponatremia in the past.  Lab work today reveals hyponatremia, he reports taking salt tabs and staying hydrated at home does appear volume down.  Will hydrate with 500 mL's normal saline here, because of his symptoms including nausea hyponatremia and what he describes a slight blurriness or fuzziness of his vision associated as well as fatigue will admit for further care and work-up under the hospitalist service.  Case discussed with Dr. Jannifer Franklin.  JUDEA FANG was evaluated in Emergency Department on 08/10/2019 for the symptoms described in the history of present illness. He was evaluated in the context of the global COVID-19 pandemic, which necessitated consideration that the patient might be at risk for infection with the SARS-CoV-2 virus that causes COVID-19. Institutional protocols and algorithms that pertain to the evaluation of patients at risk for  COVID-19 are in a state of rapid change based on information released by regulatory bodies including the CDC and federal and state organizations. These policies and algorithms were followed during the patient's care in the ED.  No noted covid symptoms.  Patient agreeable with plan for admission understanding.      ____________________________________________   FINAL CLINICAL IMPRESSION(S) / ED DIAGNOSES  Final diagnoses:  Nausea and vomiting, intractability of vomiting not specified, unspecified vomiting type  Hyponatremia  Note:  This document was prepared using Systems analyst and may include unintentional dictation errors       Delman Kitten, MD 08/10/19 [id number]

## 2019-08-10 NOTE — ED Notes (Signed)
Patient transported to X-ray 

## 2019-08-10 NOTE — Progress Notes (Signed)
[place] at [facility]                                                                                                                                                                                  Patient Demographics   [name], is a 58 y.o. male, DOB - [birth year], [id number]  Admit date - 08/10/2019   Admitting Physician Harrie Foreman, MD  Outpatient Primary MD for the patient is [name], MD   LOS - 0  Subjective: Patient states that he drinks a lot of water since he was admitted last time he was told that his kidney function was high because of dehydration,     Review of Systems:   CONSTITUTIONAL: No documented fever. No fatigue, weakness. No weight gain, no weight loss.  EYES: No blurry or double vision.  ENT: No tinnitus. No postnasal drip. No redness of the oropharynx.  RESPIRATORY: No cough, no wheeze, no hemoptysis. No dyspnea.  CARDIOVASCULAR: No chest pain. No orthopnea. No palpitations. No syncope.  GASTROINTESTINAL: No nausea, no vomiting or diarrhea. No abdominal pain. No melena or hematochezia.  GENITOURINARY: No dysuria or hematuria.  ENDOCRINE: No polyuria or nocturia. No heat or cold intolerance.  HEMATOLOGY: No anemia. No bruising. No bleeding.  INTEGUMENTARY: No rashes. No lesions.  MUSCULOSKELETAL: No arthritis. No swelling. No gout.  NEUROLOGIC: No numbness, tingling, or ataxia. No seizure-type activity.  PSYCHIATRIC: No anxiety. No insomnia. No ADD.    Vitals:   Vitals:   08/10/19 0208 08/10/19 0335 08/10/19 0341 08/10/19 1219  BP: 139/79  128/73 122/72  Pulse: 68  69 62  Resp: 18  16 20   Temp:   98.5 F (36.9 C) 97.7 F (36.5 C)  TempSrc:   Oral Oral  SpO2: 100%  100% 100%  Weight:  62.3 kg    Height:  5\' 3"  (1.6 m)      Wt Readings from Last 3 Encounters:  08/10/19 62.3 kg  12/20/18 53.5 kg  07/31/18 64.4 kg     Intake/Output Summary (Last 24 hours) at 08/10/2019 1405 Last data filed  at 08/10/2019 1028 Gross per 24 hour  Intake 740 ml  Output -  Net 740 ml    Physical Exam:   GENERAL: Pleasant-appearing in no apparent distress.  HEAD, EYES, EARS, NOSE AND THROAT: Atraumatic, normocephalic. Extraocular muscles are intact. Pupils equal and reactive to light. Sclerae anicteric. No conjunctival injection. No oro-pharyngeal erythema.  NECK: Supple. There is no jugular venous distention. No bruits, no lymphadenopathy, no thyromegaly.  HEART: Regular rate and rhythm,. No murmurs, no rubs, no clicks.  LUNGS: Clear to auscultation bilaterally. No rales or rhonchi. No  wheezes.  ABDOMEN: Soft, flat, nontender, nondistended. Has good bowel sounds. No hepatosplenomegaly appreciated.  EXTREMITIES: No evidence of any cyanosis, clubbing, or peripheral edema.  +2 pedal and radial pulses bilaterally.  NEUROLOGIC: The patient is alert, awake, and oriented x3 with no focal motor or sensory deficits appreciated bilaterally.  SKIN: Moist and warm with no rashes appreciated.  Psych: Not anxious, depressed LN: No inguinal LN enlargement    Antibiotics   Anti-infectives (From admission, onward)   None      Medications   Scheduled Meds: . carbamazepine  200 mg Oral Q1200  . carbamazepine  400 mg Oral BID  . divalproex  750 mg Oral BID  . docusate sodium  100 mg Oral BID  . enoxaparin (LOVENOX) injection  40 mg Subcutaneous QHS  . famotidine  20 mg Oral BID  . propranolol  20 mg Oral BID  . sodium chloride  1 g Oral TID WC  . tamsulosin  0.4 mg Oral Daily   Continuous Infusions: PRN Meds:.acetaminophen **OR** acetaminophen, ondansetron **OR** ondansetron (ZOFRAN) IV   Data Review:   Micro Results Recent Results (from the past 240 hour(s))  SARS CORONAVIRUS 2 Nasal Swab Aptima Multi Swab     Status: None   Collection Time: 08/10/19  2:01 AM   Specimen: Aptima Multi Swab; Nasal Swab  Result Value Ref Range Status   SARS Coronavirus 2 NEGATIVE NEGATIVE Final    Comment:  (NOTE) SARS-CoV-2 target nucleic acids are NOT DETECTED. The SARS-CoV-2 RNA is generally detectable in upper and lower respiratory specimens during the acute phase of infection. Negative results do not preclude SARS-CoV-2 infection, do not rule out co-infections with other pathogens, and should not be used as the sole basis for treatment or other patient management decisions. Negative results must be combined with clinical observations, patient history, and epidemiological information. The expected result is Negative. Fact Sheet for Patients: SugarRoll.be Fact Sheet for Healthcare Providers: https://www.woods-mathews.com/ This test is not yet approved or cleared by the [place] FDA and  has been authorized for detection and/or diagnosis of SARS-CoV-2 by FDA under an Emergency Use Authorization (EUA). This EUA will remain  in effect (meaning this test can be used) for the duration of the COVID-19 declaration under Section 56 4(b)(1) of the Act, 21 U.S.C. section 360bbb-3(b)(1), unless the authorization is terminated or revoked sooner. Performed at [facility], [place] [address], [place], [place] [address]     Radiology Reports Dg Abd 2 Views  Result Date: 08/10/2019 CLINICAL DATA:  Vomiting EXAM: ABDOMEN - 2 VIEW COMPARISON:  None. FINDINGS: Lung bases are clear. No free air beneath the diaphragm. Nonobstructed bowel gas pattern with large amount of stool throughout the colon. No radiopaque calculi. IMPRESSION: Nonobstructed bowel gas pattern with large volume of stool in the colon Electronically Signed   By: Donavan Foil M.D.   On: 08/10/2019 02:18     CBC Recent Labs  Lab 08/10/19 0053  WBC 5.6  HGB 11.7*  HCT 32.5*  PLT 198  MCV 90.3  MCH 32.5  MCHC 36.0  RDW 12.9  LYMPHSABS 1.7  MONOABS 0.4  EOSABS 0.1  BASOSABS 0.0    Chemistries  Recent Labs  Lab 08/10/19 0053 08/10/19 1225  NA 124* 125*  K 4.1  --   CL  89*  --   CO2 21*  --   GLUCOSE 92  --   BUN 14  --   CREATININE 1.01  --   CALCIUM 8.9  --  AST 24  --   ALT 23  --   ALKPHOS 29*  --   BILITOT 1.0  --    ------------------------------------------------------------------------------------------------------------------ estimated creatinine clearance is 62.6 mL/min (by C-G formula based on SCr of 1.01 mg/dL). ------------------------------------------------------------------------------------------------------------------ Recent Labs    08/10/19 0053  HGBA1C 5.6   ------------------------------------------------------------------------------------------------------------------ No results for input(s): CHOL, HDL, LDLCALC, TRIG, CHOLHDL, LDLDIRECT in the last 72 hours. ------------------------------------------------------------------------------------------------------------------ Recent Labs    08/10/19 0053  TSH 1.344   ------------------------------------------------------------------------------------------------------------------ No results for input(s): VITAMINB12, FOLATE, FERRITIN, TIBC, IRON, RETICCTPCT in the last 72 hours.  Coagulation profile No results for input(s): INR, PROTIME in the last 168 hours.  No results for input(s): DDIMER in the last 72 hours.  Cardiac Enzymes No results for input(s): CKMB, TROPONINI, MYOGLOBIN in the last 168 hours.  Invalid input(s): CK ------------------------------------------------------------------------------------------------------------------ Invalid input(s): [place]   This is a 58 year old male admitted for hyponatremia. 1.  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    Psych consult pending continue fluid restriction 2.  Nausea and vomiting: Symptomatic relief; continue antiemetics as needed. 3.  Hypertension: Controlled; continue anti-hypertensives per home regimen once medications  verified by pharmacy 4.  BPH: Continue tamsulosin 5.  DVT prophylaxis: Lovenox 6.  GI prophylaxis: None      Code Status Orders  (From admission, onward)         Start     Ordered   08/10/19 0334  Full code  Continuous     08/10/19 0333        Code Status History    Date Active Date Inactive Code Status Order ID Comments User Context   09/11/2016 1639 09/15/2016 1427 Full Code [id number]  Vaughan Basta, MD Inpatient   10/27/2015 1157 11/18/2015 1732 Full Code [id number]  Nicholes Mango, MD Inpatient   10/19/2015 1546 10/21/2015 2155 Full Code [id number]  Dustin Flock, MD Inpatient   09/06/2015 0026 09/07/2015 1754 Full Code [id number]  Theodoro Grist, MD Inpatient   04/17/2015 1604 04/18/2015 1906 Full Code [id number]  Fritzi Mandes, MD ED   Advance Care Planning Activity    Advance Directive Documentation     Most Recent Value  Type of Advance Directive  Healthcare Power of Attorney  Pre-existing out of facility DNR order (yellow form or pink MOST form)  -  "MOST" Form in Place?  -           Consults psychiatry  DVT Prophylaxis  Lovenox   Lab Results  Component Value Date   PLT 198 08/10/2019     Time Spent in minutes   45 minutes spent from 12 PM to 12:45 PM greater than 50% of time spent in care coordination and counseling patient regarding the condition and plan of care.   Dustin Flock M.D on 08/10/2019 at 2:05 PM  Between 7am to 6pm - Pager - [phone]  After 6pm go to [email or web address] - Proofreader  [employer]   Office  [phone]

## 2019-08-11 DIAGNOSIS — R631 Polydipsia: Secondary | ICD-10-CM

## 2019-08-11 DIAGNOSIS — Z008 Encounter for other general examination: Secondary | ICD-10-CM

## 2019-08-11 DIAGNOSIS — E871 Hypo-osmolality and hyponatremia: Principal | ICD-10-CM

## 2019-08-11 LAB — BASIC METABOLIC PANEL
Anion gap: 8 (ref 5–15)
BUN: 18 mg/dL (ref 6–20)
CO2: 25 mmol/L (ref 22–32)
Calcium: 8.5 mg/dL — ABNORMAL LOW (ref 8.9–10.3)
Chloride: 94 mmol/L — ABNORMAL LOW (ref 98–111)
Creatinine, Ser: 1.21 mg/dL (ref 0.61–1.24)
GFR calc Af Amer: >60 mL/min (ref >60)
GFR calc non Af Amer: >60 mL/min (ref >60)
Glucose, Bld: 106 mg/dL — ABNORMAL HIGH (ref 70–99)
Potassium: 4.6 mmol/L (ref 3.5–5.1)
Sodium: 127 mmol/L — ABNORMAL LOW (ref 135–145)

## 2019-08-11 MED ORDER — SODIUM CHLORIDE 0.9 % IV SOLN
INTRAVENOUS | Status: DC
  Administered 2019-08-11 (×2): via INTRAVENOUS

## 2019-08-11 NOTE — Progress Notes (Signed)
[place] [facility]                                                                                                                                                                                  Patient Demographics   [name], is a 58 y.o. male, DOB - 01-30-1961, [id number]  Admit date - 08/10/2019   Admitting Physician Harrie Foreman, MD  Outpatient Primary MD for the patient is Devin Pitch, MD   LOS - 1  Subjective: Patient states that he drinks a lot of water since he was admitted last time he was told that his kidney function was high because of dehydration,     Review of Systems:   CONSTITUTIONAL: No documented fever. No fatigue, weakness. No weight gain, no weight loss.  EYES: No blurry or double vision.  ENT: No tinnitus. No postnasal drip. No redness of the oropharynx.  RESPIRATORY: No cough, no wheeze, no hemoptysis. No dyspnea.  CARDIOVASCULAR: No chest pain. No orthopnea. No palpitations. No syncope.  GASTROINTESTINAL: No nausea, no vomiting or diarrhea. No abdominal pain. No melena or hematochezia.  GENITOURINARY: No dysuria or hematuria.  ENDOCRINE: No polyuria or nocturia. No heat or cold intolerance.  HEMATOLOGY: No anemia. No bruising. No bleeding.  INTEGUMENTARY: No rashes. No lesions.  MUSCULOSKELETAL: No arthritis. No swelling. No gout.  NEUROLOGIC: No numbness, tingling, or ataxia. No seizure-type activity.  PSYCHIATRIC: No anxiety. No insomnia. No ADD.    Vitals:   Vitals:   08/10/19 1219 08/10/19 1943 08/11/19 0404 08/11/19 0952  BP: 122/72 97/65 114/69   Pulse: 62 (!) 58 61 74  Resp: 20 18 18    Temp: 97.7 F (36.5 C) 98.4 F (36.9 C) 98.6 F (37 C)   TempSrc: Oral Oral Oral   SpO2: 100% 100% 100%   Weight:      Height:        Wt Readings from Last 3 Encounters:  08/10/19 62.3 kg  12/20/18 53.5 kg  07/31/18 64.4 kg     Intake/Output Summary (Last 24 hours) at 08/11/2019 1333 Last data  filed at 08/11/2019 1100 Gross per 24 hour  Intake 240 ml  Output 450 ml  Net -210 ml    Physical Exam:   GENERAL: Pleasant-appearing in no apparent distress.  HEAD, EYES, EARS, NOSE AND THROAT: Atraumatic, normocephalic. Extraocular muscles are intact. Pupils equal and reactive to light. Sclerae anicteric. No conjunctival injection. No oro-pharyngeal erythema.  NECK: Supple. There is no jugular venous distention. No bruits, no lymphadenopathy, no thyromegaly.  HEART: Regular rate and rhythm,. No murmurs, no rubs, no clicks.  LUNGS: Clear to auscultation bilaterally. No rales or rhonchi.  No wheezes.  ABDOMEN: Soft, flat, nontender, nondistended. Has good bowel sounds. No hepatosplenomegaly appreciated.  EXTREMITIES: No evidence of any cyanosis, clubbing, or peripheral edema.  +2 pedal and radial pulses bilaterally.  NEUROLOGIC: The patient is alert, awake, and oriented x3 with no focal motor or sensory deficits appreciated bilaterally.  SKIN: Moist and warm with no rashes appreciated.  Psych: Not anxious, depressed LN: No inguinal LN enlargement    Antibiotics   Anti-infectives (From admission, onward)   None      Medications   Scheduled Meds: . carbamazepine  200 mg Oral Q1200  . carbamazepine  400 mg Oral BID  . divalproex  750 mg Oral BID  . docusate sodium  100 mg Oral BID  . enoxaparin (LOVENOX) injection  40 mg Subcutaneous QHS  . famotidine  20 mg Oral BID  . propranolol  20 mg Oral BID  . sodium chloride  1 g Oral TID WC  . tamsulosin  0.4 mg Oral Daily   Continuous Infusions: . sodium chloride 75 mL/hr at 08/11/19 1008   PRN Meds:.acetaminophen **OR** acetaminophen, ondansetron **OR** ondansetron (ZOFRAN) IV   Data Review:   Micro Results Recent Results (from the past 240 hour(s))  SARS CORONAVIRUS 2 Nasal Swab Aptima Multi Swab     Status: None   Collection Time: 08/10/19  2:01 AM   Specimen: Aptima Multi Swab; Nasal Swab  Result Value Ref Range Status    SARS Coronavirus 2 NEGATIVE NEGATIVE Final    Comment: (NOTE) SARS-CoV-2 target nucleic acids are NOT DETECTED. The SARS-CoV-2 RNA is generally detectable in upper and lower respiratory specimens during the acute phase of infection. Negative results do not preclude SARS-CoV-2 infection, do not rule out co-infections with other pathogens, and should not be used as the sole basis for treatment or other patient management decisions. Negative results must be combined with clinical observations, patient history, and epidemiological information. The expected result is Negative. Fact Sheet for Patients: SugarRoll.be Fact Sheet for Healthcare Providers: https://www.woods-mathews.com/ This test is not yet approved or cleared by the [place] FDA and  has been authorized for detection and/or diagnosis of SARS-CoV-2 by FDA under an Emergency Use Authorization (EUA). This EUA will remain  in effect (meaning this test can be used) for the duration of the COVID-19 declaration under Section 56 4(b)(1) of the Act, 21 U.S.C. section 360bbb-3(b)(1), unless the authorization is terminated or revoked sooner. Performed at [facility], [place] [address], [place], [place] [address]     Radiology Reports Dg Abd 2 Views  Result Date: 08/10/2019 CLINICAL DATA:  Vomiting EXAM: ABDOMEN - 2 VIEW COMPARISON:  None. FINDINGS: Lung bases are clear. No free air beneath the diaphragm. Nonobstructed bowel gas pattern with large amount of stool throughout the colon. No radiopaque calculi. IMPRESSION: Nonobstructed bowel gas pattern with large volume of stool in the colon Electronically Signed   By: Donavan Foil M.D.   On: 08/10/2019 02:18     CBC Recent Labs  Lab 08/10/19 0053  WBC 5.6  HGB 11.7*  HCT 32.5*  PLT 198  MCV 90.3  MCH 32.5  MCHC 36.0  RDW 12.9  LYMPHSABS 1.7  MONOABS 0.4  EOSABS 0.1  BASOSABS 0.0    Chemistries  Recent Labs  Lab  08/10/19 0053 08/10/19 1225 08/11/19 0409  NA 124* 125* 127*  K 4.1  --  4.6  CL 89*  --  94*  CO2 21*  --  25  GLUCOSE 92  --  106*  BUN 14  --  18  CREATININE 1.01  --  1.21  CALCIUM 8.9  --  8.5*  AST 24  --   --   ALT 23  --   --   ALKPHOS 29*  --   --   BILITOT 1.0  --   --    ------------------------------------------------------------------------------------------------------------------ estimated creatinine clearance is 52.2 mL/min (by C-G formula based on SCr of 1.21 mg/dL). ------------------------------------------------------------------------------------------------------------------ Recent Labs    08/10/19 0053  HGBA1C 5.6   ------------------------------------------------------------------------------------------------------------------ No results for input(s): CHOL, HDL, LDLCALC, TRIG, CHOLHDL, LDLDIRECT in the last 72 hours. ------------------------------------------------------------------------------------------------------------------ Recent Labs    08/10/19 0053  TSH 1.344   ------------------------------------------------------------------------------------------------------------------ No results for input(s): VITAMINB12, FOLATE, FERRITIN, TIBC, IRON, RETICCTPCT in the last 72 hours.  Coagulation profile No results for input(s): INR, PROTIME in the last 168 hours.  No results for input(s): DDIMER in the last 72 hours.  Cardiac Enzymes No results for input(s): CKMB, TROPONINI, MYOGLOBIN in the last 168 hours.  Invalid input(s): CK ------------------------------------------------------------------------------------------------------------------ Invalid input(s): [place]   This is a 58 year old male admitted for hyponatremia. 1.  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    Sodium still low I will place patient on normal saline continues salt fluids and fluid  restriction 2.  Nausea and vomiting: Symptomatic relief; continue antiemetics as needed. 3.  Hypertension: Controlled; continue anti-hypertensives per home regimen once medications verified by pharmacy 4.  BPH: Continue tamsulosin 5.  DVT prophylaxis: Lovenox 6.  GI prophylaxis: None      Code Status Orders  (From admission, onward)         Start     Ordered   08/10/19 0334  Full code  Continuous     08/10/19 0333        Code Status History    Date Active Date Inactive Code Status Order ID Comments User Context   09/11/2016 1639 09/15/2016 1427 Full Code [id number]  Vaughan Basta, MD Inpatient   10/27/2015 1157 11/18/2015 1732 Full Code [id number]  Nicholes Mango, MD Inpatient   10/19/2015 1546 10/21/2015 2155 Full Code [id number]  Dustin Flock, MD Inpatient   09/06/2015 0026 09/07/2015 1754 Full Code [id number]  Theodoro Grist, MD Inpatient   04/17/2015 1604 04/18/2015 1906 Full Code [id number]  Fritzi Mandes, MD ED   Advance Care Planning Activity    Advance Directive Documentation     Most Recent Value  Type of Advance Directive  Healthcare Power of Attorney  Pre-existing out of facility DNR order (yellow form or pink MOST form)  -  "MOST" Form in Place?  -           Consults psychiatry  DVT Prophylaxis  Lovenox   Lab Results  Component Value Date   PLT 198 08/10/2019     Time Spent in minutes   45 minutes spent from 12 PM to 12:45 PM greater than 50% of time spent in care coordination and counseling patient regarding the condition and plan of care.   Dustin Flock M.D on 08/11/2019 at 1:33 PM  Between 7am to 6pm - Pager - [phone]  After 6pm go to [email or web address] - Proofreader  [employer]   Office  [phone]

## 2019-08-11 NOTE — Plan of Care (Signed)
  Problem: Elimination: Goal: Will not experience complications related to urinary retention Outcome: Not Met (add Reason)   Pt. states needing to void but is experiencing retention. Bladder scan this morning showed 360m. After Flomax, pt. was able to independently void. If pt. continues to have difficulty, we will bladder scan and place catheter if needed.

## 2019-08-11 NOTE — Consult Note (Signed)
[facility] Psychiatry Consult   Reason for Consult:  Psychogenic polydipsia Referring Physician:  Hospitalist Patient Identification: [name] MRN:  [id number] Principal Diagnosis: <principal problem not specified> Diagnosis:  Active Problems:   Hyponatremia   Total Time spent with patient: 30 minutes  Subjective:   [name] is a 58 y.o. male patient ports that he is doing better today.  He states he has not been vomiting since he has been in the hospital.  He denies any suicidal or homicidal ideations and denies any hallucinations.  Patient denies dealing with any depression or anxiety.  He reports that he goes by the name Charlett Nose because he picked it and he is a transgender.  He denies having any issues with his transgender or anyone accepting him.  He reports that his family members are dead but he still has support from numerous friends.  He states "you probably do not need to be here because I do not have any problems like that."  Patient did report that he was drinking a lot of water because he was told that his kidneys were messing up because he had been dehydrated.  He reports drinking about 2 quarts of water a day as well as 8 cups of coffee.  HPI:  Per Dr. Jacqualine Code: 58 y.o. male history of chronic kidney disease, migraines, previous seizures, hyponatremia Patient ports yesterday he does not quite feel well he vomited several times in the morning.  Not eaten well throughout the day.  Feels tired fatigue.  Slight blue this vision.  Reports somewhat similar symptoms has been hospitalized before in the past No chest pain no fevers or chills.  No exposure COVID.  Lives with his cat.  Reports symptoms started a little over 24 hours ago.  He has not been able to keep any on his stomach because and every eats he starts to feel nauseated.  Denies having any abdominal pain.  Patient is seen by this provider face-to-face.  Patient presents lying in his bed and is awake.  Patient  is pleasant, calm, and cooperative.  Patient does not appear to be responding to any internal or external stimuli and does not appear to have any delusional or paranoid thoughts.  Nursing staff have reported they have seen no bizarre behavior with the patient.  At this time the patient has continued to deny any suicidal homicidal ideations and denies any hallucinations.  Patient does not meet inpatient criteria and is psychiatrically cleared.  Past Psychiatric History: Denies  Risk to Self:   Risk to Others:   Prior Inpatient Therapy:   Prior Outpatient Therapy:    Past Medical History:  Past Medical History:  Diagnosis Date  . Anemia   . GERD (gastroesophageal reflux disease)   . Hypertension   . Hyponatremia    chronic  . Kidney disease   . Migraine   . Seizures ([place])     Past Surgical History:  Procedure Laterality Date  . COLONOSCOPY WITH PROPOFOL N/A 05/01/2016   Procedure: COLONOSCOPY WITH PROPOFOL;  Surgeon: Lucilla Lame, MD;  Location: [facility] ENDOSCOPY;  Service: Endoscopy;  Laterality: N/A;  . ESOPHAGOGASTRODUODENOSCOPY (EGD) WITH PROPOFOL N/A 05/01/2016   Procedure: ESOPHAGOGASTRODUODENOSCOPY (EGD) WITH PROPOFOL;  Surgeon: Lucilla Lame, MD;  Location: [facility] ENDOSCOPY;  Service: Endoscopy;  Laterality: N/A;  . none     Family History:  Family History  Problem Relation Age of Onset  . Heart attack Father   . Kidney disease Father    Family Psychiatric  History: Denies Social History:  Social History   Substance and Sexual Activity  Alcohol Use No     Social History   Substance and Sexual Activity  Drug Use No    Social History   Socioeconomic History  . Marital status: Single    Spouse name: Not on file  . Number of children: Not on file  . Years of education: Not on file  . Highest education level: Not on file  Occupational History  . Not on file  Social Needs  . Financial resource strain: Not on file  . Food insecurity    Worry: Not on file     Inability: Not on file  . Transportation needs    Medical: Not on file    Non-medical: Not on file  Tobacco Use  . Smoking status: Never Smoker  . Smokeless tobacco: Never Used  Substance and Sexual Activity  . Alcohol use: No  . Drug use: No  . Sexual activity: Not on file  Lifestyle  . Physical activity    Days per week: Not on file    Minutes per session: Not on file  . Stress: Not on file  Relationships  . Social Herbalist on phone: Not on file    Gets together: Not on file    Attends religious service: Not on file    Active member of club or organization: Not on file    Attends meetings of clubs or organizations: Not on file    Relationship status: Not on file  Other Topics Concern  . Not on file  Social History Narrative   Cross dresses    Additional Social History:    Allergies:   Allergies  Allergen Reactions  . Nsaids Other (See Comments)    Kidney disease    Labs:  Results for orders placed or performed during the hospital encounter of 08/10/19 (from the past 48 hour(s))  CBC with Differential     Status: Abnormal   Collection Time: 08/10/19 12:53 AM  Result Value Ref Range   WBC 5.6 4.0 - 10.5 K/uL   RBC 3.60 (L) 4.22 - 5.81 MIL/uL   Hemoglobin 11.7 (L) 13.0 - 17.0 g/dL   HCT 32.5 (L) 39.0 - 52.0 %   MCV 90.3 80.0 - 100.0 fL   MCH 32.5 26.0 - 34.0 pg   MCHC 36.0 30.0 - 36.0 g/dL   RDW 12.9 11.5 - 15.5 %   Platelets 198 150 - 400 K/uL   nRBC 0.0 0.0 - 0.2 %   Neutrophils Relative % 60 %   Neutro Abs 3.4 1.7 - 7.7 K/uL   Lymphocytes Relative 30 %   Lymphs Abs 1.7 0.7 - 4.0 K/uL   Monocytes Relative 8 %   Monocytes Absolute 0.4 0.1 - 1.0 K/uL   Eosinophils Relative 1 %   Eosinophils Absolute 0.1 0.0 - 0.5 K/uL   Basophils Relative 0 %   Basophils Absolute 0.0 0.0 - 0.1 K/uL   Immature Granulocytes 1 %   Abs Immature Granulocytes 0.03 0.00 - 0.07 K/uL    Comment: Performed at [facility], [address] [place],  [place] [address]  Comprehensive metabolic panel     Status: Abnormal   Collection Time: 08/10/19 12:53 AM  Result Value Ref Range   Sodium 124 (L) 135 - 145 mmol/L   Potassium 4.1 3.5 - 5.1 mmol/L   Chloride 89 (L) 98 - 111 mmol/L   CO2 21 (L) 22 -  32 mmol/L   Glucose, Bld 92 70 - 99 mg/dL   BUN 14 6 - 20 mg/dL   Creatinine, Ser 1.01 0.61 - 1.24 mg/dL   Calcium 8.9 8.9 - 10.3 mg/dL   Total Protein 6.9 6.5 - 8.1 g/dL   Albumin 4.2 3.5 - 5.0 g/dL   AST 24 15 - 41 U/L   ALT 23 0 - 44 U/L   Alkaline Phosphatase 29 (L) 38 - 126 U/L   Total Bilirubin 1.0 0.3 - 1.2 mg/dL   GFR calc non Af Amer >60 >60 mL/min   GFR calc Af Amer >60 >60 mL/min   Anion gap 14 5 - 15    Comment: Performed at [facility], [address] [place], [place] [address]  TSH     Status: None   Collection Time: 08/10/19 12:53 AM  Result Value Ref Range   TSH 1.344 0.350 - 4.500 uIU/mL    Comment: Performed by a 3rd Generation assay with a functional sensitivity of <=0.01 uIU/mL. Performed at [facility], [address] [place], [place] [address]   Hemoglobin A1c     Status: None   Collection Time: 08/10/19 12:53 AM  Result Value Ref Range   Hgb A1c MFr Bld 5.6 4.8 - 5.6 %    Comment: (NOTE) Pre diabetes:          5.7%-6.4% Diabetes:              >6.4% Glycemic control for   <7.0% adults with diabetes    Mean Plasma Glucose 114.02 mg/dL    Comment: Performed at [facility] [address], [place], [place] [address]  SARS CORONAVIRUS 2 Nasal Swab Aptima Multi Swab     Status: None   Collection Time: 08/10/19  2:01 AM   Specimen: Aptima Multi Swab; Nasal Swab  Result Value Ref Range   SARS Coronavirus 2 NEGATIVE NEGATIVE    Comment: (NOTE) SARS-CoV-2 target nucleic acids are NOT DETECTED. The SARS-CoV-2 RNA is generally detectable in upper and lower respiratory specimens during the acute phase of infection. Negative results do not preclude SARS-CoV-2 infection, do not rule  out co-infections with other pathogens, and should not be used as the sole basis for treatment or other patient management decisions. Negative results must be combined with clinical observations, patient history, and epidemiological information. The expected result is Negative. Fact Sheet for Patients: SugarRoll.be Fact Sheet for Healthcare Providers: https://www.woods-mathews.com/ This test is not yet approved or cleared by the [place] FDA and  has been authorized for detection and/or diagnosis of SARS-CoV-2 by FDA under an Emergency Use Authorization (EUA). This EUA will remain  in effect (meaning this test can be used) for the duration of the COVID-19 declaration under Section 56 4(b)(1) of the Act, 21 U.S.C. section 360bbb-3(b)(1), unless the authorization is terminated or revoked sooner. Performed at [facility], [place] [address] [place], [place] [address]   Sodium     Status: Abnormal   Collection Time: 08/10/19 12:25 PM  Result Value Ref Range   Sodium 125 (L) 135 - 145 mmol/L    Comment: Performed at [facility], [address] [place], [place] [id number]  Basic metabolic panel     Status: Abnormal   Collection Time: 08/11/19  4:09 AM  Result Value Ref Range   Sodium 127 (L) 135 - 145 mmol/L   Potassium 4.6 3.5 - 5.1 mmol/L   Chloride 94 (L) 98 - 111 mmol/L  CO2 25 22 - 32 mmol/L   Glucose, Bld 106 (H) 70 - 99 mg/dL   BUN 18 6 - 20 mg/dL   Creatinine, Ser 1.21 0.61 - 1.24 mg/dL   Calcium 8.5 (L) 8.9 - 10.3 mg/dL   GFR calc non Af Amer >60 >60 mL/min   GFR calc Af Amer >60 >60 mL/min   Anion gap 8 5 - 15    Comment: Performed at [facility], [address], [place], [place] [address]    Current Facility-Administered Medications  Medication Dose Route Frequency Provider Last Rate Last Dose  . 0.9 %  sodium chloride infusion   Intravenous Continuous Dustin Flock, MD 75 mL/hr at 08/11/19  1628    . acetaminophen (TYLENOL) tablet 650 mg  650 mg Oral Q6H PRN Harrie Foreman, MD       Or  . acetaminophen (TYLENOL) suppository 650 mg  650 mg Rectal Q6H PRN Harrie Foreman, MD      . carbamazepine (TEGRETOL) tablet 200 mg  200 mg Oral Q1200 Dustin Flock, MD   200 mg at 08/11/19 1237  . carbamazepine (TEGRETOL) tablet 400 mg  400 mg Oral BID Dustin Flock, MD   400 mg at 08/11/19 0953  . divalproex (DEPAKOTE) DR tablet 750 mg  750 mg Oral BID Dustin Flock, MD   750 mg at 08/11/19 [id number]  . docusate sodium (COLACE) capsule 100 mg  100 mg Oral BID Harrie Foreman, MD   100 mg at 08/11/19 0954  . enoxaparin (LOVENOX) injection 40 mg  40 mg Subcutaneous QHS Harrie Foreman, MD   40 mg at 08/10/19 2048  . famotidine (PEPCID) tablet 20 mg  20 mg Oral BID Dustin Flock, MD   20 mg at 08/11/19 0954  . ondansetron (ZOFRAN) tablet 4 mg  4 mg Oral Q6H PRN Harrie Foreman, MD       Or  . ondansetron [facility]) injection 4 mg  4 mg Intravenous Q6H PRN Harrie Foreman, MD      . propranolol (INDERAL) tablet 20 mg  20 mg Oral BID Dustin Flock, MD   20 mg at 08/11/19 [id number]  . sodium chloride tablet 1 g  1 g Oral TID WC Dustin Flock, MD   1 g at 08/11/19 1610  . tamsulosin (FLOMAX) capsule 0.4 mg  0.4 mg Oral Daily Harrie Foreman, MD   0.4 mg at 08/11/19 [id number]    Musculoskeletal: Strength & Muscle Tone: within normal limits Gait & Station: Patient remained in bed during evaluation Patient leans: N/A  Psychiatric Specialty Exam: Physical Exam  Nursing note and vitals reviewed. Constitutional: He is oriented to person, place, and time. He appears well-developed and well-nourished.  Cardiovascular: Normal rate.  Respiratory: Effort normal.  Musculoskeletal: Normal range of motion.  Neurological: He is alert and oriented to person, place, and time.    Review of Systems  Constitutional: Negative.   HENT: Negative.   Eyes: Negative.   Respiratory: Negative.    Cardiovascular: Negative.   Gastrointestinal: Negative.   Genitourinary: Negative.   Musculoskeletal: Negative.   Skin: Negative.   Neurological: Negative.   Endo/Heme/Allergies: Negative.   Psychiatric/Behavioral: Negative.     Blood pressure 114/69, pulse 74, temperature 98.6 F (37 C), temperature source Oral, resp. rate 18, height 5\' 3"  (1.6 m), weight 62.3 kg, SpO2 100 %.Body mass index is 24.33 kg/m.  General Appearance: Casual  Eye Contact:  Good  Speech:  Clear and Coherent and Normal  Rate  Volume:  Normal  Mood:  Euthymic  Affect:  Congruent  Thought Process:  Coherent and Descriptions of Associations: Intact  Orientation:  Full (Time, Place, and Person)  Thought Content:  WDL  Suicidal Thoughts:  No  Homicidal Thoughts:  No  Memory:  Immediate;   Good Recent;   Good Remote;   Good  Judgement:  Fair  Insight:  Fair  Psychomotor Activity:  Normal  Concentration:  Concentration: Good  Recall:  Good  Fund of Knowledge:  Good  Language:  Good  Akathisia:  No  Handed:    AIMS (if indicated):     Assets:  Communication Skills Desire for Improvement Financial Resources/Insurance Housing Resilience Social Support Transportation  ADL's:  Intact  Cognition:  WNL  Sleep:        Treatment Plan Summary: Follow up with outpatient provider  Patient has not presented with any type of psychotic or bizarre behavior since he has arrived at the hospital  Patient does report a reason for his excessive drinking however the may have been overly thought as the patient may not of completely understand what needed to be done.  There have been no reports and no documentation of the patient presenting with any bizarre or psychotic behavior.  Based on medications and patient reports she is not on any type of psychotropic medications and has no psychiatric history.  At this time do not feel that the excessive fluid consumption was not due to psychogenic polydipsia.  Disposition: No  evidence of imminent risk to self or others at present.   Patient does not meet criteria for psychiatric inpatient admission. Supportive therapy provided about ongoing stressors. Discussed crisis plan, support from social network, calling 911, coming to the Emergency Department, and calling Suicide Hotline.  [place], FNP 08/11/2019 7:18 PM

## 2019-08-12 LAB — BASIC METABOLIC PANEL
Anion gap: 7 (ref 5–15)
BUN: 16 mg/dL (ref 6–20)
CO2: 25 mmol/L (ref 22–32)
Calcium: 8.3 mg/dL — ABNORMAL LOW (ref 8.9–10.3)
Chloride: 101 mmol/L (ref 98–111)
Creatinine, Ser: 0.88 mg/dL (ref 0.61–1.24)
GFR calc Af Amer: >60 mL/min (ref >60)
GFR calc non Af Amer: >60 mL/min (ref >60)
Glucose, Bld: 86 mg/dL (ref 70–99)
Potassium: 4.5 mmol/L (ref 3.5–5.1)
Sodium: 133 mmol/L — ABNORMAL LOW (ref 135–145)

## 2019-08-12 LAB — CBC
HCT: 30.3 % — ABNORMAL LOW (ref 39.0–52.0)
Hemoglobin: 10.5 g/dL — ABNORMAL LOW (ref 13.0–17.0)
MCH: 32.4 pg (ref 26.0–34.0)
MCHC: 34.7 g/dL (ref 30.0–36.0)
MCV: 93.5 fL (ref 80.0–100.0)
Platelets: 167 10*3/uL (ref 150–400)
RBC: 3.24 MIL/uL — ABNORMAL LOW (ref 4.22–5.81)
RDW: 13.8 % (ref 11.5–15.5)
WBC: 4.3 10*3/uL (ref 4.0–10.5)
nRBC: 0 % (ref 0.0–0.2)

## 2019-08-12 MED ORDER — SODIUM CHLORIDE 1 G PO TABS: 1.0000 g | ORAL_TABLET | Freq: Three times a day (TID) | ORAL | 0 refills | Status: DC

## 2019-08-12 NOTE — Progress Notes (Signed)
Sheral Apley Dao  A and O x 4. VSS. Pt tolerating diet well. No complaints of pain or nausea. IV removed intact, no new prescriptions given. Pt voiced understanding of discharge instructions with no further questions. Pt discharged via wheelchair.  Allergies as of 08/12/2019      Reactions   Nsaids Other (See Comments)   Kidney disease      Medication List    STOP taking these medications   amoxicillin 500 MG capsule Commonly known as: AMOXIL   azithromycin 250 MG tablet Commonly known as: Zithromax Z-Pak     TAKE these medications   alendronate 70 MG tablet Commonly known as: FOSAMAX Take 70 mg by mouth once a week. Take with a full glass of water on an empty stomach.   carbamazepine 200 MG tablet Commonly known as: TEGRETOL Take 200-400 mg by mouth See admin instructions. Take 2 tablets (400) mg by mouth every morning, Take 1 tablet by mouth at noon, and Take 2 tablets (400 mg) by mouth every night at bedtime. What changed: Another medication with the same name was removed. Continue taking this medication, and follow the directions you see here.   chlorpheniramine 4 MG tablet Commonly known as: CHLOR-TRIMETON Take 1 tablet (4 mg total) by mouth 2 (two) times daily as needed for allergies or rhinitis.   divalproex 250 MG DR tablet Commonly known as: DEPAKOTE Take 3 tablets (750 mg total) by mouth every 12 (twelve) hours. What changed: when to take this   famotidine 20 MG tablet Commonly known as: PEPCID Take 1 tablet (20 mg total) by mouth 2 (two) times daily.   feeding supplement (ENSURE ENLIVE) Liqd Take 237 mLs by mouth 3 (three) times daily between meals.   lisinopril 5 MG tablet Commonly known as: ZESTRIL Take 5 mg by mouth at bedtime.   predniSONE 50 MG tablet Commonly known as: DELTASONE Take 1 tablet by mouth daily   propranolol 20 MG tablet Commonly known as: INDERAL Take 20 mg by mouth 2 (two) times daily. What changed: Another medication with the same  name was removed. Continue taking this medication, and follow the directions you see here.   sodium chloride 1 g tablet Take 1 tablet (1 g total) by mouth 3 (three) times daily with meals. What changed: when to take this Notes to patient: 08/12/19   tamsulosin 0.4 MG Caps capsule Commonly known as: FLOMAX Take 1 capsule (0.4 mg total) by mouth daily. What changed: when to take this       Vitals:   08/11/19 2130 08/12/19 0626  BP: 105/68 118/68  Pulse: 70 (!) 58  Resp: 18 18  Temp: 97.7 F (36.5 C) 98.5 F (36.9 C)  SpO2: 100% 100%    Francesco Sor

## 2019-08-13 NOTE — Discharge Summary (Signed)
[place] at [facility], 58 y.o., DOB 10-14-1961, MRN [id number]. 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  The patient reports nonbloody nonbilious emesis.  He is also concerned that he has had decreased urine output.  Laboratory evaluation significant for sodium of 124 which is worse than previously low serum sodium results.  Thus the emergency department staff call hospitalist service for further management.  Patient has a history of psychiatric disorder and history of polydipsia.  He was admitted fluid restriction was done he was also given IV fluids and his sodium tablets were increased with this intervention patient sodium is 133.            Dg Abd 2 Views  Result Date: 08/10/2019 CLINICAL DATA:  Vomiting EXAM: ABDOMEN - 2 VIEW COMPARISON:  None. FINDINGS: Lung bases are clear. No free air beneath the diaphragm. Nonobstructed bowel gas pattern with large amount of stool throughout the colon. No radiopaque calculi. IMPRESSION: Nonobstructed bowel gas pattern with large volume of stool in the colon Electronically Signed   By: Donavan Foil M.D.   On: 08/10/2019 02:18       Today   Subjective:   [name] patient doing well  Objective:   Blood pressure 118/68, pulse (!) 58, temperature 98.5 F (36.9 C), temperature source Oral, resp. rate 18, height 5\' 3"  (1.6 m), weight 62.3 kg, SpO2 100 %.  . No intake or  output data in the 24 hours ending 08/13/19 1636  Exam VITAL SIGNS: Blood pressure 118/68, pulse (!) 58, temperature 98.5 F (36.9 C), temperature source Oral, resp. rate 18, height 5\' 3"  (1.6 m), weight 62.3 kg, SpO2 100 %.  GENERAL:  58 y.o.-year-old patient lying in the bed with no acute distress.  EYES: Pupils equal, round, reactive to light and accommodation. No scleral icterus. Extraocular muscles intact.  HEENT: Head atraumatic, normocephalic. Oropharynx and nasopharynx clear.  NECK:  Supple, no jugular venous distention. No thyroid enlargement, no tenderness.  LUNGS: Normal breath sounds bilaterally, no wheezing, rales,rhonchi or crepitation. No use of accessory muscles of respiration.  CARDIOVASCULAR: S1, S2 normal. No murmurs, rubs, or gallops.  ABDOMEN: Soft, nontender, nondistended. Bowel sounds present. No organomegaly or mass.  EXTREMITIES: No pedal edema, cyanosis, or clubbing.  NEUROLOGIC: Cranial nerves II through XII are intact. Muscle strength 5/5 in all extremities. Sensation intact. Gait not checked.  PSYCHIATRIC: The patient is alert and oriented x 3.  SKIN: No obvious rash, lesion, or ulcer.   Data Review     CBC w Diff:  Lab Results  Component Value Date   WBC 4.3 08/12/2019   HGB 10.5 (L) 08/12/2019   HGB 9.0 (L) 03/10/2015   HCT 30.3 (L) 08/12/2019   HCT 27.1 (L) 03/10/2015   PLT 167 08/12/2019   PLT 148 (L) 03/10/2015   LYMPHOPCT 30 08/10/2019   LYMPHOPCT 38.6 10/06/2014   MONOPCT 8 08/10/2019   MONOPCT 10.6 10/06/2014   EOSPCT 1 08/10/2019   EOSPCT 4.4 10/06/2014   BASOPCT 0 08/10/2019   BASOPCT 0.7  10/06/2014   CMP:  Lab Results  Component Value Date   NA 133 (L) 08/12/2019   NA 131 (L) 03/10/2015   K 4.5 08/12/2019   K 4.5 03/10/2015   CL 101 08/12/2019   CL 96 (L) 03/10/2015   CO2 25 08/12/2019   CO2 26 03/10/2015   BUN 16 08/12/2019   BUN 7 03/10/2015   CREATININE 0.88 08/12/2019   CREATININE 1.19 03/10/2015   PROT 6.9 08/10/2019    PROT 6.8 10/06/2014   ALBUMIN 4.2 08/10/2019   ALBUMIN 3.6 10/06/2014   BILITOT 1.0 08/10/2019   BILITOT 0.3 10/06/2014   ALKPHOS 29 (L) 08/10/2019   ALKPHOS 50 10/06/2014   AST 24 08/10/2019   AST 19 10/06/2014   ALT 23 08/10/2019   ALT 17 10/06/2014  .  Micro Results Recent Results (from the past 240 hour(s))  SARS CORONAVIRUS 2 Nasal Swab Aptima Multi Swab     Status: None   Collection Time: 08/10/19  2:01 AM   Specimen: Aptima Multi Swab; Nasal Swab  Result Value Ref Range Status   SARS Coronavirus 2 NEGATIVE NEGATIVE Final    Comment: (NOTE) SARS-CoV-2 target nucleic acids are NOT DETECTED. The SARS-CoV-2 RNA is generally detectable in upper and lower respiratory specimens during the acute phase of infection. Negative results do not preclude SARS-CoV-2 infection, do not rule out co-infections with other pathogens, and should not be used as the sole basis for treatment or other patient management decisions. Negative results must be combined with clinical observations, patient history, and epidemiological information. The expected result is Negative. Fact Sheet for Patients: SugarRoll.be Fact Sheet for Healthcare Providers: https://www.woods-mathews.com/ This test is not yet approved or cleared by the [place] FDA and  has been authorized for detection and/or diagnosis of SARS-CoV-2 by FDA under an Emergency Use Authorization (EUA). This EUA will remain  in effect (meaning this test can be used) for the duration of the COVID-19 declaration under Section 56 4(b)(1) of the Act, 21 U.S.C. section 360bbb-3(b)(1), unless the authorization is terminated or revoked sooner. Performed at [facility], [place] [address] [place], [place] [address]      Code Status History    Date Active Date Inactive Code Status Order ID Comments User Context   08/10/2019 0333 08/12/2019 1337 Full Code [id number]  Harrie Foreman, MD  Inpatient   09/11/2016 1639 09/15/2016 1427 Full Code [id number]  Vaughan Basta, MD Inpatient   10/27/2015 1157 11/18/2015 1732 Full Code [id number]  Nicholes Mango, MD Inpatient   10/19/2015 1546 10/21/2015 2155 Full Code [id number]  Dustin Flock, MD Inpatient   09/06/2015 0026 09/07/2015 1754 Full Code [id number]  Theodoro Grist, MD Inpatient   04/17/2015 1604 04/18/2015 1906 Full Code [id number]  Fritzi Mandes, MD ED   Advance Care Planning Activity    Advance Directive Documentation     Most Recent Value  Type of Advance Directive  Healthcare Power of Attorney  Pre-existing out of facility DNR order (yellow form or pink MOST form)  -  "MOST" Form in Place?  -          Follow-up Information    [name], MD Follow up in 6 day(s).   Specialty: Family Medicine Why: check bmp at time of visit please make an appoinment.   Contact information: [address] [address] [phone]           Discharge Medications   Allergies as of 08/12/2019      Reactions  Nsaids Other (See Comments)   Kidney disease      Medication List    STOP taking these medications   amoxicillin 500 MG capsule Commonly known as: AMOXIL   azithromycin 250 MG tablet Commonly known as: Zithromax Z-Pak     TAKE these medications   alendronate 70 MG tablet Commonly known as: FOSAMAX Take 70 mg by mouth once a week. Take with a full glass of water on an empty stomach.   carbamazepine 200 MG tablet Commonly known as: TEGRETOL Take 200-400 mg by mouth See admin instructions. Take 2 tablets (400) mg by mouth every morning, Take 1 tablet by mouth at noon, and Take 2 tablets (400 mg) by mouth every night at bedtime. What changed: Another medication with the same name was removed. Continue taking this medication, and follow the directions you see here.   chlorpheniramine 4 MG tablet Commonly known as: CHLOR-TRIMETON Take 1 tablet (4 mg total) by mouth 2 (two) times daily as needed  for allergies or rhinitis.   divalproex 250 MG DR tablet Commonly known as: DEPAKOTE Take 3 tablets (750 mg total) by mouth every 12 (twelve) hours. What changed: when to take this   famotidine 20 MG tablet Commonly known as: PEPCID Take 1 tablet (20 mg total) by mouth 2 (two) times daily.   feeding supplement (ENSURE ENLIVE) Liqd Take 237 mLs by mouth 3 (three) times daily between meals.   lisinopril 5 MG tablet Commonly known as: ZESTRIL Take 5 mg by mouth at bedtime.   predniSONE 50 MG tablet Commonly known as: DELTASONE Take 1 tablet by mouth daily   propranolol 20 MG tablet Commonly known as: INDERAL Take 20 mg by mouth 2 (two) times daily. What changed: Another medication with the same name was removed. Continue taking this medication, and follow the directions you see here.   sodium chloride 1 g tablet Take 1 tablet (1 g total) by mouth 3 (three) times daily with meals. What changed: when to take this Notes to patient: 08/12/19   tamsulosin 0.4 MG Caps capsule Commonly known as: FLOMAX Take 1 capsule (0.4 mg total) by mouth daily. What changed: when to take this          Total Time in preparing paper work, data evaluation and todays exam - 89 minutes  Dustin Flock M.D on 08/13/2019 at [place]  [phone]

## 2020-03-05 ENCOUNTER — Ambulatory Visit: Payer: Medicaid Other | Attending: Internal Medicine

## 2020-03-05 DIAGNOSIS — Z23 Encounter for immunization: Secondary | ICD-10-CM

## 2020-03-05 NOTE — Progress Notes (Signed)
   Covid-19 Vaccination Clinic  Name:  [name]    MRN: [id number] DOB: 1961-12-13  03/05/2020  [name] was observed post Covid-19 immunization for 15 minutes without incident. He was provided with Vaccine Information Sheet and instruction to access the V-Safe system.   [name] was instructed to call 911 with any severe reactions post vaccine: Marland Kitchen Difficulty breathing  . Swelling of face and throat  . A fast heartbeat  . A bad rash all over body  . Dizziness and weakness   Immunizations Administered    Name Date Dose VIS Date Route   Pfizer COVID-19 Vaccine 03/05/2020 10:51 AM 0.3 mL 11/27/2019 Intramuscular   Manufacturer: [place]   Lot: [id number]   [place]: [id number]

## 2020-03-29 ENCOUNTER — Ambulatory Visit: Payer: Medicaid Other | Attending: Internal Medicine

## 2020-03-29 DIAGNOSIS — Z23 Encounter for immunization: Secondary | ICD-10-CM

## 2020-03-29 NOTE — Progress Notes (Signed)
   Covid-19 Vaccination Clinic  Name:  [name]    MRN: [id number] DOB: 06-Sep-1961  03/29/2020  [name] was observed post Covid-19 immunization for 15 minutes without incident. He was provided with Vaccine Information Sheet and instruction to access the V-Safe system.   [name] was instructed to call 911 with any severe reactions post vaccine: Marland Kitchen Difficulty breathing  . Swelling of face and throat  . A fast heartbeat  . A bad rash all over body  . Dizziness and weakness   Immunizations Administered    Name Date Dose VIS Date Route   Pfizer COVID-19 Vaccine 03/29/2020  1:24 PM 0.3 mL 11/27/2019 Intramuscular   Manufacturer: [place]   Lot: [id number]   [place]: [id number]

## 2020-06-19 ENCOUNTER — Observation Stay
Admission: EM | Admit: 2020-06-19 | Discharge: 2020-06-20 | Disposition: A | Discharge status: Home / Self Care | Payer: Medicaid Other | Attending: Internal Medicine | Admitting: Internal Medicine

## 2020-06-19 ENCOUNTER — Other Ambulatory Visit: Payer: Self-pay

## 2020-06-19 ENCOUNTER — Encounter: Payer: Self-pay | Admitting: Emergency Medicine

## 2020-06-19 ENCOUNTER — Emergency Department: Payer: Medicaid Other

## 2020-06-19 DIAGNOSIS — Z841 Family history of disorders of kidney and ureter: Secondary | ICD-10-CM | POA: Insufficient documentation

## 2020-06-19 DIAGNOSIS — Z886 Allergy status to analgesic agent status: Secondary | ICD-10-CM | POA: Diagnosis not present

## 2020-06-19 DIAGNOSIS — R197 Diarrhea, unspecified: Secondary | ICD-10-CM | POA: Diagnosis not present

## 2020-06-19 DIAGNOSIS — R7401 Elevation of levels of liver transaminase levels: Secondary | ICD-10-CM | POA: Diagnosis not present

## 2020-06-19 DIAGNOSIS — H538 Other visual disturbances: Secondary | ICD-10-CM | POA: Insufficient documentation

## 2020-06-19 DIAGNOSIS — R270 Ataxia, unspecified: Secondary | ICD-10-CM

## 2020-06-19 DIAGNOSIS — K219 Gastro-esophageal reflux disease without esophagitis: Secondary | ICD-10-CM | POA: Insufficient documentation

## 2020-06-19 DIAGNOSIS — R12 Heartburn: Secondary | ICD-10-CM | POA: Diagnosis not present

## 2020-06-19 DIAGNOSIS — D509 Iron deficiency anemia, unspecified: Secondary | ICD-10-CM | POA: Insufficient documentation

## 2020-06-19 DIAGNOSIS — E43 Unspecified severe protein-calorie malnutrition: Secondary | ICD-10-CM | POA: Insufficient documentation

## 2020-06-19 DIAGNOSIS — R229 Localized swelling, mass and lump, unspecified: Secondary | ICD-10-CM | POA: Insufficient documentation

## 2020-06-19 DIAGNOSIS — E785 Hyperlipidemia, unspecified: Secondary | ICD-10-CM | POA: Diagnosis not present

## 2020-06-19 DIAGNOSIS — Z7952 Long term (current) use of systemic steroids: Secondary | ICD-10-CM | POA: Insufficient documentation

## 2020-06-19 DIAGNOSIS — Z20822 Contact with and (suspected) exposure to covid-19: Secondary | ICD-10-CM | POA: Diagnosis not present

## 2020-06-19 DIAGNOSIS — G43909 Migraine, unspecified, not intractable, without status migrainosus: Secondary | ICD-10-CM | POA: Insufficient documentation

## 2020-06-19 DIAGNOSIS — Z79899 Other long term (current) drug therapy: Secondary | ICD-10-CM | POA: Insufficient documentation

## 2020-06-19 DIAGNOSIS — Z8249 Family history of ischemic heart disease and other diseases of the circulatory system: Secondary | ICD-10-CM | POA: Insufficient documentation

## 2020-06-19 DIAGNOSIS — E860 Dehydration: Secondary | ICD-10-CM | POA: Diagnosis not present

## 2020-06-19 DIAGNOSIS — G919 Hydrocephalus, unspecified: Secondary | ICD-10-CM | POA: Insufficient documentation

## 2020-06-19 DIAGNOSIS — R251 Tremor, unspecified: Secondary | ICD-10-CM | POA: Insufficient documentation

## 2020-06-19 DIAGNOSIS — R4182 Altered mental status, unspecified: Secondary | ICD-10-CM | POA: Diagnosis not present

## 2020-06-19 DIAGNOSIS — G40909 Epilepsy, unspecified, not intractable, without status epilepticus: Secondary | ICD-10-CM | POA: Diagnosis not present

## 2020-06-19 DIAGNOSIS — I129 Hypertensive chronic kidney disease with stage 1 through stage 4 chronic kidney disease, or unspecified chronic kidney disease: Secondary | ICD-10-CM | POA: Diagnosis not present

## 2020-06-19 DIAGNOSIS — R531 Weakness: Secondary | ICD-10-CM | POA: Diagnosis present

## 2020-06-19 DIAGNOSIS — R42 Dizziness and giddiness: Secondary | ICD-10-CM | POA: Diagnosis not present

## 2020-06-19 DIAGNOSIS — H539 Unspecified visual disturbance: Secondary | ICD-10-CM | POA: Diagnosis not present

## 2020-06-19 DIAGNOSIS — N183 Chronic kidney disease, stage 3 unspecified: Secondary | ICD-10-CM | POA: Diagnosis not present

## 2020-06-19 DIAGNOSIS — D72819 Decreased white blood cell count, unspecified: Secondary | ICD-10-CM | POA: Insufficient documentation

## 2020-06-19 DIAGNOSIS — R627 Adult failure to thrive: Secondary | ICD-10-CM | POA: Diagnosis not present

## 2020-06-19 LAB — COMPREHENSIVE METABOLIC PANEL
ALT: 29 U/L (ref 0–44)
AST: 45 U/L — ABNORMAL HIGH (ref 15–41)
Albumin: 3.7 g/dL (ref 3.5–5.0)
Alkaline Phosphatase: 48 U/L (ref 38–126)
Anion gap: 7 (ref 5–15)
BUN: 32 mg/dL — ABNORMAL HIGH (ref 8–23)
CO2: 26 mmol/L (ref 22–32)
Calcium: 8.6 mg/dL — ABNORMAL LOW (ref 8.9–10.3)
Chloride: 105 mmol/L (ref 98–111)
Creatinine, Ser: 0.97 mg/dL (ref 0.61–1.24)
GFR calc Af Amer: >60 mL/min (ref >60)
GFR calc non Af Amer: >60 mL/min (ref >60)
Glucose, Bld: 96 mg/dL (ref 70–99)
Potassium: 4.6 mmol/L (ref 3.5–5.1)
Sodium: 138 mmol/L (ref 135–145)
Total Bilirubin: 0.8 mg/dL (ref 0.3–1.2)
Total Protein: 6.3 g/dL — ABNORMAL LOW (ref 6.5–8.1)

## 2020-06-19 LAB — GASTROINTESTINAL PANEL BY PCR, STOOL (REPLACES STOOL CULTURE)

## 2020-06-19 LAB — CBC
HCT: 34.1 % — ABNORMAL LOW (ref 39.0–52.0)
Hemoglobin: 12.3 g/dL — ABNORMAL LOW (ref 13.0–17.0)
MCH: 34.1 pg — ABNORMAL HIGH (ref 26.0–34.0)
MCHC: 36.1 g/dL — ABNORMAL HIGH (ref 30.0–36.0)
MCV: 94.5 fL (ref 80.0–100.0)
Platelets: 156 10*3/uL (ref 150–400)
RBC: 3.61 MIL/uL — ABNORMAL LOW (ref 4.22–5.81)
RDW: 14.6 % (ref 11.5–15.5)
WBC: 5.9 10*3/uL (ref 4.0–10.5)
nRBC: 0 % (ref 0.0–0.2)

## 2020-06-19 LAB — LIPASE, BLOOD: Lipase: 52 U/L — ABNORMAL HIGH (ref 11–51)

## 2020-06-19 LAB — C DIFFICILE QUICK SCREEN W PCR REFLEX
C Diff antigen: NEGATIVE
C Diff interpretation: NOT DETECTED
C Diff toxin: NEGATIVE

## 2020-06-19 LAB — TROPONIN I (HIGH SENSITIVITY): Troponin I (High Sensitivity): 4 ng/L (ref <18)

## 2020-06-19 LAB — LACTOFERRIN, FECAL, QUALITATIVE: Lactoferrin, Fecal, Qual: NEGATIVE

## 2020-06-19 MED ORDER — ACETAMINOPHEN 650 MG RE SUPP: 650.0000 mg | Freq: Four times a day (QID) | RECTAL | Status: DC | PRN

## 2020-06-19 MED ORDER — CARBAMAZEPINE 200 MG PO TABS
400.0000 mg | ORAL_TABLET | Freq: Every day | ORAL | Status: DC
  Filled 2020-06-19: qty 2

## 2020-06-19 MED ORDER — SODIUM CHLORIDE 1 G PO TABS
1.0000 g | ORAL_TABLET | Freq: Three times a day (TID) | ORAL | Status: DC
  Administered 2020-06-20 (×2): 1 g via ORAL
  Filled 2020-06-19 (×3): qty 1

## 2020-06-19 MED ORDER — PROPRANOLOL HCL 20 MG PO TABS
20.0000 mg | ORAL_TABLET | Freq: Two times a day (BID) | ORAL | Status: DC
  Administered 2020-06-20: 20 mg via ORAL
  Filled 2020-06-19 (×2): qty 1

## 2020-06-19 MED ORDER — SODIUM CHLORIDE 0.9 % IV SOLN: INTRAVENOUS | Status: DC

## 2020-06-19 MED ORDER — STROKE: EARLY STAGES OF RECOVERY BOOK: Freq: Once | Status: AC

## 2020-06-19 MED ORDER — ONDANSETRON HCL 4 MG PO TABS: 4.0000 mg | ORAL_TABLET | Freq: Four times a day (QID) | ORAL | Status: DC | PRN

## 2020-06-19 MED ORDER — CARBAMAZEPINE 200 MG PO TABS
400.0000 mg | ORAL_TABLET | Freq: Every morning | ORAL | Status: DC
  Administered 2020-06-20: 400 mg via ORAL
  Filled 2020-06-19: qty 2

## 2020-06-19 MED ORDER — MAGNESIUM HYDROXIDE 400 MG/5ML PO SUSP: 30.0000 mL | Freq: Every day | ORAL | Status: DC | PRN

## 2020-06-19 MED ORDER — ACETAMINOPHEN 325 MG PO TABS: 650.0000 mg | ORAL_TABLET | Freq: Four times a day (QID) | ORAL | Status: DC | PRN

## 2020-06-19 MED ORDER — CARBAMAZEPINE 200 MG PO TABS
200.0000 mg | ORAL_TABLET | Freq: Every day | ORAL | Status: DC
  Administered 2020-06-20: 14:00:00 200 mg via ORAL
  Filled 2020-06-19: qty 1

## 2020-06-19 MED ORDER — CARBAMAZEPINE 200 MG PO TABS: 200.0000 mg | ORAL_TABLET | ORAL | Status: DC

## 2020-06-19 MED ORDER — ALENDRONATE SODIUM 70 MG PO TABS: 70.0000 mg | ORAL_TABLET | ORAL | Status: DC

## 2020-06-19 MED ORDER — LISINOPRIL 5 MG PO TABS: 5.0000 mg | ORAL_TABLET | Freq: Every day | ORAL | Status: DC

## 2020-06-19 MED ORDER — LORAZEPAM 2 MG/ML IJ SOLN: 1.0000 mg | INTRAMUSCULAR | Status: DC | PRN

## 2020-06-19 MED ORDER — ENOXAPARIN SODIUM 40 MG/0.4ML ~~LOC~~ SOLN: 40.0000 mg | SUBCUTANEOUS | Status: DC

## 2020-06-19 MED ORDER — TAMSULOSIN HCL 0.4 MG PO CAPS: 0.4000 mg | ORAL_CAPSULE | Freq: Every day | ORAL | Status: DC

## 2020-06-19 MED ORDER — ENSURE ENLIVE PO LIQD
237.0000 mL | Freq: Three times a day (TID) | ORAL | Status: DC
  Administered 2020-06-20 (×2): 237 mL via ORAL

## 2020-06-19 MED ORDER — SODIUM CHLORIDE 0.9 % IV SOLN
1000.0000 mL | Freq: Once | INTRAVENOUS | Status: AC
  Administered 2020-06-19: 1000 mL via INTRAVENOUS

## 2020-06-19 MED ORDER — TRAZODONE HCL 50 MG PO TABS: 25.0000 mg | ORAL_TABLET | Freq: Every evening | ORAL | Status: DC | PRN

## 2020-06-19 MED ORDER — DIVALPROEX SODIUM 500 MG PO DR TAB
750.0000 mg | DELAYED_RELEASE_TABLET | Freq: Two times a day (BID) | ORAL | Status: DC
  Administered 2020-06-20: 10:00:00 750 mg via ORAL
  Filled 2020-06-19 (×2): qty 1

## 2020-06-19 MED ORDER — ONDANSETRON HCL 4 MG/2ML IJ SOLN: 4.0000 mg | Freq: Four times a day (QID) | INTRAMUSCULAR | Status: DC | PRN

## 2020-06-19 MED ORDER — CLOPIDOGREL BISULFATE 75 MG PO TABS
75.0000 mg | ORAL_TABLET | Freq: Every day | ORAL | Status: DC
  Administered 2020-06-20: 75 mg via ORAL
  Filled 2020-06-19: qty 1

## 2020-06-19 NOTE — Progress Notes (Signed)
Notify Sherri if here more than a couple of days

## 2020-06-19 NOTE — ED Notes (Signed)
Admitting MD at bedside at this time.

## 2020-06-19 NOTE — ED Notes (Signed)
Pt urinated in toilet only.

## 2020-06-19 NOTE — ED Notes (Signed)
Pt reports his vision is still slightly blurry but has improved since initial assessment.

## 2020-06-19 NOTE — ED Notes (Signed)
Pt assisted to toilet 

## 2020-06-19 NOTE — ED Triage Notes (Signed)
Patient now also c/o pain between the shoulder blades for past couple weeks.

## 2020-06-19 NOTE — ED Triage Notes (Signed)
Pt from home via EMS.  Pt has has had blurred vision, leg swelling, and diarrhea X 2 weeks.  No hx CHF.  No SHOB.  Has felt fatigue.  Unsteady on both feet.  Denies any pain. No fevers or vomiting.

## 2020-06-19 NOTE — H&P (Addendum)
[place] at [place] NAME: [name]    MR#:  [id number]  DATE OF BIRTH:  06/11/1961  DATE OF ADMISSION:  06/19/2020  PRIMARY CARE PHYSICIAN: [name], MD   REQUESTING/REFERRING PHYSICIAN: Lavonia Drafts, MD  CHIEF COMPLAINT:   Chief Complaint  Patient presents with  . Leg Swelling  . Blurred Vision  . Diarrhea    HISTORY OF PRESENT ILLNESS:  [name]  is a 59 y.o. Caucasian male with a known history of hypertension, GERD, chronic kidney disease, seizure disorder and migraine, who presented to the emergency room with acute onset of unsteady gait for the last 1 to 2 weeks with associated visual changes including diplopia and blurred vision for the last 2 to 4 weeks.  Is been having recurrent diarrhea over the last 1 to 2 weeks which has been watery with no melena or bright red bleeding per rectum.  He last upgraded to his glasses about 3 years ago.  He denied any fever or chills or nausea or vomiting.  No paresthesias or focal muscle weakness.  No dysphagia or worsening dysarthria.  No dysuria, oliguria or hematuria or flank pain.  Upon presentation to the emergency room, vital signs were within normal.  Labs revealed AST of 52 and ALT 45 and CBC showed anemia better than previous levels in August/2020 2 view chest x-ray showed no acute cardiopulmonary disease. EKG showed normal sinus rhythm with rate of 71.  Noncontrasted head CT scan showed generalized cerebral atrophy with no acute intracranial abnormalities.  Stool pathogens and C. difficile were ordered and are pending.  The patient was given 1 L bolus of IV normal saline.  He will be admitted to an observation medical leave monitored bed for further evaluation and management. PAST MEDICAL HISTORY:   Past Medical History:  Diagnosis Date  . Anemia   . GERD (gastroesophageal reflux disease)   . Hypertension   . Hyponatremia    chronic  . Kidney disease   . Migraine   . Seizures  ([place])     PAST SURGICAL HISTORY:   Past Surgical History:  Procedure Laterality Date  . COLONOSCOPY WITH PROPOFOL N/A 05/01/2016   Procedure: COLONOSCOPY WITH PROPOFOL;  Surgeon: Lucilla Lame, MD;  Location: [facility] ENDOSCOPY;  Service: Endoscopy;  Laterality: N/A;  . ESOPHAGOGASTRODUODENOSCOPY (EGD) WITH PROPOFOL N/A 05/01/2016   Procedure: ESOPHAGOGASTRODUODENOSCOPY (EGD) WITH PROPOFOL;  Surgeon: Lucilla Lame, MD;  Location: [facility] ENDOSCOPY;  Service: Endoscopy;  Laterality: N/A;  . none      SOCIAL HISTORY:   Social History   Tobacco Use  . Smoking status: Never Smoker  . Smokeless tobacco: Never Used  Substance Use Topics  . Alcohol use: No    FAMILY HISTORY:   Family History  Problem Relation Age of Onset  . Heart attack Father   . Kidney disease Father     DRUG ALLERGIES:   Allergies  Allergen Reactions  . Nsaids Other (See Comments)    Kidney disease    REVIEW OF SYSTEMS:   ROS As per history of present illness. All pertinent systems were reviewed above. Constitutional,  HEENT, cardiovascular, respiratory, GI, GU, musculoskeletal, neuro, psychiatric, endocrine,  integumentary and hematologic systems were reviewed and are otherwise  negative/unremarkable except for positive findings mentioned above in the HPI.   MEDICATIONS AT HOME:   Prior to Admission medications   Medication Sig Start Date End Date Taking? Authorizing Provider  alendronate (FOSAMAX) 70 MG tablet Take 70  mg by mouth once a week. Take with a full glass of water on an empty stomach.    [provider]  carbamazepine (TEGRETOL) 200 MG tablet Take 200-400 mg by mouth See admin instructions. Take 2 tablets (400) mg by mouth every morning, Take 1 tablet by mouth at noon, and Take 2 tablets (400 mg) by mouth every night at bedtime.    [provider]  chlorpheniramine (CHLOR-TRIMETON) 4 MG tablet Take 1 tablet (4 mg total) by mouth 2 (two) times daily as needed for allergies or  rhinitis. 12/16/16   Beers, Pierce Crane, PA-C  divalproex (DEPAKOTE) 250 MG DR tablet Take 3 tablets (750 mg total) by mouth every 12 (twelve) hours. Patient taking differently: Take 750 mg by mouth 2 (two) times daily.  11/18/15   Vaughan Basta, MD  famotidine (PEPCID) 20 MG tablet Take 1 tablet (20 mg total) by mouth 2 (two) times daily. Patient not taking: Reported on 09/11/2016 04/29/16   Carrie Mew, MD  feeding supplement, ENSURE ENLIVE, (ENSURE ENLIVE) LIQD Take 237 mLs by mouth 3 (three) times daily between meals. 09/15/16   Hower, Aaron Mose, MD  lisinopril (PRINIVIL,ZESTRIL) 5 MG tablet Take 5 mg by mouth at bedtime.    [provider]  predniSONE (DELTASONE) 50 MG tablet Take 1 tablet by mouth daily 12/20/18   Earleen Newport, MD  propranolol (INDERAL) 20 MG tablet Take 20 mg by mouth 2 (two) times daily.    [provider]  sodium chloride 1 g tablet Take 1 tablet (1 g total) by mouth 3 (three) times daily with meals. 08/12/19   Dustin Flock, MD  tamsulosin (FLOMAX) 0.4 MG CAPS capsule Take 1 capsule (0.4 mg total) by mouth daily. Patient taking differently: Take 0.4 mg by mouth at bedtime.  09/16/16   Hower, Aaron Mose, MD      VITAL SIGNS:  Blood pressure (!) 148/120, pulse 70, temperature 98.3 F (36.8 C), temperature source Oral, resp. rate 15, height 5\' 3"  (1.6 m), weight 53.2 kg, SpO2 99 %.  PHYSICAL EXAMINATION:  Physical Exam  GENERAL:  59 y.o.-year-old Caucasian male patient lying in the bed with no acute distress.  EYES: Pupils equal, round, reactive to light and accommodation. No scleral icterus. Extraocular muscles intact.  HEENT: Head atraumatic, normocephalic. Oropharynx and nasopharynx clear.  NECK:  Supple, no jugular venous distention. No thyroid enlargement, no tenderness.  LUNGS: Normal breath sounds bilaterally, no wheezing, rales,rhonchi or crepitation. No use of accessory muscles of respiration.  CARDIOVASCULAR: Regular rate and  rhythm, S1, S2 normal. No murmurs, rubs, or gallops.  ABDOMEN: Soft, nondistended, nontender. Bowel sounds present. No organomegaly or mass.  EXTREMITIES: No pedal edema, cyanosis, or clubbing.  NEUROLOGIC: Cranial nerves II through XII are intact. Muscle strength 5/5 in all extremities. Sensation intact. Gait not checked.  PSYCHIATRIC: The patient is alert and oriented x 3.  Normal affect and good eye contact. SKIN: No obvious rash, lesion, or ulcer.   LABORATORY PANEL:   CBC Recent Labs  Lab 06/19/20 1613  WBC 5.9  HGB 12.3*  HCT 34.1*  PLT 156   ------------------------------------------------------------------------------------------------------------------  Chemistries  Recent Labs  Lab 06/19/20 1613  NA 138  K 4.6  CL 105  CO2 26  GLUCOSE 96  BUN 32*  CREATININE 0.97  CALCIUM 8.6*  AST 45*  ALT 29  ALKPHOS 48  BILITOT 0.8   ------------------------------------------------------------------------------------------------------------------  Cardiac Enzymes No results for input(s): TROPONINI in the last 168 hours. ------------------------------------------------------------------------------------------------------------------  RADIOLOGY:  DG Chest 2 View  Result Date: 06/19/2020 CLINICAL DATA:  Back pain and leg swelling. Blurred vision and diarrhea 2 weeks. EXAM: CHEST - 2 VIEW COMPARISON:  03/13/2016 FINDINGS: Lungs are adequately inflated without airspace consolidation or effusion. Cardiomediastinal silhouette and remainder of the exam is unchanged. IMPRESSION: No active cardiopulmonary disease. Electronically Signed   By: Marin Olp M.D.   On: 06/19/2020 16:53   CT Head Wo Contrast  Result Date: 06/19/2020 CLINICAL DATA:  Blurred vision. EXAM: CT HEAD WITHOUT CONTRAST TECHNIQUE: Contiguous axial images were obtained from the base of the skull through the vertex without intravenous contrast. COMPARISON:  December 20, 2018 FINDINGS: Brain: There is mild to  moderate severity cerebral atrophy with widening of the extra-axial spaces and ventricular dilatation. There are areas of decreased attenuation within the white matter tracts of the supratentorial brain, consistent with microvascular disease changes. Vascular: No hyperdense vessel or unexpected calcification. Skull: Normal. Negative for fracture or focal lesion. Sinuses/Orbits: No acute finding. Other: None. IMPRESSION: 1. Generalized cerebral atrophy. 2. No acute intracranial abnormality. Electronically Signed   By: Virgina Norfolk M.D.   On: 06/19/2020 20:50      IMPRESSION AND PLAN:   1.  Ataxia with visual changes including diplopia and blurred vision. -The patient was admitted to an observation medically monitored bed. -We will follow neuro checks every 4 hours for 24 hours. -We will place the patient on Plavix and check fasting lipids in a.m. -Statin therapy will be provided. -We will obtain a brain MRI without contrast in a.m. for further assessment. -PT/OT and ST consults will be obtained. -Neurology consult will be obtained.  Dr. Doy Mince was notified about the patient.  2.  Intractable diarrhea. -The patient will be hydrated with IV normal saline. -We will follow stool WBC is, culture and C. difficile.  3.  Elevated transaminases. -The patient will be hydrated with IV normal saline and will follow LFTs.  4.  Dyslipidemia. -Continue statin therapy.  5.  Seizure disorder. -We will continue Tegretol, Depakote and as needed Ativan.  6.  Hypertension. -We will continue lisinopril and Inderal.  7.  BPH. -We will continue Flomax.  8.  DVT prophylaxis. -Subcutaneous Lovenox    All the records are reviewed and case discussed with ED provider. The plan of care was discussed in details with the patient (and family). I answered all questions. The patient agreed to proceed with the above mentioned plan. Further management will depend upon hospital course.   CODE STATUS:  Full code  Status is: Observation  The patient remains OBS appropriate and will d/c before 2 midnights.  Dispo: The patient is from: Home              Anticipated d/c is to: Home              Anticipated d/c date is: 1 day              Patient currently is not medically stable to d/c.   TOTAL TIME TAKING CARE OF THIS PATIENT: 55 minutes.    Christel Mormon M.D on 06/19/2020 at 11:18 PM  [facility]   From 7 PM-7 AM, contact night-coverage [email or web address]  CC: Primary care physician; [name], MD   Note: This dictation was prepared with Dragon dictation along with smaller phrase technology. Any transcriptional typo errors that result from this process are unintentional.

## 2020-06-19 NOTE — ED Notes (Addendum)
Pt states he has had diarrhea every time he eats and has become progressively weak over the last week or two. Pt c/o blurry vision and states he is unstable when he walks. Pt given crackers and ginger ale.

## 2020-06-19 NOTE — ED Provider Notes (Signed)
[facility] Emergency Department Provider Note   ____________________________________________    I have reviewed the triage vital signs and the nursing notes.   HISTORY  Chief Complaint Diarrhea and weakness   HPI CURREN [name] is a 59 y.o. male who presents with multiple days of diarrhea and worsening weakness.  He denies abdominal pain.  Reports watery brown stool.  He reports it is becoming difficult for him to stand up on his own because his legs feel weak.  Occasionally he feels dizzy.  Sometimes it is hard for him to focus his eyes.  He denies recent antibiotic use.  No fevers or chills.  No nausea or vomiting.  Has not taken anything for this.  No sick contacts reported.  Past Medical History:  Diagnosis Date   Anemia    GERD (gastroesophageal reflux disease)    Hypertension    Hyponatremia    chronic   Kidney disease    Migraine    Seizures ([place])     Patient Active Problem List   Diagnosis Date Noted   Iron deficiency anemia, unspecified    Benign neoplasm of ascending colon    Heartburn    Gastritis    Altered mental state    Acute delirium 10/31/2015   Fall    Lightheadedness    Laceration of scalp    Near syncope 10/27/2015   Poverty 10/27/2015   Symptomatic anemia    Early satiety    FTT (failure to thrive) in adult    Protein-calorie malnutrition, severe ([place]) 09/07/2015   Hyponatremia 09/05/2015   Elevated troponin 09/05/2015   Hypokalemia 09/05/2015   Leukopenia 09/05/2015   Renal insufficiency 09/05/2015   Tremor, essential 07/15/2015   Essential hypertension 07/15/2015   Chronic renal insufficiency 07/15/2015   Leg swelling 07/15/2015   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/19/2015   Chronic kidney disease (CKD), stage III (moderate) 05/19/2015   Absolute anemia 05/19/2015   Chest pain 04/17/2015    Past Surgical History:  Procedure Laterality Date     COLONOSCOPY WITH PROPOFOL N/A 05/01/2016   Procedure: COLONOSCOPY WITH PROPOFOL;  Surgeon: Lucilla Lame, MD;  Location: [facility] ENDOSCOPY;  Service: Endoscopy;  Laterality: N/A;   ESOPHAGOGASTRODUODENOSCOPY (EGD) WITH PROPOFOL N/A 05/01/2016   Procedure: ESOPHAGOGASTRODUODENOSCOPY (EGD) WITH PROPOFOL;  Surgeon: Lucilla Lame, MD;  Location: [facility] ENDOSCOPY;  Service: Endoscopy;  Laterality: N/A;   none      Prior to Admission medications   Medication Sig Start Date End Date Taking? Authorizing Provider  alendronate (FOSAMAX) 70 MG tablet Take 70 mg by mouth once a week. Take with a full glass of water on an empty stomach.    [provider]  carbamazepine (TEGRETOL) 200 MG tablet Take 200-400 mg by mouth See admin instructions. Take 2 tablets (400) mg by mouth every morning, Take 1 tablet by mouth at noon, and Take 2 tablets (400 mg) by mouth every night at bedtime.    [provider]  chlorpheniramine (CHLOR-TRIMETON) 4 MG tablet Take 1 tablet (4 mg total) by mouth 2 (two) times daily as needed for allergies or rhinitis. 12/16/16   Beers, Pierce Crane, PA-C  divalproex (DEPAKOTE) 250 MG DR tablet Take 3 tablets (750 mg total) by mouth every 12 (twelve) hours. Patient taking differently: Take 750 mg by mouth 2 (two) times daily.  11/18/15   Vaughan Basta, MD  famotidine (PEPCID) 20 MG tablet Take 1 tablet (20 mg total) by mouth 2 (two) times  daily. Patient not taking: Reported on 09/11/2016 04/29/16   Carrie Mew, MD  feeding supplement, ENSURE ENLIVE, (ENSURE ENLIVE) LIQD Take 237 mLs by mouth 3 (three) times daily between meals. 09/15/16   Hower, Aaron Mose, MD  lisinopril (PRINIVIL,ZESTRIL) 5 MG tablet Take 5 mg by mouth at bedtime.    [provider]  predniSONE (DELTASONE) 50 MG tablet Take 1 tablet by mouth daily 12/20/18   Earleen Newport, MD  propranolol (INDERAL) 20 MG tablet Take 20 mg by mouth 2 (two) times daily.    [provider]  sodium  chloride 1 g tablet Take 1 tablet (1 g total) by mouth 3 (three) times daily with meals. 08/12/19   Dustin Flock, MD  tamsulosin (FLOMAX) 0.4 MG CAPS capsule Take 1 capsule (0.4 mg total) by mouth daily. Patient taking differently: Take 0.4 mg by mouth at bedtime.  09/16/16   Hower, Aaron Mose, MD     Allergies Nsaids  Family History  Problem Relation Age of Onset   Heart attack Father    Kidney disease Father     Social History Social History   Tobacco Use   Smoking status: Never Smoker   Smokeless tobacco: Never Used  Substance Use Topics   Alcohol use: No   Drug use: No    Review of Systems  Constitutional: No fever/chills Eyes: Blurry vision ENT: No sore throat. Cardiovascular: Denies chest pain. Respiratory: Denies shortness of breath. Gastrointestinal: As above Genitourinary: Negative for dysuria. Musculoskeletal: Negative for back pain. Skin: Negative for rash. Neurological: Negative for headaches or weakness   ____________________________________________   PHYSICAL EXAM:  VITAL SIGNS: ED Triage Vitals  Enc Vitals Group     BP 06/19/20 1610 133/86     Pulse Rate 06/19/20 1610 74     Resp 06/19/20 1610 16     Temp 06/19/20 1610 98.3 F (36.8 C)     Temp Source 06/19/20 1610 Oral     SpO2 06/19/20 1610 98 %     Weight --      Height 06/19/20 1611 1.6 m (5\' 3" )     Head Circumference --      Peak Flow --      Pain Score 06/19/20 1611 0     Pain Loc --      Pain Edu? --      Excl. in [place]? --     Constitutional: Alert and oriented.  Eyes: Conjunctivae are normal.  PERRLA, EOMI  Nose: No congestion/rhinnorhea. Mouth/Throat: Mucous membranes are moist.    Cardiovascular: Normal rate, regular rhythm.  Good peripheral circulation. Respiratory: Normal respiratory effort.  No retractions. Lungs CTAB. Gastrointestinal: Soft and nontender. No distention.    Musculoskeletal: No lower extremity tenderness nor edema.  Warm and well  perfused Neurologic:  Normal speech and language. No gross focal neurologic deficits are appreciated.  Skin:  Skin is warm, dry and intact. No rash noted. Psychiatric: Mood and affect are normal. Speech and behavior are normal.  ____________________________________________   LABS (all labs ordered are listed, but only abnormal results are displayed)  Labs Reviewed  LIPASE, BLOOD - Abnormal; Notable for the following components:      Result Value   Lipase 52 (*)    All other components within normal limits  COMPREHENSIVE METABOLIC PANEL - Abnormal; Notable for the following components:   BUN 32 (*)    Calcium 8.6 (*)    Total Protein 6.3 (*)    AST 45 (*)  All other components within normal limits  CBC - Abnormal; Notable for the following components:   RBC 3.61 (*)    Hemoglobin 12.3 (*)    HCT 34.1 (*)    MCH 34.1 (*)    MCHC 36.1 (*)    All other components within normal limits  GASTROINTESTINAL PANEL BY PCR, STOOL (REPLACES STOOL CULTURE)  C DIFFICILE QUICK SCREEN W PCR REFLEX  SARS CORONAVIRUS 2 BY RT PCR (HOSPITAL ORDER, [place])  TROPONIN I (HIGH SENSITIVITY)   ____________________________________________  EKG  ED ECG REPORT I, Lavonia Drafts, the attending physician, personally viewed and interpreted this ECG.  Date: 06/19/2020  Rhythm: normal sinus rhythm QRS Axis: normal Intervals: normal ST/T Wave abnormalities: normal Narrative Interpretation: no evidence of acute ischemia  ____________________________________________  RADIOLOGY  CT head unremarkable Chest x-ray viewed by me, no pneumonia ____________________________________________   PROCEDURES  Procedure(s) performed: No  Procedures   Critical Care performed: No ____________________________________________   INITIAL IMPRESSION / ASSESSMENT AND PLAN / ED COURSE  Pertinent labs & imaging results that were available during my care of the patient were  reviewed by me and considered in my medical decision making (see chart for details).  Patient presents with weakness, in the setting of diarrhea.  Suspicious for dehydration, possible C. difficile, possible infectious diarrhea.  Lab work significant for elevated BUN to creatinine ratio consistent with dehydration.  White blood cell count is normal.  CT head performed given his description of dizziness and some ataxia unremarkable.  Patient reports in the past he has had low sodium which is cause similar symptoms.  We will try to obtain stool samples, give IV fluids and reevaluate.  Patient needed significant assistance getting off of the toilet.  He is not had significant provement after fluids.  I will discuss with the hospitalist for admission      ____________________________________________   FINAL CLINICAL IMPRESSION(S) / ED DIAGNOSES  Final diagnoses:  Diarrhea, unspecified type  Weakness  Dizziness        Note:  This document was prepared using Dragon voice recognition software and may include unintentional dictation errors.   Lavonia Drafts, MD 06/19/20 2204

## 2020-06-20 ENCOUNTER — Observation Stay: Payer: Medicaid Other

## 2020-06-20 DIAGNOSIS — R531 Weakness: Secondary | ICD-10-CM | POA: Diagnosis not present

## 2020-06-20 DIAGNOSIS — R42 Dizziness and giddiness: Secondary | ICD-10-CM | POA: Diagnosis not present

## 2020-06-20 DIAGNOSIS — R197 Diarrhea, unspecified: Secondary | ICD-10-CM

## 2020-06-20 DIAGNOSIS — R270 Ataxia, unspecified: Secondary | ICD-10-CM | POA: Diagnosis not present

## 2020-06-20 LAB — LIPID PANEL
Cholesterol: 136 mg/dL (ref 0–200)
HDL: 52 mg/dL (ref >40)
LDL Cholesterol: 73 mg/dL (ref 0–99)
Total CHOL/HDL Ratio: 2.6 RATIO
Triglycerides: 53 mg/dL (ref <150)
VLDL: 11 mg/dL (ref 0–40)

## 2020-06-20 LAB — CBC
HCT: 31.8 % — ABNORMAL LOW (ref 39.0–52.0)
Hemoglobin: 11.3 g/dL — ABNORMAL LOW (ref 13.0–17.0)
MCH: 33.2 pg (ref 26.0–34.0)
MCHC: 35.5 g/dL (ref 30.0–36.0)
MCV: 93.5 fL (ref 80.0–100.0)
Platelets: 148 10*3/uL — ABNORMAL LOW (ref 150–400)
RBC: 3.4 MIL/uL — ABNORMAL LOW (ref 4.22–5.81)
RDW: 14.9 % (ref 11.5–15.5)
WBC: 4.2 10*3/uL (ref 4.0–10.5)
nRBC: 0 % (ref 0.0–0.2)

## 2020-06-20 LAB — BASIC METABOLIC PANEL
Anion gap: 8 (ref 5–15)
BUN: 25 mg/dL — ABNORMAL HIGH (ref 8–23)
CO2: 24 mmol/L (ref 22–32)
Calcium: 8.2 mg/dL — ABNORMAL LOW (ref 8.9–10.3)
Chloride: 106 mmol/L (ref 98–111)
Creatinine, Ser: 0.95 mg/dL (ref 0.61–1.24)
GFR calc Af Amer: >60 mL/min (ref >60)
GFR calc non Af Amer: >60 mL/min (ref >60)
Glucose, Bld: 85 mg/dL (ref 70–99)
Potassium: 4.3 mmol/L (ref 3.5–5.1)
Sodium: 138 mmol/L (ref 135–145)

## 2020-06-20 LAB — HIV ANTIBODY (ROUTINE TESTING W REFLEX): HIV Screen 4th Generation wRfx: NONREACTIVE

## 2020-06-20 LAB — SARS CORONAVIRUS 2 BY RT PCR (HOSPITAL ORDER, PERFORMED IN ~~LOC~~ HOSPITAL LAB): SARS Coronavirus 2: NEGATIVE

## 2020-06-20 LAB — HEMOGLOBIN A1C
Hgb A1c MFr Bld: 5.8 % — ABNORMAL HIGH (ref 4.8–5.6)
Mean Plasma Glucose: 119.76 mg/dL

## 2020-06-20 NOTE — Progress Notes (Signed)
Chart reviewed, Pt visited. Speech is clear and unchanged. No reported swallowing problems. No ST needs identified at this time. Please reconsult if any further concerns arise that ST may assist with.

## 2020-06-20 NOTE — Progress Notes (Signed)
Discharged home  Via w/c   No voiced c/o.

## 2020-06-20 NOTE — TOC Transition Note (Signed)
Transition of Care [facility]) - CM/SW Discharge Note   Patient Details  Name: [name] MRN: [id number] Date of Birth: [birth year]  [facility] CM/SW Contact:  Elease Hashimoto, LCSW Phone Number: 06/20/2020, 11:12 AM   Clinical Narrative:   Met with pt to discuss discharge recommendations. He is agreeable to OPPT at [facility] which is closer to him. Discussed will fax referral and they will contact him to set up appointments. His best number is [phone] to reach him. He has no other needs and is ready to go home. His friend assists him at home but is only there until 11:00 pm. No other needs for discharge.      Barriers to Discharge: Barriers Resolved   Patient Goals and CMS Choice Patient states their goals for this hospitalization and ongoing recovery are:: I hope to go home today, I feel like I am ready CMS Medicare.gov Compare Post Acute Care list provided to:: Patient Choice offered to / list presented to : Patient  Discharge Placement                       Discharge Plan and Services In-house Referral: Clinical Social Work                                   Social Determinants of Health ([place]) Interventions     Readmission Risk Interventions No flowsheet data found.

## 2020-06-20 NOTE — Plan of Care (Signed)
  Problem: Education: Goal: Knowledge of disease or condition will improve 06/20/2020 1514 by Rowe Robert, RN Outcome: Progressing 06/20/2020 0753 by Rowe Robert, RN Outcome: Progressing Goal: Knowledge of secondary prevention will improve 06/20/2020 1514 by Rowe Robert, RN Outcome: Progressing 06/20/2020 0753 by Rowe Robert, RN Outcome: Progressing Goal: Knowledge of patient specific risk factors addressed and post discharge goals established will improve 06/20/2020 1514 by Rowe Robert, RN Outcome: Progressing 06/20/2020 0753 by Rowe Robert, RN Outcome: Progressing Goal: Individualized Educational Video(s) 06/20/2020 1514 by Rowe Robert, RN Outcome: Progressing 06/20/2020 0753 by Rowe Robert, RN Outcome: Progressing   Problem: Ischemic Stroke/TIA Tissue Perfusion: Goal: Complications of ischemic stroke/TIA will be minimized 06/20/2020 1514 by Rowe Robert, RN Outcome: Progressing 06/20/2020 0753 by Rowe Robert, RN Outcome: Progressing   Problem: Education: Goal: Individualized Educational Video(s) 06/20/2020 1514 by Rowe Robert, RN Outcome: Progressing 06/20/2020 0753 by Rowe Robert, RN Outcome: Progressing   Problem: Education: Goal: Knowledge of patient specific risk factors addressed and post discharge goals established will improve 06/20/2020 1514 by Rowe Robert, RN Outcome: Progressing 06/20/2020 0753 by Rowe Robert, RN Outcome: Progressing   Problem: Education: Goal: Knowledge of secondary prevention will improve 06/20/2020 1514 by Rowe Robert, RN Outcome: Progressing 06/20/2020 0753 by Rowe Robert, RN Outcome: Progressing   Problem: Ischemic Stroke/TIA Tissue Perfusion: Goal: Complications of ischemic stroke/TIA will be minimized 06/20/2020 1514 by Rowe Robert, RN Outcome: Progressing 06/20/2020 0753 by Rowe Robert, RN Outcome: Progressing

## 2020-06-20 NOTE — Discharge Summary (Signed)
Physician Discharge Summary  [name] RJJ:[id number] DOB: 12/05/1961 DOA: 06/19/2020  PCP: Devin Pitch, MD  Admit date: 06/19/2020 Discharge date: 06/20/2020  Admitted From: Home Disposition: Home  Recommendations for Outpatient Follow-up:  1. Follow up with PCP in 1-2 weeks 2. Please obtain BMP/CBC in one week 3. Please follow up on the following pending results: None  Home Health: No Equipment/Devices: None Discharge Condition: Stable CODE STATUS: Full Diet recommendation: Heart Healthy  Brief/Interim Summary: [name]  is a 59 y.o. Caucasian male with a known history of hypertension, GERD, chronic kidney disease, seizure disorder and migraine, who presented to the emergency room with acute onset of unsteady gait for the last 1 to 2 weeks with associated visual changes including diplopia and blurred vision for the last 2 to 4 weeks.  Is been having recurrent diarrhea over the last 1 to 2 weeks which has been watery with no melena or bright red bleeding per rectum.  He last upgraded to his glasses about 3 years ago.  He denied any fever or chills or nausea or vomiting.  No paresthesias or focal muscle weakness.  No dysphagia or worsening dysarthria.  No dysuria, oliguria or hematuria or flank pain. Patient denies regular alcohol use but do drink 8 to 10 cups of coffee. On further questioning next day he is having progressively worsening blurry vision and occasionally gets diplopia which needs to be worked up by ophthalmology as an outpatient.  Her stroke work-up which includes CT head and brain MRI was negative for any acute infarcts.  PT evaluated him and they recommend outpatient therapy which was ordered.  Patient did had diarrhea on presentation.  C. difficile colitis and GI pathogen's were negative.  He appears little dehydrated which improved with IV hydration. And did not had any bowel movement on the day of discharge.  He can follow-up with primary care physician for  further management.  He might need outpatient GI evaluation if his diarrhea persists.  He will continue rest of his home medications.  He was advised to cut back on his caffeine intake.  He will follow-up with his primary care physician in 1 to 2 weeks.  Discharge Diagnoses:  Active Problems:   Dizziness   Ataxia   Diarrhea   Weakness   Discharge Instructions  Discharge Instructions    Diet - low sodium heart healthy   Complete by: As directed    Discharge instructions   Complete by: As directed    It was pleasure taking care of you. Please try to cut back on your caffeine intake. Keep your self well-hydrated. Please follow-up with your primary care physician in 1 to 2 weeks. You are also being provided for outpatient physical therapy referral.   Increase activity slowly   Complete by: As directed      Allergies as of 06/20/2020      Reactions   Nsaids Other (See Comments)   Kidney disease      Medication List    STOP taking these medications   famotidine 20 MG tablet Commonly known as: PEPCID   predniSONE 50 MG tablet Commonly known as: DELTASONE     TAKE these medications   alendronate 70 MG tablet Commonly known as: FOSAMAX Take 70 mg by mouth once a week. Take with a full glass of water on an empty stomach.   carbamazepine 200 MG tablet Commonly known as: TEGRETOL Take 200-400 mg by mouth See admin instructions. Take 2 tablets (400) mg by mouth  every morning, Take 1 tablet by mouth at noon, and Take 2 tablets (400 mg) by mouth every night at bedtime.   chlorpheniramine 4 MG tablet Commonly known as: CHLOR-TRIMETON Take 1 tablet (4 mg total) by mouth 2 (two) times daily as needed for allergies or rhinitis.   divalproex 250 MG DR tablet Commonly known as: DEPAKOTE Take 3 tablets (750 mg total) by mouth every 12 (twelve) hours. What changed: when to take this   feeding supplement (ENSURE ENLIVE) Liqd Take 237 mLs by mouth 3 (three) times daily between  meals.   lisinopril 5 MG tablet Commonly known as: ZESTRIL Take 5 mg by mouth at bedtime.   propranolol 20 MG tablet Commonly known as: INDERAL Take 20 mg by mouth 2 (two) times daily.   sodium chloride 1 g tablet Take 1 tablet (1 g total) by mouth 3 (three) times daily with meals.   tamsulosin 0.4 MG Caps capsule Commonly known as: FLOMAX Take 1 capsule (0.4 mg total) by mouth daily. What changed: when to take this       Follow-up Information    Devin Pitch, MD Follow up.   Specialty: Family Medicine Contact information: [address] [address] [phone]              Allergies  Allergen Reactions   Nsaids Other (See Comments)    Kidney disease    Consultations:  None  Procedures/Studies: DG Chest 2 View  Result Date: 06/19/2020 CLINICAL DATA:  Back pain and leg swelling. Blurred vision and diarrhea 2 weeks. EXAM: CHEST - 2 VIEW COMPARISON:  03/13/2016 FINDINGS: Lungs are adequately inflated without airspace consolidation or effusion. Cardiomediastinal silhouette and remainder of the exam is unchanged. IMPRESSION: No active cardiopulmonary disease. Electronically Signed   By: Marin Olp M.D.   On: 06/19/2020 16:53   CT Head Wo Contrast  Result Date: 06/19/2020 CLINICAL DATA:  Blurred vision. EXAM: CT HEAD WITHOUT CONTRAST TECHNIQUE: Contiguous axial images were obtained from the base of the skull through the vertex without intravenous contrast. COMPARISON:  December 20, 2018 FINDINGS: Brain: There is mild to moderate severity cerebral atrophy with widening of the extra-axial spaces and ventricular dilatation. There are areas of decreased attenuation within the white matter tracts of the supratentorial brain, consistent with microvascular disease changes. Vascular: No hyperdense vessel or unexpected calcification. Skull: Normal. Negative for fracture or focal lesion. Sinuses/Orbits: No acute finding. Other: None. IMPRESSION: 1. Generalized  cerebral atrophy. 2. No acute intracranial abnormality. Electronically Signed   By: Virgina Norfolk M.D.   On: 06/19/2020 20:50   MR BRAIN WO CONTRAST  Result Date: 06/20/2020 CLINICAL DATA:  Initial evaluation for acute ataxia, stroke suspected. EXAM: MRI HEAD WITHOUT CONTRAST TECHNIQUE: Multiplanar, multiecho pulse sequences of the brain and surrounding structures were obtained without intravenous contrast. COMPARISON:  Comparison made with prior head CT from 06/19/2020. FINDINGS: Brain: Diffuse prominence of the CSF containing spaces compatible with moderately advanced age-related cerebral atrophy. No significant cerebral white matter disease for age. No abnormal foci of restricted diffusion to suggest acute or subacute ischemia. Gray-white matter differentiation maintained. No encephalomalacia to suggest chronic cortical infarction. No evidence for acute intracranial hemorrhage. Single chronic microhemorrhage noted within the inferior right cerebellum, of doubtful significance in isolation. No mass lesion, midline shift or mass effect. Mild diffuse ventricular prominence related global parenchymal volume loss of hydrocephalus. No extra-axial fluid collection. Pituitary gland suprasellar region within normal limits. Midline structures intact. Vascular: Major intracranial vascular flow voids  are maintained. Skull and upper cervical spine: Craniocervical junction normal. Bone marrow signal intensity within normal limits. No scalp soft tissue abnormality. Sinuses/Orbits: Globes and orbital soft tissues within normal limits. Paranasal sinuses are clear. Trace right mastoid effusion noted, of doubtful significance. Inner ear structures grossly normal. Other: None. IMPRESSION: 1. No acute intracranial abnormality. 2. Moderately advanced age-related cerebral atrophy. Electronically Signed   By: Jeannine Boga M.D.   On: 06/20/2020 02:03     Subjective: Patient denies any complaints when seen today.  He  was concerned that he was not getting his salt tablets during current hospitalization.  He was also concerned about progressively worsening eyesight and we discussed about going to an eye doctor as an outpatient.  Discharge Exam: Vitals:   06/20/20 0600 06/20/20 0742  BP: 123/77 138/77  Pulse: (!) 58 61  Resp: 12 19  Temp: 98.2 F (36.8 C) 97.9 F (36.6 C)  SpO2: 100% 100%   Vitals:   06/20/20 0132 06/20/20 0400 06/20/20 0600 06/20/20 0742  BP: 125/67 115/72 123/77 138/77  Pulse:  (!) 56 (!) 58 61  Resp: 14 10 12 19   Temp: 98.2 F (36.8 C) 98.2 F (36.8 C) 98.2 F (36.8 C) 97.9 F (36.6 C)  TempSrc:    Oral  SpO2: 100% 100% 100% 100%  Weight:      Height:        General: Pt is alert, awake, not in acute distress, mildly dysarthric but stating this is his normal. Cardiovascular: RRR, S1/S2 +, no rubs, no gallops Respiratory: CTA bilaterally, no wheezing, no rhonchi Abdominal: Soft, NT, ND, bowel sounds + Extremities: no edema, no cyanosis   The results of significant diagnostics from this hospitalization (including imaging, microbiology, ancillary and laboratory) are listed below for reference.    Microbiology: Recent Results (from the past 240 hour(s))  Gastrointestinal Panel by PCR , Stool     Status: None   Collection Time: 06/19/20  9:22 PM   Specimen: Stool  Result Value Ref Range Status   Campylobacter species NOT DETECTED NOT DETECTED Final   Plesimonas shigelloides NOT DETECTED NOT DETECTED Final   Salmonella species NOT DETECTED NOT DETECTED Final   Yersinia enterocolitica NOT DETECTED NOT DETECTED Final   Vibrio species NOT DETECTED NOT DETECTED Final   Vibrio cholerae NOT DETECTED NOT DETECTED Final   Enteroaggregative E coli (EAEC) NOT DETECTED NOT DETECTED Final   Enteropathogenic E coli (EPEC) NOT DETECTED NOT DETECTED Final   Enterotoxigenic E coli (ETEC) NOT DETECTED NOT DETECTED Final   Shiga like toxin producing E coli (STEC) NOT DETECTED NOT  DETECTED Final   Shigella/Enteroinvasive E coli (EIEC) NOT DETECTED NOT DETECTED Final   Cryptosporidium NOT DETECTED NOT DETECTED Final   Cyclospora cayetanensis NOT DETECTED NOT DETECTED Final   Entamoeba histolytica NOT DETECTED NOT DETECTED Final   Giardia lamblia NOT DETECTED NOT DETECTED Final   Adenovirus F40/41 NOT DETECTED NOT DETECTED Final   Astrovirus NOT DETECTED NOT DETECTED Final   Norovirus GI/GII NOT DETECTED NOT DETECTED Final   Rotavirus A NOT DETECTED NOT DETECTED Final   Sapovirus (I, II, IV, and V) NOT DETECTED NOT DETECTED Final    Comment: Performed at [facility], [address] [place], [place] [address]  C Difficile Quick Screen w PCR reflex     Status: None   Collection Time: 06/19/20  9:22 PM   Specimen: Stool  Result Value Ref Range Status   C Diff antigen NEGATIVE NEGATIVE Final  C Diff toxin NEGATIVE NEGATIVE Final   C Diff interpretation No C. difficile detected.  Final    Comment: Performed at [facility], [address] [place], [place] [address]  SARS Coronavirus 2 by RT PCR (hospital order, performed in [facility] hospital lab) Nasopharyngeal Nasopharyngeal Swab     Status: None   Collection Time: 06/19/20 10:37 PM   Specimen: Nasopharyngeal Swab  Result Value Ref Range Status   SARS Coronavirus 2 NEGATIVE NEGATIVE Final    Comment: (NOTE) SARS-CoV-2 target nucleic acids are NOT DETECTED.  The SARS-CoV-2 RNA is generally detectable in upper and lower respiratory specimens during the acute phase of infection. The lowest concentration of SARS-CoV-2 viral copies this assay can detect is 250 copies / mL. A negative result does not preclude SARS-CoV-2 infection and should not be used as the sole basis for treatment or other patient management decisions.  A negative result may occur with improper specimen collection / handling, submission of specimen other than nasopharyngeal swab, presence of viral mutation(s) within  the areas targeted by this assay, and inadequate number of viral copies (<250 copies / mL). A negative result must be combined with clinical observations, patient history, and epidemiological information.  Fact Sheet for Patients:   StrictlyIdeas.no  Fact Sheet for Healthcare Providers: BankingDealers.co.za  This test is not yet approved or  cleared by the [place] FDA and has been authorized for detection and/or diagnosis of SARS-CoV-2 by FDA under an Emergency Use Authorization (EUA).  This EUA will remain in effect (meaning this test can be used) for the duration of the COVID-19 declaration under Section 564(b)(1) of the Act, 21 U.S.C. section 360bbb-3(b)(1), unless the authorization is terminated or revoked sooner.  Performed at [facility], [address] [place], [place] [address]      Labs: BNP (last 3 results) No results for input(s): BNP in the last 8760 hours. Basic Metabolic Panel: Recent Labs  Lab 06/19/20 1613 06/20/20 0502  NA 138 138  K 4.6 4.3  CL 105 106  CO2 26 24  GLUCOSE 96 85  BUN 32* 25*  CREATININE 0.97 0.95  CALCIUM 8.6* 8.2*   Liver Function Tests: Recent Labs  Lab 06/19/20 1613  AST 45*  ALT 29  ALKPHOS 48  BILITOT 0.8  PROT 6.3*  ALBUMIN 3.7   Recent Labs  Lab 06/19/20 1613  LIPASE 52*   No results for input(s): AMMONIA in the last 168 hours. CBC: Recent Labs  Lab 06/19/20 1613 06/20/20 0502  WBC 5.9 4.2  HGB 12.3* 11.3*  HCT 34.1* 31.8*  MCV 94.5 93.5  PLT 156 148*   Cardiac Enzymes: No results for input(s): CKTOTAL, CKMB, CKMBINDEX, TROPONINI in the last 168 hours. BNP: Invalid input(s): POCBNP CBG: No results for input(s): GLUCAP in the last 168 hours. D-Dimer No results for input(s): DDIMER in the last 72 hours. Hgb A1c No results for input(s): HGBA1C in the last 72 hours. Lipid Profile Recent Labs    06/20/20 0502  CHOL 136  HDL 52   LDLCALC 73  TRIG 53  CHOLHDL 2.6   Thyroid function studies No results for input(s): TSH, T4TOTAL, T3FREE, THYROIDAB in the last 72 hours.  Invalid input(s): FREET3 Anemia work up No results for input(s): VITAMINB12, FOLATE, FERRITIN, TIBC, IRON, RETICCTPCT in the last 72 hours. Urinalysis    Component Value Date/Time   COLORURINE YELLOW (A) 07/20/2018 1016   APPEARANCEUR CLEAR (A) 07/20/2018 1016   APPEARANCEUR Clear 07/26/2014 1925  LABSPEC 1.006 07/20/2018 1016   LABSPEC 1.006 07/26/2014 1925   PHURINE 6.0 07/20/2018 1016   GLUCOSEU NEGATIVE 07/20/2018 1016   GLUCOSEU Negative 07/26/2014 1925   HGBUR NEGATIVE 07/20/2018 1016   BILIRUBINUR NEGATIVE 07/20/2018 1016   BILIRUBINUR Negative 07/26/2014 1925   KETONESUR NEGATIVE 07/20/2018 1016   PROTEINUR NEGATIVE 07/20/2018 1016   NITRITE NEGATIVE 07/20/2018 1016   LEUKOCYTESUR NEGATIVE 07/20/2018 1016   LEUKOCYTESUR Negative 07/26/2014 1925   Sepsis Labs Invalid input(s): PROCALCITONIN,  WBC,  LACTICIDVEN Microbiology Recent Results (from the past 240 hour(s))  Gastrointestinal Panel by PCR , Stool     Status: None   Collection Time: 06/19/20  9:22 PM   Specimen: Stool  Result Value Ref Range Status   Campylobacter species NOT DETECTED NOT DETECTED Final   Plesimonas shigelloides NOT DETECTED NOT DETECTED Final   Salmonella species NOT DETECTED NOT DETECTED Final   Yersinia enterocolitica NOT DETECTED NOT DETECTED Final   Vibrio species NOT DETECTED NOT DETECTED Final   Vibrio cholerae NOT DETECTED NOT DETECTED Final   Enteroaggregative E coli (EAEC) NOT DETECTED NOT DETECTED Final   Enteropathogenic E coli (EPEC) NOT DETECTED NOT DETECTED Final   Enterotoxigenic E coli (ETEC) NOT DETECTED NOT DETECTED Final   Shiga like toxin producing E coli (STEC) NOT DETECTED NOT DETECTED Final   Shigella/Enteroinvasive E coli (EIEC) NOT DETECTED NOT DETECTED Final   Cryptosporidium NOT DETECTED NOT DETECTED Final   Cyclospora  cayetanensis NOT DETECTED NOT DETECTED Final   Entamoeba histolytica NOT DETECTED NOT DETECTED Final   Giardia lamblia NOT DETECTED NOT DETECTED Final   Adenovirus F40/41 NOT DETECTED NOT DETECTED Final   Astrovirus NOT DETECTED NOT DETECTED Final   Norovirus GI/GII NOT DETECTED NOT DETECTED Final   Rotavirus A NOT DETECTED NOT DETECTED Final   Sapovirus (I, II, IV, and V) NOT DETECTED NOT DETECTED Final    Comment: Performed at [facility], [address] [place], [place] [address]  C Difficile Quick Screen w PCR reflex     Status: None   Collection Time: 06/19/20  9:22 PM   Specimen: Stool  Result Value Ref Range Status   C Diff antigen NEGATIVE NEGATIVE Final   C Diff toxin NEGATIVE NEGATIVE Final   C Diff interpretation No C. difficile detected.  Final    Comment: Performed at [facility], [address] [place], [place] [address]  SARS Coronavirus 2 by RT PCR (hospital order, performed in [facility] hospital lab) Nasopharyngeal Nasopharyngeal Swab     Status: None   Collection Time: 06/19/20 10:37 PM   Specimen: Nasopharyngeal Swab  Result Value Ref Range Status   SARS Coronavirus 2 NEGATIVE NEGATIVE Final    Comment: (NOTE) SARS-CoV-2 target nucleic acids are NOT DETECTED.  The SARS-CoV-2 RNA is generally detectable in upper and lower respiratory specimens during the acute phase of infection. The lowest concentration of SARS-CoV-2 viral copies this assay can detect is 250 copies / mL. A negative result does not preclude SARS-CoV-2 infection and should not be used as the sole basis for treatment or other patient management decisions.  A negative result may occur with improper specimen collection / handling, submission of specimen other than nasopharyngeal swab, presence of viral mutation(s) within the areas targeted by this assay, and inadequate number of viral copies (<250 copies / mL). A negative result must be combined with clinical observations,  patient history, and epidemiological information.  Fact Sheet for Patients:   StrictlyIdeas.no  Fact Sheet  for Healthcare Providers: BankingDealers.co.za  This test is not yet approved or  cleared by the [place] and has been authorized for detection and/or diagnosis of SARS-CoV-2 by FDA under an Emergency Use Authorization (EUA).  This EUA will remain in effect (meaning this test can be used) for the duration of the COVID-19 declaration under Section 564(b)(1) of the Act, 21 U.S.C. section 360bbb-3(b)(1), unless the authorization is terminated or revoked sooner.  Performed at [facility], [address] [place], [place] [address]     Time coordinating discharge: Over 30 minutes  SIGNED:  Lorella Nimrod, MD  [facility] 06/20/2020, 11:58 AM  If 7PM-7AM, please contact night-coverage [email or web address]  This record has been created using Systems analyst. Errors have been sought and corrected,but may not always be located. Such creation errors do not reflect on the standard of care.

## 2020-06-20 NOTE — Evaluation (Signed)
Occupational Therapy Evaluation Patient Details Name: [name] MRN: [id number] DOB: 03/08/1961 Today's Date: 06/20/2020    History of Present Illness [name] is a 59oyM who comes to [facility] on 06/19/20 c blurred vision, LEE, and diarrhea x2W. Pt also reports unsteadiness with gait. MRI is negative for acute changes.   Clinical Impression   Pt was seen for OT evaluation this date. Prior to hospital admission, pt reports independent in all aspects, however experiencing weakness and blurry distance vision over the past 2-4 weeks. Pt lives by himself in a condo with 3 STE. Currently pt demonstrates impairments as described below (See OT problem list) which functionally limit his ability to perform ADL/self-care tasks at baseline independence. Pt's voice appeared strained with difficulty controlling volume and slow and some mild difficulty getting words out. Pt denies difficulties with tasks - questionable insight into deficits. Pt currently requires SBA for ADL mobility and set up/supervision and PRN Min A for UB/LB ADL tasks 2/2 intention tremors bilat and impaired FMC and balance.  Pt would benefit from skilled OT to address noted impairments and functional limitations (see below for any additional details) in order to maximize safety and independence while minimizing falls risk and caregiver burden. Upon hospital discharge, recommend OP OT (neuro) to maximize pt safety and return to PLOF.     Follow Up Recommendations  Outpatient OT    Equipment Recommendations  Tub/shower bench;Other (comment) (grab bars around toilet and in shower)    Recommendations for Other Services       Precautions / Restrictions Precautions Precautions: Fall Restrictions Weight Bearing Restrictions: No      Mobility Bed Mobility Overal bed mobility: Modified Independent                Transfers Overall transfer level: Modified independent Equipment used: None             General  transfer comment: slight initial LOB as pt attempted to stand quickly and sat back down. 2nd attempt better; close SBA provided    Balance Overall balance assessment: Needs assistance Sitting-balance support: No upper extremity supported;Feet supported Sitting balance-Leahy Scale: Good     Standing balance support: No upper extremity supported Standing balance-Leahy Scale: Fair                             ADL either performed or assessed with clinical judgement   ADL Overall ADL's : Needs assistance/impaired Eating/Feeding: Set up Eating/Feeding Details (indicate cue type and reason): pt demo's difficulty opening straw wrapper, assist for pouring soda into glass; set up of cup and pt holds with both hands to drink. Pt demo's difficulty accessing (reaching and grasping) cup when not handed to him directly. Grooming: Standing;Set up;Oral care;Wash/dry face;Supervision/safety Grooming Details (indicate cue type and reason): Close SBA for safety with pt standing at sink to brush teeth; pt demo's impairments in [facility] and mild tremors impacting the quality of his movements, pt stabilizes R hand with L hand when squeezing toothpaste onto toothbrush                               General ADL Comments: Supervision and set up for bathing and dressing tasks, difficulty with [place] Baseline Vision/History: Wears glasses Wears Glasses: At all times Patient Visual Report: Other (comment) (pt reports over past 2 weeks that distance vision has gotten worse)  Perception     Praxis      Pertinent Vitals/Pain Pain Assessment: No/denies pain     Hand Dominance Right   Extremity/Trunk Assessment Upper Extremity Assessment Upper Extremity Assessment: Generalized weakness (bilat shoulders grossly 4-/5, BUE intentional tremor mild with brushing teeth, moderate when attempting to reach/grasp cup to drink, and quite severe with donning doffing glasses, reports to be  chronic)   Lower Extremity Assessment Lower Extremity Assessment: Overall WFL for tasks assessed   Cervical / Trunk Assessment Cervical / Trunk Assessment: Normal   Communication Communication Communication: HOH;Other (comment) (strained voice at times, difficulty with volume control at times)   Cognition Arousal/Alertness: Awake/alert Behavior During Therapy: WFL for tasks assessed/performed Overall Cognitive Status: Difficult to assess                                 General Comments: speech patterning is atypical, slow, loud, drawn out words, difficulty to correlate with cognition or other acute process.   General Comments       Exercises     Shoulder Instructions      Home Living Family/patient expects to be discharged to:: Private residence Living Arrangements: Alone Available Help at Discharge: Friend(s);Available PRN/intermittently Type of Home:  (750sqft condo) Home Access: Stairs to enter Entrance Stairs-Number of Steps: 3 Entrance Stairs-Rails: Right Home Layout: One level     Bathroom Shower/Tub: Teacher, early years/pre: Standard     Home Equipment: Environmental consultant - 2 wheels          Prior Functioning/Environment Level of Independence: Independent        Comments: Per pt, he is still fully independent with IADL including driving, med mgt, and meal prep, indep with ADL although notes some difficulty with [facility] tasks such as holding a cup so he uses straws frequently; endorses 1 fall in past 3 mo (fell into tub)        OT Problem List: Decreased strength;Decreased coordination;Decreased activity tolerance;Decreased safety awareness;Decreased knowledge of use of DME or AE;Impaired balance (sitting and/or standing);Impaired vision/perception;Impaired UE functional use      OT Treatment/Interventions: Self-care/ADL training;Therapeutic exercise;Therapeutic activities;Neuromuscular education;Cognitive  remediation/compensation;Visual/perceptual remediation/compensation;Energy conservation;DME and/or AE instruction;Patient/family education;Balance training    OT Goals(Current goals can be found in the care plan section) Acute Rehab OT Goals Patient Stated Goal: get better OT Goal Formulation: With patient Time For Goal Achievement: 07/04/20 Potential to Achieve Goals: Good ADL Goals Pt Will Perform Eating: with modified independence;with adaptive utensils;sitting Pt Will Perform Lower Body Dressing: with modified independence;with adaptive equipment;sit to/from stand Pt Will Transfer to Toilet: with modified independence;ambulating;grab bars;regular height toilet  OT Frequency: Min 1X/week   Barriers to D/C: Decreased caregiver support          Co-evaluation              AM-PAC OT "6 Clicks" Daily Activity     Outcome Measure Help from another person eating meals?: A Little Help from another person taking care of personal grooming?: A Little Help from another person toileting, which includes using toliet, bedpan, or urinal?: A Little Help from another person bathing (including washing, rinsing, drying)?: A Little Help from another person to put on and taking off regular upper body clothing?: A Little Help from another person to put on and taking off regular lower body clothing?: A Little 6 Click Score: 18   End of Session Nurse Communication: Other (comment) (pt asking for  Ensure)  Activity Tolerance: Patient tolerated treatment well Patient left: in bed;with call bell/phone within reach;with bed alarm set  OT Visit Diagnosis: Other abnormalities of gait and mobility (R26.89);History of falling (Z91.81);Feeding difficulties (R63.3);Ataxia, unspecified (R27.0)                Time: 6503-5465 OT Time Calculation (min): 29 min Charges:  OT General Charges $OT Visit: 1 Visit OT Evaluation $OT Eval Moderate Complexity: 1 Mod OT Treatments $Self Care/Home Management : 8-22  mins  Jeni Salles, MPH, MS, OTR/L ascom [phone] 06/20/20, 10:53 AM

## 2020-06-20 NOTE — Progress Notes (Signed)
Pt for discharge home.  A/o.  Sl d/cd. Instructions discussed with pt. meds / diet / activity AND F/U.  VERBALIZE UNDERSTANDING.. PT CALLED STEVE  CARTER TO COME PICK HIM UP. EATING AT PRESENT AND WILL GET DRESSED AND  BE D/CD.

## 2020-06-20 NOTE — Evaluation (Signed)
Physical Therapy Evaluation Patient Details Name: Devin HUBBERT MRN: [id number] DOB: 08/19/1961 Today's Date: 06/20/2020   History of Present Illness  [name] is a 59oyM who comes to [facility] on 06/19/20 c blurred vision, LEE, and diarrhea x2W. Pt also reports unsteadiness with gait. MRI is negative for acute changes.  Clinical Impression  Pt admitted with above diagnosis. Pt currently with functional limitations due to the deficits listed below (see "PT Problem List"). Upon entry, pt in bed, awake and agreeable to participate. The pt is alert and oriented x4, pleasant, conversational, and generally a fair historian, but struggles with giving specific timelines for particular impairments. Pt performs bed mobility and tranfers without assistive device, good safety awareness. Pt motivated to AMB daily, as he reports significant deconditioning several years ago during a prior admission. Pt AMB 2 laps around unit without UE support, gait speed appropriate for community ambulation. Pt appears to have mild gait ataxia without any gross LOB. Pt has greatest demonstration of ataxia with UE functional fine motor function, heavy intentional tremor, chronicity unclear but pt reports as chronic. Functional mobility assessment demonstrates increased effort/time requirements, poor tolerance, but no need for physical assistance, whereas the patient performed these at a higher level of independence PTA. Pt endorses improvement in gait since arrival, but is agreeable that it continues to present with unsteadiness. Pt will benefit from skilled PT intervention to increase independence and safety with basic mobility in preparation for discharge to the venue listed below.         Follow Up Recommendations Outpatient PT (OPPT neuro on [place] or [place] in [place])    Optician, dispensing  None recommended by PT    Recommendations for [employer]       Precautions / Restrictions  Precautions Precautions: Fall Restrictions [employer] Bearing Restrictions: No      Mobility  Bed Mobility Overal bed mobility: Modified Independent                Transfers Overall transfer level: Modified independent Equipment used: None                Ambulation/Gait   Gait Distance (Feet): 400 Feet Assistive device: None Gait Pattern/deviations: Step-through pattern;Ataxic Gait velocity: 0.69m/s   General Gait Details: ataxic without gross LOB, pt reports continued mild unsteadiness but improved since arrival. (Pt motivated to walk daily to avoid deconditioning per an admission in 2016)  Stairs            Wheelchair Mobility    Modified Rankin (Stroke Patients Only)       Balance Overall balance assessment: Mild deficits observed, not formally tested;Modified Independent                                           Pertinent Vitals/Pain Pain Assessment: No/denies pain    Home Living Family/patient expects to be discharged to:: Private residence Living Arrangements: Alone Available Help at Discharge: Family Type of Home:  (750sqft condo) Home Access: Stairs to enter Entrance Stairs-Rails: Right Entrance Stairs-Number of Steps: 3 Home Layout: One level Home Equipment: Walker - 2 wheels      Prior Function Level of Independence: Independent         Comments: Pt srtill full yindependent with IADL.     Hand Dominance        Extremity/Trunk Assessment   Upper Extremity Assessment  Upper Extremity Assessment:  (BUE intentional tremor quite severe with donning doffing glasses, reports to be chronic.)    Lower Extremity Assessment Lower Extremity Assessment: Overall WFL for tasks assessed    Cervical / Trunk Assessment Cervical / Trunk Assessment: Normal  Communication   Communication: HOH  Cognition Arousal/Alertness: Awake/alert Behavior During Therapy: WFL for tasks assessed/performed Overall Cognitive  Status: Difficult to assess                                 General Comments: speech patterning is atypical, slow, loud, drawn out words, difficutly to correlate with cognition or other acute process.      General Comments      Exercises     Assessment/Plan    PT Assessment Patient needs continued PT services  PT Problem List Decreased activity tolerance;Decreased balance;Decreased mobility       PT Treatment Interventions Balance training;Gait training;Stair training;Functional mobility training;Therapeutic activities;Therapeutic exercise;Patient/family education;Neuromuscular re-education    PT Goals (Current goals can be found in the Care Plan section)  Acute Rehab PT Goals Patient Stated Goal: regain normal steadniess of gait PT Goal Formulation: With patient Time For Goal Achievement: 07/04/20 Potential to Achieve Goals: Good    Frequency Min 2X/week   Barriers to discharge Decreased caregiver support lives alone    Co-evaluation               AM-PAC PT "6 Clicks" Mobility  Outcome Measure Help needed turning from your back to your side while in a flat bed without using bedrails?: None Help needed moving from lying on your back to sitting on the side of a flat bed without using bedrails?: None Help needed moving to and from a bed to a chair (including a wheelchair)?: None Help needed standing up from a chair using your arms (e.g., wheelchair or bedside chair)?: None Help needed to walk in hospital room?: A Little Help needed climbing 3-5 steps with a railing? : A Little 6 Click Score: 22    End of Session Equipment Utilized During Treatment: Gait belt Activity Tolerance: Patient tolerated treatment well;No increased pain Patient left: in bed;with chair alarm set;with call bell/phone within reach Nurse Communication: Mobility status PT Visit Diagnosis: Unsteadiness on feet (R26.81);Other abnormalities of gait and mobility (R26.89);Difficulty  in walking, not elsewhere classified (R26.2);Ataxic gait (R26.0)    Time: 7262-0355 PT Time Calculation (min) (ACUTE ONLY): 18 min   Charges:   PT Evaluation $PT Eval Low Complexity: 1 Low PT Treatments $Therapeutic Exercise: 8-22 mins        9:07 AM, 06/20/20 Etta Grandchild, PT, DPT Physical Therapist - [facility]  [phone] ([place])    Oval Moralez C 06/20/2020, 9:02 AM

## 2020-06-20 NOTE — Plan of Care (Signed)
  Problem: Education: Goal: Knowledge of disease or condition will improve Outcome: Progressing Goal: Knowledge of secondary prevention will improve Outcome: Progressing Goal: Knowledge of patient specific risk factors addressed and post discharge goals established will improve Outcome: Progressing Goal: Individualized Educational Video(s) Outcome: Progressing   

## 2020-06-25 LAB — STOOL CULTURE REFLEX - CMPCXR

## 2020-06-25 LAB — STOOL CULTURE: E coli, Shiga toxin Assay: NEGATIVE

## 2020-06-25 LAB — STOOL CULTURE REFLEX - RSASHR

## 2020-08-29 ENCOUNTER — Ambulatory Visit: Payer: Medicaid Other | Attending: Internal Medicine

## 2020-08-29 DIAGNOSIS — Z23 Encounter for immunization: Secondary | ICD-10-CM

## 2020-08-29 NOTE — Progress Notes (Signed)
   Covid-19 Vaccination Clinic  Name:  [name]    MRN: [id number] DOB: 1961-12-16  08/29/2020  [name] was observed post Covid-19 immunization for 15 minutes without incident. He was provided with Vaccine Information Sheet and instruction to access the V-Safe system.   [name] was instructed to call 911 with any severe reactions post vaccine: Marland Kitchen Difficulty breathing  . Swelling of face and throat  . A fast heartbeat  . A bad rash all over body  . Dizziness and weakness

## 2020-09-16 ENCOUNTER — Inpatient Hospital Stay
Admission: EM | Admit: 2020-09-16 | Discharge: 2020-09-19 | DRG: 311 | Disposition: A | Discharge status: Home / Self Care | Payer: Medicaid Other | Attending: Family Medicine | Admitting: Family Medicine

## 2020-09-16 ENCOUNTER — Emergency Department: Payer: Medicaid Other

## 2020-09-16 ENCOUNTER — Encounter: Payer: Self-pay | Admitting: Emergency Medicine

## 2020-09-16 ENCOUNTER — Other Ambulatory Visit: Payer: Self-pay

## 2020-09-16 DIAGNOSIS — E861 Hypovolemia: Secondary | ICD-10-CM | POA: Diagnosis present

## 2020-09-16 DIAGNOSIS — Z8249 Family history of ischemic heart disease and other diseases of the circulatory system: Secondary | ICD-10-CM

## 2020-09-16 DIAGNOSIS — I249 Acute ischemic heart disease, unspecified: Secondary | ICD-10-CM | POA: Diagnosis present

## 2020-09-16 DIAGNOSIS — Z886 Allergy status to analgesic agent status: Secondary | ICD-10-CM

## 2020-09-16 DIAGNOSIS — Y92511 Restaurant or cafe as the place of occurrence of the external cause: Secondary | ICD-10-CM

## 2020-09-16 DIAGNOSIS — N400 Enlarged prostate without lower urinary tract symptoms: Secondary | ICD-10-CM | POA: Diagnosis present

## 2020-09-16 DIAGNOSIS — Z23 Encounter for immunization: Secondary | ICD-10-CM

## 2020-09-16 DIAGNOSIS — W19XXXA Unspecified fall, initial encounter: Secondary | ICD-10-CM

## 2020-09-16 DIAGNOSIS — I248 Other forms of acute ischemic heart disease: Principal | ICD-10-CM | POA: Diagnosis present

## 2020-09-16 DIAGNOSIS — I159 Secondary hypertension, unspecified: Secondary | ICD-10-CM

## 2020-09-16 DIAGNOSIS — R7989 Other specified abnormal findings of blood chemistry: Secondary | ICD-10-CM | POA: Diagnosis present

## 2020-09-16 DIAGNOSIS — I160 Hypertensive urgency: Secondary | ICD-10-CM | POA: Diagnosis present

## 2020-09-16 DIAGNOSIS — E871 Hypo-osmolality and hyponatremia: Secondary | ICD-10-CM | POA: Diagnosis present

## 2020-09-16 DIAGNOSIS — Z79899 Other long term (current) drug therapy: Secondary | ICD-10-CM

## 2020-09-16 DIAGNOSIS — M25562 Pain in left knee: Secondary | ICD-10-CM

## 2020-09-16 DIAGNOSIS — Z20822 Contact with and (suspected) exposure to covid-19: Secondary | ICD-10-CM | POA: Diagnosis present

## 2020-09-16 DIAGNOSIS — K219 Gastro-esophageal reflux disease without esophagitis: Secondary | ICD-10-CM | POA: Diagnosis present

## 2020-09-16 DIAGNOSIS — G40909 Epilepsy, unspecified, not intractable, without status epilepticus: Secondary | ICD-10-CM | POA: Diagnosis present

## 2020-09-16 DIAGNOSIS — W1830XA Fall on same level, unspecified, initial encounter: Secondary | ICD-10-CM | POA: Diagnosis present

## 2020-09-16 DIAGNOSIS — M25532 Pain in left wrist: Secondary | ICD-10-CM

## 2020-09-16 DIAGNOSIS — R42 Dizziness and giddiness: Secondary | ICD-10-CM

## 2020-09-16 DIAGNOSIS — R778 Other specified abnormalities of plasma proteins: Secondary | ICD-10-CM | POA: Diagnosis present

## 2020-09-16 DIAGNOSIS — E785 Hyperlipidemia, unspecified: Secondary | ICD-10-CM | POA: Diagnosis present

## 2020-09-16 DIAGNOSIS — I214 Non-ST elevation (NSTEMI) myocardial infarction: Secondary | ICD-10-CM

## 2020-09-16 DIAGNOSIS — G43909 Migraine, unspecified, not intractable, without status migrainosus: Secondary | ICD-10-CM | POA: Diagnosis present

## 2020-09-16 LAB — CBC
HCT: 36.2 % — ABNORMAL LOW (ref 39.0–52.0)
Hemoglobin: 13.4 g/dL (ref 13.0–17.0)
MCH: 34.4 pg — ABNORMAL HIGH (ref 26.0–34.0)
MCHC: 37 g/dL — ABNORMAL HIGH (ref 30.0–36.0)
MCV: 92.8 fL (ref 80.0–100.0)
Platelets: 172 10*3/uL (ref 150–400)
RBC: 3.9 MIL/uL — ABNORMAL LOW (ref 4.22–5.81)
RDW: 13 % (ref 11.5–15.5)
WBC: 4.5 10*3/uL (ref 4.0–10.5)
nRBC: 0 % (ref 0.0–0.2)

## 2020-09-16 LAB — URINALYSIS, COMPLETE (UACMP) WITH MICROSCOPIC
Bacteria, UA: NONE SEEN
Glucose, UA: NEGATIVE mg/dL
Hgb urine dipstick: NEGATIVE
Ketones, ur: 5 mg/dL — AB
Leukocytes,Ua: NEGATIVE
Nitrite: NEGATIVE
Protein, ur: NEGATIVE mg/dL
Specific Gravity, Urine: 1.025 (ref 1.005–1.030)
pH: 5 (ref 5.0–8.0)

## 2020-09-16 LAB — BASIC METABOLIC PANEL
Anion gap: 11 (ref 5–15)
BUN: 23 mg/dL (ref 8–23)
CO2: 25 mmol/L (ref 22–32)
Calcium: 9.4 mg/dL (ref 8.9–10.3)
Chloride: 92 mmol/L — ABNORMAL LOW (ref 98–111)
Creatinine, Ser: 1.31 mg/dL — ABNORMAL HIGH (ref 0.61–1.24)
GFR calc Af Amer: >60 mL/min (ref >60)
GFR calc non Af Amer: 58 mL/min — ABNORMAL LOW (ref >60)
Glucose, Bld: 140 mg/dL — ABNORMAL HIGH (ref 70–99)
Potassium: 4.7 mmol/L (ref 3.5–5.1)
Sodium: 128 mmol/L — ABNORMAL LOW (ref 135–145)

## 2020-09-16 LAB — GLUCOSE, CAPILLARY: Glucose-Capillary: 118 mg/dL — ABNORMAL HIGH (ref 70–99)

## 2020-09-16 MED ORDER — SODIUM CHLORIDE 0.9 % IV BOLUS
1000.0000 mL | Freq: Once | INTRAVENOUS | Status: AC
  Administered 2020-09-17: 1000 mL via INTRAVENOUS

## 2020-09-16 NOTE — ED Notes (Signed)
Pt assisted to restroom by this RN. When patient returned to lobby pt was moved to lobby chair for comfort. Pt updated on wait time

## 2020-09-16 NOTE — ED Notes (Addendum)
Pt took 750mg  of Depakote  Inderal 20mg   NA 1g

## 2020-09-16 NOTE — ED Provider Notes (Signed)
[facility] Emergency Department Provider Note   ____________________________________________   First MD Initiated Contact with Patient 09/16/20 2341     (approximate)  I have reviewed the triage vital signs and the nursing notes.   HISTORY  Chief Complaint Fall    HPI [name] is a 59 y.o. male brought to the ED via EMS from [employer] with a chief complaint of dizziness and fall with left wrist and knee pain.  Patient with a history of seizure disorder, hyponatremia, kidney disease who felt like the room was spinning while eating at [employer], stood up, lost his balance and fell.  Denies striking head or LOC.  Called EMS because he was too dizzy to drive home.  Denies recent illness.  Denies fever, chest pain, shortness of breath, abdominal pain, nausea or vomiting.  Currently states dizziness is significantly better.     Past Medical History:  Diagnosis Date  . Anemia   . GERD (gastroesophageal reflux disease)   . Hypertension   . Hyponatremia    chronic  . Kidney disease   . Migraine   . Seizures [facility])     Patient Active Problem List   Diagnosis Date Noted  . Diarrhea   . Weakness   . Ataxia 06/19/2020  . Iron deficiency anemia, unspecified   . Benign neoplasm of ascending colon   . Heartburn   . Gastritis   . Altered mental state   . Acute delirium 10/31/2015  . Fall   . Dizziness   . Laceration of scalp   . Near syncope 10/27/2015  . Poverty 10/27/2015  . Symptomatic anemia   . Early satiety   . FTT (failure to thrive) in adult   . Protein-calorie malnutrition, severe ([place]) 09/07/2015  . Hyponatremia 09/05/2015  . Elevated troponin 09/05/2015  . Hypokalemia 09/05/2015  . Leukopenia 09/05/2015  . Renal insufficiency 09/05/2015  . Tremor, essential 07/15/2015  . Essential hypertension 07/15/2015  . Chronic renal insufficiency 07/15/2015  . Leg swelling 07/15/2015  . Partial epilepsy with impairment of  consciousness ([place]) 05/19/2015  . Chronic kidney disease (CKD), stage III (moderate) ([place]) 05/19/2015  . Absolute anemia 05/19/2015  . Chest pain 04/17/2015    Past Surgical History:  Procedure Laterality Date  . COLONOSCOPY WITH PROPOFOL N/A 05/01/2016   Procedure: COLONOSCOPY WITH PROPOFOL;  Surgeon: Lucilla Lame, MD;  Location: [facility] ENDOSCOPY;  Service: Endoscopy;  Laterality: N/A;  . ESOPHAGOGASTRODUODENOSCOPY (EGD) WITH PROPOFOL N/A 05/01/2016   Procedure: ESOPHAGOGASTRODUODENOSCOPY (EGD) WITH PROPOFOL;  Surgeon: Lucilla Lame, MD;  Location: [facility] ENDOSCOPY;  Service: Endoscopy;  Laterality: N/A;  . none      Prior to Admission medications   Medication Sig Start Date End Date Taking? Authorizing Provider  alendronate (FOSAMAX) 70 MG tablet Take 70 mg by mouth once a week. Take with a full glass of water on an empty stomach.   Yes [provider]  atorvastatin (LIPITOR) 80 MG tablet Take 80 mg by mouth daily. 08/23/20  Yes [provider]  carbamazepine (TEGRETOL) 200 MG tablet Take 200-400 mg by mouth See admin instructions. Take 2 tablets (400) mg by mouth every morning, Take 1 tablet by mouth at noon, and Take 2 tablets (400 mg) by mouth every night at bedtime.   Yes [provider]  divalproex (DEPAKOTE) 250 MG DR tablet Take 3 tablets (750 mg total) by mouth every 12 (twelve) hours. Patient taking differently: Take 750 mg by mouth 2 (two) times daily.  11/18/15  Yes Vaughan Basta, MD  lisinopril (PRINIVIL,ZESTRIL) 5 MG tablet Take 5 mg by mouth at bedtime.   Yes [provider]  propranolol (INDERAL) 20 MG tablet Take 20 mg by mouth 2 (two) times daily.   Yes [provider]  sodium chloride 1 g tablet Take 1 tablet (1 g total) by mouth 3 (three) times daily with meals. 08/12/19  Yes Dustin Flock, MD  tamsulosin (FLOMAX) 0.4 MG CAPS capsule Take 1 capsule (0.4 mg total) by mouth daily. Patient taking differently: Take 0.4 mg by mouth  at bedtime.  09/16/16  Yes Hower, Aaron Mose, MD  feeding supplement, ENSURE ENLIVE, (ENSURE ENLIVE) LIQD Take 237 mLs by mouth 3 (three) times daily between meals. 09/15/16   Hower, Aaron Mose, MD    Allergies Nsaids  Family History  Problem Relation Age of Onset  . Heart attack Father   . Kidney disease Father     Social History Social History   Tobacco Use  . Smoking status: Never Smoker  . Smokeless tobacco: Never Used  Vaping Use  . Vaping Use: Never used  Substance Use Topics  . Alcohol use: No  . Drug use: No    Review of Systems  Constitutional: Positive for fall.  No fever/chills Eyes: No visual changes. ENT: No sore throat. Cardiovascular: Denies chest pain. Respiratory: Denies shortness of breath. Gastrointestinal: No abdominal pain.  No nausea, no vomiting.  No diarrhea.  No constipation. Genitourinary: Negative for dysuria. Musculoskeletal: Positive for left wrist and knee pain.  Negative for back pain. Skin: Negative for rash. Neurological: Positive for dizziness.  Negative for headaches, focal weakness or numbness.   ____________________________________________   PHYSICAL EXAM:  VITAL SIGNS: ED Triage Vitals  Enc Vitals Group     BP 09/16/20 1119 132/87     Pulse Rate 09/16/20 1119 (!) 58     Resp 09/16/20 1119 16     Temp 09/16/20 1119 97.6 F (36.4 C)     Temp Source 09/16/20 1119 Oral     SpO2 09/16/20 1119 100 %     Weight 09/16/20 1120 110 lb (49.9 kg)     Height 09/16/20 1120 5\' 3"  (1.6 m)     Head Circumference --      Peak Flow --      Pain Score 09/16/20 1120 4     Pain Loc --      Pain Edu? --      Excl. in [place]? --     Constitutional: Alert and oriented.  Appears older than stated age and in no acute distress. Eyes: Conjunctivae are normal. PERRL. EOMI. Head: Atraumatic. Nose: No congestion/rhinnorhea. Mouth/Throat: Mucous membranes are mildly dry.   Neck: No stridor.  No cervical spine tenderness to palpation.  No carotid  bruits. Cardiovascular: Normal rate, regular rhythm. Grossly normal heart sounds.  Good peripheral circulation. Respiratory: Normal respiratory effort.  No retractions. Lungs CTAB. Gastrointestinal: Soft and nontender to light or deep palpation. No distention. No abdominal bruits. No CVA tenderness. Musculoskeletal:  Left wrist: No deformity or swelling noted.  Mild tenderness to palpation dorsally with full range of motion.  2+ radial pulse.  Brisk, less than 5-second cap refill. Left knee: No deformity or swelling noted.  No tenderness to palpation.  Full range of motion without pain.  2+ distal pulses.  Brisk, less than 5-second capillary refill.   Neurologic: Alert and oriented x3.  CN II to XII grossly intact.  Essential tremor noted.  Normal  speech and language. No gross focal neurologic deficits are appreciated.  Skin:  Skin is warm, dry and intact. No rash noted. Psychiatric: Mood and affect are normal. Speech and behavior are normal.  ____________________________________________   LABS (all labs ordered are listed, but only abnormal results are displayed)  Labs Reviewed  BASIC METABOLIC PANEL - Abnormal; Notable for the following components:      Result Value   Sodium 128 (*)    Chloride 92 (*)    Glucose, Bld 140 (*)    Creatinine, Ser 1.31 (*)    GFR calc non Af Amer 58 (*)    All other components within normal limits  CBC - Abnormal; Notable for the following components:   RBC 3.90 (*)    HCT 36.2 (*)    MCH 34.4 (*)    MCHC 37.0 (*)    All other components within normal limits  URINALYSIS, COMPLETE (UACMP) WITH MICROSCOPIC - Abnormal; Notable for the following components:   Color, Urine AMBER (*)    APPearance CLEAR (*)    Bilirubin Urine SMALL (*)    Ketones, ur 5 (*)    All other components within normal limits  GLUCOSE, CAPILLARY - Abnormal; Notable for the following components:   Glucose-Capillary 118 (*)    All other components within normal limits  APTT -  Abnormal; Notable for the following components:   aPTT >160 (*)    All other components within normal limits  TROPONIN I (HIGH SENSITIVITY) - Abnormal; Notable for the following components:   Troponin I (High Sensitivity) 32 (*)    All other components within normal limits  TROPONIN I (HIGH SENSITIVITY) - Abnormal; Notable for the following components:   Troponin I (High Sensitivity) 120 (*)    All other components within normal limits  RESPIRATORY PANEL BY RT PCR (FLU A&B, COVID)  PROTIME-INR  HEPARIN LEVEL (UNFRACTIONATED)  BASIC METABOLIC PANEL  CBC  LIPID PANEL  CBG MONITORING, ED  TROPONIN I (HIGH SENSITIVITY)   ____________________________________________  EKG  ED ECG REPORT I, Monti Jilek J, the attending physician, personally viewed and interpreted this ECG.   Date: 09/16/2020  EKG Time: 1121  Rate: 57  Rhythm: sinus bradycardia  Axis: Normal  Intervals:none  ST&T Change: Nonspecific  ____________________________________________  RADIOLOGY  ED MD interpretation: Negative radiographs of left wrist and knee; no acute cardiopulmonary process on chest x-ray  Official radiology report(s): DG Wrist Complete Left  Result Date: 09/16/2020 CLINICAL DATA:  Fall and pain EXAM: LEFT WRIST - COMPLETE 3+ VIEW COMPARISON:  None. FINDINGS: There is no evidence of fracture or dislocation. There is no evidence of arthropathy or other focal bone abnormality. Mild dorsal soft tissue swelling. IMPRESSION: Negative. Electronically Signed   By: Prudencio Pair M.D.   On: 09/16/2020 23:44   DG Chest Port 1 View  Result Date: 09/17/2020 CLINICAL DATA:  Dizziness EXAM: PORTABLE CHEST 1 VIEW COMPARISON:  06/19/2020 FINDINGS: The heart size and mediastinal contours are within normal limits. Both lungs are clear. The visualized skeletal structures are unremarkable. IMPRESSION: No active disease. Electronically Signed   By: Ulyses Jarred M.D.   On: 09/17/2020 03:56   DG Knee Complete 4 Views  Left  Result Date: 09/16/2020 CLINICAL DATA:  Fall and pain EXAM: LEFT KNEE - COMPLETE 4+ VIEW COMPARISON:  None. FINDINGS: No evidence of fracture, dislocation, or joint effusion. No evidence of arthropathy or other focal bone abnormality. Soft tissues are unremarkable. IMPRESSION: Negative. Electronically Signed   By: Kerby Moors  Avutu M.D.   On: 09/16/2020 23:44    ____________________________________________   PROCEDURES  Procedure(s) performed (including Critical Care):  .1-3 Lead EKG Interpretation Performed by: Paulette Blanch, MD Authorized by: Paulette Blanch, MD     Interpretation: normal     ECG rate:  60   ECG rate assessment: normal     Rhythm: sinus rhythm     Ectopy: none     Conduction: normal   Comments:     Patient placed on cardiac monitor to evaluate for arrhythmias    CRITICAL CARE Performed by: Paulette Blanch   Total critical care time: 30 minutes  Critical care time was exclusive of separately billable procedures and treating other patients.  Critical care was necessary to treat or prevent imminent or life-threatening deterioration.  Critical care was time spent personally by me on the following activities: development of treatment plan with patient and/or surrogate as well as nursing, discussions with consultants, evaluation of patient's response to treatment, examination of patient, obtaining history from patient or surrogate, ordering and performing treatments and interventions, ordering and review of laboratory studies, ordering and review of radiographic studies, pulse oximetry and re-evaluation of patient's condition.  ____________________________________________   INITIAL IMPRESSION / ASSESSMENT AND PLAN / ED COURSE  As part of my medical decision making, I reviewed the following data within the [place] notes reviewed and incorporated, Labs reviewed, EKG interpreted, Old chart reviewed, Radiograph reviewed and Notes from prior ED  visits     59 year old male presenting with dizziness and mechanical fall.  Differential diagnosis includes but is not limited to CVA, ACS, metabolic, infectious etiologies, etc.  Laboratory results remarkable for mild hyponatremia and AKI.  Will check troponin.  Will perform orthostatic vital signs; initiate IV resuscitation.  Will reassess.   Clinical Course as of Sep 17 525  Sat Sep 17, 2020  0109 Patient resting in no acute distress.  IV fluids infusing.  Awaiting repeat troponin.   [JS]  4287 Repeat troponin noted to be elevated 120.  Patient is hypertensive 151/119.  He took his evening dose of propranolol already.  Will administer IV hydralazine for blood pressure control, initiate heparin bolus with drip, and discuss with hospitalist services for admission.   [JS]    Clinical Course User Index [JS] Paulette Blanch, MD     ____________________________________________   FINAL CLINICAL IMPRESSION(S) / ED DIAGNOSES  Final diagnoses:  Fall, initial encounter  Dizziness  Left wrist pain  Acute pain of left knee  NSTEMI (non-ST elevated myocardial infarction) ([place])  Secondary hypertension     ED Discharge Orders    None      *Please note:  [name] was evaluated in Emergency Department on 09/17/2020 for the symptoms described in the history of present illness. He was evaluated in the context of the global COVID-19 pandemic, which necessitated consideration that the patient might be at risk for infection with the SARS-CoV-2 virus that causes COVID-19. Institutional protocols and algorithms that pertain to the evaluation of patients at risk for COVID-19 are in a state of rapid change based on information released by regulatory bodies including the CDC and federal and state organizations. These policies and algorithms were followed during the patient's care in the ED.  Some ED evaluations and interventions may be delayed as a result of limited staffing during and the  pandemic.*   Note:  This document was prepared using Dragon voice recognition software and may include unintentional dictation  errors.   Paulette Blanch, MD 09/17/20 [phone]

## 2020-09-16 NOTE — ED Triage Notes (Signed)
Pt in via EMS from home with c/o fall and pain to left wrist. No head injury or LOC. VS stable per EMS.

## 2020-09-16 NOTE — ED Triage Notes (Signed)
Pt arrived via EMS c/o fall. Pt c/o pain to L wrist and L knee.   Pt also c/o dizziness at this time.

## 2020-09-16 NOTE — ED Notes (Signed)
Additional graham crackers provided.

## 2020-09-16 NOTE — ED Notes (Signed)
Pt yelled out in lobby "I haven't had anything to eat in 9 hours!" Pt informed that he had not been given anything because he has not asked for anything to eat or drink. Pt offered crackers and peanut butter and water to drink. Pt states that he wants graham crackers and declines peanut butter.  Pt provided with water and graham crackers. Pt observed by this RN spitting straw paper in the floor. Pt yelling out for RN to take his water. Pt informed that I have no where to hold it for him and that he needs to hold on to it.

## 2020-09-17 ENCOUNTER — Inpatient Hospital Stay: Payer: Medicaid Other

## 2020-09-17 ENCOUNTER — Inpatient Hospital Stay
Admit: 2020-09-17 | Discharge: 2020-09-17 | Disposition: A | Discharge status: Home / Self Care | Payer: Medicaid Other | Attending: Family Medicine | Admitting: Family Medicine

## 2020-09-17 DIAGNOSIS — W19XXXA Unspecified fall, initial encounter: Secondary | ICD-10-CM

## 2020-09-17 DIAGNOSIS — E871 Hypo-osmolality and hyponatremia: Secondary | ICD-10-CM | POA: Diagnosis present

## 2020-09-17 DIAGNOSIS — E861 Hypovolemia: Secondary | ICD-10-CM | POA: Diagnosis present

## 2020-09-17 DIAGNOSIS — M25562 Pain in left knee: Secondary | ICD-10-CM

## 2020-09-17 DIAGNOSIS — Z79899 Other long term (current) drug therapy: Secondary | ICD-10-CM | POA: Diagnosis not present

## 2020-09-17 DIAGNOSIS — Z20822 Contact with and (suspected) exposure to covid-19: Secondary | ICD-10-CM | POA: Diagnosis present

## 2020-09-17 DIAGNOSIS — I248 Other forms of acute ischemic heart disease: Secondary | ICD-10-CM | POA: Diagnosis not present

## 2020-09-17 DIAGNOSIS — R42 Dizziness and giddiness: Secondary | ICD-10-CM

## 2020-09-17 DIAGNOSIS — G40909 Epilepsy, unspecified, not intractable, without status epilepticus: Secondary | ICD-10-CM | POA: Diagnosis present

## 2020-09-17 DIAGNOSIS — Y92511 Restaurant or cafe as the place of occurrence of the external cause: Secondary | ICD-10-CM | POA: Diagnosis not present

## 2020-09-17 DIAGNOSIS — I214 Non-ST elevation (NSTEMI) myocardial infarction: Secondary | ICD-10-CM

## 2020-09-17 DIAGNOSIS — G43909 Migraine, unspecified, not intractable, without status migrainosus: Secondary | ICD-10-CM | POA: Diagnosis present

## 2020-09-17 DIAGNOSIS — W1830XA Fall on same level, unspecified, initial encounter: Secondary | ICD-10-CM | POA: Diagnosis present

## 2020-09-17 DIAGNOSIS — I160 Hypertensive urgency: Secondary | ICD-10-CM | POA: Diagnosis present

## 2020-09-17 DIAGNOSIS — I159 Secondary hypertension, unspecified: Secondary | ICD-10-CM | POA: Diagnosis not present

## 2020-09-17 DIAGNOSIS — R778 Other specified abnormalities of plasma proteins: Secondary | ICD-10-CM

## 2020-09-17 DIAGNOSIS — M25532 Pain in left wrist: Secondary | ICD-10-CM | POA: Diagnosis not present

## 2020-09-17 DIAGNOSIS — K219 Gastro-esophageal reflux disease without esophagitis: Secondary | ICD-10-CM | POA: Diagnosis present

## 2020-09-17 DIAGNOSIS — E785 Hyperlipidemia, unspecified: Secondary | ICD-10-CM | POA: Diagnosis present

## 2020-09-17 DIAGNOSIS — Z23 Encounter for immunization: Secondary | ICD-10-CM | POA: Diagnosis not present

## 2020-09-17 DIAGNOSIS — N400 Enlarged prostate without lower urinary tract symptoms: Secondary | ICD-10-CM | POA: Diagnosis present

## 2020-09-17 DIAGNOSIS — Z886 Allergy status to analgesic agent status: Secondary | ICD-10-CM | POA: Diagnosis not present

## 2020-09-17 DIAGNOSIS — I249 Acute ischemic heart disease, unspecified: Secondary | ICD-10-CM | POA: Diagnosis present

## 2020-09-17 DIAGNOSIS — Z8249 Family history of ischemic heart disease and other diseases of the circulatory system: Secondary | ICD-10-CM | POA: Diagnosis not present

## 2020-09-17 LAB — BASIC METABOLIC PANEL
Anion gap: 9 (ref 5–15)
BUN: 21 mg/dL (ref 8–23)
CO2: 22 mmol/L (ref 22–32)
Calcium: 8.2 mg/dL — ABNORMAL LOW (ref 8.9–10.3)
Chloride: 99 mmol/L (ref 98–111)
Creatinine, Ser: 1.04 mg/dL (ref 0.61–1.24)
GFR calc Af Amer: >60 mL/min (ref >60)
GFR calc non Af Amer: >60 mL/min (ref >60)
Glucose, Bld: 95 mg/dL (ref 70–99)
Potassium: 4.5 mmol/L (ref 3.5–5.1)
Sodium: 130 mmol/L — ABNORMAL LOW (ref 135–145)

## 2020-09-17 LAB — CBC
HCT: 33.2 % — ABNORMAL LOW (ref 39.0–52.0)
Hemoglobin: 11.6 g/dL — ABNORMAL LOW (ref 13.0–17.0)
MCH: 33.8 pg (ref 26.0–34.0)
MCHC: 34.9 g/dL (ref 30.0–36.0)
MCV: 96.8 fL (ref 80.0–100.0)
Platelets: 141 10*3/uL — ABNORMAL LOW (ref 150–400)
RBC: 3.43 MIL/uL — ABNORMAL LOW (ref 4.22–5.81)
RDW: 13 % (ref 11.5–15.5)
WBC: 6.6 10*3/uL (ref 4.0–10.5)
nRBC: 0 % (ref 0.0–0.2)

## 2020-09-17 LAB — LIPID PANEL
Cholesterol: 168 mg/dL (ref 0–200)
HDL: 51 mg/dL (ref >40)
LDL Cholesterol: 105 mg/dL — ABNORMAL HIGH (ref 0–99)
Total CHOL/HDL Ratio: 3.3 RATIO
Triglycerides: 60 mg/dL (ref <150)
VLDL: 12 mg/dL (ref 0–40)

## 2020-09-17 LAB — TROPONIN I (HIGH SENSITIVITY)
Troponin I (High Sensitivity): 32 ng/L — ABNORMAL HIGH (ref <18)
Troponin I (High Sensitivity): 120 ng/L (ref <18)
Troponin I (High Sensitivity): 146 ng/L (ref <18)
Troponin I (High Sensitivity): 120 ng/L (ref <18)

## 2020-09-17 LAB — RESPIRATORY PANEL BY RT PCR (FLU A&B, COVID)
Influenza A by PCR: NEGATIVE
Influenza B by PCR: NEGATIVE
SARS Coronavirus 2 by RT PCR: NEGATIVE

## 2020-09-17 LAB — PROTIME-INR
INR: 0.9 (ref 0.8–1.2)
Prothrombin Time: 11.6 seconds (ref 11.4–15.2)

## 2020-09-17 LAB — HEPARIN LEVEL (UNFRACTIONATED)
Heparin Unfractionated: 0.27 IU/mL — ABNORMAL LOW (ref 0.30–0.70)
Heparin Unfractionated: 0.55 IU/mL (ref 0.30–0.70)

## 2020-09-17 LAB — APTT: aPTT: >160 seconds (ref 24–36)

## 2020-09-17 MED ORDER — HEPARIN (PORCINE) 25000 UT/250ML-% IV SOLN
600.0000 [IU]/h | INTRAVENOUS | Status: DC
  Administered 2020-09-17: 700 [IU]/h via INTRAVENOUS
  Administered 2020-09-18: 800 [IU]/h via INTRAVENOUS
  Filled 2020-09-17: qty 250

## 2020-09-17 MED ORDER — SODIUM CHLORIDE 1 G PO TABS
1.0000 g | ORAL_TABLET | Freq: Three times a day (TID) | ORAL | Status: DC
  Administered 2020-09-17 – 2020-09-19 (×8): 1 g via ORAL
  Filled 2020-09-17 (×12): qty 1

## 2020-09-17 MED ORDER — ACETAMINOPHEN 325 MG PO TABS
650.0000 mg | ORAL_TABLET | ORAL | Status: DC | PRN
  Administered 2020-09-17 – 2020-09-19 (×4): 650 mg via ORAL
  Filled 2020-09-17 (×4): qty 2

## 2020-09-17 MED ORDER — ASPIRIN 81 MG PO CHEW
324.0000 mg | CHEWABLE_TABLET | ORAL | Status: AC
  Administered 2020-09-17: 324 mg via ORAL
  Filled 2020-09-17: qty 4

## 2020-09-17 MED ORDER — ALPRAZOLAM 0.25 MG PO TABS: 0.2500 mg | ORAL_TABLET | Freq: Two times a day (BID) | ORAL | Status: DC | PRN

## 2020-09-17 MED ORDER — HYDRALAZINE HCL 20 MG/ML IJ SOLN: 10.0000 mg | Freq: Four times a day (QID) | INTRAMUSCULAR | Status: DC | PRN

## 2020-09-17 MED ORDER — HYDRALAZINE HCL 20 MG/ML IJ SOLN
5.0000 mg | Freq: Once | INTRAMUSCULAR | Status: AC
  Administered 2020-09-17: 5 mg via INTRAVENOUS
  Filled 2020-09-17: qty 1

## 2020-09-17 MED ORDER — NITROGLYCERIN 0.4 MG SL SUBL: 0.4000 mg | SUBLINGUAL_TABLET | SUBLINGUAL | Status: DC | PRN

## 2020-09-17 MED ORDER — PROPRANOLOL HCL 20 MG PO TABS
20.0000 mg | ORAL_TABLET | Freq: Two times a day (BID) | ORAL | Status: DC
  Administered 2020-09-17 – 2020-09-19 (×5): 20 mg via ORAL
  Filled 2020-09-17 (×6): qty 1

## 2020-09-17 MED ORDER — ZOLPIDEM TARTRATE 5 MG PO TABS: 5.0000 mg | ORAL_TABLET | Freq: Every evening | ORAL | Status: DC | PRN

## 2020-09-17 MED ORDER — ASPIRIN 300 MG RE SUPP: 300.0000 mg | RECTAL | Status: AC

## 2020-09-17 MED ORDER — TAMSULOSIN HCL 0.4 MG PO CAPS
0.4000 mg | ORAL_CAPSULE | Freq: Every day | ORAL | Status: DC
  Administered 2020-09-17 – 2020-09-18 (×2): 0.4 mg via ORAL
  Filled 2020-09-17 (×2): qty 1

## 2020-09-17 MED ORDER — SODIUM CHLORIDE 0.9 % IV SOLN: INTRAVENOUS | Status: DC

## 2020-09-17 MED ORDER — DIVALPROEX SODIUM 500 MG PO DR TAB
750.0000 mg | DELAYED_RELEASE_TABLET | Freq: Two times a day (BID) | ORAL | Status: DC
  Administered 2020-09-17 – 2020-09-19 (×5): 750 mg via ORAL
  Filled 2020-09-17 (×6): qty 1

## 2020-09-17 MED ORDER — CARBAMAZEPINE 200 MG PO TABS
200.0000 mg | ORAL_TABLET | Freq: Every day | ORAL | Status: DC
  Administered 2020-09-17 – 2020-09-19 (×3): 200 mg via ORAL
  Filled 2020-09-17 (×3): qty 1

## 2020-09-17 MED ORDER — HEPARIN BOLUS VIA INFUSION
1000.0000 [IU] | Freq: Once | INTRAVENOUS | Status: AC
  Administered 2020-09-17: 1000 [IU] via INTRAVENOUS
  Filled 2020-09-17: qty 1000

## 2020-09-17 MED ORDER — ATORVASTATIN CALCIUM 80 MG PO TABS
80.0000 mg | ORAL_TABLET | Freq: Every day | ORAL | Status: DC
  Administered 2020-09-17 – 2020-09-18 (×2): 80 mg via ORAL
  Filled 2020-09-17: qty 1
  Filled 2020-09-17: qty 4

## 2020-09-17 MED ORDER — ALENDRONATE SODIUM 70 MG PO TABS: 70.0000 mg | ORAL_TABLET | ORAL | Status: DC

## 2020-09-17 MED ORDER — CARBAMAZEPINE 200 MG PO TABS
400.0000 mg | ORAL_TABLET | Freq: Every day | ORAL | Status: DC
  Administered 2020-09-17 – 2020-09-18 (×2): 400 mg via ORAL
  Filled 2020-09-17 (×3): qty 2

## 2020-09-17 MED ORDER — ONDANSETRON HCL 4 MG/2ML IJ SOLN: 4.0000 mg | Freq: Four times a day (QID) | INTRAMUSCULAR | Status: DC | PRN

## 2020-09-17 MED ORDER — ENSURE ENLIVE PO LIQD
237.0000 mL | Freq: Three times a day (TID) | ORAL | Status: DC
  Administered 2020-09-18 – 2020-09-19 (×3): 237 mL via ORAL

## 2020-09-17 MED ORDER — HEPARIN SODIUM (PORCINE) 5000 UNIT/ML IJ SOLN
60.0000 [IU]/kg | Freq: Once | INTRAMUSCULAR | Status: AC
  Administered 2020-09-17: 3000 [IU] via INTRAVENOUS
  Filled 2020-09-17: qty 1

## 2020-09-17 MED ORDER — CARBAMAZEPINE 200 MG PO TABS: 200.0000 mg | ORAL_TABLET | ORAL | Status: DC

## 2020-09-17 MED ORDER — CARBAMAZEPINE 200 MG PO TABS
400.0000 mg | ORAL_TABLET | Freq: Every day | ORAL | Status: DC
  Administered 2020-09-17 – 2020-09-19 (×3): 400 mg via ORAL
  Filled 2020-09-17 (×3): qty 2
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
## 2020-09-17 NOTE — Progress Notes (Signed)
ANTICOAGULATION CONSULT NOTE - Initial Consult  Pharmacy Consult for Heparin Indication: ACS  Allergies  Allergen Reactions  . Nsaids Other (See Comments)    Kidney disease    Patient Measurements: Height: 5\' 3"  (160 cm) Weight: 49.9 kg (110 lb) IBW/kg (Calculated) : 56.9 HEPARIN DW (KG): 49.9  Vital Signs: Temp: 98.2 F (36.8 C) (10/01 2123) Temp Source: Oral (10/01 2123) BP: 151/119 (10/02 0200) Pulse Rate: 59 (10/02 0200)  Labs: Recent Labs    09/16/20 1123 09/17/20 0100  HGB 13.4  --   HCT 36.2*  --   PLT 172  --   CREATININE 1.31*  --   TROPONINIHS 32* 120*    Estimated Creatinine Clearance: 41.8 mL/min (A) (by C-G formula based on SCr of 1.31 mg/dL (H)).   Medical History: Past Medical History:  Diagnosis Date  . Anemia   . GERD (gastroesophageal reflux disease)   . Hypertension   . Hyponatremia    chronic  . Kidney disease   . Migraine   . Seizures ([place])     Medications:  (Not in a hospital admission)   Assessment: No anticoagulants PTA.  Heparin for ACS.   Initial orders per MD  Goal of Therapy:  Heparin level 0.3-0.7 units/ml Monitor platelets by anticoagulation protocol: Yes   Plan:  Heparin bolus 3000 units then infusion at 700 units/hr Check HL ~ 6 hours after heparin started  Hart Robinsons A 09/17/2020,2:31 AM
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
## 2020-09-17 NOTE — ED Notes (Addendum)
Hospitalist and Cardiologist at bedside at this time.

## 2020-09-17 NOTE — Consult Note (Signed)
CARDIOLOGY CONSULT NOTE               Patient ID: [name] MRN: [id number] DOB/AGE: Sep 09, 1961 59 y.o.  Admit date: 09/16/2020 Referring Physician Dr. Garner Gavel hospitalist Primary Physician Dr. [name] primary Primary Cardiologist [facility] Reason for Consultation borderline troponins  HPI: Patient is a 59 year old male history of hypertension migraine headaches seizures GERD generalized weakness.  Patient complained of lightheaded dizziness and reportedly fell he has had a history of seizures migraines hyponatremia hypertension GERD denies any recent chest pain or angina never been a smoker.  Patient had borderline troponins so cardiology consultation was done recommended.  Patient has had relatively flat troponins up until this point.  If they continue to remain flat is unlikely that this was a cardiac source but further studies are necessary patient feels reasonably comfortable no chest pain no shortness of breath sitting in bed.  Review of systems complete and found to be negative unless listed above     Past Medical History:  Diagnosis Date  . Anemia   . GERD (gastroesophageal reflux disease)   . Hypertension   . Hyponatremia    chronic  . Kidney disease   . Migraine   . Seizures ([place])     Past Surgical History:  Procedure Laterality Date  . COLONOSCOPY WITH PROPOFOL N/A 05/01/2016   Procedure: COLONOSCOPY WITH PROPOFOL;  Surgeon: Lucilla Lame, MD;  Location: [facility] ENDOSCOPY;  Service: Endoscopy;  Laterality: N/A;  . ESOPHAGOGASTRODUODENOSCOPY (EGD) WITH PROPOFOL N/A 05/01/2016   Procedure: ESOPHAGOGASTRODUODENOSCOPY (EGD) WITH PROPOFOL;  Surgeon: Lucilla Lame, MD;  Location: [facility] ENDOSCOPY;  Service: Endoscopy;  Laterality: N/A;  . none      (Not in a hospital admission)  Social History   Socioeconomic History  . Marital status: Single    Spouse name: Not on file  . Number of children: Not on file  . Years of education: Not on file  . Highest  education level: Not on file  Occupational History  . Not on file  Tobacco Use  . Smoking status: Never Smoker  . Smokeless tobacco: Never Used  Vaping Use  . Vaping Use: Never used  Substance and Sexual Activity  . Alcohol use: No  . Drug use: No  . Sexual activity: Not on file  Other Topics Concern  . Not on file  Social History Narrative   Cross dresses    Social Determinants of Health   Financial Resource Strain:   . Difficulty of Paying Living Expenses: Not on file  Food Insecurity:   . Worried About Charity fundraiser in the Last Year: Not on file  . Ran Out of Food in the Last Year: Not on file  Transportation Needs:   . Lack of Transportation (Medical): Not on file  . Lack of Transportation (Non-Medical): Not on file  Physical Activity:   . Days of Exercise per Week: Not on file  . Minutes of Exercise per Session: Not on file  Stress:   . Feeling of Stress : Not on file  Social Connections:   . Frequency of Communication with Friends and Family: Not on file  . Frequency of Social Gatherings with Friends and Family: Not on file  . Attends Religious Services: Not on file  . Active Member of Clubs or Organizations: Not on file  . Attends Archivist Meetings: Not on file  . Marital Status: Not on file  Intimate Partner Violence:   . Fear of Current or  Ex-Partner: Not on file  . Emotionally Abused: Not on file  . Physically Abused: Not on file  . Sexually Abused: Not on file    Family History  Problem Relation Age of Onset  . Heart attack Father   . Kidney disease Father       Review of systems complete and found to be negative unless listed above      PHYSICAL EXAM  General: Well developed, well nourished, in no acute distress HEENT:  Normocephalic and atramatic Neck:  No JVD.  Lungs: Clear bilaterally to auscultation and percussion. Heart: HRRR . Normal S1 and S2 without gallops or murmurs.  Abdomen: Bowel sounds are positive, abdomen  soft and non-tender  Msk:  Back normal, normal gait. Normal strength and tone for age. Extremities: No clubbing, cyanosis or edema.   Neuro: Alert and oriented X 3.  Generalized weakness lower extremities mild contracture Psych:  Good affect, responds appropriately  Labs:   Lab Results  Component Value Date   WBC 6.6 09/17/2020   HGB 11.6 (L) 09/17/2020   HCT 33.2 (L) 09/17/2020   MCV 96.8 09/17/2020   PLT 141 (L) 09/17/2020    Recent Labs  Lab 09/17/20 0521  NA 130*  K 4.5  CL 99  CO2 22  BUN 21  CREATININE 1.04  CALCIUM 8.2*  GLUCOSE 95   Lab Results  Component Value Date   CKTOTAL 61 11/27/2013   TROPONINI <0.03 12/20/2018    Lab Results  Component Value Date   CHOL 168 09/17/2020   CHOL 136 06/20/2020   CHOL 196 11/27/2013   Lab Results  Component Value Date   HDL 51 09/17/2020   HDL 52 06/20/2020   HDL 29 (L) 11/27/2013   Lab Results  Component Value Date   LDLCALC 105 (H) 09/17/2020   LDLCALC 73 06/20/2020   LDLCALC 123 (H) 11/27/2013   Lab Results  Component Value Date   TRIG 60 09/17/2020   TRIG 53 06/20/2020   TRIG 219 (H) 11/27/2013   Lab Results  Component Value Date   CHOLHDL 3.3 09/17/2020   CHOLHDL 2.6 06/20/2020   No results found for: LDLDIRECT    Radiology: DG Wrist Complete Left  Result Date: 09/16/2020 CLINICAL DATA:  Fall and pain EXAM: LEFT WRIST - COMPLETE 3+ VIEW COMPARISON:  None. FINDINGS: There is no evidence of fracture or dislocation. There is no evidence of arthropathy or other focal bone abnormality. Mild dorsal soft tissue swelling. IMPRESSION: Negative. Electronically Signed   By: Prudencio Pair M.D.   On: 09/16/2020 23:44   DG Chest Port 1 View  Result Date: 09/17/2020 CLINICAL DATA:  Dizziness EXAM: PORTABLE CHEST 1 VIEW COMPARISON:  06/19/2020 FINDINGS: The heart size and mediastinal contours are within normal limits. Both lungs are clear. The visualized skeletal structures are unremarkable. IMPRESSION: No active  disease. Electronically Signed   By: Ulyses Jarred M.D.   On: 09/17/2020 03:56   DG Knee Complete 4 Views Left  Result Date: 09/16/2020 CLINICAL DATA:  Fall and pain EXAM: LEFT KNEE - COMPLETE 4+ VIEW COMPARISON:  None. FINDINGS: No evidence of fracture, dislocation, or joint effusion. No evidence of arthropathy or other focal bone abnormality. Soft tissues are unremarkable. IMPRESSION: Negative. Electronically Signed   By: Prudencio Pair M.D.   On: 09/16/2020 23:44    EKG: Normal sinus rhythm nonspecific T2 changes rate of 70  ASSESSMENT AND PLAN:  Borderline troponin Hypertensive urgency Hyponatremia Hyperlipidemia Seizure disorder Generalized weakness . Plan  Agree with admission for observation on telemetry Short-term IV heparin for anticoagulation Recommend follow-up troponins if they remain relatively flat will discontinue further cardiac work-up Echocardiogram for assessment of left ventricular function wall motion Recommend continue current medical therapy Follow-up with cardiology as an outpatient is discharged home My suspicion for primary cardiac problem is low   Signed: Yolonda Kida MD 09/17/2020, 10:16 AM

## 2020-09-17 NOTE — Progress Notes (Signed)
ANTICOAGULATION CONSULT NOTE -   Pharmacy Consult for Heparin Indication: ACS  Allergies  Allergen Reactions  . Nsaids Other (See Comments)    Kidney disease    Patient Measurements: Height: 5\' 3"  (160 cm) Weight: 49.9 kg (110 lb) IBW/kg (Calculated) : 56.9 HEPARIN DW (KG): 49.9  Vital Signs: Temp: 98.8 F (37.1 C) (10/02 0719) Temp Source: Oral (10/02 0719) BP: 108/79 (10/02 1630) Pulse Rate: 62 (10/02 1630)  Labs: Recent Labs    09/16/20 1123 09/16/20 1123 09/17/20 0100 09/17/20 0244 09/17/20 0521 09/17/20 0830 09/17/20 1128 09/17/20 1750  HGB 13.4  --   --   --  11.6*  --   --   --   HCT 36.2*  --   --   --  33.2*  --   --   --   PLT 172  --   --   --  141*  --   --   --   APTT  --   --   --  >160*  --   --   --   --   LABPROT  --   --   --  11.6  --   --   --   --   INR  --   --   --  0.9  --   --   --   --   HEPARINUNFRC  --   --   --   --   --  0.27*  --  0.55  CREATININE 1.31*  --   --   --  1.04  --   --   --   TROPONINIHS 32*   < > 120*  --  146*  --  120*  --    < > = values in this interval not displayed.    Estimated Creatinine Clearance: 52.6 mL/min (by C-G formula based on SCr of 1.04 mg/dL).   Medical History: Past Medical History:  Diagnosis Date  . Anemia   . GERD (gastroesophageal reflux disease)   . Hypertension   . Hyponatremia    chronic  . Kidney disease   . Migraine   . Seizures ([place])     Medications:  (Not in a hospital admission)   Assessment: No anticoagulants PTA.  Heparin for ACS.   Initial orders per MD  10/2 HL @ 0830=0.27. bolused 1000 units then increased infusion to 800 units/hr  Goal of Therapy:  Heparin level 0.3-0.7 units/ml Monitor platelets by anticoagulation protocol: Yes   Plan:  10/02 1750 HL 0.55 - continue 800 units/hr  reCheck HL ~ 6 hours for confirmation  Daily CBC's  Lu Duffel, PharmD, BCPS Clinical Pharmacist 09/17/2020 6:23 PM

## 2020-09-17 NOTE — ED Notes (Signed)
Breakfast tray given at this time.  

## 2020-09-17 NOTE — ED Notes (Signed)
Pt given menu to order breakfast.

## 2020-09-17 NOTE — Progress Notes (Signed)
ANTICOAGULATION CONSULT NOTE -   Pharmacy Consult for Heparin Indication: ACS  Allergies  Allergen Reactions  . Nsaids Other (See Comments)    Kidney disease    Patient Measurements: Height: 5\' 3"  (160 cm) Weight: 49.9 kg (110 lb) IBW/kg (Calculated) : 56.9 HEPARIN DW (KG): 49.9  Vital Signs: Temp: 98.8 F (37.1 C) (10/02 0719) Temp Source: Oral (10/02 0719) BP: 123/75 (10/02 0900) Pulse Rate: 61 (10/02 0900)  Labs: Recent Labs    09/16/20 1123 09/17/20 0100 09/17/20 0244 09/17/20 0521 09/17/20 0830  HGB 13.4  --   --  11.6*  --   HCT 36.2*  --   --  33.2*  --   PLT 172  --   --  141*  --   APTT  --   --  >160*  --   --   LABPROT  --   --  11.6  --   --   INR  --   --  0.9  --   --   HEPARINUNFRC  --   --   --   --  0.27*  CREATININE 1.31*  --   --  1.04  --   TROPONINIHS 32* 120*  --  146*  --     Estimated Creatinine Clearance: 52.6 mL/min (by C-G formula based on SCr of 1.04 mg/dL).   Medical History: Past Medical History:  Diagnosis Date  . Anemia   . GERD (gastroesophageal reflux disease)   . Hypertension   . Hyponatremia    chronic  . Kidney disease   . Migraine   . Seizures ([place])     Medications:  (Not in a hospital admission)   Assessment: No anticoagulants PTA.  Heparin for ACS.   Initial orders per MD  Goal of Therapy:  Heparin level 0.3-0.7 units/ml Monitor platelets by anticoagulation protocol: Yes   Plan:  10/2 HL @ 0830=0.27. Will Heparin bolus 1000 units then increase infusion to 800 units/hr reCheck HL ~ 6 hours after heparin rate change  Chadwick Reiswig A 09/17/2020,10:27 AM

## 2020-09-17 NOTE — ED Notes (Signed)
Lab called with critical result of troponin 120. Dr. Beather Arbour notified. No new orders at this time.

## 2020-09-17 NOTE — ED Notes (Addendum)
Admitting MD notified of critical lab value of aPTT > 160. Provider advised to hold heparin drip for two hours.

## 2020-09-18 ENCOUNTER — Other Ambulatory Visit: Payer: Self-pay

## 2020-09-18 LAB — CBC
HCT: 30.3 % — ABNORMAL LOW (ref 39.0–52.0)
Hemoglobin: 10.5 g/dL — ABNORMAL LOW (ref 13.0–17.0)
MCH: 33.4 pg (ref 26.0–34.0)
MCHC: 34.7 g/dL (ref 30.0–36.0)
MCV: 96.5 fL (ref 80.0–100.0)
Platelets: 132 10*3/uL — ABNORMAL LOW (ref 150–400)
RBC: 3.14 MIL/uL — ABNORMAL LOW (ref 4.22–5.81)
RDW: 13.2 % (ref 11.5–15.5)
WBC: 4 10*3/uL (ref 4.0–10.5)
nRBC: 0 % (ref 0.0–0.2)

## 2020-09-18 LAB — HEPARIN LEVEL (UNFRACTIONATED): Heparin Unfractionated: 0.87 IU/mL — ABNORMAL HIGH (ref 0.30–0.70)

## 2020-09-18 LAB — ECHOCARDIOGRAM COMPLETE
AR max vel: 2.76 cm2
AV Area VTI: 3.11 cm2
AV Area mean vel: 2.75 cm2
AV Mean grad: 3 mmHg
AV Peak grad: 5.2 mmHg
Ao pk vel: 1.14 m/s
Area-P 1/2: 3.12 cm2
Height: 63 in
S' Lateral: 2 cm
Weight: 1760 oz

## 2020-09-18 LAB — TROPONIN I (HIGH SENSITIVITY)
Troponin I (High Sensitivity): 51 ng/L — ABNORMAL HIGH (ref <18)
Troponin I (High Sensitivity): 29 ng/L — ABNORMAL HIGH (ref <18)

## 2020-09-18 MED ORDER — SODIUM CHLORIDE 0.9% FLUSH
10.0000 mL | Freq: Two times a day (BID) | INTRAVENOUS | Status: DC
  Administered 2020-09-18 – 2020-09-19 (×2): 10 mL via INTRAVENOUS

## 2020-09-18 MED ORDER — INFLUENZA VAC SPLIT QUAD 0.5 ML IM SUSY
0.5000 mL | PREFILLED_SYRINGE | INTRAMUSCULAR | Status: AC
  Administered 2020-09-19: 0.5 mL via INTRAMUSCULAR
  Filled 2020-09-18: qty 0.5

## 2020-09-18 NOTE — ED Notes (Signed)
Rate change on heparin verified by Foster Simpson.

## 2020-09-18 NOTE — Progress Notes (Signed)
Scott, RPh called to inquire about fractionated hep levels for this pt. RN explained that 1st lab draw hemolyzed, per call from lab. RN has just recently drawn 2nd time. He will wait for new result to determine if therapeutic.

## 2020-09-18 NOTE — Progress Notes (Signed)
ANTICOAGULATION CONSULT NOTE -   Pharmacy Consult for Heparin Indication: ACS  Allergies  Allergen Reactions  . Nsaids Other (See Comments)    Kidney disease    Patient Measurements: Height: 5\' 3"  (160 cm) Weight: 49.9 kg (110 lb) IBW/kg (Calculated) : 56.9 HEPARIN DW (KG): 49.9  Vital Signs: Temp: 98.2 F (36.8 C) (10/02 2130) Temp Source: Oral (10/02 2130) BP: 102/74 (10/03 0300) Pulse Rate: 64 (10/03 0300)  Labs: Recent Labs    09/16/20 1123 09/16/20 1123 09/17/20 0100 09/17/20 0244 09/17/20 0521 09/17/20 0830 09/17/20 1128 09/17/20 1750 09/18/20 0412  HGB 13.4   < >  --   --  11.6*  --   --   --  10.5*  HCT 36.2*  --   --   --  33.2*  --   --   --  30.3*  PLT 172  --   --   --  141*  --   --   --  132*  APTT  --   --   --  >160*  --   --   --   --   --   LABPROT  --   --   --  11.6  --   --   --   --   --   INR  --   --   --  0.9  --   --   --   --   --   HEPARINUNFRC  --   --   --   --   --  0.27*  --  0.55 0.87*  CREATININE 1.31*  --   --   --  1.04  --   --   --   --   TROPONINIHS 32*   < >   < >  --  146*  --  120*  --  51*   < > = values in this interval not displayed.    Estimated Creatinine Clearance: 52.6 mL/min (by C-G formula based on SCr of 1.04 mg/dL).   Medical History: Past Medical History:  Diagnosis Date  . Anemia   . GERD (gastroesophageal reflux disease)   . Hypertension   . Hyponatremia    chronic  . Kidney disease   . Migraine   . Seizures ([place])     Medications:  (Not in a hospital admission)   Assessment: No anticoagulants PTA.  Heparin for ACS.   Initial orders per MD  10/2 HL @ 0830=0.27. bolused 1000 units then increased infusion to 800 units/hr 10/3 HL @ 0412 = 0.87, SUPRAtherapeutic.  CBC stable.  Goal of Therapy:  Heparin level 0.3-0.7 units/ml Monitor platelets by anticoagulation protocol: Yes   Plan:  HL SUPRAtherapeutic, decrease Heparin to 600 units/hr and recheck HL in 6 hours  Daily CBC's  Ena Dawley, PharmD Clinical Pharmacist 09/18/2020 5:45 AM

## 2020-09-18 NOTE — Progress Notes (Signed)
[facility] Cardiology    SUBJECTIVE: Patient states to feel reasonably well denies any cardiac type symptoms still has some soreness in his wrist his arm and his buttocks from the fall no difficulty breathing no palpitations no tachycardia no shortness of breath.   Vitals:   09/18/20 0215 09/18/20 0300 09/18/20 0500 09/18/20 0621  BP: 98/70 102/74 114/75 114/75  Pulse: (!) 55 64  62  Resp: 14 17 10 17   Temp:      TempSrc:      SpO2: 100% 100%  92%  Weight:      Height:         Intake/Output Summary (Last 24 hours) at 09/18/2020 0934 Last data filed at 09/18/2020 0444 Gross per 24 hour  Intake --  Output 980 ml  Net -980 ml      PHYSICAL EXAM  General: Well developed, well nourished, in no acute distress HEENT:  Normocephalic and atramatic Neck:  No JVD.  Lungs: Clear bilaterally to auscultation and percussion. Heart: HRRR . Normal S1 and S2 without gallops or murmurs.  Abdomen: Bowel sounds are positive, abdomen soft and non-tender  Msk:  Back normal, normal gait. Normal strength and tone for age. Extremities: No clubbing, cyanosis or edema.  Generalized weakness contractures Neuro: Alert and oriented X 3.  Extremity decrease reflexes Psych:  Good affect, responds appropriately   LABS: Basic Metabolic Panel: Recent Labs    09/16/20 1123 09/17/20 0521  NA 128* 130*  K 4.7 4.5  CL 92* 99  CO2 25 22  GLUCOSE 140* 95  BUN 23 21  CREATININE 1.31* 1.04  CALCIUM 9.4 8.2*   Liver Function Tests: No results for input(s): AST, ALT, ALKPHOS, BILITOT, PROT, ALBUMIN in the last 72 hours. No results for input(s): LIPASE, AMYLASE in the last 72 hours. CBC: Recent Labs    09/17/20 0521 09/18/20 0412  WBC 6.6 4.0  HGB 11.6* 10.5*  HCT 33.2* 30.3*  MCV 96.8 96.5  PLT 141* 132*   Cardiac Enzymes: No results for input(s): CKTOTAL, CKMB, CKMBINDEX, TROPONINI in the last 72 hours. BNP: Invalid input(s): POCBNP D-Dimer: No results for input(s): DDIMER in the last 72  hours. Hemoglobin A1C: No results for input(s): HGBA1C in the last 72 hours. Fasting Lipid Panel: Recent Labs    09/17/20 0521  CHOL 168  HDL 51  LDLCALC 105*  TRIG 60  CHOLHDL 3.3   Thyroid Function Tests: No results for input(s): TSH, T4TOTAL, T3FREE, THYROIDAB in the last 72 hours.  Invalid input(s): FREET3 Anemia Panel: No results for input(s): VITAMINB12, FOLATE, FERRITIN, TIBC, IRON, RETICCTPCT in the last 72 hours.  DG Wrist Complete Left  Result Date: 09/16/2020 CLINICAL DATA:  Fall and pain EXAM: LEFT WRIST - COMPLETE 3+ VIEW COMPARISON:  None. FINDINGS: There is no evidence of fracture or dislocation. There is no evidence of arthropathy or other focal bone abnormality. Mild dorsal soft tissue swelling. IMPRESSION: Negative. Electronically Signed   By: Prudencio Pair M.D.   On: 09/16/2020 23:44   DG Chest Port 1 View  Result Date: 09/17/2020 CLINICAL DATA:  Dizziness EXAM: PORTABLE CHEST 1 VIEW COMPARISON:  06/19/2020 FINDINGS: The heart size and mediastinal contours are within normal limits. Both lungs are clear. The visualized skeletal structures are unremarkable. IMPRESSION: No active disease. Electronically Signed   By: Ulyses Jarred M.D.   On: 09/17/2020 03:56   DG Knee Complete 4 Views Left  Result Date: 09/16/2020 CLINICAL DATA:  Fall and pain EXAM: LEFT KNEE - COMPLETE  4+ VIEW COMPARISON:  None. FINDINGS: No evidence of fracture, dislocation, or joint effusion. No evidence of arthropathy or other focal bone abnormality. Soft tissues are unremarkable. IMPRESSION: Negative. Electronically Signed   By: Prudencio Pair M.D.   On: 09/16/2020 23:44     Echo preserved left ventricular function ejection fraction of at least 60%  TELEMETRY: Normal sinus rhythm nonspecific ST-T wave changes:  ASSESSMENT AND PLAN:  Active Problems:   Elevated troponin Accidental fall Demand ischemia this is not a non-STEMI Generalized body aches GERD Seizure disorder Hypertensive  urgency resolved  Plan Okay to discontinue telemetry Troponins do not represent a non-STEMI probably just demand ischemia from the fall Recommend conservative cardiac input at this point Continue blood pressure management and control Maintain seizure therapy Have the patient follow-up with cardiology as an outpatient   Yolonda Kida, MD 09/18/2020 9:34 AM

## 2020-09-18 NOTE — ED Notes (Signed)
Patient is resting on left side. Awakened him to turn on back or right side to allow BP to stay within normal limits on monitor. Breathing is even and unlabored. No complaints of pain. Will continue to monitor.
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
## 2020-09-19 MED ORDER — INFLUENZA VAC SPLIT QUAD 0.5 ML IM SUSY: 0.5000 mL | PREFILLED_SYRINGE | INTRAMUSCULAR | 0 refills | Status: AC
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
## 2020-09-19 NOTE — Plan of Care (Signed)
  Problem: Education: Goal: Knowledge of General Education information will improve Description: Including pain rating scale, medication(s)/side effects and non-pharmacologic comfort measures Outcome: Progressing Note: Patient profile completed. Patient's medication were sent to pharmacy. Patient is in agreement with plan of care. Friends were notified of his hospital admission.
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
## 2020-09-19 NOTE — TOC Progression Note (Addendum)
Transition of Care [facility]) - Progression Note    Patient Details  Name: [name] MRN: [id number] Date of Birth: 08/23/1961  Transition of Care [facility]) CM/SW [place], RN Phone Number: 09/19/2020, 1:56 PM  Clinical Narrative:     Unable to locate a Home Health agency that can see this patient at discharge. ([facility], [facility], [facility])  Will refer to PCP.      Expected Discharge Plan and Services           Expected Discharge Date: 09/19/20                                     Social Determinants of Health (SDOH) Interventions    Readmission Risk Interventions No flowsheet data found.

## 2020-09-22 ENCOUNTER — Encounter: Payer: Self-pay | Admitting: Emergency Medicine

## 2020-09-22 ENCOUNTER — Other Ambulatory Visit: Payer: Self-pay

## 2020-09-22 DIAGNOSIS — Z7983 Long term (current) use of bisphosphonates: Secondary | ICD-10-CM

## 2020-09-22 DIAGNOSIS — E785 Hyperlipidemia, unspecified: Secondary | ICD-10-CM | POA: Diagnosis present

## 2020-09-22 DIAGNOSIS — Z20822 Contact with and (suspected) exposure to covid-19: Secondary | ICD-10-CM | POA: Diagnosis present

## 2020-09-22 DIAGNOSIS — Z886 Allergy status to analgesic agent status: Secondary | ICD-10-CM

## 2020-09-22 DIAGNOSIS — Z79899 Other long term (current) drug therapy: Secondary | ICD-10-CM

## 2020-09-22 DIAGNOSIS — I129 Hypertensive chronic kidney disease with stage 1 through stage 4 chronic kidney disease, or unspecified chronic kidney disease: Secondary | ICD-10-CM | POA: Diagnosis present

## 2020-09-22 DIAGNOSIS — N182 Chronic kidney disease, stage 2 (mild): Secondary | ICD-10-CM | POA: Diagnosis present

## 2020-09-22 DIAGNOSIS — I959 Hypotension, unspecified: Secondary | ICD-10-CM | POA: Diagnosis not present

## 2020-09-22 DIAGNOSIS — D649 Anemia, unspecified: Secondary | ICD-10-CM | POA: Diagnosis present

## 2020-09-22 DIAGNOSIS — Z841 Family history of disorders of kidney and ureter: Secondary | ICD-10-CM

## 2020-09-22 DIAGNOSIS — K219 Gastro-esophageal reflux disease without esophagitis: Secondary | ICD-10-CM | POA: Diagnosis present

## 2020-09-22 DIAGNOSIS — N400 Enlarged prostate without lower urinary tract symptoms: Secondary | ICD-10-CM | POA: Diagnosis present

## 2020-09-22 DIAGNOSIS — Z8249 Family history of ischemic heart disease and other diseases of the circulatory system: Secondary | ICD-10-CM

## 2020-09-22 DIAGNOSIS — E861 Hypovolemia: Secondary | ICD-10-CM | POA: Diagnosis present

## 2020-09-22 DIAGNOSIS — E871 Hypo-osmolality and hyponatremia: Principal | ICD-10-CM | POA: Diagnosis present

## 2020-09-22 DIAGNOSIS — D696 Thrombocytopenia, unspecified: Secondary | ICD-10-CM | POA: Diagnosis present

## 2020-09-22 DIAGNOSIS — G40909 Epilepsy, unspecified, not intractable, without status epilepticus: Secondary | ICD-10-CM | POA: Diagnosis present

## 2020-09-22 MED ORDER — DIVALPROEX SODIUM 500 MG PO DR TAB
750.0000 mg | DELAYED_RELEASE_TABLET | Freq: Once | ORAL | Status: AC
  Administered 2020-09-22: 750 mg via ORAL
  Filled 2020-09-22: qty 1

## 2020-09-22 MED ORDER — CARBAMAZEPINE 200 MG PO TABS
400.0000 mg | ORAL_TABLET | Freq: Once | ORAL | Status: AC
  Administered 2020-09-22: 400 mg via ORAL
  Filled 2020-09-22: qty 2

## 2020-09-22 MED ORDER — SODIUM CHLORIDE 1 G PO TABS
1.0000 g | ORAL_TABLET | Freq: Once | ORAL | Status: AC
  Administered 2020-09-23: 1 g via ORAL
  Filled 2020-09-22: qty 1

## 2020-09-22 NOTE — ED Triage Notes (Signed)
Pt to ED via EMS from home c/o generalized weakness today in bilateral forearms, denies pain or recent event causing weakness.  States was unable to open his pill bottles for his nightly dose and that's why EMS was called.  Pt states he takes tegretol 200mg , depakote 750mg , sodium chloride 1g.  Pt insistent in triage that he takes his life essential medications now, yelling in triage.

## 2020-09-23 ENCOUNTER — Inpatient Hospital Stay: Payer: Medicaid Other

## 2020-09-23 ENCOUNTER — Observation Stay: Payer: Medicaid Other

## 2020-09-23 ENCOUNTER — Inpatient Hospital Stay
Admission: EM | Admit: 2020-09-23 | Discharge: 2020-10-01 | DRG: 641 | Disposition: A | Discharge status: Home Health | Payer: Medicaid Other | Attending: Hospitalist | Admitting: Hospitalist

## 2020-09-23 DIAGNOSIS — I10 Essential (primary) hypertension: Secondary | ICD-10-CM | POA: Diagnosis present

## 2020-09-23 DIAGNOSIS — N182 Chronic kidney disease, stage 2 (mild): Secondary | ICD-10-CM | POA: Diagnosis present

## 2020-09-23 DIAGNOSIS — E871 Hypo-osmolality and hyponatremia: Secondary | ICD-10-CM | POA: Diagnosis not present

## 2020-09-23 DIAGNOSIS — Z8249 Family history of ischemic heart disease and other diseases of the circulatory system: Secondary | ICD-10-CM | POA: Diagnosis not present

## 2020-09-23 DIAGNOSIS — G40909 Epilepsy, unspecified, not intractable, without status epilepticus: Secondary | ICD-10-CM | POA: Diagnosis present

## 2020-09-23 DIAGNOSIS — Z841 Family history of disorders of kidney and ureter: Secondary | ICD-10-CM | POA: Diagnosis not present

## 2020-09-23 DIAGNOSIS — I129 Hypertensive chronic kidney disease with stage 1 through stage 4 chronic kidney disease, or unspecified chronic kidney disease: Secondary | ICD-10-CM | POA: Diagnosis present

## 2020-09-23 DIAGNOSIS — K219 Gastro-esophageal reflux disease without esophagitis: Secondary | ICD-10-CM | POA: Diagnosis present

## 2020-09-23 DIAGNOSIS — I959 Hypotension, unspecified: Secondary | ICD-10-CM | POA: Diagnosis not present

## 2020-09-23 DIAGNOSIS — D696 Thrombocytopenia, unspecified: Secondary | ICD-10-CM | POA: Diagnosis present

## 2020-09-23 DIAGNOSIS — D649 Anemia, unspecified: Secondary | ICD-10-CM

## 2020-09-23 DIAGNOSIS — E785 Hyperlipidemia, unspecified: Secondary | ICD-10-CM | POA: Diagnosis present

## 2020-09-23 DIAGNOSIS — E861 Hypovolemia: Secondary | ICD-10-CM | POA: Diagnosis present

## 2020-09-23 DIAGNOSIS — Z7983 Long term (current) use of bisphosphonates: Secondary | ICD-10-CM | POA: Diagnosis not present

## 2020-09-23 DIAGNOSIS — R531 Weakness: Secondary | ICD-10-CM

## 2020-09-23 DIAGNOSIS — N400 Enlarged prostate without lower urinary tract symptoms: Secondary | ICD-10-CM | POA: Diagnosis present

## 2020-09-23 DIAGNOSIS — Z886 Allergy status to analgesic agent status: Secondary | ICD-10-CM | POA: Diagnosis not present

## 2020-09-23 DIAGNOSIS — Z20822 Contact with and (suspected) exposure to covid-19: Secondary | ICD-10-CM | POA: Diagnosis present

## 2020-09-23 DIAGNOSIS — R569 Unspecified convulsions: Secondary | ICD-10-CM | POA: Diagnosis not present

## 2020-09-23 DIAGNOSIS — Z79899 Other long term (current) drug therapy: Secondary | ICD-10-CM | POA: Diagnosis not present

## 2020-09-23 DIAGNOSIS — N1832 Anemia in chronic kidney disease: Secondary | ICD-10-CM

## 2020-09-23 LAB — COMPREHENSIVE METABOLIC PANEL
ALT: 20 U/L (ref 0–44)
AST: 29 U/L (ref 15–41)
Albumin: 3.6 g/dL (ref 3.5–5.0)
Alkaline Phosphatase: 30 U/L — ABNORMAL LOW (ref 38–126)
Anion gap: 10 (ref 5–15)
BUN: 22 mg/dL (ref 8–23)
CO2: 24 mmol/L (ref 22–32)
Calcium: 9.1 mg/dL (ref 8.9–10.3)
Chloride: 88 mmol/L — ABNORMAL LOW (ref 98–111)
Creatinine, Ser: 1.03 mg/dL (ref 0.61–1.24)
GFR calc non Af Amer: >60 mL/min (ref >60)
Glucose, Bld: 105 mg/dL — ABNORMAL HIGH (ref 70–99)
Potassium: 5 mmol/L (ref 3.5–5.1)
Sodium: 122 mmol/L — ABNORMAL LOW (ref 135–145)
Total Bilirubin: 0.9 mg/dL (ref 0.3–1.2)
Total Protein: 6.8 g/dL (ref 6.5–8.1)

## 2020-09-23 LAB — OSMOLALITY: Osmolality: 254 mOsm/kg — ABNORMAL LOW (ref 275–295)

## 2020-09-23 LAB — RESPIRATORY PANEL BY RT PCR (FLU A&B, COVID)
Influenza A by PCR: NEGATIVE
Influenza B by PCR: NEGATIVE
SARS Coronavirus 2 by RT PCR: NEGATIVE

## 2020-09-23 LAB — CBC WITH DIFFERENTIAL/PLATELET
Abs Immature Granulocytes: 0.02 10*3/uL (ref 0.00–0.07)
Basophils Absolute: 0 10*3/uL (ref 0.0–0.1)
Basophils Relative: 0 %
Eosinophils Absolute: 0 10*3/uL (ref 0.0–0.5)
Eosinophils Relative: 0 %
HCT: 32.5 % — ABNORMAL LOW (ref 39.0–52.0)
Hemoglobin: 11.5 g/dL — ABNORMAL LOW (ref 13.0–17.0)
Immature Granulocytes: 0 %
Lymphocytes Relative: 17 %
Lymphs Abs: 0.8 10*3/uL (ref 0.7–4.0)
MCH: 33.5 pg (ref 26.0–34.0)
MCHC: 35.4 g/dL (ref 30.0–36.0)
MCV: 94.8 fL (ref 80.0–100.0)
Monocytes Absolute: 0.5 10*3/uL (ref 0.1–1.0)
Monocytes Relative: 10 %
Neutro Abs: 3.3 10*3/uL (ref 1.7–7.7)
Neutrophils Relative %: 73 %
Platelets: 146 10*3/uL — ABNORMAL LOW (ref 150–400)
RBC: 3.43 MIL/uL — ABNORMAL LOW (ref 4.22–5.81)
RDW: 12.5 % (ref 11.5–15.5)
WBC: 4.6 10*3/uL (ref 4.0–10.5)
nRBC: 0 % (ref 0.0–0.2)

## 2020-09-23 LAB — TROPONIN I (HIGH SENSITIVITY): Troponin I (High Sensitivity): 6 ng/L (ref <18)

## 2020-09-23 LAB — SODIUM: Sodium: 121 mmol/L — ABNORMAL LOW (ref 135–145)

## 2020-09-23 MED ORDER — ONDANSETRON HCL 4 MG PO TABS: 4.0000 mg | ORAL_TABLET | Freq: Four times a day (QID) | ORAL | Status: DC | PRN

## 2020-09-23 MED ORDER — ATORVASTATIN CALCIUM 20 MG PO TABS
80.0000 mg | ORAL_TABLET | Freq: Every day | ORAL | Status: DC
  Administered 2020-09-23 – 2020-10-01 (×9): 80 mg via ORAL
  Filled 2020-09-23 (×2): qty 1
  Filled 2020-09-23: qty 4
  Filled 2020-09-23 (×2): qty 1
  Filled 2020-09-23: qty 4
  Filled 2020-09-23 (×3): qty 1

## 2020-09-23 MED ORDER — SODIUM CHLORIDE 0.9 % IV BOLUS
1000.0000 mL | Freq: Once | INTRAVENOUS | Status: AC
  Administered 2020-09-23: 1000 mL via INTRAVENOUS

## 2020-09-23 MED ORDER — TAMSULOSIN HCL 0.4 MG PO CAPS
0.4000 mg | ORAL_CAPSULE | Freq: Every day | ORAL | Status: DC
  Administered 2020-09-23 – 2020-10-01 (×9): 0.4 mg via ORAL
  Filled 2020-09-23 (×9): qty 1

## 2020-09-23 MED ORDER — ACETAMINOPHEN 650 MG RE SUPP: 650.0000 mg | Freq: Four times a day (QID) | RECTAL | Status: DC | PRN

## 2020-09-23 MED ORDER — ONDANSETRON HCL 4 MG/2ML IJ SOLN: 4.0000 mg | Freq: Four times a day (QID) | INTRAMUSCULAR | Status: DC | PRN

## 2020-09-23 MED ORDER — SODIUM CHLORIDE 1 G PO TABS
1.0000 g | ORAL_TABLET | Freq: Every day | ORAL | Status: DC
  Administered 2020-09-23 – 2020-09-26 (×4): 1 g via ORAL
  Filled 2020-09-23 (×4): qty 1

## 2020-09-23 MED ORDER — ACETAMINOPHEN 325 MG PO TABS: 650.0000 mg | ORAL_TABLET | Freq: Four times a day (QID) | ORAL | Status: DC | PRN

## 2020-09-23 MED ORDER — SODIUM CHLORIDE 0.9 % IV SOLN: INTRAVENOUS | Status: DC

## 2020-09-23 MED ORDER — ENOXAPARIN SODIUM 40 MG/0.4ML ~~LOC~~ SOLN
40.0000 mg | SUBCUTANEOUS | Status: DC
  Administered 2020-09-23 – 2020-09-26 (×4): 40 mg via SUBCUTANEOUS
  Filled 2020-09-23 (×6): qty 0.4

## 2020-09-23 MED ORDER — DIVALPROEX SODIUM 500 MG PO DR TAB
750.0000 mg | DELAYED_RELEASE_TABLET | Freq: Two times a day (BID) | ORAL | Status: DC
  Administered 2020-09-23 – 2020-10-01 (×17): 750 mg via ORAL
  Filled 2020-09-23 (×18): qty 1

## 2020-09-23 MED ORDER — LISINOPRIL 5 MG PO TABS
5.0000 mg | ORAL_TABLET | Freq: Every day | ORAL | Status: DC
  Administered 2020-09-23: 5 mg via ORAL
  Filled 2020-09-23: qty 1

## 2020-09-23 MED ORDER — IOHEXOL 300 MG/ML  SOLN
75.0000 mL | Freq: Once | INTRAMUSCULAR | Status: AC | PRN
  Administered 2020-09-23: 75 mL via INTRAVENOUS

## 2020-09-23 MED ORDER — PROPRANOLOL HCL 20 MG PO TABS
20.0000 mg | ORAL_TABLET | Freq: Two times a day (BID) | ORAL | Status: DC
  Administered 2020-09-23 (×2): 20 mg via ORAL
  Filled 2020-09-23 (×4): qty 1

## 2020-09-23 MED ORDER — CARBAMAZEPINE 200 MG PO TABS
400.0000 mg | ORAL_TABLET | Freq: Two times a day (BID) | ORAL | Status: DC
  Administered 2020-09-23 – 2020-09-26 (×7): 400 mg via ORAL
  Filled 2020-09-23 (×8): qty 2

## 2020-09-23 NOTE — ED Notes (Signed)
Fed pt breakfast °

## 2020-09-23 NOTE — Progress Notes (Addendum)
[facility]  ROUNDING NOTE   Subjective:   [name] is a 59 year old gentleman with past medical history of chronic hyponatremia, seizures, hypertension, chronic anemia, GERD and CKD stage II Who presented to the emergency room with increased generalized weakness and fatigue.  Blood work revealed hyponatremia with sodium of 122, nephrology consulted for the management.  Objective:  Vital signs in last 24 hours:  Temp:  [98.2 F (36.8 C)] 98.2 F (36.8 C) (10/07 2343) Pulse Rate:  [67-83] 67 (10/08 1200) Resp:  [10-18] 14 (10/08 1200) BP: (108-166)/(72-112) 108/91 (10/08 1200) SpO2:  [100 %] 100 % (10/08 1200) Weight:  [54.4 kg] 54.4 kg (10/07 2343)  Weight change:  Filed Weights   09/22/20 2343  Weight: 54.4 kg    Intake/Output: No intake/output data recorded.   Intake/Output this shift:  No intake/output data recorded.  Physical Exam: General:  Resting in bed with eyes closed,, arousable to call  Head: Normocephalic, atraumatic. Moist oral mucosal membranes  Eyes: Anicteric  Neck: Supple  Lungs:  Clear to auscultation bilaterally, normal and symmetric respiratory effort  Heart:  S1S2 regular rate and rhythm  Abdomen:  Soft, nontender, nondistended  Extremities:  No peripheral edema.  Neurologic:  Alert, oriented  Skin: No acute lesions or rashes    Basic Metabolic Panel: Recent Labs  Lab 09/17/20 0521 09/23/20 0230 09/23/20 0557  NA 130* 122* 121*  K 4.5 5.0  --   CL 99 88*  --   CO2 22 24  --   GLUCOSE 95 105*  --   BUN 21 22  --   CREATININE 1.04 1.03  --   CALCIUM 8.2* 9.1  --     Liver Function Tests: Recent Labs  Lab 09/23/20 0230  AST 29  ALT 20  ALKPHOS 30*  BILITOT 0.9  PROT 6.8  ALBUMIN 3.6   No results for input(s): LIPASE, AMYLASE in the last 168 hours. No results for input(s): AMMONIA in the last 168 hours.  CBC: Recent Labs  Lab 09/17/20 0521 09/18/20 0412 09/23/20 0230  WBC 6.6 4.0 4.6  NEUTROABS  --   --   3.3  HGB 11.6* 10.5* 11.5*  HCT 33.2* 30.3* 32.5*  MCV 96.8 96.5 94.8  PLT 141* 132* 146*    Cardiac Enzymes: No results for input(s): CKTOTAL, CKMB, CKMBINDEX, TROPONINI in the last 168 hours.  BNP: Invalid input(s): POCBNP  CBG: Recent Labs  Lab 09/16/20 2212  GLUCAP 118*    Microbiology: Results for orders placed or performed during the hospital encounter of 09/23/20  Respiratory Panel by RT PCR (Flu A&B, Covid) - Nasopharyngeal Swab     Status: None   Collection Time: 09/23/20  5:57 AM   Specimen: Nasopharyngeal Swab  Result Value Ref Range Status   SARS Coronavirus 2 by RT PCR NEGATIVE NEGATIVE Final    Comment: (NOTE) SARS-CoV-2 target nucleic acids are NOT DETECTED.  The SARS-CoV-2 RNA is generally detectable in upper respiratoy specimens during the acute phase of infection. The lowest concentration of SARS-CoV-2 viral copies this assay can detect is 131 copies/mL. A negative result does not preclude SARS-Cov-2 infection and should not be used as the sole basis for treatment or other patient management decisions. A negative result may occur with  improper specimen collection/handling, submission of specimen other than nasopharyngeal swab, presence of viral mutation(s) within the areas targeted by this assay, and inadequate number of viral copies (<131 copies/mL). A negative result must be combined with clinical observations, patient  history, and epidemiological information. The expected result is Negative.  Fact Sheet for Patients:  PinkCheek.be  Fact Sheet for Healthcare Providers:  GravelBags.it  This test is no t yet approved or cleared by the [place] FDA and  has been authorized for detection and/or diagnosis of SARS-CoV-2 by FDA under an Emergency Use Authorization (EUA). This EUA will remain  in effect (meaning this test can be used) for the duration of the COVID-19 declaration under  Section 564(b)(1) of the Act, 21 U.S.C. section 360bbb-3(b)(1), unless the authorization is terminated or revoked sooner.     Influenza A by PCR NEGATIVE NEGATIVE Final   Influenza B by PCR NEGATIVE NEGATIVE Final    Comment: (NOTE) The Xpert Xpress SARS-CoV-2/FLU/RSV assay is intended as an aid in  the diagnosis of influenza from Nasopharyngeal swab specimens and  should not be used as a sole basis for treatment. Nasal washings and  aspirates are unacceptable for Xpert Xpress SARS-CoV-2/FLU/RSV  testing.  Fact Sheet for Patients: PinkCheek.be  Fact Sheet for Healthcare Providers: GravelBags.it  This test is not yet approved or cleared by the [place] FDA and  has been authorized for detection and/or diagnosis of SARS-CoV-2 by  FDA under an Emergency Use Authorization (EUA). This EUA will remain  in effect (meaning this test can be used) for the duration of the  Covid-19 declaration under Section 564(b)(1) of the Act, 21  U.S.C. section 360bbb-3(b)(1), unless the authorization is  terminated or revoked. Performed at [facility], [address] [place], [place] [address]     Coagulation Studies: No results for input(s): LABPROT, INR in the last 72 hours.  Urinalysis: No results for input(s): COLORURINE, LABSPEC, PHURINE, GLUCOSEU, HGBUR, BILIRUBINUR, KETONESUR, PROTEINUR, UROBILINOGEN, NITRITE, LEUKOCYTESUR in the last 72 hours.  Invalid input(s): APPERANCEUR    Imaging: No results found.   Medications:   . sodium chloride 100 mL/hr at 09/23/20 0522   . atorvastatin  80 mg Oral Daily  . carbamazepine  400 mg Oral BID  . divalproex  750 mg Oral Q12H  . enoxaparin (LOVENOX) injection  40 mg Subcutaneous Q24H  . lisinopril  5 mg Oral QHS  . propranolol  20 mg Oral BID  . sodium chloride  1 g Oral Daily  . tamsulosin  0.4 mg Oral Daily   acetaminophen **OR** acetaminophen, ondansetron **OR**  ondansetron (ZOFRAN) IV  Assessment/ Plan:  [name] is a 59 y.o.  male  with past medical history of chronic hyponatremia, seizures, hypertension, chronic anemia, GERD and CKD stage II Who presented to the emergency room with increased generalized weakness and fatigue.  Blood work revealed hyponatremia with sodium of 122, nephrology consulted for the management.  # Hyponatremia Na+ 122 today Appears euvolemic -Treated in ED with sodium chloride tablets -IV normal saline 1000 ml boluses x2 And IV NS at 100 mL/h on flow now -Patient reports poor  nutritional intake -Will order CT chest with contrast to evaluate for lung masses,possible cause of hyponatremia  #Chronic kidney disease stage II  Renal function at baseline Creatinine 1.03 BUN 22 GFR >60 We will continue follow-up  #Anemia of kidney disease Hemoglobin at the goal, 11.5 today   LOS: 0 Princy Raju 10/8/20211:10 PM

## 2020-09-23 NOTE — Progress Notes (Signed)
PROGRESS NOTE    [name]  WCB:[id number] DOB: 29-Apr-1961 DOA: 09/23/2020 PCP: [name], MD  Assessment & Plan:   Principal Problem:   Chronic hyponatremia Active Problems:   Essential hypertension   Generalized weakness   CKD (chronic kidney disease) stage 2, GFR 60-89 ml/min   Acute hyponatremia   Chronic anemia   Seizure disorder (HCC)   Acute on chronic hyponatremia: w/ hx of hyperosmolality. Will continue on NaCl tabs. Nephro following and recs apprec   Generalized weakness: likely secondary to hyponatremia   HTN: continue on home dose of propranolol, lisinopril  BPH: will continue on home dose of tamsulosin  HLD: continue on statin   CKDII: stable. Will continue to monitor   Normocytic anemia: no need for a transfusion at this time. Stable   Thrombocytopenia: etiology unclear. Will continue to monitor   Seizure disorder: will continue on home dose of depakote, carbamazepine   DVT prophylaxis: lovenox  Code Status:  Full  Family Communication:  Disposition Plan: depends on PT/OT recs  Consultants:   nephro    Procedures:    Antimicrobials:    Subjective: Pt c/o weakness   Objective: Vitals:   09/23/20 0537 09/23/20 0630 09/23/20 0645 09/23/20 0700  BP: (!) 149/112 (!) 153/72  140/72  Pulse: 67 70 70 68  Resp: 17 11 10 12   Temp:      TempSrc:      SpO2: 100% 100% 100% 100%  Weight:      Height:       No intake or output data in the 24 hours ending 09/23/20 0745 Filed Weights   09/22/20 2343  Weight: 54.4 kg    Examination:  General exam: Appears calm and uncomfortable. Poor dentition  Respiratory system: Clear to auscultation. Respiratory effort normal. Cardiovascular system: S1 & S2+. No  rubs, gallops or clicks.  Gastrointestinal system: Abdomen is nondistended, soft and nontender. Normal bowel sounds heard. Central nervous system: Alert and oriented. Moves all 4 extremities  Psychiatry: Judgement and insight  appear normal.Flat mood and affect    Data Reviewed: I have personally reviewed following labs and imaging studies  CBC: Recent Labs  Lab 09/16/20 1123 09/17/20 0521 09/18/20 0412 09/23/20 0230  WBC 4.5 6.6 4.0 4.6  NEUTROABS  --   --   --  3.3  HGB 13.4 11.6* 10.5* 11.5*  HCT 36.2* 33.2* 30.3* 32.5*  MCV 92.8 96.8 96.5 94.8  PLT 172 141* 132* 616*   Basic Metabolic Panel: Recent Labs  Lab 09/16/20 1123 09/17/20 0521 09/23/20 0230 09/23/20 0557  NA 128* 130* 122* 121*  K 4.7 4.5 5.0  --   CL 92* 99 88*  --   CO2 25 22 24   --   GLUCOSE 140* 95 105*  --   BUN 23 21 22   --   CREATININE 1.31* 1.04 1.03  --   CALCIUM 9.4 8.2* 9.1  --    GFR: Estimated Creatinine Clearance: 58 mL/min (by C-G formula based on SCr of 1.03 mg/dL). Liver Function Tests: Recent Labs  Lab 09/23/20 0230  AST 29  ALT 20  ALKPHOS 30*  BILITOT 0.9  PROT 6.8  ALBUMIN 3.6   No results for input(s): LIPASE, AMYLASE in the last 168 hours. No results for input(s): AMMONIA in the last 168 hours. Coagulation Profile: Recent Labs  Lab 09/17/20 0244  INR 0.9   Cardiac Enzymes: No results for input(s): CKTOTAL, CKMB, CKMBINDEX, TROPONINI in the last 168 hours. BNP (last 3  results) No results for input(s): PROBNP in the last 8760 hours. HbA1C: No results for input(s): HGBA1C in the last 72 hours. CBG: Recent Labs  Lab 09/16/20 2212  GLUCAP 118*   Lipid Profile: No results for input(s): CHOL, HDL, LDLCALC, TRIG, CHOLHDL, LDLDIRECT in the last 72 hours. Thyroid Function Tests: No results for input(s): TSH, T4TOTAL, FREET4, T3FREE, THYROIDAB in the last 72 hours. Anemia Panel: No results for input(s): VITAMINB12, FOLATE, FERRITIN, TIBC, IRON, RETICCTPCT in the last 72 hours. Sepsis Labs: No results for input(s): PROCALCITON, LATICACIDVEN in the last 168 hours.  Recent Results (from the past 240 hour(s))  Respiratory Panel by RT PCR (Flu A&B, Covid) - Nasopharyngeal Swab     Status:  None   Collection Time: 09/17/20  2:29 AM   Specimen: Nasopharyngeal Swab  Result Value Ref Range Status   SARS Coronavirus 2 by RT PCR NEGATIVE NEGATIVE Final    Comment: (NOTE) SARS-CoV-2 target nucleic acids are NOT DETECTED.  The SARS-CoV-2 RNA is generally detectable in upper respiratoy specimens during the acute phase of infection. The lowest concentration of SARS-CoV-2 viral copies this assay can detect is 131 copies/mL. A negative result does not preclude SARS-Cov-2 infection and should not be used as the sole basis for treatment or other patient management decisions. A negative result may occur with  improper specimen collection/handling, submission of specimen other than nasopharyngeal swab, presence of viral mutation(s) within the areas targeted by this assay, and inadequate number of viral copies (<131 copies/mL). A negative result must be combined with clinical observations, patient history, and epidemiological information. The expected result is Negative.  Fact Sheet for Patients:  PinkCheek.be  Fact Sheet for Healthcare Providers:  GravelBags.it  This test is no t yet approved or cleared by the [place] FDA and  has been authorized for detection and/or diagnosis of SARS-CoV-2 by FDA under an Emergency Use Authorization (EUA). This EUA will remain  in effect (meaning this test can be used) for the duration of the COVID-19 declaration under Section 564(b)(1) of the Act, 21 U.S.C. section 360bbb-3(b)(1), unless the authorization is terminated or revoked sooner.     Influenza A by PCR NEGATIVE NEGATIVE Final   Influenza B by PCR NEGATIVE NEGATIVE Final    Comment: (NOTE) The Xpert Xpress SARS-CoV-2/FLU/RSV assay is intended as an aid in  the diagnosis of influenza from Nasopharyngeal swab specimens and  should not be used as a sole basis for treatment. Nasal washings and  aspirates are unacceptable for  Xpert Xpress SARS-CoV-2/FLU/RSV  testing.  Fact Sheet for Patients: PinkCheek.be  Fact Sheet for Healthcare Providers: GravelBags.it  This test is not yet approved or cleared by the [place] FDA and  has been authorized for detection and/or diagnosis of SARS-CoV-2 by  FDA under an Emergency Use Authorization (EUA). This EUA will remain  in effect (meaning this test can be used) for the duration of the  Covid-19 declaration under Section 564(b)(1) of the Act, 21  U.S.C. section 360bbb-3(b)(1), unless the authorization is  terminated or revoked. Performed at [facility], [address] [place], [place] [address]   Respiratory Panel by RT PCR (Flu A&B, Covid) - Nasopharyngeal Swab     Status: None   Collection Time: 09/23/20  5:57 AM   Specimen: Nasopharyngeal Swab  Result Value Ref Range Status   SARS Coronavirus 2 by RT PCR NEGATIVE NEGATIVE Final    Comment: (NOTE) SARS-CoV-2 target nucleic acids are NOT DETECTED.  The SARS-CoV-2 RNA is  generally detectable in upper respiratoy specimens during the acute phase of infection. The lowest concentration of SARS-CoV-2 viral copies this assay can detect is 131 copies/mL. A negative result does not preclude SARS-Cov-2 infection and should not be used as the sole basis for treatment or other patient management decisions. A negative result may occur with  improper specimen collection/handling, submission of specimen other than nasopharyngeal swab, presence of viral mutation(s) within the areas targeted by this assay, and inadequate number of viral copies (<131 copies/mL). A negative result must be combined with clinical observations, patient history, and epidemiological information. The expected result is Negative.  Fact Sheet for Patients:  PinkCheek.be  Fact Sheet for Healthcare Providers:    GravelBags.it  This test is no t yet approved or cleared by the [place] FDA and  has been authorized for detection and/or diagnosis of SARS-CoV-2 by FDA under an Emergency Use Authorization (EUA). This EUA will remain  in effect (meaning this test can be used) for the duration of the COVID-19 declaration under Section 564(b)(1) of the Act, 21 U.S.C. section 360bbb-3(b)(1), unless the authorization is terminated or revoked sooner.     Influenza A by PCR NEGATIVE NEGATIVE Final   Influenza B by PCR NEGATIVE NEGATIVE Final    Comment: (NOTE) The Xpert Xpress SARS-CoV-2/FLU/RSV assay is intended as an aid in  the diagnosis of influenza from Nasopharyngeal swab specimens and  should not be used as a sole basis for treatment. Nasal washings and  aspirates are unacceptable for Xpert Xpress SARS-CoV-2/FLU/RSV  testing.  Fact Sheet for Patients: PinkCheek.be  Fact Sheet for Healthcare Providers: GravelBags.it  This test is not yet approved or cleared by the [place] FDA and  has been authorized for detection and/or diagnosis of SARS-CoV-2 by  FDA under an Emergency Use Authorization (EUA). This EUA will remain  in effect (meaning this test can be used) for the duration of the  Covid-19 declaration under Section 564(b)(1) of the Act, 21  U.S.C. section 360bbb-3(b)(1), unless the authorization is  terminated or revoked. Performed at [facility], [address], [place], [place] [address]          Radiology Studies: No results found.      Scheduled Meds:  enoxaparin (LOVENOX) injection  40 mg Subcutaneous Q24H   Continuous Infusions:  sodium chloride 100 mL/hr at 09/23/20 0522     LOS: 0 days    Time spent: 31 mins    Wyvonnia Dusky, MD [facility] Pager [phone]  If 7PM-7AM, please contact night-coverage [email or web address] 09/23/2020, 7:45  AM

## 2020-09-23 NOTE — ED Notes (Signed)
Pt changed out patient's clothes at this time per request and placed into hospital gown. Pt comfortable in bed, pillow and warm blanket given to patients. No further needs noted at this time, pt given call bell and instructed to press it if he has further needs.

## 2020-09-23 NOTE — ED Provider Notes (Signed)
[facility] Emergency Department Provider Note  ____________________________________________   First MD Initiated Contact with Patient 09/23/20 [phone]     (approximate)  I have reviewed the triage vital signs and the nursing notes.   HISTORY  Chief Complaint Weakness    HPI [name] is a 60 y.o. male with below list of previous medical conditions including hyponatremia seizures migraine kidney disease presents to the emergency department secondary to acute onset of bilateral upper and lower extremity weakness which patient states began yesterday and progressively worsened over the course of the day.  Patient denies any recent illness.  Patient denies any headache no nausea or vomiting.  Patient denies any diarrhea or constipation.  Patient denies any visual changes.        Past Medical History:  Diagnosis Date  . Anemia   . GERD (gastroesophageal reflux disease)   . Hypertension   . Hyponatremia    chronic  . Kidney disease   . Migraine   . Seizures [facility])     Patient Active Problem List   Diagnosis Date Noted  . Diarrhea   . Weakness   . Ataxia 06/19/2020  . Iron deficiency anemia, unspecified   . Benign neoplasm of ascending colon   . Heartburn   . Gastritis   . Altered mental state   . Acute delirium 10/31/2015  . Fall   . Dizziness   . Laceration of scalp   . Near syncope 10/27/2015  . Poverty 10/27/2015  . Symptomatic anemia   . Early satiety   . FTT (failure to thrive) in adult   . Protein-calorie malnutrition, severe ([place]) 09/07/2015  . Hyponatremia 09/05/2015  . Elevated troponin 09/05/2015  . Hypokalemia 09/05/2015  . Leukopenia 09/05/2015  . Renal insufficiency 09/05/2015  . Tremor, essential 07/15/2015  . Essential hypertension 07/15/2015  . Chronic renal insufficiency 07/15/2015  . Leg swelling 07/15/2015  . Partial epilepsy with impairment of consciousness ([place]) 05/19/2015  . Chronic kidney disease (CKD),  stage III (moderate) ([place]) 05/19/2015  . Absolute anemia 05/19/2015  . Chest pain 04/17/2015    Past Surgical History:  Procedure Laterality Date  . COLONOSCOPY WITH PROPOFOL N/A 05/01/2016   Procedure: COLONOSCOPY WITH PROPOFOL;  Surgeon: Lucilla Lame, MD;  Location: [facility] ENDOSCOPY;  Service: Endoscopy;  Laterality: N/A;  . ESOPHAGOGASTRODUODENOSCOPY (EGD) WITH PROPOFOL N/A 05/01/2016   Procedure: ESOPHAGOGASTRODUODENOSCOPY (EGD) WITH PROPOFOL;  Surgeon: Lucilla Lame, MD;  Location: [facility] ENDOSCOPY;  Service: Endoscopy;  Laterality: N/A;  . none      Prior to Admission medications   Medication Sig Start Date End Date Taking? Authorizing Provider  alendronate (FOSAMAX) 70 MG tablet Take 70 mg by mouth once a week. Take with a full glass of water on an empty stomach.    [provider]  atorvastatin (LIPITOR) 80 MG tablet Take 80 mg by mouth daily. 08/23/20   [provider]  carbamazepine (TEGRETOL) 200 MG tablet Take 200-400 mg by mouth See admin instructions. Take 2 tablets (400) mg by mouth every morning, Take 1 tablet by mouth at noon, and Take 2 tablets (400 mg) by mouth every night at bedtime.    [provider]  divalproex (DEPAKOTE) 250 MG DR tablet Take 3 tablets (750 mg total) by mouth every 12 (twelve) hours. Patient taking differently: Take 750 mg by mouth 2 (two) times daily.  11/18/15   Vaughan Basta, MD  feeding supplement, ENSURE ENLIVE, (ENSURE ENLIVE) LIQD Take 237 mLs by mouth 3 (three)  times daily between meals. 09/15/16   Hower, Aaron Mose, MD  lisinopril (PRINIVIL,ZESTRIL) 5 MG tablet Take 5 mg by mouth at bedtime.    [provider]  propranolol (INDERAL) 20 MG tablet Take 20 mg by mouth 2 (two) times daily.    [provider]  sodium chloride 1 g tablet Take 1 tablet (1 g total) by mouth 3 (three) times daily with meals. 08/12/19   Dustin Flock, MD  tamsulosin (FLOMAX) 0.4 MG CAPS capsule Take 1 capsule (0.4 mg total) by  mouth daily. Patient taking differently: Take 0.4 mg by mouth at bedtime.  09/16/16   Hower, Aaron Mose, MD    Allergies Nsaids  Family History  Problem Relation Age of Onset  . Heart attack Father   . Kidney disease Father     Social History Social History   Tobacco Use  . Smoking status: Never Smoker  . Smokeless tobacco: Never Used  Vaping Use  . Vaping Use: Never used  Substance Use Topics  . Alcohol use: No  . Drug use: No    Review of Systems Constitutional: No fever/chills Eyes: No visual changes. ENT: No sore throat. Cardiovascular: Denies chest pain. Respiratory: Denies shortness of breath. Gastrointestinal: No abdominal pain.  No nausea, no vomiting.  No diarrhea.  No constipation. Genitourinary: Negative for dysuria. Musculoskeletal: Negative for neck pain.  Negative for back pain. Integumentary: Negative for rash. Neurological: Negative for headaches.  Positive for generalized weakness   ____________________________________________   PHYSICAL EXAM:  VITAL SIGNS: ED Triage Vitals [09/22/20 2343]  Enc Vitals Group     BP (!) 143/80     Pulse Rate 83     Resp 14     Temp 98.2 F (36.8 C)     Temp Source Oral     SpO2 100 %     Weight 54.4 kg (120 lb)     Height 1.6 m (5\' 3" )     Head Circumference      Peak Flow      Pain Score 0     Pain Loc      Pain Edu?      Excl. in [place]?     Constitutional: Alert and oriented.  Eyes: Conjunctivae are normal.  Head: Atraumatic. Mouth/Throat: Patient is wearing a mask. Neck: No stridor.  No meningeal signs.   Cardiovascular: Normal rate, regular rhythm. Good peripheral circulation. Grossly normal heart sounds. Respiratory: Normal respiratory effort.  No retractions. Gastrointestinal: Soft and nontender. No distention.  Musculoskeletal: No lower extremity tenderness nor edema. No gross deformities of extremities. Neurologic:  Normal speech and language. No gross focal neurologic deficits are appreciated.   Baseline  tremor Skin:  Skin is warm, dry and intact. Psychiatric: Mood and affect are normal. Speech and behavior are normal.  ____________________________________________   LABS (all labs ordered are listed, but only abnormal results are displayed)  Labs Reviewed  CBC WITH DIFFERENTIAL/PLATELET - Abnormal; Notable for the following components:      Result Value   RBC 3.43 (*)    Hemoglobin 11.5 (*)    HCT 32.5 (*)    Platelets 146 (*)    All other components within normal limits  COMPREHENSIVE METABOLIC PANEL - Abnormal; Notable for the following components:   Sodium 122 (*)    Chloride 88 (*)    Glucose, Bld 105 (*)    Alkaline Phosphatase 30 (*)    All other components within normal limits  TROPONIN I (HIGH SENSITIVITY)  Procedures   ____________________________________________   INITIAL IMPRESSION / MDM / ASSESSMENT AND PLAN / ED COURSE  As part of my medical decision making, I reviewed the following data within the electronic MEDICAL RECORD NUMBER   59 year old male presented with above-stated history and physical exam concerning for hyponatremia which was confirmed on laboratory data.  Patient's sodium noted to be 122.  Patient given 2 L IV normal saline in the emergency department.  Patient admitted to the hospital staff for further evaluation and management. ____________________________________________  FINAL CLINICAL IMPRESSION(S) / ED DIAGNOSES  Final diagnoses:  Hyponatremia     MEDICATIONS GIVEN DURING THIS VISIT:  Medications  sodium chloride 0.9 % bolus 1,000 mL (has no administration in time range)  sodium chloride 0.9 % bolus 1,000 mL (has no administration in time range)  carbamazepine (TEGRETOL) tablet 400 mg (400 mg Oral Given 09/22/20 2359)  divalproex (DEPAKOTE) DR tablet 750 mg (750 mg Oral Given 09/22/20 2358)  sodium chloride tablet 1 g (1 g Oral Given 09/23/20 0020)     ED Discharge Orders    None      *Please note:  [name]  was evaluated in Emergency Department on 09/23/2020 for the symptoms described in the history of present illness. He was evaluated in the context of the global COVID-19 pandemic, which necessitated consideration that the patient might be at risk for infection with the SARS-CoV-2 virus that causes COVID-19. Institutional protocols and algorithms that pertain to the evaluation of patients at risk for COVID-19 are in a state of rapid change based on information released by regulatory bodies including the CDC and federal and state organizations. These policies and algorithms were followed during the patient's care in the ED.  Some ED evaluations and interventions may be delayed as a result of limited staffing during and after the pandemic.*  Note:  This document was prepared using Dragon voice recognition software and may include unintentional dictation errors.   Gregor Hams, MD 09/23/20 [phone]

## 2020-09-23 NOTE — ED Notes (Signed)
Pt states that he's been feeling very weak today. Pt states he's shaking when he's trying to open pill bottles and that he has very little strength in his extremities. Hx of low sodium.

## 2020-09-23 NOTE — H&P (Addendum)
History and Physical    [name] BJS:[id number] DOB: [birth year] DOA: 09/23/2020  PCP: [name], MD   Patient coming from: Home  I have personally briefly reviewed patient's old medical records in [place]  Chief Complaint: Generalized weakness  HPI: [name] is a 59 y.o. male with medical history significant for HTN, seizure disorder, chronic anemia, GERD CKD 2 and chronic hyponatremia and hypoosmolality followed by nephrology and on salt tablets, recently hospitalized from 10/2-10/4 with weakness and elevated troponin ruled out for ACS, who presents to the emergency room with generalized weakness mostly in his upper extremities where he complained of feeling too weak to open his pill bottles.  He denies any other ailments.  Denies headache, blurred vision, one-sided numbness or tingling.  Denies chest pain or shortness of breath, fever or chills, nausea or vomiting.  Denies abdominal pain or change in bowel habits or dysuria.  He admits to compliance with his medication.  He was brought in by EMS. ED Course: Upon arrival vitals were unremarkable.  Blood work significant for sodium of 122, down from 120 at his recently discharged on 10/4.  Other blood work including hemoglobin of 11.5 mostly at baseline EKG as reviewed by me : Normal sinus rhythm rate of 61 with no acute ST-T wave changes Patient given 2 L saline boluses in the ER.  Hospitalist consulted for admission. Review of Systems: As per HPI otherwise all other systems on review of systems negative.    Past Medical History:  Diagnosis Date  . Anemia   . GERD (gastroesophageal reflux disease)   . Hypertension   . Hyponatremia    chronic  . Kidney disease   . Migraine   . Seizures ([place])     Past Surgical History:  Procedure Laterality Date  . COLONOSCOPY WITH PROPOFOL N/A 05/01/2016   Procedure: COLONOSCOPY WITH PROPOFOL;  Surgeon: Lucilla Lame, MD;  Location: [facility] ENDOSCOPY;  Service: Endoscopy;   Laterality: N/A;  . ESOPHAGOGASTRODUODENOSCOPY (EGD) WITH PROPOFOL N/A 05/01/2016   Procedure: ESOPHAGOGASTRODUODENOSCOPY (EGD) WITH PROPOFOL;  Surgeon: Lucilla Lame, MD;  Location: [facility] ENDOSCOPY;  Service: Endoscopy;  Laterality: N/A;  . none       reports that he has never smoked. He has never used smokeless tobacco. He reports that he does not drink alcohol and does not use drugs.  Allergies  Allergen Reactions  . Nsaids Other (See Comments)    Kidney disease    Family History  Problem Relation Age of Onset  . Heart attack Father   . Kidney disease Father       Prior to Admission medications   Medication Sig Start Date End Date Taking? Authorizing Provider  alendronate (FOSAMAX) 70 MG tablet Take 70 mg by mouth once a week. Take with a full glass of water on an empty stomach.    [provider]  atorvastatin (LIPITOR) 80 MG tablet Take 80 mg by mouth daily. 08/23/20   [provider]  carbamazepine (TEGRETOL) 200 MG tablet Take 200-400 mg by mouth See admin instructions. Take 2 tablets (400) mg by mouth every morning, Take 1 tablet by mouth at noon, and Take 2 tablets (400 mg) by mouth every night at bedtime.    [provider]  divalproex (DEPAKOTE) 250 MG DR tablet Take 3 tablets (750 mg total) by mouth every 12 (twelve) hours. Patient taking differently: Take 750 mg by mouth 2 (two) times daily.  11/18/15   Vaughan Basta, MD  feeding supplement,  ENSURE ENLIVE, (ENSURE ENLIVE) LIQD Take 237 mLs by mouth 3 (three) times daily between meals. 09/15/16   Hower, Aaron Mose, MD  lisinopril (PRINIVIL,ZESTRIL) 5 MG tablet Take 5 mg by mouth at bedtime.    [provider]  propranolol (INDERAL) 20 MG tablet Take 20 mg by mouth 2 (two) times daily.    [provider]  sodium chloride 1 g tablet Take 1 tablet (1 g total) by mouth 3 (three) times daily with meals. 08/12/19   Dustin Flock, MD  tamsulosin (FLOMAX) 0.4 MG CAPS capsule Take 1  capsule (0.4 mg total) by mouth daily. Patient taking differently: Take 0.4 mg by mouth at bedtime.  09/16/16   Hower, Aaron Mose, MD    Physical Exam: Vitals:   09/22/20 2343 09/23/20 0232  BP: (!) 143/80 (!) 166/101  Pulse: 83 83  Resp: 14 18  Temp: 98.2 F (36.8 C)   TempSrc: Oral   SpO2: 100% 100%  Weight: 54.4 kg   Height: 5\' 3"  (1.6 m)      Vitals:   09/22/20 2343 09/23/20 0232  BP: (!) 143/80 (!) 166/101  Pulse: 83 83  Resp: 14 18  Temp: 98.2 F (36.8 C)   TempSrc: Oral   SpO2: 100% 100%  Weight: 54.4 kg   Height: 5\' 3"  (1.6 m)       Constitutional: Alert and oriented x 3.  Disheveled appearance. Not in any apparent distress HEENT:      Head: Normocephalic and atraumatic.         Eyes: PERLA, EOMI, Conjunctivae are normal. Sclera is non-icteric.       Mouth/Throat: Mucous membranes are moist.       Neck: Supple with no signs of meningismus. Cardiovascular: Regular rate and rhythm. No murmurs, gallops, or rubs. 2+ symmetrical distal pulses are present . No JVD. No LE edema Respiratory: Respiratory effort normal .Lungs sounds clear bilaterally. No wheezes, crackles, or rhonchi.  Gastrointestinal: Soft, non tender, and non distended with positive bowel sounds. No rebound or guarding. Genitourinary: No CVA tenderness. Musculoskeletal: Nontender with normal range of motion in all extremities. No cyanosis, or erythema of extremities. Neurologic:  Face is symmetric. Moving all extremities. No gross focal neurologic deficits . Skin: Skin is warm, dry.  No rash or ulcers Psychiatric: Mood and affect are normal    Labs on Admission: I have personally reviewed following labs and imaging studies  CBC: Recent Labs  Lab 09/16/20 1123 09/17/20 0521 09/18/20 0412 09/23/20 0230  WBC 4.5 6.6 4.0 4.6  NEUTROABS  --   --   --  3.3  HGB 13.4 11.6* 10.5* 11.5*  HCT 36.2* 33.2* 30.3* 32.5*  MCV 92.8 96.8 96.5 94.8  PLT 172 141* 132* 315*   Basic Metabolic Panel: Recent  Labs  Lab 09/16/20 1123 09/17/20 0521 09/23/20 0230  NA 128* 130* 122*  K 4.7 4.5 5.0  CL 92* 99 88*  CO2 25 22 24   GLUCOSE 140* 95 105*  BUN 23 21 22   CREATININE 1.31* 1.04 1.03  CALCIUM 9.4 8.2* 9.1   GFR: Estimated Creatinine Clearance: 58 mL/min (by C-G formula based on SCr of 1.03 mg/dL). Liver Function Tests: Recent Labs  Lab 09/23/20 0230  AST 29  ALT 20  ALKPHOS 30*  BILITOT 0.9  PROT 6.8  ALBUMIN 3.6   No results for input(s): LIPASE, AMYLASE in the last 168 hours. No results for input(s): AMMONIA in the last 168 hours. Coagulation Profile: Recent Labs  Lab  09/17/20 0244  INR 0.9   Cardiac Enzymes: No results for input(s): CKTOTAL, CKMB, CKMBINDEX, TROPONINI in the last 168 hours. BNP (last 3 results) No results for input(s): PROBNP in the last 8760 hours. HbA1C: No results for input(s): HGBA1C in the last 72 hours. CBG: Recent Labs  Lab 09/16/20 2212  GLUCAP 118*   Lipid Profile: No results for input(s): CHOL, HDL, LDLCALC, TRIG, CHOLHDL, LDLDIRECT in the last 72 hours. Thyroid Function Tests: No results for input(s): TSH, T4TOTAL, FREET4, T3FREE, THYROIDAB in the last 72 hours. Anemia Panel: No results for input(s): VITAMINB12, FOLATE, FERRITIN, TIBC, IRON, RETICCTPCT in the last 72 hours. Urine analysis:    Component Value Date/Time   COLORURINE AMBER (A) 09/16/2020 2223   APPEARANCEUR CLEAR (A) 09/16/2020 2223   APPEARANCEUR Clear 07/26/2014 1925   LABSPEC 1.025 09/16/2020 2223   LABSPEC 1.006 07/26/2014 1925   PHURINE 5.0 09/16/2020 2223   GLUCOSEU NEGATIVE 09/16/2020 2223   GLUCOSEU Negative 07/26/2014 1925   HGBUR NEGATIVE 09/16/2020 2223   BILIRUBINUR SMALL (A) 09/16/2020 2223   BILIRUBINUR Negative 07/26/2014 1925   KETONESUR 5 (A) 09/16/2020 2223   PROTEINUR NEGATIVE 09/16/2020 2223   NITRITE NEGATIVE 09/16/2020 2223   LEUKOCYTESUR NEGATIVE 09/16/2020 2223   LEUKOCYTESUR Negative 07/26/2014 1925    Radiological Exams on  Admission: No results found.   Assessment/Plan 59 year old male with history of HTN, seizure disorder, chronic anemia, GERD, CKD 2 and chronic hyponatremia and hypoosmolality followed by nephrology and on salt tablets, recently hospitalized from 10/2-10/4 with weakness and elevated troponin ruled out for ACS, presenting with generalized weakness.  Acute on chronic hyponatremia, with history of hypoosmolality -Patient is chronically hyponatremic and on salt tablets -Sodium 122, down from 130 on 10/4 -Serum and urine osmolality and urine sodium -Received 2 L normal saline fluid boluses in the ER -We will continue hydration with NS pending results and osmolality -Nephrology consult    Generalized weakness -Believed secondary to hyponatremia above -   Essential hypertension -Continue propranolol    CKD (chronic kidney disease) stage 2, GFR 60-89 ml/min -Renal function WNL    Chronic anemia -Hemoglobin 11.6 which is baseline    Seizure disorder (HCC) -Continue Depakote and Tegretol    DVT prophylaxis: Lovenox  Code Status: full code  Family Communication:  none  Disposition Plan: Back to previous home environment Consults called: Renal Status: Observation    Athena Masse MD [facility]     09/23/2020, 4:29 AM

## 2020-09-24 DIAGNOSIS — I10 Essential (primary) hypertension: Secondary | ICD-10-CM | POA: Diagnosis not present

## 2020-09-24 DIAGNOSIS — E871 Hypo-osmolality and hyponatremia: Secondary | ICD-10-CM | POA: Diagnosis not present

## 2020-09-24 LAB — BASIC METABOLIC PANEL
Anion gap: 3 — ABNORMAL LOW (ref 5–15)
BUN: 11 mg/dL (ref 8–23)
CO2: 24 mmol/L (ref 22–32)
Calcium: 7.4 mg/dL — ABNORMAL LOW (ref 8.9–10.3)
Chloride: 97 mmol/L — ABNORMAL LOW (ref 98–111)
Creatinine, Ser: 0.9 mg/dL (ref 0.61–1.24)
GFR, Estimated: >60 mL/min (ref >60)
Glucose, Bld: 96 mg/dL (ref 70–99)
Potassium: 3.7 mmol/L (ref 3.5–5.1)
Sodium: 124 mmol/L — ABNORMAL LOW (ref 135–145)

## 2020-09-24 LAB — CBC
HCT: 24.7 % — ABNORMAL LOW (ref 39.0–52.0)
Hemoglobin: 8.8 g/dL — ABNORMAL LOW (ref 13.0–17.0)
MCH: 33.5 pg (ref 26.0–34.0)
MCHC: 35.6 g/dL (ref 30.0–36.0)
MCV: 93.9 fL (ref 80.0–100.0)
Platelets: 134 10*3/uL — ABNORMAL LOW (ref 150–400)
RBC: 2.63 MIL/uL — ABNORMAL LOW (ref 4.22–5.81)
RDW: 13.1 % (ref 11.5–15.5)
WBC: 3.8 10*3/uL — ABNORMAL LOW (ref 4.0–10.5)
nRBC: 0 % (ref 0.0–0.2)

## 2020-09-24 NOTE — Evaluation (Signed)
Occupational Therapy Evaluation Patient Details Name: [name] MRN: [id number] DOB: 03-21-1961 Today's Date: 09/24/2020    History of Present Illness Pt. is a 59 year old male who was [facility] to [facility] with Bilateral UE,a nd LE weakness secondary to Hyponatremia. PMH includes: migraines, seuzires, GERD, and HTN.   Clinical Impression   Pt. presents with BUE weakness, limited activity tolerance, and limited functional mobility which hinders his ability to complete basic ADL and IADL functioning. Pt. had a recent hospital admission one week prior to this hospital admission secondary to bilateral UE, and LE weakness from hyponatremia. Pt. reports yesterday he was unable to use his UEs during ADL tasks. Pt. Is now able to engage his UEs during meals, however requires assist and presents with increased spillage. Pt. Could benefit from OT services for ADL training, A/E training, and pt. Education about home modification, and DME. Pt. would benefit from SNF level of care upon discharge, with follow-up OT services.    Follow Up Recommendations  SNF    Equipment Recommendations  3 in 1 bedside commode;Tub/shower seat    Recommendations for Other Services       Precautions / Restrictions Precautions Precautions: Fall Restrictions Weight Bearing Restrictions: No      Mobility Bed Mobility               General bed mobility comments: Pt. seen at bed level  Transfers                 General transfer comment: deferred    Balance                                           ADL either performed or assessed with clinical judgement   ADL Overall ADL's : Needs assistance/impaired Eating/Feeding: Minimal assistance   Grooming: Minimal assistance           Upper Body Dressing : Minimal assistance   Lower Body Dressing: Moderate assistance                       Vision Baseline Vision/History: Wears glasses Wears Glasses: At all  times Patient Visual Report: No change from baseline       Perception     Praxis      Pertinent Vitals/Pain Pain Assessment: No/denies pain     Hand Dominance     Extremity/Trunk Assessment Upper Extremity Assessment Upper Extremity Assessment: Generalized weakness           Communication Communication Communication: No difficulties   Cognition Arousal/Alertness: Awake/alert Behavior During Therapy: WFL for tasks assessed/performed Overall Cognitive Status: Within Functional Limits for tasks assessed                                     General Comments       Exercises     Shoulder Instructions      Home Living Family/patient expects to be discharged to:: Private residence Living Arrangements: Alone Available Help at Discharge: Friend(s);Available PRN/intermittently   Home Access: Stairs to enter Entrance Stairs-Number of Steps: 3 Entrance Stairs-Rails: Right       Bathroom Shower/Tub: Tub/shower unit         Home Equipment: Walker - 2 wheels          Prior Functioning/Environment  Level of Independence: Independent with assistive device(s)                 OT Problem List: Decreased strength;Decreased activity tolerance;Impaired balance (sitting and/or standing);Decreased coordination;Decreased knowledge of use of DME or AE;Cardiopulmonary status limiting activity      OT Treatment/Interventions: Self-care/ADL training;DME and/or AE instruction;Therapeutic activities;Balance training;Therapeutic exercise;Patient/family education    OT Goals(Current goals can be found in the care plan section) Acute Rehab OT Goals Patient Stated Goal: To regain strength OT Goal Formulation: With patient Potential to Achieve Goals: Good  OT Frequency: Min 1X/week   Barriers to D/C:            Co-evaluation              AM-PAC OT "6 Clicks" Daily Activity     Outcome Measure Help from another person eating meals?: A Little Help  from another person taking care of personal grooming?: A Little Help from another person toileting, which includes using toliet, bedpan, or urinal?: A Little Help from another person bathing (including washing, rinsing, drying)?: A Little Help from another person to put on and taking off regular upper body clothing?: A Little Help from another person to put on and taking off regular lower body clothing?: A Lot 6 Click Score: 17   End of Session Nurse Communication: Mobility status  Activity Tolerance: Patient tolerated treatment well Patient left: in chair;with call bell/phone within reach;with chair alarm set  OT Visit Diagnosis: Unsteadiness on feet (R26.81);Muscle weakness (generalized) (M62.81)                Time: 0881-1031 OT Time Calculation (min): 22 min Charges:  OT General Charges $OT Visit: 1 Visit OT Evaluation $OT Eval Low Complexity: 1 Low  Harrel Carina, MS, OTR/L  Harrel Carina 09/24/2020, 12:57 PM

## 2020-09-24 NOTE — Progress Notes (Signed)
[facility]  ROUNDING NOTE   Subjective:   Patient states he is feeling better and eating better.   Na 124 (121) (122)  NS at 193mL/hr NaCl tabs.   Objective:  Vital signs in last 24 hours:  Temp:  [97.9 F (36.6 C)-98.9 F (37.2 C)] 98.2 F (36.8 C) (10/09 1152) Pulse Rate:  [56-77] 60 (10/09 1152) Resp:  [16-20] 19 (10/09 1152) BP: (83-122)/(51-71) 86/60 (10/09 1152) SpO2:  [91 %-100 %] 100 % (10/09 1152) Weight:  [54.7 kg] 54.7 kg (10/09 0416)  Weight change: 0.318 kg Filed Weights   09/22/20 2343 09/24/20 0416  Weight: 54.4 kg 54.7 kg    Intake/Output: I/O last 3 completed shifts: In: 76 [P.O.:720] Out: 1875 [Urine:1875]   Intake/Output this shift:  Total I/O In: 600 [P.O.:600] Out: 200 [Urine:200]  Physical Exam: General:  NAD  Head: Normocephalic, atraumatic. Moist oral mucosal membranes  Eyes: Anicteric  Neck: Supple  Lungs:  Clear to auscultation bilaterally, normal and symmetric respiratory effort  Heart:  regular  Abdomen:  Soft, nontender, nondistended  Extremities:  No peripheral edema.  Neurologic:  Alert, oriented  Skin: No acute lesions or rashes    Basic Metabolic Panel: Recent Labs  Lab 09/23/20 0230 09/23/20 0557 09/24/20 0345  NA 122* 121* 124*  K 5.0  --  3.7  CL 88*  --  97*  CO2 24  --  24  GLUCOSE 105*  --  96  BUN 22  --  11  CREATININE 1.03  --  0.90  CALCIUM 9.1  --  7.4*    Liver Function Tests: Recent Labs  Lab 09/23/20 0230  AST 29  ALT 20  ALKPHOS 30*  BILITOT 0.9  PROT 6.8  ALBUMIN 3.6   No results for input(s): LIPASE, AMYLASE in the last 168 hours. No results for input(s): AMMONIA in the last 168 hours.  CBC: Recent Labs  Lab 09/18/20 0412 09/23/20 0230 09/24/20 0345  WBC 4.0 4.6 3.8*  NEUTROABS  --  3.3  --   HGB 10.5* 11.5* 8.8*  HCT 30.3* 32.5* 24.7*  MCV 96.5 94.8 93.9  PLT 132* 146* 134*    Cardiac Enzymes: No results for input(s): CKTOTAL, CKMB, CKMBINDEX, TROPONINI  in the last 168 hours.  BNP: Invalid input(s): POCBNP  CBG: No results for input(s): GLUCAP in the last 168 hours.  Microbiology: Results for orders placed or performed during the hospital encounter of 09/23/20  Respiratory Panel by RT PCR (Flu A&B, Covid) - Nasopharyngeal Swab     Status: None   Collection Time: 09/23/20  5:57 AM   Specimen: Nasopharyngeal Swab  Result Value Ref Range Status   SARS Coronavirus 2 by RT PCR NEGATIVE NEGATIVE Final    Comment: (NOTE) SARS-CoV-2 target nucleic acids are NOT DETECTED.  The SARS-CoV-2 RNA is generally detectable in upper respiratoy specimens during the acute phase of infection. The lowest concentration of SARS-CoV-2 viral copies this assay can detect is 131 copies/mL. A negative result does not preclude SARS-Cov-2 infection and should not be used as the sole basis for treatment or other patient management decisions. A negative result may occur with  improper specimen collection/handling, submission of specimen other than nasopharyngeal swab, presence of viral mutation(s) within the areas targeted by this assay, and inadequate number of viral copies (<131 copies/mL). A negative result must be combined with clinical observations, patient history, and epidemiological information. The expected result is Negative.  Fact Sheet for Patients:  PinkCheek.be  Fact Sheet  for Healthcare Providers:  GravelBags.it  This test is no t yet approved or cleared by the [place] and  has been authorized for detection and/or diagnosis of SARS-CoV-2 by FDA under an Emergency Use Authorization (EUA). This EUA will remain  in effect (meaning this test can be used) for the duration of the COVID-19 declaration under Section 564(b)(1) of the Act, 21 U.S.C. section 360bbb-3(b)(1), unless the authorization is terminated or revoked sooner.     Influenza A by PCR NEGATIVE NEGATIVE Final    Influenza B by PCR NEGATIVE NEGATIVE Final    Comment: (NOTE) The Xpert Xpress SARS-CoV-2/FLU/RSV assay is intended as an aid in  the diagnosis of influenza from Nasopharyngeal swab specimens and  should not be used as a sole basis for treatment. Nasal washings and  aspirates are unacceptable for Xpert Xpress SARS-CoV-2/FLU/RSV  testing.  Fact Sheet for Patients: PinkCheek.be  Fact Sheet for Healthcare Providers: GravelBags.it  This test is not yet approved or cleared by the [place] FDA and  has been authorized for detection and/or diagnosis of SARS-CoV-2 by  FDA under an Emergency Use Authorization (EUA). This EUA will remain  in effect (meaning this test can be used) for the duration of the  Covid-19 declaration under Section 564(b)(1) of the Act, 21  U.S.C. section 360bbb-3(b)(1), unless the authorization is  terminated or revoked. Performed at [facility], [address] [place], [place] [address]     Coagulation Studies: No results for input(s): LABPROT, INR in the last 72 hours.  Urinalysis: No results for input(s): COLORURINE, LABSPEC, PHURINE, GLUCOSEU, HGBUR, BILIRUBINUR, KETONESUR, PROTEINUR, UROBILINOGEN, NITRITE, LEUKOCYTESUR in the last 72 hours.  Invalid input(s): APPERANCEUR    Imaging: CT CHEST W CONTRAST  Result Date: 09/23/2020 CLINICAL DATA:  59 year old male with concern for lung mass. EXAM: CT CHEST WITH CONTRAST TECHNIQUE: Multidetector CT imaging of the chest was performed during intravenous contrast administration. CONTRAST:  65mL OMNIPAQUE IOHEXOL 300 MG/ML  SOLN COMPARISON:  Chest radiograph dated 09/17/2020 and CT dated 10/27/2015. FINDINGS: Cardiovascular: There is no cardiomegaly or pericardial effusion. Coronary vascular calcification involving the LAD. The thoracic aorta is unremarkable. The origins of the great vessels of the aortic arch appear patent. No pulmonary artery  embolus identified. Mediastinum/Nodes: There is no hilar or mediastinal adenopathy. The esophagus and the thyroid gland are grossly unremarkable. No mediastinal fluid collection. Lungs/Pleura: There are trace bilateral pleural effusions with bibasilar subsegmental compressive atelectasis. No focal consolidation or pneumothorax. The central airways are patent. Upper Abdomen: No acute abnormality. Musculoskeletal: Degenerative changes of the spine. No acute osseous pathology. IMPRESSION: 1. No acute intrathoracic pathology.  No mass or adenopathy. 2. No CT evidence of pulmonary artery embolus. 3. Trace bilateral pleural effusions with bibasilar subsegmental compressive atelectasis. Electronically Signed   By: Anner Crete M.D.   On: 09/23/2020 17:18     Medications:   . sodium chloride 100 mL/hr at 09/24/20 0205   . atorvastatin  80 mg Oral Daily  . carbamazepine  400 mg Oral BID  . divalproex  750 mg Oral Q12H  . enoxaparin (LOVENOX) injection  40 mg Subcutaneous Q24H  . lisinopril  5 mg Oral QHS  . propranolol  20 mg Oral BID  . sodium chloride  1 g Oral Daily  . tamsulosin  0.4 mg Oral Daily   acetaminophen **OR** acetaminophen, ondansetron **OR** ondansetron (ZOFRAN) IV  Assessment/ Plan:  [name] is a 59 y.o. white male with seizures, hypertension, anemia, GERD  who was admitted to [facility] on 09/23/2020 for Hyponatremia [E87.1]  1. Hyponatremia: hypovolemic on examination - IV and PO replacement - serial sodium checks.  2. Hypertension: currently with hypotension. Hold lisinopril and propanolol.    LOS: 1 Niasia Lanphear 10/9/20211:54 PM

## 2020-09-24 NOTE — Progress Notes (Signed)
PROGRESS NOTE    [name]  WRU:[id number] DOB: 1961-04-13 DOA: 09/23/2020 PCP: [name], MD  Assessment & Plan:   Principal Problem:   Chronic hyponatremia Active Problems:   Essential hypertension   Generalized weakness   CKD (chronic kidney disease) stage 2, GFR 60-89 ml/min   Acute hyponatremia   Chronic anemia   Seizure disorder (HCC)   Acute on chronic hyponatremia: w/ hx of hyperosmolality. Improving w/ NA 124 currently. Will continue on NaCl tabs. Nephro following and recs apprec   Generalized weakness: likely secondary to hyponatremia. PT/OT consulted   HTN: continue on home dose of BB, ACE-I   BPH: will continue on home dose of tamsulosin   HLD: continue on statin   CKDII: Cr is trending down from day prior  Normocytic anemia: Will transfuse if Hb <7. Will continue to monitor   Thrombocytopenia: etiology unclear. Will continue to monitor   Seizure disorder: will continue on home dose of depakote, carbamazepine   DVT prophylaxis: lovenox  Code Status:  Full  Family Communication:  Disposition Plan: depends on PT/OT recs  Consultants:   nephro    Procedures:    Antimicrobials:    Subjective: Pt c/o fatigue   Objective: Vitals:   09/23/20 1549 09/23/20 1934 09/24/20 0416 09/24/20 0613  BP: 122/64 115/71 (!) 88/51 (!) 92/54  Pulse: 62 77 (!) 59 63  Resp: 16 20 18 18   Temp: 98.5 F (36.9 C) 98.2 F (36.8 C) 97.9 F (36.6 C) 98.1 F (36.7 C)  TempSrc: Oral Oral Oral Oral  SpO2: 91% 100% 100% 100%  Weight:   54.7 kg   Height:        Intake/Output Summary (Last 24 hours) at 09/24/2020 0734 Last data filed at 09/24/2020 0420 Gross per 24 hour  Intake 720 ml  Output 1875 ml  Net -1155 ml   Filed Weights   09/22/20 2343 09/24/20 0416  Weight: 54.4 kg 54.7 kg    Examination:  General exam: Appears calm & comfortable. Disheveled. Poor dentition  Respiratory system: Clear breath sounds b/l. No wheezes, rales, rhonchi    Cardiovascular system: S1/S2+. No rubs or gallops  Gastrointestinal system: Abdomen is nondistended, soft and nontender. Normal bowel sounds heard. Central nervous system: Alert and oriented moves all 4 extremities  Psychiatry: Judgement and insight appear normal.Flat mood and affect    Data Reviewed: I have personally reviewed following labs and imaging studies  CBC: Recent Labs  Lab 09/18/20 0412 09/23/20 0230 09/24/20 0345  WBC 4.0 4.6 3.8*  NEUTROABS  --  3.3  --   HGB 10.5* 11.5* 8.8*  HCT 30.3* 32.5* 24.7*  MCV 96.5 94.8 93.9  PLT 132* 146* 914*   Basic Metabolic Panel: Recent Labs  Lab 09/23/20 0230 09/23/20 0557 09/24/20 0345  NA 122* 121* 124*  K 5.0  --  3.7  CL 88*  --  97*  CO2 24  --  24  GLUCOSE 105*  --  96  BUN 22  --  11  CREATININE 1.03  --  0.90  CALCIUM 9.1  --  7.4*   GFR: Estimated Creatinine Clearance: 66.7 mL/min (by C-G formula based on SCr of 0.9 mg/dL). Liver Function Tests: Recent Labs  Lab 09/23/20 0230  AST 29  ALT 20  ALKPHOS 30*  BILITOT 0.9  PROT 6.8  ALBUMIN 3.6   No results for input(s): LIPASE, AMYLASE in the last 168 hours. No results for input(s): AMMONIA in the last 168 hours. Coagulation  Profile: No results for input(s): INR, PROTIME in the last 168 hours. Cardiac Enzymes: No results for input(s): CKTOTAL, CKMB, CKMBINDEX, TROPONINI in the last 168 hours. BNP (last 3 results) No results for input(s): PROBNP in the last 8760 hours. HbA1C: No results for input(s): HGBA1C in the last 72 hours. CBG: No results for input(s): GLUCAP in the last 168 hours. Lipid Profile: No results for input(s): CHOL, HDL, LDLCALC, TRIG, CHOLHDL, LDLDIRECT in the last 72 hours. Thyroid Function Tests: No results for input(s): TSH, T4TOTAL, FREET4, T3FREE, THYROIDAB in the last 72 hours. Anemia Panel: No results for input(s): VITAMINB12, FOLATE, FERRITIN, TIBC, IRON, RETICCTPCT in the last 72 hours. Sepsis Labs: No results for  input(s): PROCALCITON, LATICACIDVEN in the last 168 hours.  Recent Results (from the past 240 hour(s))  Respiratory Panel by RT PCR (Flu A&B, Covid) - Nasopharyngeal Swab     Status: None   Collection Time: 09/17/20  2:29 AM   Specimen: Nasopharyngeal Swab  Result Value Ref Range Status   SARS Coronavirus 2 by RT PCR NEGATIVE NEGATIVE Final    Comment: (NOTE) SARS-CoV-2 target nucleic acids are NOT DETECTED.  The SARS-CoV-2 RNA is generally detectable in upper respiratoy specimens during the acute phase of infection. The lowest concentration of SARS-CoV-2 viral copies this assay can detect is 131 copies/mL. A negative result does not preclude SARS-Cov-2 infection and should not be used as the sole basis for treatment or other patient management decisions. A negative result may occur with  improper specimen collection/handling, submission of specimen other than nasopharyngeal swab, presence of viral mutation(s) within the areas targeted by this assay, and inadequate number of viral copies (<131 copies/mL). A negative result must be combined with clinical observations, patient history, and epidemiological information. The expected result is Negative.  Fact Sheet for Patients:  PinkCheek.be  Fact Sheet for Healthcare Providers:  GravelBags.it  This test is no t yet approved or cleared by the [place] FDA and  has been authorized for detection and/or diagnosis of SARS-CoV-2 by FDA under an Emergency Use Authorization (EUA). This EUA will remain  in effect (meaning this test can be used) for the duration of the COVID-19 declaration under Section 564(b)(1) of the Act, 21 U.S.C. section 360bbb-3(b)(1), unless the authorization is terminated or revoked sooner.     Influenza A by PCR NEGATIVE NEGATIVE Final   Influenza B by PCR NEGATIVE NEGATIVE Final    Comment: (NOTE) The Xpert Xpress SARS-CoV-2/FLU/RSV assay is intended  as an aid in  the diagnosis of influenza from Nasopharyngeal swab specimens and  should not be used as a sole basis for treatment. Nasal washings and  aspirates are unacceptable for Xpert Xpress SARS-CoV-2/FLU/RSV  testing.  Fact Sheet for Patients: PinkCheek.be  Fact Sheet for Healthcare Providers: GravelBags.it  This test is not yet approved or cleared by the [place] FDA and  has been authorized for detection and/or diagnosis of SARS-CoV-2 by  FDA under an Emergency Use Authorization (EUA). This EUA will remain  in effect (meaning this test can be used) for the duration of the  Covid-19 declaration under Section 564(b)(1) of the Act, 21  U.S.C. section 360bbb-3(b)(1), unless the authorization is  terminated or revoked. Performed at [facility], [address] [place], [place] [address]   Respiratory Panel by RT PCR (Flu A&B, Covid) - Nasopharyngeal Swab     Status: None   Collection Time: 09/23/20  5:57 AM   Specimen: Nasopharyngeal Swab  Result Value Ref Range  Status   SARS Coronavirus 2 by RT PCR NEGATIVE NEGATIVE Final    Comment: (NOTE) SARS-CoV-2 target nucleic acids are NOT DETECTED.  The SARS-CoV-2 RNA is generally detectable in upper respiratoy specimens during the acute phase of infection. The lowest concentration of SARS-CoV-2 viral copies this assay can detect is 131 copies/mL. A negative result does not preclude SARS-Cov-2 infection and should not be used as the sole basis for treatment or other patient management decisions. A negative result may occur with  improper specimen collection/handling, submission of specimen other than nasopharyngeal swab, presence of viral mutation(s) within the areas targeted by this assay, and inadequate number of viral copies (<131 copies/mL). A negative result must be combined with clinical observations, patient history, and epidemiological information.  The expected result is Negative.  Fact Sheet for Patients:  PinkCheek.be  Fact Sheet for Healthcare Providers:  GravelBags.it  This test is no t yet approved or cleared by the [place] FDA and  has been authorized for detection and/or diagnosis of SARS-CoV-2 by FDA under an Emergency Use Authorization (EUA). This EUA will remain  in effect (meaning this test can be used) for the duration of the COVID-19 declaration under Section 564(b)(1) of the Act, 21 U.S.C. section 360bbb-3(b)(1), unless the authorization is terminated or revoked sooner.     Influenza A by PCR NEGATIVE NEGATIVE Final   Influenza B by PCR NEGATIVE NEGATIVE Final    Comment: (NOTE) The Xpert Xpress SARS-CoV-2/FLU/RSV assay is intended as an aid in  the diagnosis of influenza from Nasopharyngeal swab specimens and  should not be used as a sole basis for treatment. Nasal washings and  aspirates are unacceptable for Xpert Xpress SARS-CoV-2/FLU/RSV  testing.  Fact Sheet for Patients: PinkCheek.be  Fact Sheet for Healthcare Providers: GravelBags.it  This test is not yet approved or cleared by the [place] FDA and  has been authorized for detection and/or diagnosis of SARS-CoV-2 by  FDA under an Emergency Use Authorization (EUA). This EUA will remain  in effect (meaning this test can be used) for the duration of the  Covid-19 declaration under Section 564(b)(1) of the Act, 21  U.S.C. section 360bbb-3(b)(1), unless the authorization is  terminated or revoked. Performed at [facility], [address] [place], [place] [address]          Radiology Studies: CT CHEST W CONTRAST  Result Date: 09/23/2020 CLINICAL DATA:  59 year old male with concern for lung mass. EXAM: CT CHEST WITH CONTRAST TECHNIQUE: Multidetector CT imaging of the chest was performed during intravenous  contrast administration. CONTRAST:  3mL OMNIPAQUE IOHEXOL 300 MG/ML  SOLN COMPARISON:  Chest radiograph dated 09/17/2020 and CT dated 10/27/2015. FINDINGS: Cardiovascular: There is no cardiomegaly or pericardial effusion. Coronary vascular calcification involving the LAD. The thoracic aorta is unremarkable. The origins of the great vessels of the aortic arch appear patent. No pulmonary artery embolus identified. Mediastinum/Nodes: There is no hilar or mediastinal adenopathy. The esophagus and the thyroid gland are grossly unremarkable. No mediastinal fluid collection. Lungs/Pleura: There are trace bilateral pleural effusions with bibasilar subsegmental compressive atelectasis. No focal consolidation or pneumothorax. The central airways are patent. Upper Abdomen: No acute abnormality. Musculoskeletal: Degenerative changes of the spine. No acute osseous pathology. IMPRESSION: 1. No acute intrathoracic pathology.  No mass or adenopathy. 2. No CT evidence of pulmonary artery embolus. 3. Trace bilateral pleural effusions with bibasilar subsegmental compressive atelectasis. Electronically Signed   By: Anner Crete M.D.   On: 09/23/2020 17:18  Scheduled Meds: . atorvastatin  80 mg Oral Daily  . carbamazepine  400 mg Oral BID  . divalproex  750 mg Oral Q12H  . enoxaparin (LOVENOX) injection  40 mg Subcutaneous Q24H  . lisinopril  5 mg Oral QHS  . propranolol  20 mg Oral BID  . sodium chloride  1 g Oral Daily  . tamsulosin  0.4 mg Oral Daily   Continuous Infusions: . sodium chloride 100 mL/hr at 09/24/20 0205     LOS: 1 day    Time spent: 30 mins    Wyvonnia Dusky, MD [facility] Pager [phone]  If 7PM-7AM, please contact night-coverage [email or web address] 09/24/2020, 7:34 AM

## 2020-09-24 NOTE — Progress Notes (Signed)
Provider notified patient is hypotensive.  No parameters on propranolol Provider advised to hold dose at this time Pt is asymptomatic. Resting in bed comfortably. He appears to be in no apparent distress.  Call bell within reach.  Will continue to closely monitor.

## 2020-09-24 NOTE — Evaluation (Signed)
Physical Therapy Evaluation Patient Details Name: [name] MRN: [id number] DOB: 1961-05-07 Today's Date: 09/24/2020   History of Present Illness  Pt. is a 59 year old male who was admitted to [facility] with Bilateral UE,a nd LE weakness secondary to Hyponatremia. PMH includes: migraines, seuzires, GERD, and HTN.     Clinical Impression  Pt received in Semi-Fowler's position and agreeable to therapy.  Pt alert and oriented AxO x4.  Pt knows hospital well and has received care in past.  Pt currently eager to ambulate as he recalls a past hospitalization where he became much weaker due to not ambulating much during stay.  Pt reports he has been performing ankle circles/pumps during this recent stay.  Pt is eager to attempt to walk.  Pt performs bed mobility with CGA for safety and is able to come to seated position at EOB.  Pt then able to stand with use of FWW utilizing CGA and walker for support.  Pt ambulates down around pod and down around nursing station and back to room.  Pt required one short rest break approximately half-way through bout, and then proceeded back to room.  Pt IV was finished and nursing was notified about IV and mobility status for bathroom usage.  Pt transferred back to bed where he completed exercises listed below.  All pt needs met following conclusion of session and nursing present in room.  Pt will benefit from PT services in a HHPT setting upon discharge to safely address deficits listed in patient problem list for decreased caregiver assistance and eventual return to PLOF.       Follow Up Recommendations Home health PT    Equipment Recommendations  None recommended by PT    Recommendations for Other Services       Precautions / Restrictions Precautions Precautions: Fall Restrictions Weight Bearing Restrictions: No      Mobility  Bed Mobility Overal bed mobility: Modified Independent                Transfers Overall transfer level: Needs  assistance Equipment used: Rolling walker (2 wheeled) Transfers: Sit to/from Stand Sit to Stand: Min guard            Ambulation/Gait Ambulation/Gait assistance: Min guard Gait Distance (Feet): 240 Feet Assistive device: Rolling walker (2 wheeled) Gait Pattern/deviations: Step-through pattern;Drifts right/left;Staggering left Gait velocity: at baseline level   General Gait Details: Pt demonstrates step through pattern but drifts left with RW in the hallway. Cues needed to steer R to make sure his L UE doesn't hit objects. VCs needed for proper management of RW  Stairs            Wheelchair Mobility    Modified Rankin (Stroke Patients Only)       Balance Overall balance assessment: Needs assistance Sitting-balance support: Feet supported Sitting balance-Leahy Scale: Good Sitting balance - Comments: Patient able to maintain balance when feet are supported   Standing balance support: Bilateral upper extremity supported Standing balance-Leahy Scale: Fair Standing balance comment: Patient able to maintain balance BUE support on RW                             Pertinent Vitals/Pain Pain Assessment: No/denies pain    Home Living Family/patient expects to be discharged to:: Private residence Living Arrangements: Alone Available Help at Discharge: Friend(s);Available PRN/intermittently   Home Access: Stairs to enter Entrance Stairs-Rails: Right Entrance Stairs-Number of Steps: 3   Home  Equipment: Gilford Rile - 2 wheels      Prior Function Level of Independence: Independent with assistive device(s)         Comments: Pt reports that he only uses 2WW to get OOB in the mornings, but not for fxl mobility within the home or community. States he is completely INDEP including driving and med mgt. Endorses 1 fall in last 6 months. Difficulty with fine motor tasks at baseline 2/2 tremors (states has had tremors since childhood).     Hand Dominance   Dominant  Hand: Right    Extremity/Trunk Assessment   Upper Extremity Assessment Upper Extremity Assessment: Generalized weakness    Lower Extremity Assessment Lower Extremity Assessment: Generalized weakness    Cervical / Trunk Assessment Cervical / Trunk Assessment: Normal  Communication   Communication: No difficulties  Cognition Arousal/Alertness: Awake/alert Behavior During Therapy: WFL for tasks assessed/performed Overall Cognitive Status: Within Functional Limits for tasks assessed                                 General Comments: Tremuluous throughout, but pt reports this has been present since childhood. A&Ox4 and able to follow 1-2 step commands throughout.      General Comments      Exercises General Exercises - Lower Extremity Ankle Circles/Pumps: AROM;Both;5 reps;Seated Long Arc Quad: AROM;Strengthening;Both;10 reps;Seated Hip Flexion/Marching: AROM;Strengthening;Both;10 reps;Standing (2 reps) Other Exercises Other Exercises: Pt educated on role of PT and services provided during hospital stay.   Assessment/Plan    PT Assessment Patient needs continued PT services  PT Problem List Decreased strength;Decreased range of motion;Decreased activity tolerance;Decreased balance;Decreased mobility;Decreased coordination;Decreased knowledge of use of DME       PT Treatment Interventions DME instruction;Gait training;Stair training;Functional mobility training;Therapeutic activities;Therapeutic exercise;Balance training;Neuromuscular re-education    PT Goals (Current goals can be found in the Care Plan section)  Acute Rehab PT Goals Patient Stated Goal: To regain strength PT Goal Formulation: With patient Time For Goal Achievement: 10/08/20 Potential to Achieve Goals: Good    Frequency Min 2X/week   Barriers to discharge Decreased caregiver support      Co-evaluation               AM-PAC PT "6 Clicks" Mobility  Outcome Measure Help needed  turning from your back to your side while in a flat bed without using bedrails?: None Help needed moving from lying on your back to sitting on the side of a flat bed without using bedrails?: None Help needed moving to and from a bed to a chair (including a wheelchair)?: A Little Help needed standing up from a chair using your arms (e.g., wheelchair or bedside chair)?: A Little Help needed to walk in hospital room?: A Little Help needed climbing 3-5 steps with a railing? : A Little 6 Click Score: 20    End of Session Equipment Utilized During Treatment: Gait belt Activity Tolerance: Patient tolerated treatment well Patient left: with call bell/phone within reach;in bed;with bed alarm set;with nursing/sitter in room Nurse Communication: Mobility status PT Visit Diagnosis: Unsteadiness on feet (R26.81);Other abnormalities of gait and mobility (R26.89);Repeated falls (R29.6);History of falling (Z91.81);Muscle weakness (generalized) (M62.81);Difficulty in walking, not elsewhere classified (R26.2)    Time: 2130-8657 PT Time Calculation (min) (ACUTE ONLY): 50 min   Charges:   PT Evaluation $PT Eval Low Complexity: 1 Low PT Treatments $Gait Training: 23-37 mins $Therapeutic Exercise: 8-22 mins  Gwenlyn Saran, PT, DPT 09/24/20, 5:10 PM

## 2020-09-25 DIAGNOSIS — E871 Hypo-osmolality and hyponatremia: Secondary | ICD-10-CM | POA: Diagnosis not present

## 2020-09-25 DIAGNOSIS — I10 Essential (primary) hypertension: Secondary | ICD-10-CM | POA: Diagnosis not present

## 2020-09-25 LAB — BASIC METABOLIC PANEL
Anion gap: 7 (ref 5–15)
BUN: 10 mg/dL (ref 8–23)
CO2: 24 mmol/L (ref 22–32)
Calcium: 7.6 mg/dL — ABNORMAL LOW (ref 8.9–10.3)
Chloride: 98 mmol/L (ref 98–111)
Creatinine, Ser: 0.74 mg/dL (ref 0.61–1.24)
GFR, Estimated: >60 mL/min (ref >60)
Glucose, Bld: 85 mg/dL (ref 70–99)
Potassium: 4.1 mmol/L (ref 3.5–5.1)
Sodium: 129 mmol/L — ABNORMAL LOW (ref 135–145)

## 2020-09-25 LAB — CBC
HCT: 25.2 % — ABNORMAL LOW (ref 39.0–52.0)
Hemoglobin: 8.8 g/dL — ABNORMAL LOW (ref 13.0–17.0)
MCH: 33.8 pg (ref 26.0–34.0)
MCHC: 34.9 g/dL (ref 30.0–36.0)
MCV: 96.9 fL (ref 80.0–100.0)
Platelets: 151 10*3/uL (ref 150–400)
RBC: 2.6 MIL/uL — ABNORMAL LOW (ref 4.22–5.81)
RDW: 13.3 % (ref 11.5–15.5)
WBC: 3.6 10*3/uL — ABNORMAL LOW (ref 4.0–10.5)
nRBC: 0 % (ref 0.0–0.2)

## 2020-09-25 NOTE — Plan of Care (Signed)
Ambulated outside of room around nurses station with 1 person assist with walker. Tolerated well.   Problem: Activity: Goal: Risk for activity intolerance will decrease Outcome: Progressing

## 2020-09-25 NOTE — Progress Notes (Signed)
[facility]  ROUNDING NOTE   Subjective:   Patient eating breakfast when examined. He has no complaints.   Na 129 (124) (121) (122)  NS at 145mL/hr NaCl tabs.   Objective:  Vital signs in last 24 hours:  Temp:  [97.9 F (36.6 C)-98.5 F (36.9 C)] 98.5 F (36.9 C) (10/10 0827) Pulse Rate:  [60-72] 61 (10/10 0827) Resp:  [15-19] 18 (10/10 0827) BP: (86-113)/(58-74) 113/73 (10/10 0827) SpO2:  [98 %-100 %] 100 % (10/10 0827) Weight:  [54.4 kg] 54.4 kg (10/10 0425)  Weight change: -0.349 kg Filed Weights   09/22/20 2343 09/24/20 0416 09/25/20 0425  Weight: 54.4 kg 54.7 kg 54.4 kg    Intake/Output: I/O last 3 completed shifts: In: 1572 [P.O.:1680; I.V.:3000] Out: 1852 [Urine:1851; Stool:1]   Intake/Output this shift:  Total I/O In: 120 [P.O.:120] Out: 775 [Urine:775]  Physical Exam: General:  NAD  Head: Normocephalic, atraumatic. Moist oral mucosal membranes  Eyes: Anicteric  Neck: Supple  Lungs:  Clear to auscultation bilaterally, normal and symmetric respiratory effort  Heart:  regular  Abdomen:  Soft, nontender, nondistended  Extremities:  No peripheral edema.  Neurologic:  Alert, oriented  Skin: No acute lesions or rashes    Basic Metabolic Panel: Recent Labs  Lab 09/23/20 0230 09/23/20 0557 09/24/20 0345 09/25/20 0420  NA 122* 121* 124* 129*  K 5.0  --  3.7 4.1  CL 88*  --  97* 98  CO2 24  --  24 24  GLUCOSE 105*  --  96 85  BUN 22  --  11 10  CREATININE 1.03  --  0.90 0.74  CALCIUM 9.1  --  7.4* 7.6*    Liver Function Tests: Recent Labs  Lab 09/23/20 0230  AST 29  ALT 20  ALKPHOS 30*  BILITOT 0.9  PROT 6.8  ALBUMIN 3.6   No results for input(s): LIPASE, AMYLASE in the last 168 hours. No results for input(s): AMMONIA in the last 168 hours.  CBC: Recent Labs  Lab 09/23/20 0230 09/24/20 0345 09/25/20 0420  WBC 4.6 3.8* 3.6*  NEUTROABS 3.3  --   --   HGB 11.5* 8.8* 8.8*  HCT 32.5* 24.7* 25.2*  MCV 94.8 93.9 96.9   PLT 146* 134* 151    Cardiac Enzymes: No results for input(s): CKTOTAL, CKMB, CKMBINDEX, TROPONINI in the last 168 hours.  BNP: Invalid input(s): POCBNP  CBG: No results for input(s): GLUCAP in the last 168 hours.  Microbiology: Results for orders placed or performed during the hospital encounter of 09/23/20  Respiratory Panel by RT PCR (Flu A&B, Covid) - Nasopharyngeal Swab     Status: None   Collection Time: 09/23/20  5:57 AM   Specimen: Nasopharyngeal Swab  Result Value Ref Range Status   SARS Coronavirus 2 by RT PCR NEGATIVE NEGATIVE Final    Comment: (NOTE) SARS-CoV-2 target nucleic acids are NOT DETECTED.  The SARS-CoV-2 RNA is generally detectable in upper respiratoy specimens during the acute phase of infection. The lowest concentration of SARS-CoV-2 viral copies this assay can detect is 131 copies/mL. A negative result does not preclude SARS-Cov-2 infection and should not be used as the sole basis for treatment or other patient management decisions. A negative result may occur with  improper specimen collection/handling, submission of specimen other than nasopharyngeal swab, presence of viral mutation(s) within the areas targeted by this assay, and inadequate number of viral copies (<131 copies/mL). A negative result must be combined with clinical observations, patient history, and  epidemiological information. The expected result is Negative.  Fact Sheet for Patients:  PinkCheek.be  Fact Sheet for Healthcare Providers:  GravelBags.it  This test is no t yet approved or cleared by the [place] FDA and  has been authorized for detection and/or diagnosis of SARS-CoV-2 by FDA under an Emergency Use Authorization (EUA). This EUA will remain  in effect (meaning this test can be used) for the duration of the COVID-19 declaration under Section 564(b)(1) of the Act, 21 U.S.C. section 360bbb-3(b)(1), unless  the authorization is terminated or revoked sooner.     Influenza A by PCR NEGATIVE NEGATIVE Final   Influenza B by PCR NEGATIVE NEGATIVE Final    Comment: (NOTE) The Xpert Xpress SARS-CoV-2/FLU/RSV assay is intended as an aid in  the diagnosis of influenza from Nasopharyngeal swab specimens and  should not be used as a sole basis for treatment. Nasal washings and  aspirates are unacceptable for Xpert Xpress SARS-CoV-2/FLU/RSV  testing.  Fact Sheet for Patients: PinkCheek.be  Fact Sheet for Healthcare Providers: GravelBags.it  This test is not yet approved or cleared by the [place] FDA and  has been authorized for detection and/or diagnosis of SARS-CoV-2 by  FDA under an Emergency Use Authorization (EUA). This EUA will remain  in effect (meaning this test can be used) for the duration of the  Covid-19 declaration under Section 564(b)(1) of the Act, 21  U.S.C. section 360bbb-3(b)(1), unless the authorization is  terminated or revoked. Performed at [facility], [address] [place], [place] [address]     Coagulation Studies: No results for input(s): LABPROT, INR in the last 72 hours.  Urinalysis: No results for input(s): COLORURINE, LABSPEC, PHURINE, GLUCOSEU, HGBUR, BILIRUBINUR, KETONESUR, PROTEINUR, UROBILINOGEN, NITRITE, LEUKOCYTESUR in the last 72 hours.  Invalid input(s): APPERANCEUR    Imaging: CT CHEST W CONTRAST  Result Date: 09/23/2020 CLINICAL DATA:  59 year old male with concern for lung mass. EXAM: CT CHEST WITH CONTRAST TECHNIQUE: Multidetector CT imaging of the chest was performed during intravenous contrast administration. CONTRAST:  66mL OMNIPAQUE IOHEXOL 300 MG/ML  SOLN COMPARISON:  Chest radiograph dated 09/17/2020 and CT dated 10/27/2015. FINDINGS: Cardiovascular: There is no cardiomegaly or pericardial effusion. Coronary vascular calcification involving the LAD. The thoracic aorta  is unremarkable. The origins of the great vessels of the aortic arch appear patent. No pulmonary artery embolus identified. Mediastinum/Nodes: There is no hilar or mediastinal adenopathy. The esophagus and the thyroid gland are grossly unremarkable. No mediastinal fluid collection. Lungs/Pleura: There are trace bilateral pleural effusions with bibasilar subsegmental compressive atelectasis. No focal consolidation or pneumothorax. The central airways are patent. Upper Abdomen: No acute abnormality. Musculoskeletal: Degenerative changes of the spine. No acute osseous pathology. IMPRESSION: 1. No acute intrathoracic pathology.  No mass or adenopathy. 2. No CT evidence of pulmonary artery embolus. 3. Trace bilateral pleural effusions with bibasilar subsegmental compressive atelectasis. Electronically Signed   By: Anner Crete M.D.   On: 09/23/2020 17:18     Medications:   . sodium chloride 100 mL/hr at 09/24/20 0205   . atorvastatin  80 mg Oral Daily  . carbamazepine  400 mg Oral BID  . divalproex  750 mg Oral Q12H  . enoxaparin (LOVENOX) injection  40 mg Subcutaneous Q24H  . sodium chloride  1 g Oral Daily  . tamsulosin  0.4 mg Oral Daily   acetaminophen **OR** acetaminophen, ondansetron **OR** ondansetron (ZOFRAN) IV  Assessment/ Plan:  [name] is a 59 y.o. white male with seizures, hypertension, anemia,  GERD who was admitted to [facility] on 09/23/2020 for Hyponatremia [E87.1]  1. Hyponatremia: hypovolemic on examination - IV and PO replacement - serial sodium checks.  2. Hypertension: currently at goal. 113/73. Hold lisinopril and propanolol.  - tamsulosin.    LOS: 2 Billee Balcerzak 10/10/202110:45 AM

## 2020-09-25 NOTE — Progress Notes (Signed)
PROGRESS NOTE    [name]  NTI:[id number] DOB: October 15, 1961 DOA: 09/23/2020 PCP: [name], MD  Assessment & Plan:   Principal Problem:   Chronic hyponatremia Active Problems:   Essential hypertension   Generalized weakness   CKD (chronic kidney disease) stage 2, GFR 60-89 ml/min   Acute hyponatremia   Chronic anemia   Seizure disorder (HCC)   Acute on chronic hyponatremia: w/ hx of hyperosmolality. Continues to improve each day, currently at 129. Will continue on NaCl tabs. Nephro following and recs apprec   Generalized weakness: likely secondary to hyponatremia. PT recs HH and OT recs SNF. Pt wants to think about going to SNF today   HTN: continue to hold home dose of lisinopril, propanolol as per nephro   BPH: will continue on home dose of tamsulosin  HLD: continue on statin   CKDII: stable. Will continue to monitor   Normocytic anemia: no need for a transfusion at this time. Will continue to monitor   Thrombocytopenia: resolved   Seizure disorder: will continue on home dose of depakote, carbamazepine   DVT prophylaxis: lovenox  Code Status:  Full  Family Communication:  Disposition Plan: SNF vs HH. Pt wants to think about this  Status is: Inpatient  Remains inpatient appropriate because:Persistent severe electrolyte disturbances and IV treatments appropriate due to intensity of illness or inability to take PO   Dispo: The patient is from: Home              Anticipated d/c is to: SNF vs home health               Anticipated d/c date is: 2 days              Patient currently is not medically stable to d/c.    Consultants:   nephro    Procedures:    Antimicrobials:    Subjective: Pt c/o malaise   Objective: Vitals:   09/24/20 1646 09/24/20 2005 09/25/20 0021 09/25/20 0425  BP: (!) 88/61 91/66 (!) 98/58 109/74  Pulse: 69 72 64 64  Resp: 15  18 16   Temp: 98.5 F (36.9 C) 97.9 F (36.6 C) 98 F (36.7 C) 98.2 F (36.8 C)    TempSrc: Oral Oral Oral Oral  SpO2: 100% 100% 100% 98%  Weight:    54.4 kg  Height:        Intake/Output Summary (Last 24 hours) at 09/25/2020 0729 Last data filed at 09/25/2020 0400 Gross per 24 hour  Intake 4320 ml  Output 952 ml  Net 3368 ml   Filed Weights   09/22/20 2343 09/24/20 0416 09/25/20 0425  Weight: 54.4 kg 54.7 kg 54.4 kg    Examination:  General exam: Appears calm & comfortable. Disheveled.  Respiratory system: clear breath sounds b/l. No rubs or gallops  Cardiovascular system: S1/S2+. No rubs or gallops Gastrointestinal system: Abdomen is nondistended, soft and nontender. Normal bowel sounds heard. Central nervous system: Alert and oriented. Moves all 4 extremities  Psychiatry: Judgement and insight appear normal.     Data Reviewed: I have personally reviewed following labs and imaging studies  CBC: Recent Labs  Lab 09/23/20 0230 09/24/20 0345 09/25/20 0420  WBC 4.6 3.8* 3.6*  NEUTROABS 3.3  --   --   HGB 11.5* 8.8* 8.8*  HCT 32.5* 24.7* 25.2*  MCV 94.8 93.9 96.9  PLT 146* 134* 867   Basic Metabolic Panel: Recent Labs  Lab 09/23/20 0230 09/23/20 0557 09/24/20 0345 09/25/20 0420  NA  122* 121* 124* 129*  K 5.0  --  3.7 4.1  CL 88*  --  97* 98  CO2 24  --  24 24  GLUCOSE 105*  --  96 85  BUN 22  --  11 10  CREATININE 1.03  --  0.90 0.74  CALCIUM 9.1  --  7.4* 7.6*   GFR: Estimated Creatinine Clearance: 74.6 mL/min (by C-G formula based on SCr of 0.74 mg/dL). Liver Function Tests: Recent Labs  Lab 09/23/20 0230  AST 29  ALT 20  ALKPHOS 30*  BILITOT 0.9  PROT 6.8  ALBUMIN 3.6   No results for input(s): LIPASE, AMYLASE in the last 168 hours. No results for input(s): AMMONIA in the last 168 hours. Coagulation Profile: No results for input(s): INR, PROTIME in the last 168 hours. Cardiac Enzymes: No results for input(s): CKTOTAL, CKMB, CKMBINDEX, TROPONINI in the last 168 hours. BNP (last 3 results) No results for input(s):  PROBNP in the last 8760 hours. HbA1C: No results for input(s): HGBA1C in the last 72 hours. CBG: No results for input(s): GLUCAP in the last 168 hours. Lipid Profile: No results for input(s): CHOL, HDL, LDLCALC, TRIG, CHOLHDL, LDLDIRECT in the last 72 hours. Thyroid Function Tests: No results for input(s): TSH, T4TOTAL, FREET4, T3FREE, THYROIDAB in the last 72 hours. Anemia Panel: No results for input(s): VITAMINB12, FOLATE, FERRITIN, TIBC, IRON, RETICCTPCT in the last 72 hours. Sepsis Labs: No results for input(s): PROCALCITON, LATICACIDVEN in the last 168 hours.  Recent Results (from the past 240 hour(s))  Respiratory Panel by RT PCR (Flu A&B, Covid) - Nasopharyngeal Swab     Status: None   Collection Time: 09/17/20  2:29 AM   Specimen: Nasopharyngeal Swab  Result Value Ref Range Status   SARS Coronavirus 2 by RT PCR NEGATIVE NEGATIVE Final    Comment: (NOTE) SARS-CoV-2 target nucleic acids are NOT DETECTED.  The SARS-CoV-2 RNA is generally detectable in upper respiratoy specimens during the acute phase of infection. The lowest concentration of SARS-CoV-2 viral copies this assay can detect is 131 copies/mL. A negative result does not preclude SARS-Cov-2 infection and should not be used as the sole basis for treatment or other patient management decisions. A negative result may occur with  improper specimen collection/handling, submission of specimen other than nasopharyngeal swab, presence of viral mutation(s) within the areas targeted by this assay, and inadequate number of viral copies (<131 copies/mL). A negative result must be combined with clinical observations, patient history, and epidemiological information. The expected result is Negative.  Fact Sheet for Patients:  PinkCheek.be  Fact Sheet for Healthcare Providers:  GravelBags.it  This test is no t yet approved or cleared by the [place] FDA and  has  been authorized for detection and/or diagnosis of SARS-CoV-2 by FDA under an Emergency Use Authorization (EUA). This EUA will remain  in effect (meaning this test can be used) for the duration of the COVID-19 declaration under Section 564(b)(1) of the Act, 21 U.S.C. section 360bbb-3(b)(1), unless the authorization is terminated or revoked sooner.     Influenza A by PCR NEGATIVE NEGATIVE Final   Influenza B by PCR NEGATIVE NEGATIVE Final    Comment: (NOTE) The Xpert Xpress SARS-CoV-2/FLU/RSV assay is intended as an aid in  the diagnosis of influenza from Nasopharyngeal swab specimens and  should not be used as a sole basis for treatment. Nasal washings and  aspirates are unacceptable for Xpert Xpress SARS-CoV-2/FLU/RSV  testing.  Fact Sheet for Patients: PinkCheek.be  Fact  Sheet for Healthcare Providers: GravelBags.it  This test is not yet approved or cleared by the [place] and  has been authorized for detection and/or diagnosis of SARS-CoV-2 by  FDA under an Emergency Use Authorization (EUA). This EUA will remain  in effect (meaning this test can be used) for the duration of the  Covid-19 declaration under Section 564(b)(1) of the Act, 21  U.S.C. section 360bbb-3(b)(1), unless the authorization is  terminated or revoked. Performed at [facility], [address] [place], [place] [address]   Respiratory Panel by RT PCR (Flu A&B, Covid) - Nasopharyngeal Swab     Status: None   Collection Time: 09/23/20  5:57 AM   Specimen: Nasopharyngeal Swab  Result Value Ref Range Status   SARS Coronavirus 2 by RT PCR NEGATIVE NEGATIVE Final    Comment: (NOTE) SARS-CoV-2 target nucleic acids are NOT DETECTED.  The SARS-CoV-2 RNA is generally detectable in upper respiratoy specimens during the acute phase of infection. The lowest concentration of SARS-CoV-2 viral copies this assay can detect is 131 copies/mL. A  negative result does not preclude SARS-Cov-2 infection and should not be used as the sole basis for treatment or other patient management decisions. A negative result may occur with  improper specimen collection/handling, submission of specimen other than nasopharyngeal swab, presence of viral mutation(s) within the areas targeted by this assay, and inadequate number of viral copies (<131 copies/mL). A negative result must be combined with clinical observations, patient history, and epidemiological information. The expected result is Negative.  Fact Sheet for Patients:  PinkCheek.be  Fact Sheet for Healthcare Providers:  GravelBags.it  This test is no t yet approved or cleared by the [place] FDA and  has been authorized for detection and/or diagnosis of SARS-CoV-2 by FDA under an Emergency Use Authorization (EUA). This EUA will remain  in effect (meaning this test can be used) for the duration of the COVID-19 declaration under Section 564(b)(1) of the Act, 21 U.S.C. section 360bbb-3(b)(1), unless the authorization is terminated or revoked sooner.     Influenza A by PCR NEGATIVE NEGATIVE Final   Influenza B by PCR NEGATIVE NEGATIVE Final    Comment: (NOTE) The Xpert Xpress SARS-CoV-2/FLU/RSV assay is intended as an aid in  the diagnosis of influenza from Nasopharyngeal swab specimens and  should not be used as a sole basis for treatment. Nasal washings and  aspirates are unacceptable for Xpert Xpress SARS-CoV-2/FLU/RSV  testing.  Fact Sheet for Patients: PinkCheek.be  Fact Sheet for Healthcare Providers: GravelBags.it  This test is not yet approved or cleared by the [place] FDA and  has been authorized for detection and/or diagnosis of SARS-CoV-2 by  FDA under an Emergency Use Authorization (EUA). This EUA will remain  in effect (meaning this test can  be used) for the duration of the  Covid-19 declaration under Section 564(b)(1) of the Act, 21  U.S.C. section 360bbb-3(b)(1), unless the authorization is  terminated or revoked. Performed at [facility], [address] [place], [place] [address]          Radiology Studies: CT CHEST W CONTRAST  Result Date: 09/23/2020 CLINICAL DATA:  59 year old male with concern for lung mass. EXAM: CT CHEST WITH CONTRAST TECHNIQUE: Multidetector CT imaging of the chest was performed during intravenous contrast administration. CONTRAST:  69mL OMNIPAQUE IOHEXOL 300 MG/ML  SOLN COMPARISON:  Chest radiograph dated 09/17/2020 and CT dated 10/27/2015. FINDINGS: Cardiovascular: There is no cardiomegaly or pericardial effusion. Coronary vascular calcification involving the  LAD. The thoracic aorta is unremarkable. The origins of the great vessels of the aortic arch appear patent. No pulmonary artery embolus identified. Mediastinum/Nodes: There is no hilar or mediastinal adenopathy. The esophagus and the thyroid gland are grossly unremarkable. No mediastinal fluid collection. Lungs/Pleura: There are trace bilateral pleural effusions with bibasilar subsegmental compressive atelectasis. No focal consolidation or pneumothorax. The central airways are patent. Upper Abdomen: No acute abnormality. Musculoskeletal: Degenerative changes of the spine. No acute osseous pathology. IMPRESSION: 1. No acute intrathoracic pathology.  No mass or adenopathy. 2. No CT evidence of pulmonary artery embolus. 3. Trace bilateral pleural effusions with bibasilar subsegmental compressive atelectasis. Electronically Signed   By: Anner Crete M.D.   On: 09/23/2020 17:18        Scheduled Meds:  atorvastatin  80 mg Oral Daily   carbamazepine  400 mg Oral BID   divalproex  750 mg Oral Q12H   enoxaparin (LOVENOX) injection  40 mg Subcutaneous Q24H   sodium chloride  1 g Oral Daily   tamsulosin  0.4 mg Oral Daily    Continuous Infusions:  sodium chloride 100 mL/hr at 09/24/20 0205     LOS: 2 days    Time spent: 32 mins    Wyvonnia Dusky, MD [facility] Pager [phone]  If 7PM-7AM, please contact night-coverage [email or web address] 09/25/2020, 7:29 AM

## 2020-09-26 DIAGNOSIS — E871 Hypo-osmolality and hyponatremia: Secondary | ICD-10-CM | POA: Diagnosis not present

## 2020-09-26 DIAGNOSIS — I10 Essential (primary) hypertension: Secondary | ICD-10-CM | POA: Diagnosis not present

## 2020-09-26 LAB — BASIC METABOLIC PANEL
Anion gap: 5 (ref 5–15)
BUN: 13 mg/dL (ref 8–23)
CO2: 25 mmol/L (ref 22–32)
Calcium: 7.7 mg/dL — ABNORMAL LOW (ref 8.9–10.3)
Chloride: 98 mmol/L (ref 98–111)
Creatinine, Ser: 0.85 mg/dL (ref 0.61–1.24)
GFR, Estimated: >60 mL/min (ref >60)
Glucose, Bld: 83 mg/dL (ref 70–99)
Potassium: 4.2 mmol/L (ref 3.5–5.1)
Sodium: 128 mmol/L — ABNORMAL LOW (ref 135–145)

## 2020-09-26 LAB — CBC
HCT: 25 % — ABNORMAL LOW (ref 39.0–52.0)
Hemoglobin: 8.6 g/dL — ABNORMAL LOW (ref 13.0–17.0)
MCH: 33.3 pg (ref 26.0–34.0)
MCHC: 34.4 g/dL (ref 30.0–36.0)
MCV: 96.9 fL (ref 80.0–100.0)
Platelets: 169 10*3/uL (ref 150–400)
RBC: 2.58 MIL/uL — ABNORMAL LOW (ref 4.22–5.81)
RDW: 13.2 % (ref 11.5–15.5)
WBC: 3.7 10*3/uL — ABNORMAL LOW (ref 4.0–10.5)
nRBC: 0 % (ref 0.0–0.2)

## 2020-09-26 MED ORDER — SODIUM CHLORIDE 1 G PO TABS
2.0000 g | ORAL_TABLET | Freq: Three times a day (TID) | ORAL | Status: DC
  Administered 2020-09-26 – 2020-09-29 (×8): 2 g via ORAL
  Filled 2020-09-26 (×11): qty 2

## 2020-09-26 NOTE — TOC Initial Note (Addendum)
Transition of Care [facility]) - Initial/Assessment Note    Patient Details  Name: [name] MRN: [id number] Date of Birth: 1961/01/18  Transition of [facility]) CM/SW Contact:    Beverly Sessions, RN Phone Number: 09/26/2020, 4:09 PM  Clinical Narrative:                 Patient admitted from chronic hyponatremia.  Patient lives at home alone.  Patient states that he will no longer be driving.  States that he has a friend Kirby Crigler that will assist with transportation, or utilizes [employer]. Patient states that he will be ordering grocery delivery from [employer]   Patient states that he uses [facility] and denies issues obtaining medications.   PCP [place].   PT has assessed patient and recommends home health.  Patient in agreement and states that he has no preference of home health agency.  Referral made and accepted by [facility] with [place].  Referral to [facility] with [employer] for 3 in 1    Expected Discharge Plan: [place] Barriers to Discharge: Continued Medical Work up   Patient Goals and CMS Choice        Expected Discharge Plan and Services Expected Discharge Plan: [place]   Discharge Planning Services: CM Consult   Living arrangements for the past 2 months: Apartment                           HH Arranged: RN, PT, OT [place] Agency: [place] ([place]) Date HH Agency Contacted: 09/26/20   Representative spoke with at [place]: Corene Cornea  Prior Living Arrangements/Services Living arrangements for the past 2 months: Apartment Lives with:: Self Patient language and need for interpreter reviewed:: Yes Do you feel safe going back to the place where you live?: Yes      Need for Family Participation in Patient Care: No (Comment) Care giver support system in place?: Yes (comment) Current home services: DME Criminal Activity/Legal Involvement Pertinent to Current Situation/Hospitalization:  No - Comment as needed  Activities of Daily Living Home Assistive Devices/Equipment: Blood pressure cuff, Walker (specify type) ADL Screening (condition at time of admission) Patient's cognitive ability adequate to safely complete daily activities?: Yes Is the patient deaf or have difficulty hearing?: No Does the patient have difficulty seeing, even when wearing glasses/contacts?: No Does the patient have difficulty concentrating, remembering, or making decisions?: No Patient able to express need for assistance with ADLs?: Yes Does the patient have difficulty dressing or bathing?: No Independently performs ADLs?: Yes (appropriate for developmental age) Does the patient have difficulty walking or climbing stairs?: No Weakness of Legs: None Weakness of Arms/Hands: Both  Permission Sought/Granted                  Emotional Assessment       Orientation: : Oriented to Self, Oriented to Place, Oriented to  Time, Oriented to Situation   Psych Involvement: No (comment)  Admission diagnosis:  Hyponatremia [E87.1] Patient Active Problem List   Diagnosis Date Noted  . CKD (chronic kidney disease) stage 2, GFR 60-89 ml/min 09/23/2020  . Acute hyponatremia 09/23/2020  . Chronic anemia 09/23/2020  . Seizure disorder ([place]) 09/23/2020  . Diarrhea   . Generalized weakness   . Ataxia 06/19/2020  . Iron deficiency anemia, unspecified   . Benign neoplasm of ascending colon   . Heartburn   . Gastritis   .  Altered mental state   . Acute delirium 10/31/2015  . Fall   . Dizziness   . Laceration of scalp   . Near syncope 10/27/2015  . Poverty 10/27/2015  . Symptomatic anemia   . Early satiety   . FTT (failure to thrive) in adult   . Protein-calorie malnutrition, severe ([place]) 09/07/2015  . Chronic hyponatremia 09/05/2015  . Elevated troponin 09/05/2015  . Hypokalemia 09/05/2015  . Leukopenia 09/05/2015  . Renal insufficiency 09/05/2015  . Tremor, essential 07/15/2015  . Essential  hypertension 07/15/2015  . Chronic renal insufficiency 07/15/2015  . Leg swelling 07/15/2015  . Partial epilepsy with impairment of consciousness ([place]) 05/19/2015  . Chronic kidney disease (CKD), stage III (moderate) ([place]) 05/19/2015  . Absolute anemia 05/19/2015  . Chest pain 04/17/2015   PCP:  [name], MD Pharmacy:   [facility] [address] [place] [address] Phone: [phone] Fax: [phone]  [facility] [place], [place] [address] [place] [address] Phone: [phone] Fax: [phone]     Social Determinants of Health (SDOH) Interventions    Readmission Risk Interventions No flowsheet data found.

## 2020-09-26 NOTE — Progress Notes (Signed)
[facility]  ROUNDING NOTE   Subjective:   Patient denies nausea,vomiting, SOB,reports fair appetite. Na + today is 128.  Objective:  Vital signs in last 24 hours:  Temp:  [97.7 F (36.5 C)-98.7 F (37.1 C)] 98.7 F (37.1 C) (10/11 1116) Pulse Rate:  [57-76] 73 (10/11 1116) Resp:  [15-19] 18 (10/11 1116) BP: (104-115)/(63-71) 108/68 (10/11 1116) SpO2:  [98 %-100 %] 99 % (10/11 1116) Weight:  [59.2 kg] 59.2 kg (10/11 0332)  Weight change: 4.795 kg Filed Weights   09/24/20 0416 09/25/20 0425 09/26/20 0332  Weight: 54.7 kg 54.4 kg 59.2 kg    Intake/Output: I/O last 3 completed shifts: In: 4256.7 [P.O.:120; I.V.:4136.7] Out: 2376 [Urine:3551; Stool:1]   Intake/Output this shift:  Total I/O In: 360 [P.O.:360] Out: 1325 [Urine:1325]  Physical Exam: General:  Resting in bed, in good spirits  Head: Normocephalic, atraumatic. Moist oral mucosal membranes  Eyes: Anicteric  Neck: Supple  Lungs:   normal and symmetric respiratory effort,lungs clear  Heart:  S1S2, regular  Abdomen:  Soft, nontender, nondistended  Extremities:  No peripheral edema.  Neurologic:  Alert,Awake,speech clear and appropriate  Skin: No acute lesions or rashes    Basic Metabolic Panel: Recent Labs  Lab 09/23/20 0230 09/23/20 0230 09/23/20 0557 09/24/20 0345 09/25/20 0420 09/26/20 0426  NA 122*  --  121* 124* 129* 128*  K 5.0  --   --  3.7 4.1 4.2  CL 88*  --   --  97* 98 98  CO2 24  --   --  24 24 25   GLUCOSE 105*  --   --  96 85 83  BUN 22  --   --  11 10 13   CREATININE 1.03  --   --  0.90 0.74 0.85  CALCIUM 9.1   < >  --  7.4* 7.6* 7.7*   < > = values in this interval not displayed.    Liver Function Tests: Recent Labs  Lab 09/23/20 0230  AST 29  ALT 20  ALKPHOS 30*  BILITOT 0.9  PROT 6.8  ALBUMIN 3.6   No results for input(s): LIPASE, AMYLASE in the last 168 hours. No results for input(s): AMMONIA in the last 168 hours.  CBC: Recent Labs  Lab  09/23/20 0230 09/24/20 0345 09/25/20 0420 09/26/20 0426  WBC 4.6 3.8* 3.6* 3.7*  NEUTROABS 3.3  --   --   --   HGB 11.5* 8.8* 8.8* 8.6*  HCT 32.5* 24.7* 25.2* 25.0*  MCV 94.8 93.9 96.9 96.9  PLT 146* 134* 151 169    Cardiac Enzymes: No results for input(s): CKTOTAL, CKMB, CKMBINDEX, TROPONINI in the last 168 hours.  BNP: Invalid input(s): POCBNP  CBG: No results for input(s): GLUCAP in the last 168 hours.  Microbiology: Results for orders placed or performed during the hospital encounter of 09/23/20  Respiratory Panel by RT PCR (Flu A&B, Covid) - Nasopharyngeal Swab     Status: None   Collection Time: 09/23/20  5:57 AM   Specimen: Nasopharyngeal Swab  Result Value Ref Range Status   SARS Coronavirus 2 by RT PCR NEGATIVE NEGATIVE Final    Comment: (NOTE) SARS-CoV-2 target nucleic acids are NOT DETECTED.  The SARS-CoV-2 RNA is generally detectable in upper respiratoy specimens during the acute phase of infection. The lowest concentration of SARS-CoV-2 viral copies this assay can detect is 131 copies/mL. A negative result does not preclude SARS-Cov-2 infection and should not be used as the sole basis for treatment or  other patient management decisions. A negative result may occur with  improper specimen collection/handling, submission of specimen other than nasopharyngeal swab, presence of viral mutation(s) within the areas targeted by this assay, and inadequate number of viral copies (<131 copies/mL). A negative result must be combined with clinical observations, patient history, and epidemiological information. The expected result is Negative.  Fact Sheet for Patients:  PinkCheek.be  Fact Sheet for Healthcare Providers:  GravelBags.it  This test is no t yet approved or cleared by the [place] FDA and  has been authorized for detection and/or diagnosis of SARS-CoV-2 by FDA under an Emergency Use  Authorization (EUA). This EUA will remain  in effect (meaning this test can be used) for the duration of the COVID-19 declaration under Section 564(b)(1) of the Act, 21 U.S.C. section 360bbb-3(b)(1), unless the authorization is terminated or revoked sooner.     Influenza A by PCR NEGATIVE NEGATIVE Final   Influenza B by PCR NEGATIVE NEGATIVE Final    Comment: (NOTE) The Xpert Xpress SARS-CoV-2/FLU/RSV assay is intended as an aid in  the diagnosis of influenza from Nasopharyngeal swab specimens and  should not be used as a sole basis for treatment. Nasal washings and  aspirates are unacceptable for Xpert Xpress SARS-CoV-2/FLU/RSV  testing.  Fact Sheet for Patients: PinkCheek.be  Fact Sheet for Healthcare Providers: GravelBags.it  This test is not yet approved or cleared by the [place] FDA and  has been authorized for detection and/or diagnosis of SARS-CoV-2 by  FDA under an Emergency Use Authorization (EUA). This EUA will remain  in effect (meaning this test can be used) for the duration of the  Covid-19 declaration under Section 564(b)(1) of the Act, 21  U.S.C. section 360bbb-3(b)(1), unless the authorization is  terminated or revoked. Performed at [facility], [address] [place], [place] [address]     Coagulation Studies: No results for input(s): LABPROT, INR in the last 72 hours.  Urinalysis: No results for input(s): COLORURINE, LABSPEC, PHURINE, GLUCOSEU, HGBUR, BILIRUBINUR, KETONESUR, PROTEINUR, UROBILINOGEN, NITRITE, LEUKOCYTESUR in the last 72 hours.  Invalid input(s): APPERANCEUR    Imaging: No results found.   Medications:    . atorvastatin  80 mg Oral Daily  . carbamazepine  400 mg Oral BID  . divalproex  750 mg Oral Q12H  . enoxaparin (LOVENOX) injection  40 mg Subcutaneous Q24H  . sodium chloride  2 g Oral TID WC  . tamsulosin  0.4 mg Oral Daily   acetaminophen **OR**  acetaminophen, ondansetron **OR** ondansetron (ZOFRAN) IV  Assessment/ Plan:  [name] is a 59 y.o. white male with seizures, hypertension, anemia, GERD who was admitted to [facility] on 09/23/2020 for Hyponatremia [E87.1]  1. Hyponatremia  Na+ 128 today Increased Sodium Chloride tablets to 2gm TID Encourage free fluid restriction  2. Hypertension:  Antihypertensives currently on hold Blood pressure readings within low normal range   LOS: 3 Georgette Helmer 10/11/202112:24 PM

## 2020-09-26 NOTE — Plan of Care (Signed)

## 2020-09-26 NOTE — Progress Notes (Signed)
PROGRESS NOTE    [name]  UUV:[id number] DOB: [birth year] DOA: 09/23/2020 PCP: [name], MD  Assessment & Plan:   Principal Problem:   Chronic hyponatremia Active Problems:   Essential hypertension   Generalized weakness   CKD (chronic kidney disease) stage 2, GFR 60-89 ml/min   Acute hyponatremia   Chronic anemia   Seizure disorder (HCC)   Acute on chronic hyponatremia: w/ hx of hyperosmolality. Na is labile, currently 128. Increased NaCl tabs to 2 gm and fluid restrict to 1200 ml as per nephro   Generalized weakness: likely secondary to hyponatremia. HH orders placed as pt does not want to go to SNF  HTN: continue to hold home dose of lisinopril, propanolol as per nephro   BPH: will continue on home dose of tamsulosin  HLD: continue on statin   CKDII: stable.   Normocytic anemia: will transfer if Hb <7. Will continue to monitor   Thrombocytopenia: resolved   Seizure disorder: will continue on home dose of depakote, carbamazepine   DVT prophylaxis: lovenox  Code Status:  Full  Family Communication:  Disposition Plan: likely d/c home health   Status is: Inpatient  Remains inpatient appropriate because:Persistent severe electrolyte disturbances and IV treatments appropriate due to intensity of illness or inability to take PO   Dispo: The patient is from: Home              Anticipated d/c is to: home health               Anticipated d/c date is:  1 or 2 days               Patient currently is not medically stable to d/c.    Consultants:   nephro    Procedures:    Antimicrobials:    Subjective: Pt c/o fatigue   Objective: Vitals:   09/25/20 1615 09/25/20 1917 09/26/20 0330 09/26/20 0332  BP: 104/70 112/69 115/63   Pulse: 70 76 69   Resp: 15 19 19    Temp: 98.5 F (36.9 C) 97.7 F (36.5 C) 97.9 F (36.6 C)   TempSrc: Oral Oral Oral   SpO2: 100% 98% 99%   Weight:    59.2 kg  Height:        Intake/Output Summary (Last 24  hours) at 09/26/2020 0717 Last data filed at 09/26/2020 0640 Gross per 24 hour  Intake 1256.71 ml  Output 3100 ml  Net -1843.29 ml   Filed Weights   09/24/20 0416 09/25/20 0425 09/26/20 0332  Weight: 54.7 kg 54.4 kg 59.2 kg    Examination:  General exam: Appears calm & comfortable. Disheveled. Poor dentition. Appears older than stated age Respiratory system: clear breath sounds b/l. No rales or wheezes  Cardiovascular system: S1/S2+. No rubs or gallops Gastrointestinal system: Abdomen is nondistended, soft and nontender. Normal bowel sounds heard. Central nervous system: Alert and oriented. Moves all 4 extremities  Psychiatry: Judgement and insight appear normal. Normal mood and affect     Data Reviewed: I have personally reviewed following labs and imaging studies  CBC: Recent Labs  Lab 09/23/20 0230 09/24/20 0345 09/25/20 0420 09/26/20 0426  WBC 4.6 3.8* 3.6* 3.7*  NEUTROABS 3.3  --   --   --   HGB 11.5* 8.8* 8.8* 8.6*  HCT 32.5* 24.7* 25.2* 25.0*  MCV 94.8 93.9 96.9 96.9  PLT 146* 134* 151 474   Basic Metabolic Panel: Recent Labs  Lab 09/23/20 0230 09/23/20 0557 09/24/20 0345 09/25/20 0420  09/26/20 0426  NA 122* 121* 124* 129* 128*  K 5.0  --  3.7 4.1 4.2  CL 88*  --  97* 98 98  CO2 24  --  24 24 25   GLUCOSE 105*  --  96 85 83  BUN 22  --  11 10 13   CREATININE 1.03  --  0.90 0.74 0.85  CALCIUM 9.1  --  7.4* 7.6* 7.7*   GFR: Estimated Creatinine Clearance: 73.4 mL/min (by C-G formula based on SCr of 0.85 mg/dL). Liver Function Tests: Recent Labs  Lab 09/23/20 0230  AST 29  ALT 20  ALKPHOS 30*  BILITOT 0.9  PROT 6.8  ALBUMIN 3.6   No results for input(s): LIPASE, AMYLASE in the last 168 hours. No results for input(s): AMMONIA in the last 168 hours. Coagulation Profile: No results for input(s): INR, PROTIME in the last 168 hours. Cardiac Enzymes: No results for input(s): CKTOTAL, CKMB, CKMBINDEX, TROPONINI in the last 168 hours. BNP (last 3  results) No results for input(s): PROBNP in the last 8760 hours. HbA1C: No results for input(s): HGBA1C in the last 72 hours. CBG: No results for input(s): GLUCAP in the last 168 hours. Lipid Profile: No results for input(s): CHOL, HDL, LDLCALC, TRIG, CHOLHDL, LDLDIRECT in the last 72 hours. Thyroid Function Tests: No results for input(s): TSH, T4TOTAL, FREET4, T3FREE, THYROIDAB in the last 72 hours. Anemia Panel: No results for input(s): VITAMINB12, FOLATE, FERRITIN, TIBC, IRON, RETICCTPCT in the last 72 hours. Sepsis Labs: No results for input(s): PROCALCITON, LATICACIDVEN in the last 168 hours.  Recent Results (from the past 240 hour(s))  Respiratory Panel by RT PCR (Flu A&B, Covid) - Nasopharyngeal Swab     Status: None   Collection Time: 09/17/20  2:29 AM   Specimen: Nasopharyngeal Swab  Result Value Ref Range Status   SARS Coronavirus 2 by RT PCR NEGATIVE NEGATIVE Final    Comment: (NOTE) SARS-CoV-2 target nucleic acids are NOT DETECTED.  The SARS-CoV-2 RNA is generally detectable in upper respiratoy specimens during the acute phase of infection. The lowest concentration of SARS-CoV-2 viral copies this assay can detect is 131 copies/mL. A negative result does not preclude SARS-Cov-2 infection and should not be used as the sole basis for treatment or other patient management decisions. A negative result may occur with  improper specimen collection/handling, submission of specimen other than nasopharyngeal swab, presence of viral mutation(s) within the areas targeted by this assay, and inadequate number of viral copies (<131 copies/mL). A negative result must be combined with clinical observations, patient history, and epidemiological information. The expected result is Negative.  Fact Sheet for Patients:  PinkCheek.be  Fact Sheet for Healthcare Providers:  GravelBags.it  This test is no t yet approved or cleared  by the [place] FDA and  has been authorized for detection and/or diagnosis of SARS-CoV-2 by FDA under an Emergency Use Authorization (EUA). This EUA will remain  in effect (meaning this test can be used) for the duration of the COVID-19 declaration under Section 564(b)(1) of the Act, 21 U.S.C. section 360bbb-3(b)(1), unless the authorization is terminated or revoked sooner.     Influenza A by PCR NEGATIVE NEGATIVE Final   Influenza B by PCR NEGATIVE NEGATIVE Final    Comment: (NOTE) The Xpert Xpress SARS-CoV-2/FLU/RSV assay is intended as an aid in  the diagnosis of influenza from Nasopharyngeal swab specimens and  should not be used as a sole basis for treatment. Nasal washings and  aspirates are unacceptable for Xpert  Xpress SARS-CoV-2/FLU/RSV  testing.  Fact Sheet for Patients: PinkCheek.be  Fact Sheet for Healthcare Providers: GravelBags.it  This test is not yet approved or cleared by the [place] FDA and  has been authorized for detection and/or diagnosis of SARS-CoV-2 by  FDA under an Emergency Use Authorization (EUA). This EUA will remain  in effect (meaning this test can be used) for the duration of the  Covid-19 declaration under Section 564(b)(1) of the Act, 21  U.S.C. section 360bbb-3(b)(1), unless the authorization is  terminated or revoked. Performed at [facility], [address] [place], [place] [address]   Respiratory Panel by RT PCR (Flu A&B, Covid) - Nasopharyngeal Swab     Status: None   Collection Time: 09/23/20  5:57 AM   Specimen: Nasopharyngeal Swab  Result Value Ref Range Status   SARS Coronavirus 2 by RT PCR NEGATIVE NEGATIVE Final    Comment: (NOTE) SARS-CoV-2 target nucleic acids are NOT DETECTED.  The SARS-CoV-2 RNA is generally detectable in upper respiratoy specimens during the acute phase of infection. The lowest concentration of SARS-CoV-2 viral copies this assay  can detect is 131 copies/mL. A negative result does not preclude SARS-Cov-2 infection and should not be used as the sole basis for treatment or other patient management decisions. A negative result may occur with  improper specimen collection/handling, submission of specimen other than nasopharyngeal swab, presence of viral mutation(s) within the areas targeted by this assay, and inadequate number of viral copies (<131 copies/mL). A negative result must be combined with clinical observations, patient history, and epidemiological information. The expected result is Negative.  Fact Sheet for Patients:  PinkCheek.be  Fact Sheet for Healthcare Providers:  GravelBags.it  This test is no t yet approved or cleared by the [place] FDA and  has been authorized for detection and/or diagnosis of SARS-CoV-2 by FDA under an Emergency Use Authorization (EUA). This EUA will remain  in effect (meaning this test can be used) for the duration of the COVID-19 declaration under Section 564(b)(1) of the Act, 21 U.S.C. section 360bbb-3(b)(1), unless the authorization is terminated or revoked sooner.     Influenza A by PCR NEGATIVE NEGATIVE Final   Influenza B by PCR NEGATIVE NEGATIVE Final    Comment: (NOTE) The Xpert Xpress SARS-CoV-2/FLU/RSV assay is intended as an aid in  the diagnosis of influenza from Nasopharyngeal swab specimens and  should not be used as a sole basis for treatment. Nasal washings and  aspirates are unacceptable for Xpert Xpress SARS-CoV-2/FLU/RSV  testing.  Fact Sheet for Patients: PinkCheek.be  Fact Sheet for Healthcare Providers: GravelBags.it  This test is not yet approved or cleared by the [place] FDA and  has been authorized for detection and/or diagnosis of SARS-CoV-2 by  FDA under an Emergency Use Authorization (EUA). This EUA will remain    in effect (meaning this test can be used) for the duration of the  Covid-19 declaration under Section 564(b)(1) of the Act, 21  U.S.C. section 360bbb-3(b)(1), unless the authorization is  terminated or revoked. Performed at [facility], [address], [place], [place] [address]          Radiology Studies: No results found.      Scheduled Meds: . atorvastatin  80 mg Oral Daily  . carbamazepine  400 mg Oral BID  . divalproex  750 mg Oral Q12H  . enoxaparin (LOVENOX) injection  40 mg Subcutaneous Q24H  . sodium chloride  1 g Oral Daily  . tamsulosin  0.4 mg Oral Daily   Continuous Infusions: . sodium chloride 100 mL/hr at 09/26/20 0618     LOS: 3 days    Time spent: 31 mins    Wyvonnia Dusky, MD [facility] Pager [phone]  If 7PM-7AM, please contact night-coverage [email or web address] 09/26/2020, 7:17 AM

## 2020-09-26 NOTE — Progress Notes (Signed)
Occupational Therapy Treatment Patient Details Name: [name] MRN: [id number] DOB: May 20, 1961 Today's Date: 09/26/2020    History of present illness Pt is a 59 y/o M who was admitted to [facility] with Bilateral UE and LE weakness secondary to Hyponatremia. PMH includes: migraines, seuzires, GERD, and HTN. Note: recent hospital stay at [facility] earlier this month (09/2020) d/t fall and dizziness.   OT comments  Pt seen for OT tx this date to f/u re: safety with ADLs/ADL mobility. OT facilitates pt participation in sit to stand with CGA from bed and MIN A from lower toilet surface with use of RW and grab bars. Pt requires MIN/MOD A with LB dressing to bend and pull socks up and MIN A for dynamic standing balance while performing posterior peri care. For fxl mobility to/from restroom, pt somewhat tremulous, but completes safely with CGA and RW for balance. Pt also completes fxl mobility around nursing unit to simulate IADL distances such as into/out of MD appts. Pt performs standing ADLs sink-side with SUPV/CGA for balance with RW to wash hands and brush teeth as well as MIN verbal cues to sequence. OT provides education re: importance of oral hygiene as pt reports this is the first time he's brushed his teeth in "at least a year". Pt assisted back to bed, all needs met and in reach. Expresses moderate reception of education provided. Will required f/u as well as continued skilled OT to improve fine motor strength as it pertains to performing ADLs. Continue to recommend SNF as safest d/c recommendation, but if pt is to d/c home, he will require HHOT.    Follow Up Recommendations  SNF    Equipment Recommendations  3 in 1 bedside commode;Tub/shower seat    Recommendations for Other Services      Precautions / Restrictions Precautions Precautions: Fall Restrictions Weight Bearing Restrictions: No       Mobility Bed Mobility Overal bed mobility: Modified Independent                 Transfers Overall transfer level: Needs assistance Equipment used: Rolling walker (2 wheeled) Transfers: Sit to/from Stand Sit to Stand: Min guard;Min assist         General transfer comment: CGA from EOB, MIN A from commode    Balance Overall balance assessment: Needs assistance Sitting-balance support: Feet supported Sitting balance-Leahy Scale: Good     Standing balance support: Bilateral upper extremity supported Standing balance-Leahy Scale: Fair Standing balance comment: requires UE support                           ADL either performed or assessed with clinical judgement   ADL Overall ADL's : Needs assistance/impaired Eating/Feeding: Minimal assistance;Bed level Eating/Feeding Details (indicate cue type and reason): HOB elevated, MIN A to perform hand to mouth with full cup of water. Able to perform with setup if less water in the cup, but difficulty manipulating cup/lid himself. Grooming: Standing;Cueing for sequencing;Wash/dry hands;Oral care;Min guard Grooming Details (indicate cue type and reason): to wash hands and brush teeth standing sink-side with RW for UE support.             Lower Body Dressing: Minimal assistance;Moderate assistance;Sitting/lateral leans   Toilet Transfer: Minimal assistance;Grab bars;RW   Toileting- Clothing Manipulation and Hygiene: Minimal assistance Toileting - Clothing Manipulation Details (indicate cue type and reason): MIN A for standing balance with use of grab bar while performing posterior peri care  Functional mobility during ADLs: Min guard;Rolling walker (around unit with RW with MIN cues to negotiate obstacles/cues for safety.)       Vision Baseline Vision/History: Wears glasses Wears Glasses: At all times Patient Visual Report: No change from baseline     Perception     Praxis      Cognition Arousal/Alertness: Awake/alert Behavior During Therapy: WFL for tasks assessed/performed Overall  Cognitive Status: Within Functional Limits for tasks assessed                                 General Comments: Tremuluous intermittently throughout session, but endorses this has been present since childhood. A&Ox4 and able to follow 1-2 step commands.        Exercises Other Exercises Other Exercises: OT facilitates ed re: safety with fxl mobility and safe use of RW including safe hand placement for sit<>Stand, importance of oral hygiene as pt endorses he hasn't brushed his teeth nearly this entire pandemic because he "lost interest". Pt with moderate understanding.   Shoulder Instructions       General Comments      Pertinent Vitals/ Pain       Pain Assessment: No/denies pain  Home Living                                          Prior Functioning/Environment              Frequency  Min 1X/week        Progress Toward Goals  OT Goals(current goals can now be found in the care plan section)  Progress towards OT goals: Progressing toward goals  Acute Rehab OT Goals Patient Stated Goal: To regain strength OT Goal Formulation: With patient Time For Goal Achievement: 10/03/20 Potential to Achieve Goals: Good  Plan Discharge plan remains appropriate    Co-evaluation                 AM-PAC OT "6 Clicks" Daily Activity     Outcome Measure   Help from another person eating meals?: A Little Help from another person taking care of personal grooming?: A Little Help from another person toileting, which includes using toliet, bedpan, or urinal?: A Little Help from another person bathing (including washing, rinsing, drying)?: A Little Help from another person to put on and taking off regular upper body clothing?: A Little Help from another person to put on and taking off regular lower body clothing?: A Lot 6 Click Score: 17    End of Session Equipment Utilized During Treatment: Gait belt;Rolling walker  OT Visit Diagnosis:  Unsteadiness on feet (R26.81);Muscle weakness (generalized) (M62.81)   Activity Tolerance Patient tolerated treatment well   Patient Left in bed;with call bell/phone within reach;with bed alarm set   Nurse Communication Mobility status        Time: 1884-1660 OT Time Calculation (min): 47 min  Charges: OT General Charges $OT Visit: 1 Visit OT Treatments $Self Care/Home Management : 23-37 mins $Therapeutic Activity: 8-22 mins    Gerrianne Scale, MS, OTR/L ascom [phone] 09/26/20, 4:05 PM

## 2020-09-27 DIAGNOSIS — E871 Hypo-osmolality and hyponatremia: Secondary | ICD-10-CM | POA: Diagnosis not present

## 2020-09-27 DIAGNOSIS — I10 Essential (primary) hypertension: Secondary | ICD-10-CM | POA: Diagnosis not present

## 2020-09-27 LAB — BASIC METABOLIC PANEL
Anion gap: 7 (ref 5–15)
BUN: 17 mg/dL (ref 8–23)
CO2: 28 mmol/L (ref 22–32)
Calcium: 9 mg/dL (ref 8.9–10.3)
Chloride: 97 mmol/L — ABNORMAL LOW (ref 98–111)
Creatinine, Ser: 0.97 mg/dL (ref 0.61–1.24)
GFR, Estimated: >60 mL/min (ref >60)
Glucose, Bld: 84 mg/dL (ref 70–99)
Potassium: 4.6 mmol/L (ref 3.5–5.1)
Sodium: 132 mmol/L — ABNORMAL LOW (ref 135–145)

## 2020-09-27 LAB — CBC
HCT: 27.7 % — ABNORMAL LOW (ref 39.0–52.0)
Hemoglobin: 9.7 g/dL — ABNORMAL LOW (ref 13.0–17.0)
MCH: 33.9 pg (ref 26.0–34.0)
MCHC: 35 g/dL (ref 30.0–36.0)
MCV: 96.9 fL (ref 80.0–100.0)
Platelets: 210 10*3/uL (ref 150–400)
RBC: 2.86 MIL/uL — ABNORMAL LOW (ref 4.22–5.81)
RDW: 13.6 % (ref 11.5–15.5)
WBC: 4.3 10*3/uL (ref 4.0–10.5)
nRBC: 0 % (ref 0.0–0.2)

## 2020-09-27 LAB — MAGNESIUM: Magnesium: 2.1 mg/dL (ref 1.7–2.4)

## 2020-09-27 MED ORDER — METOPROLOL TARTRATE 5 MG/5ML IV SOLN
10.0000 mg | INTRAVENOUS | Status: AC
  Administered 2020-09-27: 10 mg via INTRAVENOUS

## 2020-09-27 MED ORDER — CARBAMAZEPINE 200 MG PO TABS
400.0000 mg | ORAL_TABLET | Freq: Two times a day (BID) | ORAL | Status: DC
  Administered 2020-09-27 – 2020-09-28 (×4): 400 mg via ORAL
  Filled 2020-09-27 (×5): qty 2

## 2020-09-27 MED ORDER — LORAZEPAM 2 MG/ML IJ SOLN
2.0000 mg | INTRAMUSCULAR | Status: AC
  Administered 2020-09-27: 2 mg via INTRAVENOUS

## 2020-09-27 MED ORDER — SODIUM CHLORIDE 0.9% FLUSH
3.0000 mL | Freq: Two times a day (BID) | INTRAVENOUS | Status: DC
  Administered 2020-09-27 – 2020-10-01 (×8): 3 mL via INTRAVENOUS

## 2020-09-27 MED ORDER — SODIUM CHLORIDE 0.9 % IV SOLN: INTRAVENOUS | Status: DC

## 2020-09-27 MED ORDER — METOPROLOL TARTRATE 5 MG/5ML IV SOLN
INTRAVENOUS | Status: AC
  Filled 2020-09-27: qty 5

## 2020-09-27 MED ORDER — LORAZEPAM 2 MG/ML IJ SOLN
INTRAMUSCULAR | Status: AC
  Filled 2020-09-27: qty 1

## 2020-09-27 NOTE — Significant Event (Signed)
Rapid Response Event Note   Reason for Call :  Patient had 64 second seizure witnessed by PT that resolved on its own but after seizure heart rate was 170-180s SVT. Patient with history of seizures but seizure medication had been stopped due to hyponatremia. Patient with potassium of 4.6 this morning.  Initial Focused Assessment:  Rapid response RN arrived in patient's room with patient lying in bed on his side with several 2A RNs at bedside including his primary nurse. Patient was being placed on 2 L nasal cannula and was already connected to the zoll on the code cart. Initial vitals signs upon arrival showed HR 187, BP 134/79, respirations unlabored and O2 saturations 92 % on 2 L nasal cannula. Patient very lethargic and appeared to be in post ictal state but also appeared to be protecting his airway.   Interventions:  Patient not able to follow commands enough to perform vagal maneuver. Dr. Jimmye Norman ordered 2 mg of ativan IV. Once given patient had mild improvement in rate (down to 150s-160s) and patient did have moments were his rate would drop to the 120s and even the 90s briefly but would not sustain. Then 5 mg of metoprolol IV given per Dr. Jimmye Norman and patient heart rate improved to 70s and 80s. Rhythm appeared to be primarily afib but there were occasional p waves seen. BP checked before and after ativan and metoprolol administration. Last BP before rapid response left was 107/64, MAP 78. Dr. Jimmye Norman arrived shortly after metoprolol given to assess the patient in person.  Plan of Care:  Patient to remain on 2A for now, no plans for discharge today. Awaiting magnesium results for potential replacement by MD. MD to restart his anti seizure medication(s). Patient's RN instructed to recall rapid response team if needed.  Event Summary:   MD Notified: Dr. Jimmye Norman [id number] Call Time: 6384 Arrival Time: 6659 End Time: 9357  Audrey Thull, Jaynie Bream, RN

## 2020-09-27 NOTE — Progress Notes (Signed)
°  Chaplain On-Call responded to Rapid Response notification at 10:20.  Learned from [place] that the patient had a seizure which prompted the Rapid Response Team.  Patient is resting now and unavailable for Chaplain visit at present. Chaplain will refer to afternoon Chaplain for follow-up support if needed.  [place] Deiondra Denley M.Div., [facility]

## 2020-09-27 NOTE — Plan of Care (Signed)
  Problem: Education: Goal: Knowledge of General Education information will improve Description: Including pain rating scale, medication(s)/side effects and non-pharmacologic comfort measures Outcome: Progressing   Problem: Health Behavior/Discharge Planning: Goal: Ability to manage health-related needs will improve Outcome: Progressing   Problem: Clinical Measurements: Goal: Will remain free from infection Outcome: Progressing Goal: Diagnostic test results will improve Outcome: Progressing Goal: Cardiovascular complication will be avoided Outcome: Progressing   Problem: Nutrition: Goal: Adequate nutrition will be maintained Outcome: Progressing

## 2020-09-27 NOTE — Progress Notes (Signed)
[facility]  ROUNDING NOTE   Subjective:   Patient found sleeping in bed, he had a seizure episode this morning, appears post ictal.  He also received lorazepam 2mg  IV dose for the seizures.  Respirations even unlabored, on 2 L of O2 via nasal cannula.  Objective:  Vital signs in last 24 hours:  Temp:  [98 F (36.7 C)-98.7 F (37.1 C)] 98.6 F (37 C) (10/12 1127) Pulse Rate:  [59-187] 70 (10/12 1127) Resp:  [16-18] 16 (10/12 1127) BP: (102-134)/(42-79) 102/42 (10/12 1127) SpO2:  [92 %-100 %] 96 % (10/12 1127)  Weight change:  Filed Weights   09/24/20 0416 09/25/20 0425 09/26/20 0332  Weight: 54.7 kg 54.4 kg 59.2 kg    Intake/Output: I/O last 3 completed shifts: In: 700 [P.O.:700] Out: 4650 [Urine:4650]   Intake/Output this shift:  Total I/O In: 240 [P.O.:240] Out: 300 [Urine:300]  Physical Exam: General:  Sleeping in bed, postictal  Head: Normocephalic, atraumatic  Lungs:   Airway protected, Respirations even and unlabored, on 2 L  O2 via nasal cannula  Heart:  S1S2, regular  Abdomen:  Soft, nondistended  Extremities:  No peripheral edema.  Neurologic:  Sleeping, postictal  Skin: No acute lesions or rashes    Basic Metabolic Panel: Recent Labs  Lab 09/23/20 0230 09/23/20 0230 09/23/20 0557 09/24/20 0345 09/24/20 0345 09/25/20 0420 09/26/20 0426 09/27/20 0650 09/27/20 0651  NA 122*   < > 121* 124*  --  129* 128*  --  132*  K 5.0  --   --  3.7  --  4.1 4.2  --  4.6  CL 88*  --   --  97*  --  98 98  --  97*  CO2 24  --   --  24  --  24 25  --  28  GLUCOSE 105*  --   --  96  --  85 83  --  84  BUN 22  --   --  11  --  10 13  --  17  CREATININE 1.03  --   --  0.90  --  0.74 0.85  --  0.97  CALCIUM 9.1   < >  --  7.4*   < > 7.6* 7.7*  --  9.0  MG  --   --   --   --   --   --   --  2.1  --    < > = values in this interval not displayed.    Liver Function Tests: Recent Labs  Lab 09/23/20 0230  AST 29  ALT 20  ALKPHOS 30*  BILITOT 0.9   PROT 6.8  ALBUMIN 3.6   No results for input(s): LIPASE, AMYLASE in the last 168 hours. No results for input(s): AMMONIA in the last 168 hours.  CBC: Recent Labs  Lab 09/23/20 0230 09/24/20 0345 09/25/20 0420 09/26/20 0426 09/27/20 0651  WBC 4.6 3.8* 3.6* 3.7* 4.3  NEUTROABS 3.3  --   --   --   --   HGB 11.5* 8.8* 8.8* 8.6* 9.7*  HCT 32.5* 24.7* 25.2* 25.0* 27.7*  MCV 94.8 93.9 96.9 96.9 96.9  PLT 146* 134* 151 169 210    Cardiac Enzymes: No results for input(s): CKTOTAL, CKMB, CKMBINDEX, TROPONINI in the last 168 hours.  BNP: Invalid input(s): POCBNP  CBG: No results for input(s): GLUCAP in the last 168 hours.  Microbiology: Results for orders placed or performed during the hospital encounter of 09/23/20  Respiratory Panel by RT PCR (Flu A&B, Covid) - Nasopharyngeal Swab     Status: None   Collection Time: 09/23/20  5:57 AM   Specimen: Nasopharyngeal Swab  Result Value Ref Range Status   SARS Coronavirus 2 by RT PCR NEGATIVE NEGATIVE Final    Comment: (NOTE) SARS-CoV-2 target nucleic acids are NOT DETECTED.  The SARS-CoV-2 RNA is generally detectable in upper respiratoy specimens during the acute phase of infection. The lowest concentration of SARS-CoV-2 viral copies this assay can detect is 131 copies/mL. A negative result does not preclude SARS-Cov-2 infection and should not be used as the sole basis for treatment or other patient management decisions. A negative result may occur with  improper specimen collection/handling, submission of specimen other than nasopharyngeal swab, presence of viral mutation(s) within the areas targeted by this assay, and inadequate number of viral copies (<131 copies/mL). A negative result must be combined with clinical observations, patient history, and epidemiological information. The expected result is Negative.  Fact Sheet for Patients:  PinkCheek.be  Fact Sheet for Healthcare Providers:   GravelBags.it  This test is no t yet approved or cleared by the [place] FDA and  has been authorized for detection and/or diagnosis of SARS-CoV-2 by FDA under an Emergency Use Authorization (EUA). This EUA will remain  in effect (meaning this test can be used) for the duration of the COVID-19 declaration under Section 564(b)(1) of the Act, 21 U.S.C. section 360bbb-3(b)(1), unless the authorization is terminated or revoked sooner.     Influenza A by PCR NEGATIVE NEGATIVE Final   Influenza B by PCR NEGATIVE NEGATIVE Final    Comment: (NOTE) The Xpert Xpress SARS-CoV-2/FLU/RSV assay is intended as an aid in  the diagnosis of influenza from Nasopharyngeal swab specimens and  should not be used as a sole basis for treatment. Nasal washings and  aspirates are unacceptable for Xpert Xpress SARS-CoV-2/FLU/RSV  testing.  Fact Sheet for Patients: PinkCheek.be  Fact Sheet for Healthcare Providers: GravelBags.it  This test is not yet approved or cleared by the [place] FDA and  has been authorized for detection and/or diagnosis of SARS-CoV-2 by  FDA under an Emergency Use Authorization (EUA). This EUA will remain  in effect (meaning this test can be used) for the duration of the  Covid-19 declaration under Section 564(b)(1) of the Act, 21  U.S.C. section 360bbb-3(b)(1), unless the authorization is  terminated or revoked. Performed at [facility], [address] [place], [place] [address]     Coagulation Studies: No results for input(s): LABPROT, INR in the last 72 hours.  Urinalysis: No results for input(s): COLORURINE, LABSPEC, PHURINE, GLUCOSEU, HGBUR, BILIRUBINUR, KETONESUR, PROTEINUR, UROBILINOGEN, NITRITE, LEUKOCYTESUR in the last 72 hours.  Invalid input(s): APPERANCEUR    Imaging: No results found.   Medications:    . atorvastatin  80 mg Oral Daily  .  carbamazepine  400 mg Oral BID  . divalproex  750 mg Oral Q12H  . enoxaparin (LOVENOX) injection  40 mg Subcutaneous Q24H  . sodium chloride  2 g Oral TID WC  . tamsulosin  0.4 mg Oral Daily   acetaminophen **OR** acetaminophen, ondansetron **OR** ondansetron (ZOFRAN) IV  Assessment/ Plan:  [name] is a 59 y.o. white male with seizures, hypertension, anemia, GERD who was admitted to [facility] on 09/23/2020 for Hyponatremia [E87.1]  1. Hyponatremia  Na+ improved to 132 today Encourage free fluid restriction We will continue monitoring  2. Hypertension:  Antihypertensives currently on hold Blood pressure  readings remains within normal range  3.  Seizure disorder Patient had a seizure episode today for about 45 seconds Treated with lorazepam 2 mg IV Plan to restart Tegretol which was on hold in concerns of hyponatremia Continue Depakote   LOS: 4 Donnis Phaneuf 10/12/202112:12 PM

## 2020-09-27 NOTE — Progress Notes (Addendum)
This RN called by PT stating pt is having a clonic tonic seizure that lasted about 45 sec. When arrived to patient's room he was no longer having a seizure. HR in the 180's sustaining, SVT. BP 134/79. SPO 92% on R/A. RRT called. Pt w/ hx of seizure, however carbamazepine being held due to hyponatremia. Dr. Jimmye Norman paged. Verbals orders received for IV ativan and IV metoprolol. Will give and continue to monitor.

## 2020-09-27 NOTE — Progress Notes (Addendum)
   09/27/20 1021  Assess: MEWS Score  BP 134/79  Pulse Rate (!) 187  SpO2 92 %  Assess: MEWS Score  MEWS Temp 0  MEWS Systolic 0  MEWS Pulse 3  MEWS RR 0  MEWS LOC 0  MEWS Score 3  MEWS Score Color Yellow  Assess: if the MEWS score is Yellow or Red  Were vital signs taken at a resting state? Yes  Focused Assessment Change from prior assessment (see assessment flowsheet)  Early Detection of Sepsis Score *See Row Information* Low  MEWS guidelines implemented *See Row Information* Yes  Treat  MEWS Interventions Escalated (See documentation below);Administered scheduled meds/treatments  Escalate  MEWS: Escalate Yellow: discuss with charge nurse/RN and consider discussing with provider and RRT  Notify: Charge Nurse/RN  Name of Charge Nurse/RN Notified Tammy Todd  Date Charge Nurse/RN Notified 09/27/20  Time Charge Nurse/RN Notified 1418  Notify: Provider  Provider Name/Title Dr. Jimmye Norman  Date Provider Notified 09/27/20  Time Provider Notified 1021  Notification Type Face-to-face  Notification Reason Change in status  Response See new orders  Date of Provider Response 09/27/20  Time of Provider Response 1021  Notify: Rapid Response  Name of Rapid Response RN Notified Judson Roch Icu rn  Date Rapid Response Notified 09/27/20  Time Rapid Response Notified 9672  Document  Patient Outcome Stabilized after interventions  Progress note created (see row info) Yes

## 2020-09-27 NOTE — Progress Notes (Signed)
Rapid response RN went to check back on patient after her rapid response earlier today. Magnesium within normal limits, HR was 62 and appeared to be SR at this time. No other tachycardic events noted in the vital signs flowsheet since rapid response earlier. Patient appeared to be resting comfortable in bed. Spoke briefly with patient's nurse who stated no further issues since previous event and no need for rapid response RN at this time.

## 2020-09-27 NOTE — Progress Notes (Signed)
OT Cancellation Note  Patient Details Name: [name] MRN: [id number] DOB: [birth year]   Cancelled Treatment:    Reason Eval/Treat Not Completed: Other (comment). Pt noted with seizure today, rapid response called. Will hold OT tx today and re-attempt at later date/time as medically appropriate.   Jeni Salles, MPH, MS, OTR/L ascom [phone] 09/27/20, 3:06 PM

## 2020-09-27 NOTE — Progress Notes (Signed)
PT Cancellation Note  Patient Details Name: [name] MRN: [id number] DOB: 08/08/1961   Cancelled Treatment:    Reason Eval/Treat Not Completed: Other (comment). PT entered room, pt recognized therapy and asked to go for a walk. Prior to mobility pt experienced seizure, RN notified. Pt experienced gasping and change in respiratory rate, rapid response initiated, team at bedside to evaluate patient. PT to re-attempt as able when patient is medically appropriate.   Lieutenant Diego PT, DPT 10:46 AM,09/27/20

## 2020-09-27 NOTE — Progress Notes (Signed)
PROGRESS NOTE    [name]  DDU:[id number] DOB: 1961/05/19 DOA: 09/23/2020 PCP: [name], MD  Assessment & Plan:   Principal Problem:   Chronic hyponatremia Active Problems:   Essential hypertension   Generalized weakness   CKD (chronic kidney disease) stage 2, GFR 60-89 ml/min   Acute hyponatremia   Chronic anemia   Seizure disorder (HCC) Seizure disorder: will continue on home dose of depakote. Carbamazepine was held yesterday as it can cause hyponatremia but pt had a seizure today while working with therapy. IV ativan 2mg  x 1 and IV lopressor 5mg  x 1 given. Home dose of carbamazepine restarted today   Acute on chronic hyponatremia: w/ hx of hyperosmolality. Na is labile but currently 132. Continue on NaCl tabs to 2 gm and fluid restrict to 1200 ml as per nephro   Generalized weakness: likely secondary to hyponatremia. Will go home w/ home health   HTN: continue to hold home dose of lisinopril, propanolol as per nephro   BPH: will continue on home dose of tamsulosin   HLD: continue on statin   CKDII: stable.   Normocytic anemia: no need for a transfusion at this time. Will continue to monitor   Thrombocytopenia: resolved     DVT prophylaxis: lovenox  Code Status:  Full  Family Communication:  Disposition Plan: likely d/c home health   Status is: Inpatient  Remains inpatient appropriate because:Persistent severe electrolyte disturbances and IV treatments appropriate due to intensity of illness or inability to take PO, can d/c home tomorrow if no more seizures & Na level is stable   Dispo: The patient is from: Home              Anticipated d/c is to: home health               Anticipated d/c date is:  1              Patient currently is not medically stable to d/c.    Consultants:   nephro    Procedures:    Antimicrobials:    Subjective: Pt c/o weakness  Objective: Vitals:   09/27/20 1021 09/27/20 1032 09/27/20 1032 09/27/20 1127   BP: 134/79 107/64 107/64 (!) 102/42  Pulse: (!) 187 77 77 70  Resp:    16  Temp:    98.6 F (37 C)  TempSrc:    Oral  SpO2: 92% 95% 99% 96%  Weight:      Height:        Intake/Output Summary (Last 24 hours) at 09/27/2020 1249 Last data filed at 09/27/2020 0940 Gross per 24 hour  Intake 580 ml  Output 2475 ml  Net -1895 ml   Filed Weights   09/24/20 0416 09/25/20 0425 09/26/20 0332  Weight: 54.7 kg 54.4 kg 59.2 kg    Examination:  General exam: Appears calm and comfortable. Appears older than stated age. Disheveled  Respiratory system: clear breath sounds b/l. No rales.  Cardiovascular system:  S1/S2+. No rubs or gallops  Gastrointestinal system: Abdomen is nondistended, soft and nontender. Normal bowel sounds heard. Central nervous system: Alert and oriented. Moves all 4 extremities  Psychiatry: Judgement and insight appear normal. Normal mood and affect     Data Reviewed: I have personally reviewed following labs and imaging studies  CBC: Recent Labs  Lab 09/23/20 0230 09/24/20 0345 09/25/20 0420 09/26/20 0426 09/27/20 0651  WBC 4.6 3.8* 3.6* 3.7* 4.3  NEUTROABS 3.3  --   --   --   --  HGB 11.5* 8.8* 8.8* 8.6* 9.7*  HCT 32.5* 24.7* 25.2* 25.0* 27.7*  MCV 94.8 93.9 96.9 96.9 96.9  PLT 146* 134* 151 169 517   Basic Metabolic Panel: Recent Labs  Lab 09/23/20 0230 09/23/20 0230 09/23/20 0557 09/24/20 0345 09/25/20 0420 09/26/20 0426 09/27/20 0650 09/27/20 0651  NA 122*   < > 121* 124* 129* 128*  --  132*  K 5.0  --   --  3.7 4.1 4.2  --  4.6  CL 88*  --   --  97* 98 98  --  97*  CO2 24  --   --  24 24 25   --  28  GLUCOSE 105*  --   --  96 85 83  --  84  BUN 22  --   --  11 10 13   --  17  CREATININE 1.03  --   --  0.90 0.74 0.85  --  0.97  CALCIUM 9.1  --   --  7.4* 7.6* 7.7*  --  9.0  MG  --   --   --   --   --   --  2.1  --    < > = values in this interval not displayed.   GFR: Estimated Creatinine Clearance: 64.4 mL/min (by C-G formula based  on SCr of 0.97 mg/dL). Liver Function Tests: Recent Labs  Lab 09/23/20 0230  AST 29  ALT 20  ALKPHOS 30*  BILITOT 0.9  PROT 6.8  ALBUMIN 3.6   No results for input(s): LIPASE, AMYLASE in the last 168 hours. No results for input(s): AMMONIA in the last 168 hours. Coagulation Profile: No results for input(s): INR, PROTIME in the last 168 hours. Cardiac Enzymes: No results for input(s): CKTOTAL, CKMB, CKMBINDEX, TROPONINI in the last 168 hours. BNP (last 3 results) No results for input(s): PROBNP in the last 8760 hours. HbA1C: No results for input(s): HGBA1C in the last 72 hours. CBG: No results for input(s): GLUCAP in the last 168 hours. Lipid Profile: No results for input(s): CHOL, HDL, LDLCALC, TRIG, CHOLHDL, LDLDIRECT in the last 72 hours. Thyroid Function Tests: No results for input(s): TSH, T4TOTAL, FREET4, T3FREE, THYROIDAB in the last 72 hours. Anemia Panel: No results for input(s): VITAMINB12, FOLATE, FERRITIN, TIBC, IRON, RETICCTPCT in the last 72 hours. Sepsis Labs: No results for input(s): PROCALCITON, LATICACIDVEN in the last 168 hours.  Recent Results (from the past 240 hour(s))  Respiratory Panel by RT PCR (Flu A&B, Covid) - Nasopharyngeal Swab     Status: None   Collection Time: 09/23/20  5:57 AM   Specimen: Nasopharyngeal Swab  Result Value Ref Range Status   SARS Coronavirus 2 by RT PCR NEGATIVE NEGATIVE Final    Comment: (NOTE) SARS-CoV-2 target nucleic acids are NOT DETECTED.  The SARS-CoV-2 RNA is generally detectable in upper respiratoy specimens during the acute phase of infection. The lowest concentration of SARS-CoV-2 viral copies this assay can detect is 131 copies/mL. A negative result does not preclude SARS-Cov-2 infection and should not be used as the sole basis for treatment or other patient management decisions. A negative result may occur with  improper specimen collection/handling, submission of specimen other than nasopharyngeal swab,  presence of viral mutation(s) within the areas targeted by this assay, and inadequate number of viral copies (<131 copies/mL). A negative result must be combined with clinical observations, patient history, and epidemiological information. The expected result is Negative.  Fact Sheet for Patients:  PinkCheek.be  Fact Sheet for  Healthcare Providers:  GravelBags.it  This test is no t yet approved or cleared by the [place] and  has been authorized for detection and/or diagnosis of SARS-CoV-2 by FDA under an Emergency Use Authorization (EUA). This EUA will remain  in effect (meaning this test can be used) for the duration of the COVID-19 declaration under Section 564(b)(1) of the Act, 21 U.S.C. section 360bbb-3(b)(1), unless the authorization is terminated or revoked sooner.     Influenza A by PCR NEGATIVE NEGATIVE Final   Influenza B by PCR NEGATIVE NEGATIVE Final    Comment: (NOTE) The Xpert Xpress SARS-CoV-2/FLU/RSV assay is intended as an aid in  the diagnosis of influenza from Nasopharyngeal swab specimens and  should not be used as a sole basis for treatment. Nasal washings and  aspirates are unacceptable for Xpert Xpress SARS-CoV-2/FLU/RSV  testing.  Fact Sheet for Patients: PinkCheek.be  Fact Sheet for Healthcare Providers: GravelBags.it  This test is not yet approved or cleared by the [place] FDA and  has been authorized for detection and/or diagnosis of SARS-CoV-2 by  FDA under an Emergency Use Authorization (EUA). This EUA will remain  in effect (meaning this test can be used) for the duration of the  Covid-19 declaration under Section 564(b)(1) of the Act, 21  U.S.C. section 360bbb-3(b)(1), unless the authorization is  terminated or revoked. Performed at [facility], [address], [place], [place] [address]           Radiology Studies: No results found.      Scheduled Meds: . atorvastatin  80 mg Oral Daily  . carbamazepine  400 mg Oral BID  . divalproex  750 mg Oral Q12H  . enoxaparin (LOVENOX) injection  40 mg Subcutaneous Q24H  . sodium chloride  2 g Oral TID WC  . tamsulosin  0.4 mg Oral Daily   Continuous Infusions:    LOS: 4 days    Time spent: 33 mins    Wyvonnia Dusky, MD [facility] Pager [phone]  If 7PM-7AM, please contact night-coverage [email or web address] 09/27/2020, 12:49 PM

## 2020-09-28 DIAGNOSIS — R569 Unspecified convulsions: Secondary | ICD-10-CM | POA: Diagnosis not present

## 2020-09-28 DIAGNOSIS — E871 Hypo-osmolality and hyponatremia: Secondary | ICD-10-CM | POA: Diagnosis not present

## 2020-09-28 LAB — BASIC METABOLIC PANEL
Anion gap: 9 (ref 5–15)
BUN: 25 mg/dL — ABNORMAL HIGH (ref 8–23)
CO2: 26 mmol/L (ref 22–32)
Calcium: 8.9 mg/dL (ref 8.9–10.3)
Chloride: 98 mmol/L (ref 98–111)
Creatinine, Ser: 1.18 mg/dL (ref 0.61–1.24)
GFR, Estimated: >60 mL/min (ref >60)
Glucose, Bld: 92 mg/dL (ref 70–99)
Potassium: 4.6 mmol/L (ref 3.5–5.1)
Sodium: 133 mmol/L — ABNORMAL LOW (ref 135–145)

## 2020-09-28 LAB — CBC
HCT: 27 % — ABNORMAL LOW (ref 39.0–52.0)
Hemoglobin: 9.3 g/dL — ABNORMAL LOW (ref 13.0–17.0)
MCH: 33.3 pg (ref 26.0–34.0)
MCHC: 34.4 g/dL (ref 30.0–36.0)
MCV: 96.8 fL (ref 80.0–100.0)
Platelets: 242 10*3/uL (ref 150–400)
RBC: 2.79 MIL/uL — ABNORMAL LOW (ref 4.22–5.81)
RDW: 13.8 % (ref 11.5–15.5)
WBC: 4.4 10*3/uL (ref 4.0–10.5)
nRBC: 0 % (ref 0.0–0.2)

## 2020-09-28 MED ORDER — LEVETIRACETAM IN NACL 1000 MG/100ML IV SOLN
1000.0000 mg | Freq: Once | INTRAVENOUS | Status: AC
  Administered 2020-09-28: 1000 mg via INTRAVENOUS
  Filled 2020-09-28: qty 100

## 2020-09-28 MED ORDER — LEVETIRACETAM 500 MG PO TABS
500.0000 mg | ORAL_TABLET | Freq: Two times a day (BID) | ORAL | Status: DC
  Administered 2020-09-28 – 2020-10-01 (×6): 500 mg via ORAL
  Filled 2020-09-28 (×13): qty 1

## 2020-09-28 MED ORDER — HEPARIN SODIUM (PORCINE) 5000 UNIT/ML IJ SOLN
5000.0000 [IU] | Freq: Three times a day (TID) | INTRAMUSCULAR | Status: DC
  Administered 2020-09-28 – 2020-09-30 (×8): 5000 [IU] via SUBCUTANEOUS
  Filled 2020-09-28 (×8): qty 1

## 2020-09-28 NOTE — Progress Notes (Signed)
Occupational Therapy Treatment Patient Details Name: [name] MRN: [id number] DOB: 05-28-1961 Today's Date: 09/28/2020    History of present illness Pt is a 59 y/o M who was admitted to [facility] with Bilateral UE and LE weakness secondary to Hyponatremia. PMH includes: migraines, seuzires, GERD, and HTN. Note: recent hospital stay at [facility] earlier this month (09/2020) d/t fall and dizziness.   OT comments  Pt supine in bed upon entering the room and is agreeable to OT intervention. Pt performs bed mobility at mod I level. Pt requesting assistance to go to bathroom and to attempt without use of RW. Pt standing with min guard and ambulating with min A into bathroom. Pt performs toilet transfer and hygiene with min A for standing balance. Pt ambulating to sink for hand hygiene and to brush teeth with multiple drops from hands secondary to B UE tremors. Pt reports he has these at baseline. Pt returning to sit on EOB and agreeable to B UE strengthening exercises with use of level 2 resisitve theraband. Pt performing 3 sets of 10 chest pulls, shoulder elevation, shoulder diagonals, and biceps curls with min cuing for proper technique. Pt requesting to return to bed secondary to fatigue. All need within reach and bed alarm activated. OT recommendation for SNF as pt lives alone and it would be unsafe for him to return at this level of care.    Follow Up Recommendations  SNF   Equipment Recommendations  Tub/shower seat;3 in 1 bedside commode       Precautions / Restrictions Precautions Precautions: Fall       Mobility Bed Mobility Overal bed mobility: Modified Independent     Transfers Overall transfer level: Needs assistance Equipment used: None Transfers: Sit to/from Stand Sit to Stand: Min assist       Balance Overall balance assessment: Needs assistance Sitting-balance support: Feet supported Sitting balance-Leahy Scale: Good Sitting balance - Comments: no LOB in sitting    Standing balance support: During functional activity;No upper extremity supported;Bilateral upper extremity supported Standing balance-Leahy Scale: Fair Standing balance comment: pt is very unsteady without use of RW. poor balance without BUE support but fair balance with.           ADL either performed or assessed with clinical judgement   ADL Overall ADL's : Needs assistance/impaired     Grooming: Wash/dry hands;Wash/dry face;Oral care;Min guard;Standing      Toilet Transfer: Minimal assistance;Grab bars   Toileting- Clothing Manipulation and Hygiene: Minimal assistance;Sit to/from stand         General ADL Comments: Min A for ambulation without use of AD. Pt noted to have B tremors in UEs with fine motor tasks but able to complete with more time.     Vision Baseline Vision/History: Wears glasses Wears Glasses: At all times            Cognition Arousal/Alertness: Awake/alert Behavior During Therapy: WFL for tasks assessed/performed Overall Cognitive Status: Within Functional Limits for tasks assessed                    Pertinent Vitals/ Pain       Pain Assessment: No/denies pain         Frequency  Min 1X/week        Progress Toward Goals  OT Goals(current goals can now be found in the care plan section)  Progress towards OT goals: Progressing toward goals  Acute Rehab OT Goals Patient Stated Goal: To home home. OT Goal Formulation: With  patient Time For Goal Achievement: 10/03/20 Potential to Achieve Goals: Good  Plan Discharge plan remains appropriate       AM-PAC OT "6 Clicks" Daily Activity     Outcome Measure   Help from another person eating meals?: A Little Help from another person taking care of personal grooming?: A Little Help from another person toileting, which includes using toliet, bedpan, or urinal?: A Little Help from another person bathing (including washing, rinsing, drying)?: A Little Help from another person to put on  and taking off regular upper body clothing?: A Little Help from another person to put on and taking off regular lower body clothing?: A Little 6 Click Score: 18    End of Session    OT Visit Diagnosis: Unsteadiness on feet (R26.81);Muscle weakness (generalized) (M62.81)   Activity Tolerance Patient tolerated treatment well   Patient Left in bed;with call bell/phone within reach;with bed alarm set   Nurse Communication Mobility status        Time: 4975-3005 OT Time Calculation (min): 27 min  Charges: OT General Charges $OT Visit: 1 Visit OT Treatments $Self Care/Home Management : 8-22 mins $Therapeutic Exercise: 8-22 mins  Darleen Crocker, MS, OTR/L , CBIS ascom [phone]  09/28/20, 4:30 PM

## 2020-09-28 NOTE — Progress Notes (Signed)
Physical Therapy Treatment Patient Details Name: [name] MRN: [id number] DOB: 09-24-1961 Today's Date: 09/28/2020    History of Present Illness Pt is a 59 y/o M who was admitted to [facility] with Bilateral UE and LE weakness secondary to Hyponatremia. PMH includes: migraines, seuzires, GERD, and HTN. Note: recent hospital stay at [facility] earlier this month (09/2020) d/t fall and dizziness.    PT Comments    Pt was long sitting in bed upon arriving. He is A and O x 4 and a good historian of past medical history. He did have seizure previous date that he says was from missed medication. Pt is very motivated and eager for OOB activity. Required no assistance to exit R side of bed. He also does not endorse pain or lightheadedness. Consistent tremors/ataxic movements while performing functional task. Requested to attempt ambulation without AD. He did ambulate 120 ft total with seated rest at 60 ft 2/2 to HR elevation to 146. He reports no symptoms or distress. Highly recommend use of DME/RW with all standing activity. He is very unsteady without BUE support. He does report owning a RW but only used it in some parts of his house.  Therapist discussed dc disposition with pt. Therapist feels pt has poor insight of his deficits with poor safety awareness. States," I don't think I'm going to drive anymore because of my vision." Discussed safety concerns with pt with [place] home. Pt does not want to go to SNF/rehab. Acute PT DC recommendation was updated to SNF however if pt is unwilling recommend continued skilled PT at home with [facility] to follow. Pt lives alone and [place] Curia has concerns with pt's abilities to safely perform all IADLs. He was in bed at conclusion of session with call bell in reach, and bed alarm in place.      Follow Up Recommendations  SNF;Home health PT;Supervision/Assistance - 24 hour;Supervision for mobility/OOB     Equipment Recommendations  Rolling walker with 5" wheels    Recommendations  for Other Services       Precautions / Restrictions Precautions Precautions: Fall Restrictions Weight Bearing Restrictions: No    Mobility  Bed Mobility Overal bed mobility: Modified Independent       Transfers Overall transfer level: Needs assistance Equipment used: None Transfers: Sit to/from Stand Sit to Stand: Min guard;Min assist         General transfer comment: pt requested to attempt session without use of RW. require min assist to prevent LOB upon standing without use of RW. CGA for safety with use.  Ambulation/Gait Ambulation/Gait assistance: Min assist Gait Distance (Feet): 120 Feet Assistive device: None Gait Pattern/deviations: Step-through pattern;Drifts right/left;Staggering left;Staggering right Gait velocity: WNL   General Gait Details: pt ambulated 120 ft total but required seated rest after 60 ft 2/2 to HR elevation to 146. after 2 minute seated rest, recovers to 108bpm. with final 60 ft HR elevtaed to 138bpm. pt is much safer with use of RW. educted pt on safety concerns with ambulation without AD. he states understanding but will need reinforcement in future sessions.       Balance Overall balance assessment: Needs assistance Sitting-balance support: Feet supported Sitting balance-Leahy Scale: Good Sitting balance - Comments: no LOB in sitting   Standing balance support: During functional activity;No upper extremity supported;Bilateral upper extremity supported Standing balance-Leahy Scale: Fair Standing balance comment: pt is very unsteady without use of RW. poor balance without BUE support but fair balance with.  Cognition Arousal/Alertness: Awake/alert Behavior During Therapy: WFL for tasks assessed/performed Overall Cognitive Status: Within Functional Limits for tasks assessed          General Comments: pt reports having baseline tremors. has difficulty holding drink and eat with spoon.             Pertinent Vitals/Pain  Pain Assessment: No/denies pain           PT Goals (current goals can now be found in the care plan section) Acute Rehab PT Goals Patient Stated Goal: To home home. Progress towards PT goals: Progressing toward goals    Frequency    Min 2X/week      PT Plan Discharge plan needs to be updated       AM-PAC PT "6 Clicks" Mobility   Outcome Measure  Help needed turning from your back to your side while in a flat bed without using bedrails?: None Help needed moving from lying on your back to sitting on the side of a flat bed without using bedrails?: None Help needed moving to and from a bed to a chair (including a wheelchair)?: A Little Help needed standing up from a chair using your arms (e.g., wheelchair or bedside chair)?: A Little Help needed to walk in hospital room?: A Little Help needed climbing 3-5 steps with a railing? : A Little 6 Click Score: 20    End of Session Equipment Utilized During Treatment: Gait belt Activity Tolerance: Patient tolerated treatment well Patient left: with call bell/phone within reach;in bed;with bed alarm set;with nursing/sitter in room Nurse Communication: Mobility status PT Visit Diagnosis: Unsteadiness on feet (R26.81);Other abnormalities of gait and mobility (R26.89);Repeated falls (R29.6);History of falling (Z91.81);Muscle weakness (generalized) (M62.81);Difficulty in walking, not elsewhere classified (R26.2)     Time: 9833-8250 PT Time Calculation (min) (ACUTE ONLY): 24 min  Charges:  $Gait Training: 8-22 mins $Therapeutic Activity: 8-22 mins                     Julaine Fusi PTA 09/28/20, 11:32 AM

## 2020-09-28 NOTE — Progress Notes (Signed)
PROGRESS NOTE    [name]  NGE:[id number] DOB: [birth year] DOA: 09/23/2020 PCP: [name], MD  Assessment & Plan:   Principal Problem:   Chronic hyponatremia Active Problems:   Essential hypertension   Generalized weakness   CKD (chronic kidney disease) stage 2, GFR 60-89 ml/min   Acute hyponatremia   Chronic anemia   Seizure disorder ([place])   Seizure disorder:  --Pt has been taking both Tegretol and Depakote since at least 2016, and the effect of Tegretol on his hyponatremia was mentioned even back in 2016.  There had been discussion about switching pt's anti-epileptics, however, it didn't happen due to some insurance reasons. PLAN: --consult neuro today for rec on alternative anti-epileptics    Acute on chronic hyponatremia: w/ hx of hyperosmolality.  --thought to be worsened by Tegretol --nephrology consulted PLAN: --continue salt tablets --consult neuro today for rec on alternative anti-epileptics    Generalized weakness:  likely secondary to hyponatremia. --tx hyponatremia --Will go home w/ home health   HTN:  --continue to hold home Lisinopril and propanolol 2/2 low BP  BPH: will continue on home dose of tamsulosin   HLD: continue on statin   CKDII: stable.   Normocytic anemia: no need for a transfusion at this time. Will continue to monitor   Thrombocytopenia: resolved     DVT prophylaxis: lovenox  Code Status:  Full  Family Communication:  Disposition Plan: home w HH (pt refused SNF) Status is: Inpatient  Anticipated d/c date is: 1-2 days Patient currently is not medically stable to d/c.  Neurology consulted to transition pt to a different seizure med to avoid further hyponatremia (for which pt was hospitalized this time).    Consultants:   nephro    Procedures:    Antimicrobials:    Subjective: Pt had a seizure yesterday, so tegretol resumed.  Pt reported doing good today.  Pt reported being compliant with Tegretol  and Depakote at home, along with his salt tablets.    Consult neurology today for a different seizure med.    Objective: Vitals:   09/28/20 0414 09/28/20 0757 09/28/20 1132 09/28/20 1627  BP: (!) 98/56 107/71 115/66 94/70  Pulse: 62 69 78 71  Resp: 18     Temp: 98.8 F (37.1 C) 98 F (36.7 C) 98.3 F (36.8 C) 97.9 F (36.6 C)  TempSrc: Oral Oral Oral Oral  SpO2: 99% 100% 98% 97%  Weight: 55.4 kg     Height:        Intake/Output Summary (Last 24 hours) at 09/28/2020 1857 Last data filed at 09/28/2020 1800 Gross per 24 hour  Intake 240 ml  Output 2175 ml  Net -1935 ml   Filed Weights   09/25/20 0425 09/26/20 0332 09/28/20 0414  Weight: 54.4 kg 59.2 kg 55.4 kg    Examination:  Constitutional: NAD, AAOx3 HEENT: conjunctivae and lids normal, EOMI CV: No cyanosis.   RESP: normal respiratory effort, on RA Extremities: No effusions, edema in BLE SKIN: warm, dry and intact Neuro: II - XII grossly intact.   Psych: Normal mood and affect.  Appropriate judgement and reason    Data Reviewed: I have personally reviewed following labs and imaging studies  CBC: Recent Labs  Lab 09/23/20 0230 09/23/20 0230 09/24/20 0345 09/25/20 0420 09/26/20 0426 09/27/20 0651 09/28/20 0354  WBC 4.6   < > 3.8* 3.6* 3.7* 4.3 4.4  NEUTROABS 3.3  --   --   --   --   --   --  HGB 11.5*   < > 8.8* 8.8* 8.6* 9.7* 9.3*  HCT 32.5*   < > 24.7* 25.2* 25.0* 27.7* 27.0*  MCV 94.8   < > 93.9 96.9 96.9 96.9 96.8  PLT 146*   < > 134* 151 169 210 242   < > = values in this interval not displayed.   Basic Metabolic Panel: Recent Labs  Lab 09/24/20 0345 09/25/20 0420 09/26/20 0426 09/27/20 0650 09/27/20 0651 09/28/20 0354  NA 124* 129* 128*  --  132* 133*  K 3.7 4.1 4.2  --  4.6 4.6  CL 97* 98 98  --  97* 98  CO2 24 24 25   --  28 26  GLUCOSE 96 85 83  --  84 92  BUN 11 10 13   --  17 25*  CREATININE 0.90 0.74 0.85  --  0.97 1.18  CALCIUM 7.4* 7.6* 7.7*  --  9.0 8.9  MG  --   --   --   2.1  --   --    GFR: Estimated Creatinine Clearance: 51.5 mL/min (by C-G formula based on SCr of 1.18 mg/dL). Liver Function Tests: Recent Labs  Lab 09/23/20 0230  AST 29  ALT 20  ALKPHOS 30*  BILITOT 0.9  PROT 6.8  ALBUMIN 3.6   No results for input(s): LIPASE, AMYLASE in the last 168 hours. No results for input(s): AMMONIA in the last 168 hours. Coagulation Profile: No results for input(s): INR, PROTIME in the last 168 hours. Cardiac Enzymes: No results for input(s): CKTOTAL, CKMB, CKMBINDEX, TROPONINI in the last 168 hours. BNP (last 3 results) No results for input(s): PROBNP in the last 8760 hours. HbA1C: No results for input(s): HGBA1C in the last 72 hours. CBG: No results for input(s): GLUCAP in the last 168 hours. Lipid Profile: No results for input(s): CHOL, HDL, LDLCALC, TRIG, CHOLHDL, LDLDIRECT in the last 72 hours. Thyroid Function Tests: No results for input(s): TSH, T4TOTAL, FREET4, T3FREE, THYROIDAB in the last 72 hours. Anemia Panel: No results for input(s): VITAMINB12, FOLATE, FERRITIN, TIBC, IRON, RETICCTPCT in the last 72 hours. Sepsis Labs: No results for input(s): PROCALCITON, LATICACIDVEN in the last 168 hours.  Recent Results (from the past 240 hour(s))  Respiratory Panel by RT PCR (Flu A&B, Covid) - Nasopharyngeal Swab     Status: None   Collection Time: 09/23/20  5:57 AM   Specimen: Nasopharyngeal Swab  Result Value Ref Range Status   SARS Coronavirus 2 by RT PCR NEGATIVE NEGATIVE Final    Comment: (NOTE) SARS-CoV-2 target nucleic acids are NOT DETECTED.  The SARS-CoV-2 RNA is generally detectable in upper respiratoy specimens during the acute phase of infection. The lowest concentration of SARS-CoV-2 viral copies this assay can detect is 131 copies/mL. A negative result does not preclude SARS-Cov-2 infection and should not be used as the sole basis for treatment or other patient management decisions. A negative result may occur with    improper specimen collection/handling, submission of specimen other than nasopharyngeal swab, presence of viral mutation(s) within the areas targeted by this assay, and inadequate number of viral copies (<131 copies/mL). A negative result must be combined with clinical observations, patient history, and epidemiological information. The expected result is Negative.  Fact Sheet for Patients:  PinkCheek.be  Fact Sheet for Healthcare Providers:  GravelBags.it  This test is no t yet approved or cleared by the [place] FDA and  has been authorized for detection and/or diagnosis of SARS-CoV-2 by FDA under an Emergency Use  Authorization (EUA). This EUA will remain  in effect (meaning this test can be used) for the duration of the COVID-19 declaration under Section 564(b)(1) of the Act, 21 U.S.C. section 360bbb-3(b)(1), unless the authorization is terminated or revoked sooner.     Influenza A by PCR NEGATIVE NEGATIVE Final   Influenza B by PCR NEGATIVE NEGATIVE Final    Comment: (NOTE) The Xpert Xpress SARS-CoV-2/FLU/RSV assay is intended as an aid in  the diagnosis of influenza from Nasopharyngeal swab specimens and  should not be used as a sole basis for treatment. Nasal washings and  aspirates are unacceptable for Xpert Xpress SARS-CoV-2/FLU/RSV  testing.  Fact Sheet for Patients: PinkCheek.be  Fact Sheet for Healthcare Providers: GravelBags.it  This test is not yet approved or cleared by the [place] FDA and  has been authorized for detection and/or diagnosis of SARS-CoV-2 by  FDA under an Emergency Use Authorization (EUA). This EUA will remain  in effect (meaning this test can be used) for the duration of the  Covid-19 declaration under Section 564(b)(1) of the Act, 21  U.S.C. section 360bbb-3(b)(1), unless the authorization is  terminated or  revoked. Performed at [facility], [address], [place], [place] [address]          Radiology Studies: No results found.      Scheduled Meds: . atorvastatin  80 mg Oral Daily  . carbamazepine  400 mg Oral BID  . divalproex  750 mg Oral Q12H  . heparin injection (subcutaneous)  5,000 Units Subcutaneous Q8H  . levETIRAcetam  500 mg Oral BID  . sodium chloride flush  3 mL Intravenous Q12H  . sodium chloride  2 g Oral TID WC  . tamsulosin  0.4 mg Oral Daily   Continuous Infusions:    LOS: 5 days     Enzo Bi, MD [facility] Pager [phone]  If 7PM-7AM, please contact night-coverage [email or web address] 09/28/2020, 6:57 PM

## 2020-09-28 NOTE — Consult Note (Signed)
Reason for Consult:Seizures Requesting Physician: Billie Ruddy  CC: Weakness  I have been asked by Dr. Billie Ruddy to see this patient in consultation for seizures.  HPI: [name] is an 59 y.o. male with a history of seizures on Depakote and Tegretol who presented with complaints of generalized weakness.  Patient found to be hyponatremic.  Tegretol was discontinued and patient had a breakthrough seizure.  Patient now back on Tegretol.  Patient has had intermittent issues with Tegretol in the past causing hyponatremia.  At one time was scheduled by his neurologist to be admitted for transition from Tegretol but reports that he was unable to afford it at that time and was started on salt tablets.  Has remained on this regimen.     Past Medical History:  Diagnosis Date  . Anemia   . GERD (gastroesophageal reflux disease)   . Hypertension   . Hyponatremia    chronic  . Kidney disease   . Migraine   . Seizures ([place])     Past Surgical History:  Procedure Laterality Date  . COLONOSCOPY WITH PROPOFOL N/A 05/01/2016   Procedure: COLONOSCOPY WITH PROPOFOL;  Surgeon: Lucilla Lame, MD;  Location: [facility] ENDOSCOPY;  Service: Endoscopy;  Laterality: N/A;  . ESOPHAGOGASTRODUODENOSCOPY (EGD) WITH PROPOFOL N/A 05/01/2016   Procedure: ESOPHAGOGASTRODUODENOSCOPY (EGD) WITH PROPOFOL;  Surgeon: Lucilla Lame, MD;  Location: [facility] ENDOSCOPY;  Service: Endoscopy;  Laterality: N/A;  . none      Family History  Problem Relation Age of Onset  . Heart attack Father   . Kidney disease Father     Social History:  reports that he has never smoked. He has never used smokeless tobacco. He reports that he does not drink alcohol and does not use drugs.  Allergies  Allergen Reactions  . Nsaids Other (See Comments)    Kidney disease    Medications:  I have reviewed the patient's current medications. Prior to Admission:  Medications Prior to Admission  Medication Sig Dispense Refill Last Dose  . alendronate (FOSAMAX) 70  MG tablet Take 70 mg by mouth once a week. Take with a full glass of water on an empty stomach.   Unknown at Unknown  . atorvastatin (LIPITOR) 80 MG tablet Take 80 mg by mouth daily.   09/22/2020 at Unknown  . carbamazepine (TEGRETOL) 200 MG tablet Take 200-400 mg by mouth See admin instructions. Take 2 tablets (400) mg by mouth every morning, Take 1 tablet by mouth at noon, and Take 2 tablets (400 mg) by mouth every night at bedtime.   09/22/2020 at Unknown  . divalproex (DEPAKOTE) 250 MG DR tablet Take 3 tablets (750 mg total) by mouth every 12 (twelve) hours. (Patient taking differently: Take 750 mg by mouth 2 (two) times daily. ) 100 tablet 1 09/22/2020 at Unknown time  . lisinopril (PRINIVIL,ZESTRIL) 5 MG tablet Take 5 mg by mouth at bedtime.   09/22/2020 at Unknown time  . propranolol (INDERAL) 20 MG tablet Take 20 mg by mouth 2 (two) times daily.   09/22/2020 at Unknown time  . sodium chloride 1 g tablet Take 1 tablet (1 g total) by mouth 3 (three) times daily with meals. 90 tablet 0 09/22/2020 at Unknown time  . tamsulosin (FLOMAX) 0.4 MG CAPS capsule Take 1 capsule (0.4 mg total) by mouth daily. (Patient taking differently: Take 0.4 mg by mouth at bedtime. ) 30 capsule 0 Unknown at Unknown  . feeding supplement, ENSURE ENLIVE, (ENSURE ENLIVE) LIQD Take 237 mLs by mouth 3 (three)  times daily between meals. 237 mL 12    Scheduled: . atorvastatin  80 mg Oral Daily  . carbamazepine  400 mg Oral BID  . divalproex  750 mg Oral Q12H  . heparin injection (subcutaneous)  5,000 Units Subcutaneous Q8H  . sodium chloride flush  3 mL Intravenous Q12H  . sodium chloride  2 g Oral TID WC  . tamsulosin  0.4 mg Oral Daily    ROS: History obtained from the patient  General ROS: negative for - chills, fatigue, fever, night sweats, weight gain or weight loss Psychological ROS: negative for - behavioral disorder, hallucinations, memory difficulties, mood swings or suicidal ideation Ophthalmic ROS: negative  for - blurry vision, double vision, eye pain or loss of vision ENT ROS: negative for - epistaxis, nasal discharge, oral lesions, sore throat, tinnitus or vertigo Allergy and Immunology ROS: negative for - hives or itchy/watery eyes Hematological and Lymphatic ROS: negative for - bleeding problems, bruising or swollen lymph nodes Endocrine ROS: negative for - galactorrhea, hair pattern changes, polydipsia/polyuria or temperature intolerance Respiratory ROS: negative for - cough, hemoptysis, shortness of breath or wheezing Cardiovascular ROS: negative for - chest pain, dyspnea on exertion, edema or irregular heartbeat Gastrointestinal ROS: negative for - abdominal pain, diarrhea, hematemesis, nausea/vomiting or stool incontinence Genito-Urinary ROS: negative for - dysuria, hematuria, incontinence or urinary frequency/urgency Musculoskeletal ROS: weakness Neurological ROS: as noted in HPI Dermatological ROS: negative for rash and skin lesion changes  Physical Examination: Blood pressure 107/71, pulse 69, temperature 98 F (36.7 C), temperature source Oral, resp. rate 18, height 5\' 3"  (1.6 m), weight 55.4 kg, SpO2 100 %.  HEENT-  Normocephalic, no lesions, without obvious abnormality.  Normal external eye and conjunctiva.  Normal TM's bilaterally.  Normal auditory canals and external ears. Normal external nose, mucus membranes and septum.  Normal pharynx. Cardiovascular- S1, S2 normal, pulses palpable throughout   Lungs- chest clear, no wheezing, rales, normal symmetric air entry Abdomen- soft, non-tender; bowel sounds normal; no masses,  no organomegaly Extremities- no edema Lymph-no adenopathy palpable Musculoskeletal-no joint tenderness, deformity or swelling Skin-warm and dry, no hyperpigmentation, vitiligo, or suspicious lesions  Neurological Examination   Mental Status: Alert, oriented, thought content appropriate.  Speech fluent without evidence of aphasia.  Able to follow 3 step  commands without difficulty. Cranial Nerves: II: Visual fields grossly normal, pupils equal, round, reactive to light and accommodation III,IV, VI: left ptosis, extra-ocular motions intact bilaterally V,VII: smile symmetric, facial light touch sensation normal bilaterally VIII: hearing normal bilaterally IX,X: gag reflex present XI: bilateral shoulder shrug XII: midline tongue extension Motor: Right : Upper extremity   5/5    Left:     Upper extremity   5/5  Lower extremity   5/5     Lower extremity   5/5 Tone and bulk:normal tone throughout; no atrophy noted Sensory: Pinprick and light touch intact throughout, bilaterally Deep Tendon Reflexes: Increased throughout with bilateral sustained ankle clonus Plantars: Right: mute   Left: mute Cerebellar: Tremor noted bilaterally with finger-to-nose testing.  Normal heel-to-shin testing bilaterally Gait: not tested due to safety concerns    Laboratory Studies:   Basic Metabolic Panel: Recent Labs  Lab 09/24/20 0345 09/24/20 0345 09/25/20 0420 09/25/20 0420 09/26/20 0426 09/27/20 0650 09/27/20 0651 09/28/20 0354  NA 124*  --  129*  --  128*  --  132* 133*  K 3.7  --  4.1  --  4.2  --  4.6 4.6  CL 97*  --  98  --  98  --  97* 98  CO2 24  --  24  --  25  --  28 26  GLUCOSE 96  --  85  --  83  --  84 92  BUN 11  --  10  --  13  --  17 25*  CREATININE 0.90  --  0.74  --  0.85  --  0.97 1.18  CALCIUM 7.4*   < > 7.6*   < > 7.7*  --  9.0 8.9  MG  --   --   --   --   --  2.1  --   --    < > = values in this interval not displayed.    Liver Function Tests: Recent Labs  Lab 09/23/20 0230  AST 29  ALT 20  ALKPHOS 30*  BILITOT 0.9  PROT 6.8  ALBUMIN 3.6   No results for input(s): LIPASE, AMYLASE in the last 168 hours. No results for input(s): AMMONIA in the last 168 hours.  CBC: Recent Labs  Lab 09/23/20 0230 09/23/20 0230 09/24/20 0345 09/25/20 0420 09/26/20 0426 09/27/20 0651 09/28/20 0354  WBC 4.6   < > 3.8* 3.6*  3.7* 4.3 4.4  NEUTROABS 3.3  --   --   --   --   --   --   HGB 11.5*   < > 8.8* 8.8* 8.6* 9.7* 9.3*  HCT 32.5*   < > 24.7* 25.2* 25.0* 27.7* 27.0*  MCV 94.8   < > 93.9 96.9 96.9 96.9 96.8  PLT 146*   < > 134* 151 169 210 242   < > = values in this interval not displayed.    Cardiac Enzymes: No results for input(s): CKTOTAL, CKMB, CKMBINDEX, TROPONINI in the last 168 hours.  BNP: Invalid input(s): POCBNP  CBG: No results for input(s): GLUCAP in the last 168 hours.  Microbiology: Results for orders placed or performed during the hospital encounter of 09/23/20  Respiratory Panel by RT PCR (Flu A&B, Covid) - Nasopharyngeal Swab     Status: None   Collection Time: 09/23/20  5:57 AM   Specimen: Nasopharyngeal Swab  Result Value Ref Range Status   SARS Coronavirus 2 by RT PCR NEGATIVE NEGATIVE Final    Comment: (NOTE) SARS-CoV-2 target nucleic acids are NOT DETECTED.  The SARS-CoV-2 RNA is generally detectable in upper respiratoy specimens during the acute phase of infection. The lowest concentration of SARS-CoV-2 viral copies this assay can detect is 131 copies/mL. A negative result does not preclude SARS-Cov-2 infection and should not be used as the sole basis for treatment or other patient management decisions. A negative result may occur with  improper specimen collection/handling, submission of specimen other than nasopharyngeal swab, presence of viral mutation(s) within the areas targeted by this assay, and inadequate number of viral copies (<131 copies/mL). A negative result must be combined with clinical observations, patient history, and epidemiological information. The expected result is Negative.  Fact Sheet for Patients:  PinkCheek.be  Fact Sheet for Healthcare Providers:  GravelBags.it  This test is no t yet approved or cleared by the [place] FDA and  has been authorized for detection and/or diagnosis  of SARS-CoV-2 by FDA under an Emergency Use Authorization (EUA). This EUA will remain  in effect (meaning this test can be used) for the duration of the COVID-19 declaration under Section 564(b)(1) of the Act, 21 U.S.C. section 360bbb-3(b)(1), unless the authorization is terminated or revoked sooner.  Influenza A by PCR NEGATIVE NEGATIVE Final   Influenza B by PCR NEGATIVE NEGATIVE Final    Comment: (NOTE) The Xpert Xpress SARS-CoV-2/FLU/RSV assay is intended as an aid in  the diagnosis of influenza from Nasopharyngeal swab specimens and  should not be used as a sole basis for treatment. Nasal washings and  aspirates are unacceptable for Xpert Xpress SARS-CoV-2/FLU/RSV  testing.  Fact Sheet for Patients: PinkCheek.be  Fact Sheet for Healthcare Providers: GravelBags.it  This test is not yet approved or cleared by the [place] FDA and  has been authorized for detection and/or diagnosis of SARS-CoV-2 by  FDA under an Emergency Use Authorization (EUA). This EUA will remain  in effect (meaning this test can be used) for the duration of the  Covid-19 declaration under Section 564(b)(1) of the Act, 21  U.S.C. section 360bbb-3(b)(1), unless the authorization is  terminated or revoked. Performed at [facility], [address] [place], [place] [address]     Coagulation Studies: No results for input(s): LABPROT, INR in the last 72 hours.  Urinalysis: No results for input(s): COLORURINE, LABSPEC, PHURINE, GLUCOSEU, HGBUR, BILIRUBINUR, KETONESUR, PROTEINUR, UROBILINOGEN, NITRITE, LEUKOCYTESUR in the last 168 hours.  Invalid input(s): APPERANCEUR  Lipid Panel:     Component Value Date/Time   CHOL 168 09/17/2020 0521   CHOL 196 11/27/2013 0512   TRIG 60 09/17/2020 0521   TRIG 219 (H) 11/27/2013 0512   HDL 51 09/17/2020 0521   HDL 29 (L) 11/27/2013 0512   CHOLHDL 3.3 09/17/2020 0521   VLDL 12 09/17/2020  0521   VLDL 44 (H) 11/27/2013 0512   LDLCALC 105 (H) 09/17/2020 0521   LDLCALC 123 (H) 11/27/2013 0512    HgbA1C:  Lab Results  Component Value Date   HGBA1C 5.8 (H) 06/20/2020    Urine Drug Screen:      Component Value Date/Time   LABOPIA NONE DETECTED 07/20/2018 1016   COCAINSCRNUR NONE DETECTED 07/20/2018 1016   LABBENZ TEST NOT PERFORMED, REAGENT NOT AVAILABLE (A) 07/20/2018 1016   AMPHETMU NONE DETECTED 07/20/2018 1016   THCU NONE DETECTED 07/20/2018 1016   LABBARB NONE DETECTED 07/20/2018 1016    Alcohol Level: No results for input(s): ETH in the last 168 hours.   Imaging: No results found.   Assessment/Plan: 59 y.o. male with a history of seizures on Depakote and Tegretol who presented with complaints of generalized weakness.  Patient found to be hyponatremic.  Tegretol was discontinued and patient had a breakthrough seizure.  Patient now back on Tegretol.  Patient has had intermittent issues with Tegretol in the past causing hyponatremia.  At one time was scheduled by his neurologist to be admitted for transition from Tegretol but reports that he was unable to afford it at that time and was started on salt tablets.  Has remained on this regimen.  Since hospitalized at this time, this should be an opportune time to make this transition.  Recommendations: 1. Tegretol and Depakote levels 2. Start Keppra with IV load at 1000mg  today and start of maintenance this evening at 500mg  po BID 3. In AM if patient tolerating Keppra will start taper of Tegretol. 4. Seizure precautions 5. Continue to follow sodium daily 6. Continue Depakote at current dose  Alexis Goodell, MD Neurology  09/28/2020, 11:11 AM

## 2020-09-28 NOTE — Plan of Care (Signed)
  Problem: Medication: Goal: Risk for medication side effects will decrease Outcome: Progressing No seizure activity noted this shift.  Medications given as ordered.   Problem: Safety: Goal: Verbalization of understanding the information provided will improve Outcome: Progressing  Pt remained in bed this shift with padded side rails in place.

## 2020-09-28 NOTE — Progress Notes (Signed)
[facility]  ROUNDING NOTE   Subjective:   Patient awake and alert this morning,requesting for PT evaluation today. His Sodium level is back to his baseline,133 today.  Objective:  Vital signs in last 24 hours:  Temp:  [98 F (36.7 C)-99 F (37.2 C)] 98 F (36.7 C) (10/13 0757) Pulse Rate:  [62-83] 69 (10/13 0757) Resp:  [16-20] 18 (10/13 0414) BP: (94-110)/(42-71) 107/71 (10/13 0757) SpO2:  [96 %-100 %] 100 % (10/13 0757) Weight:  [55.4 kg] 55.4 kg (10/13 0414)  Weight change:  Filed Weights   09/25/20 0425 09/26/20 0332 09/28/20 0414  Weight: 54.4 kg 59.2 kg 55.4 kg    Intake/Output: I/O last 3 completed shifts: In: 700 [P.O.:700] Out: 2850 [Urine:2850]   Intake/Output this shift:  Total I/O In: -  Out: 300 [Urine:300]  Physical Exam: General:  Awake,alert, in no acute distress  Head: Normocephalic, atraumatic  Lungs:   Lungs clear,Respirations even and unlabored  Heart:  S1S2, regular  Abdomen:  Soft, nondistended  Extremities:  No peripheral edema.  Neurologic: Oriented x 3  Skin: No acute lesions or rashes    Basic Metabolic Panel: Recent Labs  Lab 09/24/20 0345 09/24/20 0345 09/25/20 0420 09/25/20 0420 09/26/20 0426 09/27/20 0650 09/27/20 0651 09/28/20 0354  NA 124*  --  129*  --  128*  --  132* 133*  K 3.7  --  4.1  --  4.2  --  4.6 4.6  CL 97*  --  98  --  98  --  97* 98  CO2 24  --  24  --  25  --  28 26  GLUCOSE 96  --  85  --  83  --  84 92  BUN 11  --  10  --  13  --  17 25*  CREATININE 0.90  --  0.74  --  0.85  --  0.97 1.18  CALCIUM 7.4*   < > 7.6*   < > 7.7*  --  9.0 8.9  MG  --   --   --   --   --  2.1  --   --    < > = values in this interval not displayed.    Liver Function Tests: Recent Labs  Lab 09/23/20 0230  AST 29  ALT 20  ALKPHOS 30*  BILITOT 0.9  PROT 6.8  ALBUMIN 3.6   No results for input(s): LIPASE, AMYLASE in the last 168 hours. No results for input(s): AMMONIA in the last 168  hours.  CBC: Recent Labs  Lab 09/23/20 0230 09/23/20 0230 09/24/20 0345 09/25/20 0420 09/26/20 0426 09/27/20 0651 09/28/20 0354  WBC 4.6   < > 3.8* 3.6* 3.7* 4.3 4.4  NEUTROABS 3.3  --   --   --   --   --   --   HGB 11.5*   < > 8.8* 8.8* 8.6* 9.7* 9.3*  HCT 32.5*   < > 24.7* 25.2* 25.0* 27.7* 27.0*  MCV 94.8   < > 93.9 96.9 96.9 96.9 96.8  PLT 146*   < > 134* 151 169 210 242   < > = values in this interval not displayed.    Cardiac Enzymes: No results for input(s): CKTOTAL, CKMB, CKMBINDEX, TROPONINI in the last 168 hours.  BNP: Invalid input(s): POCBNP  CBG: No results for input(s): GLUCAP in the last 168 hours.  Microbiology: Results for orders placed or performed during the hospital encounter of 09/23/20  Respiratory  Panel by RT PCR (Flu A&B, Covid) - Nasopharyngeal Swab     Status: None   Collection Time: 09/23/20  5:57 AM   Specimen: Nasopharyngeal Swab  Result Value Ref Range Status   SARS Coronavirus 2 by RT PCR NEGATIVE NEGATIVE Final    Comment: (NOTE) SARS-CoV-2 target nucleic acids are NOT DETECTED.  The SARS-CoV-2 RNA is generally detectable in upper respiratoy specimens during the acute phase of infection. The lowest concentration of SARS-CoV-2 viral copies this assay can detect is 131 copies/mL. A negative result does not preclude SARS-Cov-2 infection and should not be used as the sole basis for treatment or other patient management decisions. A negative result may occur with  improper specimen collection/handling, submission of specimen other than nasopharyngeal swab, presence of viral mutation(s) within the areas targeted by this assay, and inadequate number of viral copies (<131 copies/mL). A negative result must be combined with clinical observations, patient history, and epidemiological information. The expected result is Negative.  Fact Sheet for Patients:  PinkCheek.be  Fact Sheet for Healthcare Providers:   GravelBags.it  This test is no t yet approved or cleared by the [place] FDA and  has been authorized for detection and/or diagnosis of SARS-CoV-2 by FDA under an Emergency Use Authorization (EUA). This EUA will remain  in effect (meaning this test can be used) for the duration of the COVID-19 declaration under Section 564(b)(1) of the Act, 21 U.S.C. section 360bbb-3(b)(1), unless the authorization is terminated or revoked sooner.     Influenza A by PCR NEGATIVE NEGATIVE Final   Influenza B by PCR NEGATIVE NEGATIVE Final    Comment: (NOTE) The Xpert Xpress SARS-CoV-2/FLU/RSV assay is intended as an aid in  the diagnosis of influenza from Nasopharyngeal swab specimens and  should not be used as a sole basis for treatment. Nasal washings and  aspirates are unacceptable for Xpert Xpress SARS-CoV-2/FLU/RSV  testing.  Fact Sheet for Patients: PinkCheek.be  Fact Sheet for Healthcare Providers: GravelBags.it  This test is not yet approved or cleared by the [place] FDA and  has been authorized for detection and/or diagnosis of SARS-CoV-2 by  FDA under an Emergency Use Authorization (EUA). This EUA will remain  in effect (meaning this test can be used) for the duration of the  Covid-19 declaration under Section 564(b)(1) of the Act, 21  U.S.C. section 360bbb-3(b)(1), unless the authorization is  terminated or revoked. Performed at [facility], [address] [place], [place] [address]     Coagulation Studies: No results for input(s): LABPROT, INR in the last 72 hours.  Urinalysis: No results for input(s): COLORURINE, LABSPEC, PHURINE, GLUCOSEU, HGBUR, BILIRUBINUR, KETONESUR, PROTEINUR, UROBILINOGEN, NITRITE, LEUKOCYTESUR in the last 72 hours.  Invalid input(s): APPERANCEUR    Imaging: No results found.   Medications:    . atorvastatin  80 mg Oral Daily  .  carbamazepine  400 mg Oral BID  . divalproex  750 mg Oral Q12H  . heparin injection (subcutaneous)  5,000 Units Subcutaneous Q8H  . sodium chloride flush  3 mL Intravenous Q12H  . sodium chloride  2 g Oral TID WC  . tamsulosin  0.4 mg Oral Daily   acetaminophen **OR** acetaminophen, ondansetron **OR** ondansetron (ZOFRAN) IV  Assessment/ Plan:  [name] is a 59 y.o. white male with seizures, hypertension, anemia, GERD who was admitted to [facility] on 09/23/2020 for Hyponatremia [E87.1]  1. Hyponatremia  Na+ back to baseline 133 today  2. Hypertension:  Antihypertensives currently on hold  Blood pressure readings remains in normal range  3.  Seizure disorder Tegretol restarted yesterday Continue Depakote  Nephrology will follow up as outpatient if getting discharged today    LOS: 5 Shenouda Genova 10/13/202111:04 AM

## 2020-09-29 DIAGNOSIS — G40909 Epilepsy, unspecified, not intractable, without status epilepticus: Secondary | ICD-10-CM

## 2020-09-29 DIAGNOSIS — E871 Hypo-osmolality and hyponatremia: Secondary | ICD-10-CM | POA: Diagnosis not present

## 2020-09-29 LAB — CBC
HCT: 25.5 % — ABNORMAL LOW (ref 39.0–52.0)
Hemoglobin: 8.8 g/dL — ABNORMAL LOW (ref 13.0–17.0)
MCH: 33.6 pg (ref 26.0–34.0)
MCHC: 34.5 g/dL (ref 30.0–36.0)
MCV: 97.3 fL (ref 80.0–100.0)
Platelets: 248 10*3/uL (ref 150–400)
RBC: 2.62 MIL/uL — ABNORMAL LOW (ref 4.22–5.81)
RDW: 14.1 % (ref 11.5–15.5)
WBC: 4.6 10*3/uL (ref 4.0–10.5)
nRBC: 0 % (ref 0.0–0.2)

## 2020-09-29 LAB — CARBAMAZEPINE LEVEL, TOTAL: Carbamazepine Lvl: 9 ug/mL (ref 4.0–12.0)

## 2020-09-29 LAB — BASIC METABOLIC PANEL
Anion gap: 9 (ref 5–15)
BUN: 27 mg/dL — ABNORMAL HIGH (ref 8–23)
CO2: 29 mmol/L (ref 22–32)
Calcium: 8.8 mg/dL — ABNORMAL LOW (ref 8.9–10.3)
Chloride: 98 mmol/L (ref 98–111)
Creatinine, Ser: 1.07 mg/dL (ref 0.61–1.24)
GFR, Estimated: >60 mL/min (ref >60)
Glucose, Bld: 83 mg/dL (ref 70–99)
Potassium: 4.5 mmol/L (ref 3.5–5.1)
Sodium: 136 mmol/L (ref 135–145)

## 2020-09-29 LAB — IRON AND TIBC
Iron: 81 ug/dL (ref 45–182)
Saturation Ratios: 29 % (ref 17.9–39.5)
TIBC: 279 ug/dL (ref 250–450)
UIBC: 198 ug/dL

## 2020-09-29 LAB — MAGNESIUM: Magnesium: 1.6 mg/dL — ABNORMAL LOW (ref 1.7–2.4)

## 2020-09-29 LAB — VALPROIC ACID LEVEL: Valproic Acid Lvl: 69 ug/mL (ref 50.0–100.0)

## 2020-09-29 LAB — VITAMIN B12: Vitamin B-12: 259 pg/mL (ref 180–914)

## 2020-09-29 LAB — FOLATE: Folate: 1.6 ng/mL — ABNORMAL LOW (ref >5.9)

## 2020-09-29 MED ORDER — MAGNESIUM SULFATE 2 GM/50ML IV SOLN
2.0000 g | Freq: Once | INTRAVENOUS | Status: AC
  Administered 2020-09-29: 2 g via INTRAVENOUS
  Filled 2020-09-29: qty 50

## 2020-09-29 MED ORDER — CARBAMAZEPINE 200 MG PO TABS
200.0000 mg | ORAL_TABLET | Freq: Two times a day (BID) | ORAL | Status: DC
  Administered 2020-09-29 (×2): 200 mg via ORAL
  Filled 2020-09-29 (×3): qty 1

## 2020-09-29 MED ORDER — SODIUM CHLORIDE 1 G PO TABS
1.0000 g | ORAL_TABLET | Freq: Three times a day (TID) | ORAL | Status: DC
  Administered 2020-09-29 – 2020-09-30 (×3): 1 g via ORAL
  Filled 2020-09-29 (×5): qty 1

## 2020-09-29 MED ORDER — FOLIC ACID 1 MG PO TABS
1.0000 mg | ORAL_TABLET | Freq: Every day | ORAL | Status: DC
  Administered 2020-09-29 – 2020-10-01 (×3): 1 mg via ORAL
  Filled 2020-09-29 (×3): qty 1

## 2020-09-29 NOTE — Progress Notes (Signed)
Subjective: No further seizures noted.  Patient reports no side effects to Keppra  Objective: Current vital signs: BP 102/67 (BP Location: Right Arm)   Pulse (!) 58   Temp 97.8 F (36.6 C) (Oral)   Resp 18   Ht 5\' 3"  (1.6 m)   Wt 54.5 kg   SpO2 100%   BMI 21.27 kg/m  Vital signs in last 24 hours: Temp:  [97.8 F (36.6 C)-98.3 F (36.8 C)] 97.8 F (36.6 C) (10/14 0741) Pulse Rate:  [58-78] 58 (10/14 0741) Resp:  [16-20] 18 (10/14 0741) BP: (91-115)/(59-72) 102/67 (10/14 0741) SpO2:  [97 %-100 %] 100 % (10/14 0741) Weight:  [54.5 kg] 54.5 kg (10/14 0505)  Intake/Output from previous day: 10/13 0701 - 10/14 0700 In: 1200 [P.O.:1200] Out: 1975 [Urine:1975] Intake/Output this shift: No intake/output data recorded. Nutritional status:  Diet Order            Diet Heart Room service appropriate? Yes; Fluid consistency: Thin; Fluid restriction: 1200 mL Fluid  Diet effective now                 Neurologic Exam: Mental Status: Alert, oriented, thought content appropriate.  Speech fluent without evidence of aphasia.  Able to follow 3 step commands without difficulty. Cranial Nerves: II: Visual fields grossly normal, pupils equal, round, reactive to light and accommodation III,IV, VI: left ptosis, extra-ocular motions intact bilaterally V,VII: smile symmetric, facial light touch sensation normal bilaterally VIII: hearing normal bilaterally IX,X: gag reflex present XI: bilateral shoulder shrug XII: midline tongue extension Motor: Right :  Upper extremity   5/5                                      Left:     Upper extremity   5/5             Lower extremity   5/5                                                  Lower extremity   5/5 Tone and bulk:normal tone throughout; no atrophy noted Sensory: Pinprick and light touch intact throughout, bilaterally Deep Tendon Reflexes: Increased throughout with bilateral sustained ankle clonus   Lab Results: Basic Metabolic  Panel: Recent Labs  Lab 09/25/20 0420 09/25/20 0420 09/26/20 0426 09/26/20 0426 09/27/20 0650 09/27/20 0651 09/28/20 0354 09/29/20 0320  NA 129*  --  128*  --   --  132* 133* 136  K 4.1  --  4.2  --   --  4.6 4.6 4.5  CL 98  --  98  --   --  97* 98 98  CO2 24  --  25  --   --  28 26 29   GLUCOSE 85  --  83  --   --  84 92 83  BUN 10  --  13  --   --  17 25* 27*  CREATININE 0.74  --  0.85  --   --  0.97 1.18 1.07  CALCIUM 7.6*   < > 7.7*   < >  --  9.0 8.9 8.8*  MG  --   --   --   --  2.1  --   --  1.6*   < > =  values in this interval not displayed.    Liver Function Tests: Recent Labs  Lab 09/23/20 0230  AST 29  ALT 20  ALKPHOS 30*  BILITOT 0.9  PROT 6.8  ALBUMIN 3.6   No results for input(s): LIPASE, AMYLASE in the last 168 hours. No results for input(s): AMMONIA in the last 168 hours.  CBC: Recent Labs  Lab 09/23/20 0230 09/24/20 0345 09/25/20 0420 09/26/20 0426 09/27/20 0651 09/28/20 0354 09/29/20 0320  WBC 4.6   < > 3.6* 3.7* 4.3 4.4 4.6  NEUTROABS 3.3  --   --   --   --   --   --   HGB 11.5*   < > 8.8* 8.6* 9.7* 9.3* 8.8*  HCT 32.5*   < > 25.2* 25.0* 27.7* 27.0* 25.5*  MCV 94.8   < > 96.9 96.9 96.9 96.8 97.3  PLT 146*   < > 151 169 210 242 248   < > = values in this interval not displayed.    Cardiac Enzymes: No results for input(s): CKTOTAL, CKMB, CKMBINDEX, TROPONINI in the last 168 hours.  Lipid Panel: No results for input(s): CHOL, TRIG, HDL, CHOLHDL, VLDL, LDLCALC in the last 168 hours.  CBG: No results for input(s): GLUCAP in the last 168 hours.  Microbiology: Results for orders placed or performed during the hospital encounter of 09/23/20  Respiratory Panel by RT PCR (Flu A&B, Covid) - Nasopharyngeal Swab     Status: None   Collection Time: 09/23/20  5:57 AM   Specimen: Nasopharyngeal Swab  Result Value Ref Range Status   SARS Coronavirus 2 by RT PCR NEGATIVE NEGATIVE Final    Comment: (NOTE) SARS-CoV-2 target nucleic acids are NOT  DETECTED.  The SARS-CoV-2 RNA is generally detectable in upper respiratoy specimens during the acute phase of infection. The lowest concentration of SARS-CoV-2 viral copies this assay can detect is 131 copies/mL. A negative result does not preclude SARS-Cov-2 infection and should not be used as the sole basis for treatment or other patient management decisions. A negative result may occur with  improper specimen collection/handling, submission of specimen other than nasopharyngeal swab, presence of viral mutation(s) within the areas targeted by this assay, and inadequate number of viral copies (<131 copies/mL). A negative result must be combined with clinical observations, patient history, and epidemiological information. The expected result is Negative.  Fact Sheet for Patients:  PinkCheek.be  Fact Sheet for Healthcare Providers:  GravelBags.it  This test is no t yet approved or cleared by the [place] FDA and  has been authorized for detection and/or diagnosis of SARS-CoV-2 by FDA under an Emergency Use Authorization (EUA). This EUA will remain  in effect (meaning this test can be used) for the duration of the COVID-19 declaration under Section 564(b)(1) of the Act, 21 U.S.C. section 360bbb-3(b)(1), unless the authorization is terminated or revoked sooner.     Influenza A by PCR NEGATIVE NEGATIVE Final   Influenza B by PCR NEGATIVE NEGATIVE Final    Comment: (NOTE) The Xpert Xpress SARS-CoV-2/FLU/RSV assay is intended as an aid in  the diagnosis of influenza from Nasopharyngeal swab specimens and  should not be used as a sole basis for treatment. Nasal washings and  aspirates are unacceptable for Xpert Xpress SARS-CoV-2/FLU/RSV  testing.  Fact Sheet for Patients: PinkCheek.be  Fact Sheet for Healthcare Providers: GravelBags.it  This test is not yet  approved or cleared by the [place] FDA and  has been authorized for detection and/or diagnosis of SARS-CoV-2  by  FDA under an Emergency Use Authorization (EUA). This EUA will remain  in effect (meaning this test can be used) for the duration of the  Covid-19 declaration under Section 564(b)(1) of the Act, 21  U.S.C. section 360bbb-3(b)(1), unless the authorization is  terminated or revoked. Performed at [facility], [address] [place], [place] [address]     Coagulation Studies: No results for input(s): LABPROT, INR in the last 72 hours.  Imaging: No results found.  Medications:  I have reviewed the patient's current medications. Scheduled: . atorvastatin  80 mg Oral Daily  . carbamazepine  200 mg Oral BID  . divalproex  750 mg Oral Q12H  . heparin injection (subcutaneous)  5,000 Units Subcutaneous Q8H  . levETIRAcetam  500 mg Oral BID  . sodium chloride flush  3 mL Intravenous Q12H  . sodium chloride  2 g Oral TID WC  . tamsulosin  0.4 mg Oral Daily    Assessment/Plan: 59 y.o. male with a history of seizures on Depakote and Tegretol who presented with complaints of generalized weakness.  Patient found to be hyponatremic.  Tegretol was discontinued and patient had a breakthrough seizure.  Patient now back on Tegretol.  Patient has had intermittent issues with Tegretol in the past causing hyponatremia.   Keppra loaded on yesterday and patient appears to be tolerating well.  Tegretol level 9.0, VPA level 69.     Recommendations: 1. Continue Keppra at 500mg  BID 2. Decreased Tegretol to 200mg  BID starting today 3. Seizure precautions 4. Continue to follow sodium daily 5. Continue Depakote at current dose   LOS: 6 days   Alexis Goodell, MD Neurology  09/29/2020  9:21 AM

## 2020-09-29 NOTE — Progress Notes (Signed)
Physical Therapy Treatment Patient Details Name: [name] MRN: [id number] DOB: 04/20/1961 Today's Date: 09/29/2020    History of Present Illness Pt is a 59 y/o M who was admitted to [facility] with Bilateral UE and LE weakness secondary to Hyponatremia. PMH includes: migraines, seuzires, GERD, and HTN. Note: recent hospital stay at [facility] earlier this month (09/2020) d/t fall and dizziness.    PT Comments    Pt wanting to get up and do some activity, and pleased to have done so post session.  Minimal cuing for safety with transition to standing and first few steps, but after only mild initial unsteadiness (easily self arrests w/o LOBs) we were able to ambulate >300 ft w/o AD and with relatively consistent cadence, negotiated up/down 10 steps with single rail use and reciprocal pattern to ascend and step-to strategy to descend.  Pt feeling confident about being able to go home, has friends that can assist with errands, etc.  Pt reports they are not going to be doing any more driving.  Follow Up Recommendations  Home health PT;Supervision - Intermittent     Equipment Recommendations       Recommendations for Other Services       Precautions / Restrictions Precautions Precautions: Fall Restrictions Weight Bearing Restrictions: No    Mobility  Bed Mobility Overal bed mobility: Modified Independent                Transfers Overall transfer level: Modified independent Equipment used: None Transfers: Sit to/from Stand Sit to Stand: Supervision         General transfer comment: multiple sit to stand attempts  Ambulation/Gait Ambulation/Gait assistance: Min guard Gait Distance (Feet): 300 Feet Assistive device: None       General Gait Details: Pt with some minimal initial stagger stepping (with no overt LOBs) and quickly became more stable/confident with increased distance and overall did well and reports being near baseline   Stairs Stairs: Yes Stairs  assistance: Modified independent (Device/Increase time) Stair Management: One rail Right Number of Stairs: 10 General stair comments: Pt was able to negotiate steps confidently and w/o safety concerns using single rail   Wheelchair Mobility    Modified Rankin (Stroke Patients Only)       Balance Overall balance assessment: Needs assistance   Sitting balance-Leahy Scale: Good     Standing balance support: During functional activity;No upper extremity supported;Bilateral upper extremity supported Standing balance-Leahy Scale: Good Standing balance comment: Pt with no overt LOBs, did display some mild unsteadiness on initially getting up (reports as baseline).  Was able to ambulate                             Cognition Arousal/Alertness: Awake/alert Behavior During Therapy: WFL for tasks assessed/performed Overall Cognitive Status: Within Functional Limits for tasks assessed                                        Exercises      General Comments        Pertinent Vitals/Pain Pain Assessment: No/denies pain    Home Living                      Prior Function            PT Goals (current goals can now be found in the care  plan section) Progress towards PT goals: Progressing toward goals    Frequency    Min 2X/week      PT Plan Discharge plan needs to be updated    Co-evaluation              AM-PAC PT "6 Clicks" Mobility   Outcome Measure  Help needed turning from your back to your side while in a flat bed without using bedrails?: None Help needed moving from lying on your back to sitting on the side of a flat bed without using bedrails?: None Help needed moving to and from a bed to a chair (including a wheelchair)?: None Help needed standing up from a chair using your arms (e.g., wheelchair or bedside chair)?: None Help needed to walk in hospital room?: None Help needed climbing 3-5 steps with a railing? : None 6  Click Score: 24    End of Session Equipment Utilized During Treatment: Gait belt Activity Tolerance: Patient tolerated treatment well Patient left: with call bell/phone within reach;with bed alarm set   PT Visit Diagnosis: Unsteadiness on feet (R26.81);Other abnormalities of gait and mobility (R26.89);Repeated falls (R29.6);History of falling (Z91.81);Muscle weakness (generalized) (M62.81);Difficulty in walking, not elsewhere classified (R26.2)     Time: 9150-4136 PT Time Calculation (min) (ACUTE ONLY): 12 min  Charges:  $Gait Training: 8-22 mins                     Kreg Shropshire, DPT 09/29/2020, 1:15 PM

## 2020-09-29 NOTE — Progress Notes (Signed)
PROGRESS NOTE    [name]  PRF:[id number] DOB: Jun 14, 1961 DOA: 09/23/2020 PCP: [name], MD  Assessment & Plan:   Principal Problem:   Chronic hyponatremia Active Problems:   Essential hypertension   Generalized weakness   CKD (chronic kidney disease) stage 2, GFR 60-89 ml/min   Acute hyponatremia   Chronic anemia   Seizure disorder ([place])   Seizure disorder:  --Pt has been taking both Tegretol and Depakote since at least 2016, and the effect of Tegretol on his hyponatremia was mentioned even back in 2016.  There had been discussion about switching pt's anti-epileptics, however, it didn't happen due to some insurance reasons. --consulted neuro on 10/13 for rec on alternative anti-epileptics.  Pt was loaded with IV keppra. PLAN: --cont keppra 500 mg BID --continue Tegretol at reduced 200 mg BID --continue home depakote --monitor Na  Acute on chronic hyponatremia: w/ hx of hyperosmolality.  --thought to be worsened by Tegretol --nephrology consulted PLAN: --reduce salt tablet to 1g TID --continue Tegretol at reduced 200 mg BID, per Neuro  Generalized weakness:  likely secondary to hyponatremia. --tx hyponatremia --Will go home w/ home health   Anemia with folate def --anemia workup showed normal iron level, vit B12 259, folate low at 1.6 PLAN: --start oral folate supplement  HTN:  --continue to hold home Lisinopril and propanolol 2/2 low BP  BPH:  --cont home flomax  HLD: continue on statin   CKDII: stable.   Thrombocytopenia: resolved    DVT prophylaxis: lovenox  Code Status:  Full  Family Communication:  Disposition Plan: home w HH (pt refused SNF) Status is: Inpatient  Anticipated d/c date is: tomorrow Patient currently is not medically stable to d/c.  Neurology consulted to transition pt to a different seizure med to avoid further hyponatremia (for which pt was hospitalized this time), need 1 more day of monitoring Na while on new  seizure meds.   Consultants:   nephro    Procedures:    Antimicrobials:    Subjective: Pt received IV keppra load yesterday.  Tolerated it well.  No complaints today.  Normal oral intake.    Objective: Vitals:   09/29/20 0505 09/29/20 0741 09/29/20 1122 09/29/20 1605  BP: (!) 91/59 102/67 (!) 97/58 112/63  Pulse: (!) 59 (!) 58 (!) 59 71  Resp: 16 18 17 16   Temp: 98.2 F (36.8 C) 97.8 F (36.6 C) 98 F (36.7 C) 98.3 F (36.8 C)  TempSrc: Oral Oral Oral Oral  SpO2: 100% 100% 100% 100%  Weight: 54.5 kg     Height:        Intake/Output Summary (Last 24 hours) at 09/29/2020 1642 Last data filed at 09/29/2020 1500 Gross per 24 hour  Intake 1440 ml  Output 2050 ml  Net -610 ml   Filed Weights   09/26/20 0332 09/28/20 0414 09/29/20 0505  Weight: 59.2 kg 55.4 kg 54.5 kg    Examination:  Constitutional: NAD, AAOx3 HEENT: conjunctivae and lids normal, EOMI CV: No cyanosis.   RESP: clear, normal respiratory effort, on RA Extremities: No effusions, edema in BLE SKIN: warm, dry and intact Neuro: II - XII grossly intact.   Psych: Normal mood and affect.  Appropriate judgement and reason   Data Reviewed: I have personally reviewed following labs and imaging studies  CBC: Recent Labs  Lab 09/23/20 0230 09/24/20 0345 09/25/20 0420 09/26/20 0426 09/27/20 0651 09/28/20 0354 09/29/20 0320  WBC 4.6   < > 3.6* 3.7* 4.3 4.4 4.6  NEUTROABS  3.3  --   --   --   --   --   --   HGB 11.5*   < > 8.8* 8.6* 9.7* 9.3* 8.8*  HCT 32.5*   < > 25.2* 25.0* 27.7* 27.0* 25.5*  MCV 94.8   < > 96.9 96.9 96.9 96.8 97.3  PLT 146*   < > 151 169 210 242 248   < > = values in this interval not displayed.   Basic Metabolic Panel: Recent Labs  Lab 09/25/20 0420 09/26/20 0426 09/27/20 0650 09/27/20 0651 09/28/20 0354 09/29/20 0320  NA 129* 128*  --  132* 133* 136  K 4.1 4.2  --  4.6 4.6 4.5  CL 98 98  --  97* 98 98  CO2 24 25  --  28 26 29   GLUCOSE 85 83  --  84 92 83  BUN  10 13  --  17 25* 27*  CREATININE 0.74 0.85  --  0.97 1.18 1.07  CALCIUM 7.6* 7.7*  --  9.0 8.9 8.8*  MG  --   --  2.1  --   --  1.6*   GFR: Estimated Creatinine Clearance: 55.9 mL/min (by C-G formula based on SCr of 1.07 mg/dL). Liver Function Tests: Recent Labs  Lab 09/23/20 0230  AST 29  ALT 20  ALKPHOS 30*  BILITOT 0.9  PROT 6.8  ALBUMIN 3.6   No results for input(s): LIPASE, AMYLASE in the last 168 hours. No results for input(s): AMMONIA in the last 168 hours. Coagulation Profile: No results for input(s): INR, PROTIME in the last 168 hours. Cardiac Enzymes: No results for input(s): CKTOTAL, CKMB, CKMBINDEX, TROPONINI in the last 168 hours. BNP (last 3 results) No results for input(s): PROBNP in the last 8760 hours. HbA1C: No results for input(s): HGBA1C in the last 72 hours. CBG: No results for input(s): GLUCAP in the last 168 hours. Lipid Profile: No results for input(s): CHOL, HDL, LDLCALC, TRIG, CHOLHDL, LDLDIRECT in the last 72 hours. Thyroid Function Tests: No results for input(s): TSH, T4TOTAL, FREET4, T3FREE, THYROIDAB in the last 72 hours. Anemia Panel: Recent Labs    09/29/20 0320  VITAMINB12 259  FOLATE 1.6*  TIBC 279  IRON 81   Sepsis Labs: No results for input(s): PROCALCITON, LATICACIDVEN in the last 168 hours.  Recent Results (from the past 240 hour(s))  Respiratory Panel by RT PCR (Flu A&B, Covid) - Nasopharyngeal Swab     Status: None   Collection Time: 09/23/20  5:57 AM   Specimen: Nasopharyngeal Swab  Result Value Ref Range Status   SARS Coronavirus 2 by RT PCR NEGATIVE NEGATIVE Final    Comment: (NOTE) SARS-CoV-2 target nucleic acids are NOT DETECTED.  The SARS-CoV-2 RNA is generally detectable in upper respiratoy specimens during the acute phase of infection. The lowest concentration of SARS-CoV-2 viral copies this assay can detect is 131 copies/mL. A negative result does not preclude SARS-Cov-2 infection and should not be used as  the sole basis for treatment or other patient management decisions. A negative result may occur with  improper specimen collection/handling, submission of specimen other than nasopharyngeal swab, presence of viral mutation(s) within the areas targeted by this assay, and inadequate number of viral copies (<131 copies/mL). A negative result must be combined with clinical observations, patient history, and epidemiological information. The expected result is Negative.  Fact Sheet for Patients:  PinkCheek.be  Fact Sheet for Healthcare Providers:  GravelBags.it  This test is no t yet approved or  cleared by the [place] and  has been authorized for detection and/or diagnosis of SARS-CoV-2 by FDA under an Emergency Use Authorization (EUA). This EUA will remain  in effect (meaning this test can be used) for the duration of the COVID-19 declaration under Section 564(b)(1) of the Act, 21 U.S.C. section 360bbb-3(b)(1), unless the authorization is terminated or revoked sooner.     Influenza A by PCR NEGATIVE NEGATIVE Final   Influenza B by PCR NEGATIVE NEGATIVE Final    Comment: (NOTE) The Xpert Xpress SARS-CoV-2/FLU/RSV assay is intended as an aid in  the diagnosis of influenza from Nasopharyngeal swab specimens and  should not be used as a sole basis for treatment. Nasal washings and  aspirates are unacceptable for Xpert Xpress SARS-CoV-2/FLU/RSV  testing.  Fact Sheet for Patients: PinkCheek.be  Fact Sheet for Healthcare Providers: GravelBags.it  This test is not yet approved or cleared by the [place] FDA and  has been authorized for detection and/or diagnosis of SARS-CoV-2 by  FDA under an Emergency Use Authorization (EUA). This EUA will remain  in effect (meaning this test can be used) for the duration of the  Covid-19 declaration under Section 564(b)(1)  of the Act, 21  U.S.C. section 360bbb-3(b)(1), unless the authorization is  terminated or revoked. Performed at [facility], [address], [place], [place] [address]          Radiology Studies: No results found.      Scheduled Meds: . atorvastatin  80 mg Oral Daily  . carbamazepine  200 mg Oral BID  . divalproex  750 mg Oral Q12H  . heparin injection (subcutaneous)  5,000 Units Subcutaneous Q8H  . levETIRAcetam  500 mg Oral BID  . sodium chloride flush  3 mL Intravenous Q12H  . sodium chloride  1 g Oral TID WC  . tamsulosin  0.4 mg Oral Daily   Continuous Infusions:    LOS: 6 days     Enzo Bi, MD [facility] Pager [phone]  If 7PM-7AM, please contact night-coverage [email or web address] 09/29/2020, 4:42 PM

## 2020-09-30 DIAGNOSIS — R569 Unspecified convulsions: Secondary | ICD-10-CM | POA: Diagnosis not present

## 2020-09-30 DIAGNOSIS — E871 Hypo-osmolality and hyponatremia: Secondary | ICD-10-CM | POA: Diagnosis not present

## 2020-09-30 LAB — CBC
HCT: 25.8 % — ABNORMAL LOW (ref 39.0–52.0)
Hemoglobin: 9.2 g/dL — ABNORMAL LOW (ref 13.0–17.0)
MCH: 33.9 pg (ref 26.0–34.0)
MCHC: 35.7 g/dL (ref 30.0–36.0)
MCV: 95.2 fL (ref 80.0–100.0)
Platelets: 277 10*3/uL (ref 150–400)
RBC: 2.71 MIL/uL — ABNORMAL LOW (ref 4.22–5.81)
RDW: 14.4 % (ref 11.5–15.5)
WBC: 5.1 10*3/uL (ref 4.0–10.5)
nRBC: 0 % (ref 0.0–0.2)

## 2020-09-30 LAB — BASIC METABOLIC PANEL
Anion gap: 7 (ref 5–15)
BUN: 26 mg/dL — ABNORMAL HIGH (ref 8–23)
CO2: 29 mmol/L (ref 22–32)
Calcium: 9 mg/dL (ref 8.9–10.3)
Chloride: 94 mmol/L — ABNORMAL LOW (ref 98–111)
Creatinine, Ser: 1.13 mg/dL (ref 0.61–1.24)
GFR, Estimated: >60 mL/min (ref >60)
Glucose, Bld: 86 mg/dL (ref 70–99)
Potassium: 4.6 mmol/L (ref 3.5–5.1)
Sodium: 130 mmol/L — ABNORMAL LOW (ref 135–145)

## 2020-09-30 LAB — MAGNESIUM: Magnesium: 1.9 mg/dL (ref 1.7–2.4)

## 2020-09-30 MED ORDER — SODIUM CHLORIDE 1 G PO TABS
2.0000 g | ORAL_TABLET | Freq: Three times a day (TID) | ORAL | Status: DC
  Administered 2020-09-30 – 2020-10-01 (×3): 2 g via ORAL
  Filled 2020-09-30 (×5): qty 2

## 2020-09-30 NOTE — Progress Notes (Signed)
PROGRESS NOTE    [name]  JKD:[id number] DOB: 06-28-1961 DOA: 09/23/2020 PCP: [name], MD  Assessment & Plan:   Principal Problem:   Chronic hyponatremia Active Problems:   Essential hypertension   Generalized weakness   CKD (chronic kidney disease) stage 2, GFR 60-89 ml/min   Acute hyponatremia   Chronic anemia   Seizure disorder ([employer])   Seizure disorder:  --Pt has been taking both Tegretol and Depakote since at least 2016, and the effect of Tegretol on his hyponatremia was mentioned even back in 2016.  There had been discussion about switching pt's anti-epileptics, however, it didn't happen due to some insurance reasons. --consulted neuro on 10/13 for rec on alternative anti-epileptics.  Pt was loaded with IV keppra. PLAN: --cont keppra 500 mg BID --continue Tegretol at reduced 200 mg BID --continue home depakote --Re-engage neuro tomorrow if Na continues to drop  Acute on chronic hyponatremia: w/ hx of hyperosmolality.  --thought to be worsened by Tegretol --nephrology consulted, home Tegretol reduced --salt tablet reduced yesterday, Na dropped by 6 today.Marland Kitchen PLAN: --increase salt tablets back up to home dose of 2g TID --continue Tegretol at reduced 200 mg BID, per Neuro --monitor Na  Generalized weakness:  likely secondary to hyponatremia. --tx hyponatremia --Will go home w/ home health   Anemia with folate def --anemia workup showed normal iron level, vit B12 259, folate low at 1.6 PLAN: --cont oral folate suppl (new)  HTN:  --on home Lisinopril and propanolol  --cont to hold both 2/2 low normal BP  BPH:  --cont home flomax  HLD: continue on statin   CKDII: stable.   Thrombocytopenia: resolved    DVT prophylaxis: lovenox  Code Status:  Full  Family Communication:  Disposition Plan: home w HH Status is: Inpatient  Anticipated d/c date is: tomorrow Patient currently is not medically stable to d/c.  Na dropped again by 6 points  today.  Can not discharge unless Na stabilizes.    Consultants:   nephro    Procedures:    Antimicrobials:    Subjective: Pt doing well.  Working with PT.  Normal oral intake.  No complaints.  Na dropped again today by 6 points.      Objective: Vitals:   09/30/20 0341 09/30/20 0741 09/30/20 1126 09/30/20 1535  BP: 94/61 111/77 102/65 109/69  Pulse: 62 72 67 77  Resp: 16 16 16 16   Temp: 98.3 F (36.8 C) 98.5 F (36.9 C) 98.4 F (36.9 C) 98.3 F (36.8 C)  TempSrc:  Oral Oral Oral  SpO2: 99% 99% 100% 100%  Weight: 54.9 kg     Height:        Intake/Output Summary (Last 24 hours) at 09/30/2020 1803 Last data filed at 09/30/2020 1350 Gross per 24 hour  Intake 720 ml  Output 1050 ml  Net -330 ml   Filed Weights   09/28/20 0414 09/29/20 0505 09/30/20 0341  Weight: 55.4 kg 54.5 kg 54.9 kg    Examination:  Constitutional: NAD, AAOx3 HEENT: conjunctivae and lids normal, EOMI CV: No cyanosis.   RESP: normal respiratory effort, on RA Extremities: No effusions, edema in BLE SKIN: warm, dry and intact Neuro: II - XII grossly intact.   Psych: Normal mood and affect.  Appropriate judgement and reason   Data Reviewed: I have personally reviewed following labs and imaging studies  CBC: Recent Labs  Lab 09/26/20 0426 09/27/20 0651 09/28/20 0354 09/29/20 0320 09/30/20 0344  WBC 3.7* 4.3 4.4 4.6 5.1  HGB  8.6* 9.7* 9.3* 8.8* 9.2*  HCT 25.0* 27.7* 27.0* 25.5* 25.8*  MCV 96.9 96.9 96.8 97.3 95.2  PLT 169 210 242 248 700   Basic Metabolic Panel: Recent Labs  Lab 09/26/20 0426 09/27/20 0650 09/27/20 0651 09/28/20 0354 09/29/20 0320 09/30/20 0344  NA 128*  --  132* 133* 136 130*  K 4.2  --  4.6 4.6 4.5 4.6  CL 98  --  97* 98 98 94*  CO2 25  --  28 26 29 29   GLUCOSE 83  --  84 92 83 86  BUN 13  --  17 25* 27* 26*  CREATININE 0.85  --  0.97 1.18 1.07 1.13  CALCIUM 7.7*  --  9.0 8.9 8.8* 9.0  MG  --  2.1  --   --  1.6* 1.9   GFR: Estimated Creatinine  Clearance: 53.3 mL/min (by C-G formula based on SCr of 1.13 mg/dL). Liver Function Tests: No results for input(s): AST, ALT, ALKPHOS, BILITOT, PROT, ALBUMIN in the last 168 hours. No results for input(s): LIPASE, AMYLASE in the last 168 hours. No results for input(s): AMMONIA in the last 168 hours. Coagulation Profile: No results for input(s): INR, PROTIME in the last 168 hours. Cardiac Enzymes: No results for input(s): CKTOTAL, CKMB, CKMBINDEX, TROPONINI in the last 168 hours. BNP (last 3 results) No results for input(s): PROBNP in the last 8760 hours. HbA1C: No results for input(s): HGBA1C in the last 72 hours. CBG: No results for input(s): GLUCAP in the last 168 hours. Lipid Profile: No results for input(s): CHOL, HDL, LDLCALC, TRIG, CHOLHDL, LDLDIRECT in the last 72 hours. Thyroid Function Tests: No results for input(s): TSH, T4TOTAL, FREET4, T3FREE, THYROIDAB in the last 72 hours. Anemia Panel: Recent Labs    09/29/20 0320  VITAMINB12 259  FOLATE 1.6*  TIBC 279  IRON 81   Sepsis Labs: No results for input(s): PROCALCITON, LATICACIDVEN in the last 168 hours.  Recent Results (from the past 240 hour(s))  Respiratory Panel by RT PCR (Flu A&B, Covid) - Nasopharyngeal Swab     Status: None   Collection Time: 09/23/20  5:57 AM   Specimen: Nasopharyngeal Swab  Result Value Ref Range Status   SARS Coronavirus 2 by RT PCR NEGATIVE NEGATIVE Final    Comment: (NOTE) SARS-CoV-2 target nucleic acids are NOT DETECTED.  The SARS-CoV-2 RNA is generally detectable in upper respiratoy specimens during the acute phase of infection. The lowest concentration of SARS-CoV-2 viral copies this assay can detect is 131 copies/mL. A negative result does not preclude SARS-Cov-2 infection and should not be used as the sole basis for treatment or other patient management decisions. A negative result may occur with  improper specimen collection/handling, submission of specimen other than  nasopharyngeal swab, presence of viral mutation(s) within the areas targeted by this assay, and inadequate number of viral copies (<131 copies/mL). A negative result must be combined with clinical observations, patient history, and epidemiological information. The expected result is Negative.  Fact Sheet for Patients:  PinkCheek.be  Fact Sheet for Healthcare Providers:  GravelBags.it  This test is no t yet approved or cleared by the [place] FDA and  has been authorized for detection and/or diagnosis of SARS-CoV-2 by FDA under an Emergency Use Authorization (EUA). This EUA will remain  in effect (meaning this test can be used) for the duration of the COVID-19 declaration under Section 564(b)(1) of the Act, 21 U.S.C. section 360bbb-3(b)(1), unless the authorization is terminated or revoked sooner.  Influenza A by PCR NEGATIVE NEGATIVE Final   Influenza B by PCR NEGATIVE NEGATIVE Final    Comment: (NOTE) The Xpert Xpress SARS-CoV-2/FLU/RSV assay is intended as an aid in  the diagnosis of influenza from Nasopharyngeal swab specimens and  should not be used as a sole basis for treatment. Nasal washings and  aspirates are unacceptable for Xpert Xpress SARS-CoV-2/FLU/RSV  testing.  Fact Sheet for Patients: PinkCheek.be  Fact Sheet for Healthcare Providers: GravelBags.it  This test is not yet approved or cleared by the [place] FDA and  has been authorized for detection and/or diagnosis of SARS-CoV-2 by  FDA under an Emergency Use Authorization (EUA). This EUA will remain  in effect (meaning this test can be used) for the duration of the  Covid-19 declaration under Section 564(b)(1) of the Act, 21  U.S.C. section 360bbb-3(b)(1), unless the authorization is  terminated or revoked. Performed at [facility], [address] [place],   [address]          Radiology Studies: No results found.      Scheduled Meds: . atorvastatin  80 mg Oral Daily  . divalproex  750 mg Oral Q12H  . folic acid  1 mg Oral Daily  . heparin injection (subcutaneous)  5,000 Units Subcutaneous Q8H  . levETIRAcetam  500 mg Oral BID  . sodium chloride flush  3 mL Intravenous Q12H  . sodium chloride  2 g Oral TID WC  . tamsulosin  0.4 mg Oral Daily   Continuous Infusions:    LOS: 7 days     Enzo Bi, MD [facility] Pager [phone]  If 7PM-7AM, please contact night-coverage [email or web address] 09/30/2020, 6:03 PM

## 2020-09-30 NOTE — Progress Notes (Signed)
Subjective: No further seizures noted.  Patient reports feeling better overall off Tegretol.  Objective: Current vital signs: BP 111/77 (BP Location: Right Arm)   Pulse 72   Temp 98.5 F (36.9 C) (Oral)   Resp 16   Ht 5\' 3"  (1.6 m)   Wt 54.9 kg   SpO2 99%   BMI 21.45 kg/m  Vital signs in last 24 hours: Temp:  [98 F (36.7 C)-98.5 F (36.9 C)] 98.5 F (36.9 C) (10/15 0741) Pulse Rate:  [59-72] 72 (10/15 0741) Resp:  [16-17] 16 (10/15 0741) BP: (94-112)/(58-77) 111/77 (10/15 0741) SpO2:  [94 %-100 %] 99 % (10/15 0741) Weight:  [54.9 kg] 54.9 kg (10/15 0341)  Intake/Output from previous day: 10/14 0701 - 10/15 0700 In: 480 [P.O.:480] Out: 1300 [Urine:1300] Intake/Output this shift: Total I/O In: 240 [P.O.:240] Out: -  Nutritional status:  Diet Order            Diet Heart Room service appropriate? Yes; Fluid consistency: Thin; Fluid restriction: 1200 mL Fluid  Diet effective now                 Neurologic Exam: Mental Status: Alert, oriented, thought content appropriate. Speech fluent without evidence of aphasia. Able to follow 3 step commands without difficulty. Cranial Nerves: II: Visual fields grossly normal, pupils equal, round, reactive to light and accommodation III,IV, [id number], extra-ocular motions intact bilaterally V,VII: smile symmetric, facial light touch sensation normal bilaterally VIII: hearing normal bilaterally IX,X: gag reflex present XI: bilateral shoulder shrug XII: midline tongue extension Motor: 5/5 throughout   Lab Results: Basic Metabolic Panel: Recent Labs  Lab 09/26/20 0426 09/26/20 0426 09/27/20 0650 09/27/20 0651 09/27/20 0651 09/28/20 0354 09/29/20 0320 09/30/20 0344  NA 128*  --   --  132*  --  133* 136 130*  K 4.2  --   --  4.6  --  4.6 4.5 4.6  CL 98  --   --  97*  --  98 98 94*  CO2 25  --   --  28  --  26 29 29   GLUCOSE 83  --   --  84  --  92 83 86  BUN 13  --   --  17  --  25* 27* 26*  CREATININE  0.85  --   --  0.97  --  1.18 1.07 1.13  CALCIUM 7.7*   < >  --  9.0   < > 8.9 8.8* 9.0  MG  --   --  2.1  --   --   --  1.6* 1.9   < > = values in this interval not displayed.    Liver Function Tests: No results for input(s): AST, ALT, ALKPHOS, BILITOT, PROT, ALBUMIN in the last 168 hours. No results for input(s): LIPASE, AMYLASE in the last 168 hours. No results for input(s): AMMONIA in the last 168 hours.  CBC: Recent Labs  Lab 09/26/20 0426 09/27/20 0651 09/28/20 0354 09/29/20 0320 09/30/20 0344  WBC 3.7* 4.3 4.4 4.6 5.1  HGB 8.6* 9.7* 9.3* 8.8* 9.2*  HCT 25.0* 27.7* 27.0* 25.5* 25.8*  MCV 96.9 96.9 96.8 97.3 95.2  PLT 169 210 242 248 277    Cardiac Enzymes: No results for input(s): CKTOTAL, CKMB, CKMBINDEX, TROPONINI in the last 168 hours.  Lipid Panel: No results for input(s): CHOL, TRIG, HDL, CHOLHDL, VLDL, LDLCALC in the last 168 hours.  CBG: No results for input(s): GLUCAP in the last 168 hours.  Microbiology: Results  for orders placed or performed during the hospital encounter of 09/23/20  Respiratory Panel by RT PCR (Flu A&B, Covid) - Nasopharyngeal Swab     Status: None   Collection Time: 09/23/20  5:57 AM   Specimen: Nasopharyngeal Swab  Result Value Ref Range Status   SARS Coronavirus 2 by RT PCR NEGATIVE NEGATIVE Final    Comment: (NOTE) SARS-CoV-2 target nucleic acids are NOT DETECTED.  The SARS-CoV-2 RNA is generally detectable in upper respiratoy specimens during the acute phase of infection. The lowest concentration of SARS-CoV-2 viral copies this assay can detect is 131 copies/mL. A negative result does not preclude SARS-Cov-2 infection and should not be used as the sole basis for treatment or other patient management decisions. A negative result may occur with  improper specimen collection/handling, submission of specimen other than nasopharyngeal swab, presence of viral mutation(s) within the areas targeted by this assay, and inadequate  number of viral copies (<131 copies/mL). A negative result must be combined with clinical observations, patient history, and epidemiological information. The expected result is Negative.  Fact Sheet for Patients:  PinkCheek.be  Fact Sheet for Healthcare Providers:  GravelBags.it  This test is no t yet approved or cleared by the [place] FDA and  has been authorized for detection and/or diagnosis of SARS-CoV-2 by FDA under an Emergency Use Authorization (EUA). This EUA will remain  in effect (meaning this test can be used) for the duration of the COVID-19 declaration under Section 564(b)(1) of the Act, 21 U.S.C. section 360bbb-3(b)(1), unless the authorization is terminated or revoked sooner.     Influenza A by PCR NEGATIVE NEGATIVE Final   Influenza B by PCR NEGATIVE NEGATIVE Final    Comment: (NOTE) The Xpert Xpress SARS-CoV-2/FLU/RSV assay is intended as an aid in  the diagnosis of influenza from Nasopharyngeal swab specimens and  should not be used as a sole basis for treatment. Nasal washings and  aspirates are unacceptable for Xpert Xpress SARS-CoV-2/FLU/RSV  testing.  Fact Sheet for Patients: PinkCheek.be  Fact Sheet for Healthcare Providers: GravelBags.it  This test is not yet approved or cleared by the [place] FDA and  has been authorized for detection and/or diagnosis of SARS-CoV-2 by  FDA under an Emergency Use Authorization (EUA). This EUA will remain  in effect (meaning this test can be used) for the duration of the  Covid-19 declaration under Section 564(b)(1) of the Act, 21  U.S.C. section 360bbb-3(b)(1), unless the authorization is  terminated or revoked. Performed at [facility], [address] [place], [place] [address]     Coagulation Studies: No results for input(s): LABPROT, INR in the last 72  hours.  Imaging: No results found.  Medications:  I have reviewed the patient's current medications. Scheduled: . atorvastatin  80 mg Oral Daily  . divalproex  750 mg Oral Q12H  . folic acid  1 mg Oral Daily  . heparin injection (subcutaneous)  5,000 Units Subcutaneous Q8H  . levETIRAcetam  500 mg Oral BID  . sodium chloride flush  3 mL Intravenous Q12H  . sodium chloride  1 g Oral TID WC  . tamsulosin  0.4 mg Oral Daily    Assessment/Plan: 59 y.o.malewith a history of seizures on Depakote and Tegretol who presented with complaints of generalized weakness. Patient found to be hyponatremic. Tegretol was discontinued and patient had a breakthrough seizure. Patient placed back on Tegretol. Patient has had intermittent issues with Tegretol in the past causing hyponatremia and other side effects.  Keppra  loaded and patient appears to be tolerating maintenance well.  Tegretol tapered to 1/2 dose on yesterday.  Recommendations: 1. Continue Keppra at 500mg  BID 2. Tegretol discontinued today 3. Seizure precautions 4. Continue to follow sodium and direct patient on continued need for sodium replacement 5. Continue Depakote at current dose 6. Patient unable to drive, operate heavy machinery, perform activities at heights and participate in water activities until release by outpatient physician.   LOS: 7 days   Alexis Goodell, MD Neurology  09/30/2020  10:54 AM

## 2020-09-30 NOTE — Progress Notes (Signed)
Pt ambulated from room around entire nursing unit with no assistance, pt tolerated very well. Pt placed back in bed with call light in reach

## 2020-09-30 NOTE — Progress Notes (Signed)
Pt asked questions about keppra dosage and side effects.  Pt instructed of adverse reaction, mild and severe.  Care notes printed out and gone over with patient in great detail.  Pt verbalized understand.

## 2020-09-30 NOTE — Progress Notes (Signed)
Report called to [place], RN and pt transported via bed w/ myself and [place], PCT to 1A room 147.  Pt meds and all belongings sent with him.  Care assumed by Alexis Goodell, RN.

## 2020-09-30 NOTE — TOC Progression Note (Signed)
Transition of Care [facility]) - Progression Note    Patient Details  Name: ASHAR LEWINSKI MRN: [id number] Date of Birth: Nov 18, 1961  Transition of Care [facility]) CM/SW [place], LCSW Phone Number: 09/30/2020, 1:53 PM  Clinical Narrative:    Patient being recommended for HH/PT at discharge. CSW made referral to [facility] with [facility], Corene Cornea accepted patient and will provide HH/PT services after discharge.    Expected Discharge Plan: [place] Barriers to Discharge: Continued Medical Work up  Expected Discharge Plan and Services Expected Discharge Plan: [place]   Discharge Planning Services: CM Consult   Living arrangements for the past 2 months: Apartment                           HH Arranged: RN, PT, OT [facility] Agency: [place] ([place]) Date [place]: 09/26/20   Representative spoke with at [place]: [place] ([place]) Interventions    Readmission Risk Interventions No flowsheet data found.

## 2020-10-01 LAB — CBC
HCT: 27 % — ABNORMAL LOW (ref 39.0–52.0)
Hemoglobin: 9.4 g/dL — ABNORMAL LOW (ref 13.0–17.0)
MCH: 33.6 pg (ref 26.0–34.0)
MCHC: 34.8 g/dL (ref 30.0–36.0)
MCV: 96.4 fL (ref 80.0–100.0)
Platelets: 306 10*3/uL (ref 150–400)
RBC: 2.8 MIL/uL — ABNORMAL LOW (ref 4.22–5.81)
RDW: 14.7 % (ref 11.5–15.5)
WBC: 6.3 10*3/uL (ref 4.0–10.5)
nRBC: 0 % (ref 0.0–0.2)

## 2020-10-01 LAB — BASIC METABOLIC PANEL
Anion gap: 6 (ref 5–15)
BUN: 31 mg/dL — ABNORMAL HIGH (ref 8–23)
CO2: 31 mmol/L (ref 22–32)
Calcium: 9.4 mg/dL (ref 8.9–10.3)
Chloride: 96 mmol/L — ABNORMAL LOW (ref 98–111)
Creatinine, Ser: 1.15 mg/dL (ref 0.61–1.24)
GFR, Estimated: >60 mL/min (ref >60)
Glucose, Bld: 88 mg/dL (ref 70–99)
Potassium: 5.3 mmol/L — ABNORMAL HIGH (ref 3.5–5.1)
Sodium: 133 mmol/L — ABNORMAL LOW (ref 135–145)

## 2020-10-01 LAB — MAGNESIUM: Magnesium: 2 mg/dL (ref 1.7–2.4)

## 2020-10-01 MED ORDER — SODIUM CHLORIDE 1 G PO TABS: 2.0000 g | ORAL_TABLET | Freq: Three times a day (TID) | ORAL | 0 refills | Status: DC

## 2020-10-01 MED ORDER — SODIUM POLYSTYRENE SULFONATE 15 GM/60ML PO SUSP
15.0000 g | Freq: Once | ORAL | Status: AC
  Administered 2020-10-01: 15 g via ORAL
  Filled 2020-10-01: qty 60

## 2020-10-01 MED ORDER — LEVETIRACETAM 500 MG PO TABS: 500.0000 mg | ORAL_TABLET | Freq: Two times a day (BID) | ORAL | 2 refills | Status: AC

## 2020-10-01 MED ORDER — TAMSULOSIN HCL 0.4 MG PO CAPS: 0.4000 mg | ORAL_CAPSULE | Freq: Every day | ORAL | Status: AC

## 2020-10-01 MED ORDER — FOLIC ACID 1 MG PO TABS: 1.0000 mg | ORAL_TABLET | Freq: Every day | ORAL | Status: DC

## 2020-10-01 MED ORDER — LISINOPRIL 5 MG PO TABS: ORAL_TABLET | ORAL | Status: AC

## 2020-10-01 MED ORDER — PROPRANOLOL HCL 20 MG PO TABS: ORAL_TABLET | ORAL | Status: DC

## 2020-10-01 MED ORDER — LEVETIRACETAM 500 MG PO TABS: 500.0000 mg | ORAL_TABLET | Freq: Two times a day (BID) | ORAL | 0 refills | Status: DC

## 2020-10-01 NOTE — Discharge Summary (Signed)
Physician Discharge Summary   [name]  male DOB: May 12, 1961  VQQ:[id number]  PCP: [name], MD  Admit date: 09/23/2020 Discharge date: 10/01/2020  Admitted From: home Disposition:  home Home Health: Yes CODE STATUS: Full code  Discharge Instructions    Discharge instructions   Complete by: As directed    You have been seen by our neurologist.  Due to your low sodium level, your Tegretol has been discontinued.  You will take Keppra instead, as directed.  Please continue to take your salt tablets as directed.  Follow up with your outpatient doctor to monitor your sodium level, and you may not need to take as much extra salt in the future after you stopped taking Tegretol.  Please follow up with your outpatient neurologist.   Dr. Enzo Bi [facility] Course:  For full details, please see H&P, progress notes, consult notes and ancillary notes.  Briefly,  [name] is a 59 y.o. male with medical history significant for HTN, seizure disorder, chronic anemia, GERD CKD 2 and chronic hyponatremia and hypoosmolality followed by nephrology and on salt tablets, recently hospitalized from 10/2-10/4 with weakness and elevated troponin ruled out for ACS, who presented to the emergency room with generalized weakness mostly in his upper extremities where he complained of feeling too weak to open his pill bottles.  Weakness 2/2 Acute on chronic hyponatremia nephrology consulted, and recommended IVF and continued home salt tablets.  Tegretol was tapered off due to it's possible effect on worsening hyponatremia (see below).  Na was 133 on the day of discharge.  Pt was discharged on salt tablets 2g TID and advised to follow up with outpatient provider to monitor Na level, as pt may not need as much salt supplements in the future as Tegretol was d/c'ed.  Seizure disorder  Tegretol was held by previous hospitlaist due to possible cause of hyponatremia, however, pt  had an episode of seizure the next day 10/12 (actually only missed 1 dose), so then it was resumed.    Per record review, pt has been taking both Tegretol and Depakote since at least 2016, and the effect of Tegretol on his hyponatremia was mentioned even back in 2016.  There had been discussion about switching pt's anti-epileptics, however, it didn't happen due to some insurance reasons.    I consulted neuro on 10/13 for rec on alternative anti-epileptics.  Pt was loaded with IV keppra and transitioned to oral keppra 500 mg BID.  Tegretol was tapered off.  Pt will continue followup with his outpatient neurologist.    Generalized weakness:  likely secondary to hyponatremia.  Weakness improved with improvement of Na level.  Pt was discharged home w/ home health   Anemia with folate def anemia workup showed normal iron level, vit B12 259, folate low at 1.6.  Pt was prescribed oral folate suppl.  HTN:  Pt was on home Lisinopril and propanolol, both were held 2/2 low normal BP.  BPH:  Continued home flomax  HLD:  continued on statin   CKDII: stable.   Thrombocytopenia: resolved    Discharge Diagnoses:  Principal Problem:   Chronic hyponatremia Active Problems:   Essential hypertension   Generalized weakness   CKD (chronic kidney disease) stage 2, GFR 60-89 ml/min   Acute hyponatremia   Chronic anemia   Seizure disorder [facility])    Discharge Instructions:  Allergies as of 10/01/2020      Reactions   Nsaids  Other (See Comments)   Kidney disease      Medication List    STOP taking these medications   carbamazepine 200 MG tablet Commonly known as: TEGRETOL     TAKE these medications   alendronate 70 MG tablet Commonly known as: FOSAMAX Take 70 mg by mouth once a week. Take with a full glass of water on an empty stomach.   atorvastatin 80 MG tablet Commonly known as: LIPITOR Take 80 mg by mouth daily.   divalproex 250 MG DR tablet Commonly known as:  DEPAKOTE Take 3 tablets (750 mg total) by mouth every 12 (twelve) hours. What changed: when to take this   feeding supplement Liqd Take 237 mLs by mouth 3 (three) times daily between meals.   folic acid 1 MG tablet Commonly known as: FOLVITE Take 1 tablet (1 mg total) by mouth daily.   levETIRAcetam 500 MG tablet Commonly known as: KEPPRA Take 1 tablet (500 mg total) by mouth 2 (two) times daily.   lisinopril 5 MG tablet Commonly known as: ZESTRIL Hold this medicine until followup with outpatient doctor because your blood pressure was normal in the hospital without it. What changed:   how much to take  how to take this  when to take this  additional instructions   propranolol 20 MG tablet Commonly known as: INDERAL Hold this medicine until followup with outpatient doctor because your heart rate was already low in the hospital without it. What changed:   how much to take  how to take this  when to take this  additional instructions   sodium chloride 1 g tablet Take 2 tablets (2 g total) by mouth 3 (three) times daily with meals. What changed: how much to take   tamsulosin 0.4 MG Caps capsule Commonly known as: FLOMAX Take 1 capsule (0.4 mg total) by mouth at bedtime.            Durable Medical Equipment  (From admission, onward)         Start     Ordered   09/26/20 1620  For home use only DME Bedside commode  Once       Question:  Patient needs a bedside commode to treat with the following condition  Answer:  Weakness   09/26/20 1620           Follow-up Information    [name], MD. Schedule an appointment as soon as possible for a visit in 1 week(s).   Specialty: Family Medicine Why: check sodium level Contact information: [address] [place] [address] [phone]        Your outpatient neurologist. Schedule an appointment as soon as possible for a visit in 2 week(s).               Allergies  Allergen Reactions  .  Nsaids Other (See Comments)    Kidney disease     The results of significant diagnostics from this hospitalization (including imaging, microbiology, ancillary and laboratory) are listed below for reference.   Consultations:   Procedures/Studies: DG Wrist Complete Left  Result Date: 09/16/2020 CLINICAL DATA:  Fall and pain EXAM: LEFT WRIST - COMPLETE 3+ VIEW COMPARISON:  None. FINDINGS: There is no evidence of fracture or dislocation. There is no evidence of arthropathy or other focal bone abnormality. Mild dorsal soft tissue swelling. IMPRESSION: Negative. Electronically Signed   By: Prudencio Pair M.D.   On: 09/16/2020 23:44   CT CHEST W CONTRAST  Result Date: 09/23/2020  CLINICAL DATA:  59 year old male with concern for lung mass. EXAM: CT CHEST WITH CONTRAST TECHNIQUE: Multidetector CT imaging of the chest was performed during intravenous contrast administration. CONTRAST:  97mL OMNIPAQUE IOHEXOL 300 MG/ML  SOLN COMPARISON:  Chest radiograph dated 09/17/2020 and CT dated 10/27/2015. FINDINGS: Cardiovascular: There is no cardiomegaly or pericardial effusion. Coronary vascular calcification involving the LAD. The thoracic aorta is unremarkable. The origins of the great vessels of the aortic arch appear patent. No pulmonary artery embolus identified. Mediastinum/Nodes: There is no hilar or mediastinal adenopathy. The esophagus and the thyroid gland are grossly unremarkable. No mediastinal fluid collection. Lungs/Pleura: There are trace bilateral pleural effusions with bibasilar subsegmental compressive atelectasis. No focal consolidation or pneumothorax. The central airways are patent. Upper Abdomen: No acute abnormality. Musculoskeletal: Degenerative changes of the spine. No acute osseous pathology. IMPRESSION: 1. No acute intrathoracic pathology.  No mass or adenopathy. 2. No CT evidence of pulmonary artery embolus. 3. Trace bilateral pleural effusions with bibasilar subsegmental compressive  atelectasis. Electronically Signed   By: Anner Crete M.D.   On: 09/23/2020 17:18   DG Chest Port 1 View  Result Date: 09/17/2020 CLINICAL DATA:  Dizziness EXAM: PORTABLE CHEST 1 VIEW COMPARISON:  06/19/2020 FINDINGS: The heart size and mediastinal contours are within normal limits. Both lungs are clear. The visualized skeletal structures are unremarkable. IMPRESSION: No active disease. Electronically Signed   By: Ulyses Jarred M.D.   On: 09/17/2020 03:56   DG Knee Complete 4 Views Left  Result Date: 09/16/2020 CLINICAL DATA:  Fall and pain EXAM: LEFT KNEE - COMPLETE 4+ VIEW COMPARISON:  None. FINDINGS: No evidence of fracture, dislocation, or joint effusion. No evidence of arthropathy or other focal bone abnormality. Soft tissues are unremarkable. IMPRESSION: Negative. Electronically Signed   By: Prudencio Pair M.D.   On: 09/16/2020 23:44   ECHOCARDIOGRAM COMPLETE  Result Date: 09/18/2020    ECHOCARDIOGRAM REPORT   Patient Name:   [name] Date of Exam: 09/17/2020 Medical Rec #:  [id number]        Height:       63.0 in Accession #:    [id number]       Weight:       110.0 lb Date of Birth:  December 07, 1961        BSA:          1.500 m Patient Age:    17 years         BP:           107/73 mmHg Patient Gender: M                HR:           68 bpm. Exam Location:  [facility] Procedure: 2D Echo Indications:     Elevated Troponin  History:         Patient has prior history of Echocardiogram examinations. Risk                  Factors:Hypertension.  Sonographer:     L Thornton-Maynard Referring Phys:  [id number] [place] Diagnosing Phys: Yolonda Kida MD  Sonographer Comments: Image acquisition challenging due to patient body habitus. IMPRESSIONS  1. Left ventricular ejection fraction, by estimation, is 60 to 65%. The left ventricle has normal function. The left ventricle has no regional wall motion abnormalities. Left ventricular diastolic parameters were normal.  2. Right ventricular systolic function is  normal. The right ventricular size is normal.  3. The mitral  valve is normal in structure. No evidence of mitral valve regurgitation.  4. The aortic valve is normal in structure. Aortic valve regurgitation is not visualized. FINDINGS  Left Ventricle: Left ventricular ejection fraction, by estimation, is 60 to 65%. The left ventricle has normal function. The left ventricle has no regional wall motion abnormalities. The left ventricular internal cavity size was normal in size. There is  no left ventricular hypertrophy. Left ventricular diastolic parameters were normal. Right Ventricle: The right ventricular size is normal. No increase in right ventricular wall thickness. Right ventricular systolic function is normal. Left Atrium: Left atrial size was normal in size. Right Atrium: Right atrial size was normal in size. Pericardium: There is no evidence of pericardial effusion. Mitral Valve: The mitral valve is normal in structure. No evidence of mitral valve regurgitation. Tricuspid Valve: The tricuspid valve is normal in structure. Tricuspid valve regurgitation is not demonstrated. Aortic Valve: The aortic valve is normal in structure. Aortic valve regurgitation is not visualized. Aortic valve mean gradient measures 3.0 mmHg. Aortic valve peak gradient measures 5.2 mmHg. Aortic valve area, by VTI measures 3.11 cm. Pulmonic Valve: The pulmonic valve was normal in structure. Pulmonic valve regurgitation is not visualized. Aorta: The ascending aorta was not well visualized. IAS/Shunts: No atrial level shunt detected by color flow Doppler.  LEFT VENTRICLE PLAX 2D LVIDd:         2.94 cm  Diastology LVIDs:         2.00 cm  LV e' medial:    8.05 cm/s LV PW:         1.18 cm  LV E/e' medial:  10.6 LV IVS:        1.04 cm  LV e' lateral:   10.60 cm/s LVOT diam:     1.90 cm  LV E/e' lateral: 8.1 LV SV:         62 LV SV Index:   41 LVOT Area:     2.84 cm  RIGHT VENTRICLE RV S prime:     12.40 cm/s LEFT ATRIUM             Index LA  diam:        3.20 cm 2.13 cm/m LA Vol (A2C):   20.3 ml 13.54 ml/m LA Vol (A4C):   18.6 ml 12.40 ml/m LA Biplane Vol: 19.6 ml 13.07 ml/m  AORTIC VALVE                   PULMONIC VALVE AV Area (Vmax):    2.76 cm    PV Vmax:       0.94 m/s AV Area (Vmean):   2.75 cm    PV Peak grad:  3.5 mmHg AV Area (VTI):     3.11 cm AV Vmax:           114.00 cm/s AV Vmean:          78.800 cm/s AV VTI:            0.199 m AV Peak Grad:      5.2 mmHg AV Mean Grad:      3.0 mmHg LVOT Vmax:         111.00 cm/s LVOT Vmean:        76.400 cm/s LVOT VTI:          0.218 m LVOT/AV VTI ratio: 1.10  AORTA Ao Root diam: 3.80 cm MITRAL VALVE MV Area (PHT): 3.12 cm    SHUNTS MV Decel Time: 243 msec  Systemic VTI:  0.22 m MV E velocity: 85.70 cm/s  Systemic Diam: 1.90 cm MV A velocity: 75.80 cm/s MV E/A ratio:  1.13 Dwayne D Callwood MD Electronically signed by Yolonda Kida MD Signature Date/Time: 09/18/2020/11:51:20 AM    Final       Labs: BNP (last 3 results) No results for input(s): BNP in the last 8760 hours. Basic Metabolic Panel: Recent Labs  Lab 09/26/20 0426 09/27/20 0650 09/27/20 0651 09/28/20 0354 09/29/20 0320 09/30/20 0344 10/01/20 0354  NA   < >  --  132* 133* 136 130* 133*  K   < >  --  4.6 4.6 4.5 4.6 5.3*  CL   < >  --  97* 98 98 94* 96*  CO2   < >  --  28 26 29 29 31   GLUCOSE   < >  --  84 92 83 86 88  BUN   < >  --  17 25* 27* 26* 31*  CREATININE   < >  --  0.97 1.18 1.07 1.13 1.15  CALCIUM   < >  --  9.0 8.9 8.8* 9.0 9.4  MG  --  2.1  --   --  1.6* 1.9 2.0   < > = values in this interval not displayed.   Liver Function Tests: No results for input(s): AST, ALT, ALKPHOS, BILITOT, PROT, ALBUMIN in the last 168 hours. No results for input(s): LIPASE, AMYLASE in the last 168 hours. No results for input(s): AMMONIA in the last 168 hours. CBC: Recent Labs  Lab 09/27/20 0651 09/28/20 0354 09/29/20 0320 09/30/20 0344 10/01/20 0354  WBC 4.3 4.4 4.6 5.1 6.3  HGB 9.7* 9.3* 8.8* 9.2* 9.4*   HCT 27.7* 27.0* 25.5* 25.8* 27.0*  MCV 96.9 96.8 97.3 95.2 96.4  PLT 210 242 248 277 306   Cardiac Enzymes: No results for input(s): CKTOTAL, CKMB, CKMBINDEX, TROPONINI in the last 168 hours. BNP: Invalid input(s): POCBNP CBG: No results for input(s): GLUCAP in the last 168 hours. D-Dimer No results for input(s): DDIMER in the last 72 hours. Hgb A1c No results for input(s): HGBA1C in the last 72 hours. Lipid Profile No results for input(s): CHOL, HDL, LDLCALC, TRIG, CHOLHDL, LDLDIRECT in the last 72 hours. Thyroid function studies No results for input(s): TSH, T4TOTAL, T3FREE, THYROIDAB in the last 72 hours.  Invalid input(s): FREET3 Anemia work up Recent Labs    09/29/20 0320  VITAMINB12 259  FOLATE 1.6*  TIBC 279  IRON 81   Urinalysis    Component Value Date/Time   COLORURINE AMBER (A) 09/16/2020 2223   APPEARANCEUR CLEAR (A) 09/16/2020 2223   APPEARANCEUR Clear 07/26/2014 1925   LABSPEC 1.025 09/16/2020 2223   LABSPEC 1.006 07/26/2014 1925   PHURINE 5.0 09/16/2020 2223   GLUCOSEU NEGATIVE 09/16/2020 2223   GLUCOSEU Negative 07/26/2014 1925   HGBUR NEGATIVE 09/16/2020 2223   BILIRUBINUR SMALL (A) 09/16/2020 2223   BILIRUBINUR Negative 07/26/2014 1925   KETONESUR 5 (A) 09/16/2020 2223   PROTEINUR NEGATIVE 09/16/2020 2223   NITRITE NEGATIVE 09/16/2020 2223   LEUKOCYTESUR NEGATIVE 09/16/2020 2223   LEUKOCYTESUR Negative 07/26/2014 1925   Sepsis Labs Invalid input(s): PROCALCITONIN,  WBC,  LACTICIDVEN Microbiology Recent Results (from the past 240 hour(s))  Respiratory Panel by RT PCR (Flu A&B, Covid) - Nasopharyngeal Swab     Status: None   Collection Time: 09/23/20  5:57 AM   Specimen: Nasopharyngeal Swab  Result Value Ref Range Status   SARS Coronavirus 2  by RT PCR NEGATIVE NEGATIVE Final    Comment: (NOTE) SARS-CoV-2 target nucleic acids are NOT DETECTED.  The SARS-CoV-2 RNA is generally detectable in upper respiratoy specimens during the acute phase  of infection. The lowest concentration of SARS-CoV-2 viral copies this assay can detect is 131 copies/mL. A negative result does not preclude SARS-Cov-2 infection and should not be used as the sole basis for treatment or other patient management decisions. A negative result may occur with  improper specimen collection/handling, submission of specimen other than nasopharyngeal swab, presence of viral mutation(s) within the areas targeted by this assay, and inadequate number of viral copies (<131 copies/mL). A negative result must be combined with clinical observations, patient history, and epidemiological information. The expected result is Negative.  Fact Sheet for Patients:  PinkCheek.be  Fact Sheet for Healthcare Providers:  GravelBags.it  This test is no t yet approved or cleared by the [place] FDA and  has been authorized for detection and/or diagnosis of SARS-CoV-2 by FDA under an Emergency Use Authorization (EUA). This EUA will remain  in effect (meaning this test can be used) for the duration of the COVID-19 declaration under Section 564(b)(1) of the Act, 21 U.S.C. section 360bbb-3(b)(1), unless the authorization is terminated or revoked sooner.     Influenza A by PCR NEGATIVE NEGATIVE Final   Influenza B by PCR NEGATIVE NEGATIVE Final    Comment: (NOTE) The Xpert Xpress SARS-CoV-2/FLU/RSV assay is intended as an aid in  the diagnosis of influenza from Nasopharyngeal swab specimens and  should not be used as a sole basis for treatment. Nasal washings and  aspirates are unacceptable for Xpert Xpress SARS-CoV-2/FLU/RSV  testing.  Fact Sheet for Patients: PinkCheek.be  Fact Sheet for Healthcare Providers: GravelBags.it  This test is not yet approved or cleared by the [place] FDA and  has been authorized for detection and/or diagnosis of  SARS-CoV-2 by  FDA under an Emergency Use Authorization (EUA). This EUA will remain  in effect (meaning this test can be used) for the duration of the  Covid-19 declaration under Section 564(b)(1) of the Act, 21  U.S.C. section 360bbb-3(b)(1), unless the authorization is  terminated or revoked. Performed at [facility], [address] [place],  [address]      Total time spend on discharging this patient, including the last patient exam, discussing the hospital stay, instructions for ongoing care as it relates to all pertinent caregivers, as well as preparing the medical discharge records, prescriptions, and/or referrals as applicable, is 50 minutes.    Enzo Bi, MD  [facility] 10/01/2020, 8:46 AM

## 2020-10-01 NOTE — Plan of Care (Signed)
  Problem: Education: Goal: Knowledge of General Education information will improve Description: Including pain rating scale, medication(s)/side effects and non-pharmacologic comfort measures Outcome: Adequate for Discharge   Problem: Health Behavior/Discharge Planning: Goal: Ability to manage health-related needs will improve Outcome: Adequate for Discharge   Problem: Clinical Measurements: Goal: Ability to maintain clinical measurements within normal limits will improve Outcome: Adequate for Discharge Goal: Will remain free from infection Outcome: Adequate for Discharge Goal: Diagnostic test results will improve Outcome: Adequate for Discharge Goal: Respiratory complications will improve Outcome: Adequate for Discharge Goal: Cardiovascular complication will be avoided Outcome: Adequate for Discharge   Problem: Activity: Goal: Risk for activity intolerance will decrease Outcome: Adequate for Discharge   Problem: Nutrition: Goal: Adequate nutrition will be maintained Outcome: Adequate for Discharge   Problem: Coping: Goal: Level of anxiety will decrease Outcome: Adequate for Discharge   Problem: Elimination: Goal: Will not experience complications related to bowel motility Outcome: Adequate for Discharge Goal: Will not experience complications related to urinary retention Outcome: Adequate for Discharge   Problem: Pain Managment: Goal: General experience of comfort will improve Outcome: Adequate for Discharge   Problem: Safety: Goal: Ability to remain free from injury will improve Outcome: Adequate for Discharge   Problem: Skin Integrity: Goal: Risk for impaired skin integrity will decrease Outcome: Adequate for Discharge   Problem: Medication: Goal: Risk for medication side effects will decrease Outcome: Adequate for Discharge   Problem: Safety: Goal: Verbalization of understanding the information provided will improve Outcome: Adequate for Discharge    Problem: Health Behavior/Discharge Planning: Goal: Compliance with prescribed medication regimen will improve Outcome: Adequate for Discharge   Problem: Safety: Goal: Verbalization of understanding the information provided will improve Outcome: Adequate for Discharge

## 2021-06-08 ENCOUNTER — Emergency Department: Payer: Medicaid Other

## 2021-06-08 ENCOUNTER — Other Ambulatory Visit: Payer: Self-pay

## 2021-06-08 ENCOUNTER — Emergency Department
Admission: EM | Admit: 2021-06-08 | Discharge: 2021-06-08 | Disposition: A | Discharge status: Home / Self Care | Payer: Medicaid Other | Attending: Emergency Medicine | Admitting: Emergency Medicine

## 2021-06-08 DIAGNOSIS — Z79899 Other long term (current) drug therapy: Secondary | ICD-10-CM | POA: Diagnosis not present

## 2021-06-08 DIAGNOSIS — R531 Weakness: Secondary | ICD-10-CM | POA: Diagnosis present

## 2021-06-08 DIAGNOSIS — Z20822 Contact with and (suspected) exposure to covid-19: Secondary | ICD-10-CM | POA: Insufficient documentation

## 2021-06-08 DIAGNOSIS — R0602 Shortness of breath: Secondary | ICD-10-CM | POA: Insufficient documentation

## 2021-06-08 DIAGNOSIS — N183 Chronic kidney disease, stage 3 unspecified: Secondary | ICD-10-CM | POA: Diagnosis not present

## 2021-06-08 DIAGNOSIS — I480 Paroxysmal atrial fibrillation: Secondary | ICD-10-CM | POA: Diagnosis not present

## 2021-06-08 DIAGNOSIS — I129 Hypertensive chronic kidney disease with stage 1 through stage 4 chronic kidney disease, or unspecified chronic kidney disease: Secondary | ICD-10-CM | POA: Diagnosis not present

## 2021-06-08 LAB — CBC WITH DIFFERENTIAL/PLATELET
Abs Immature Granulocytes: 0.02 10*3/uL (ref 0.00–0.07)
Basophils Absolute: 0 10*3/uL (ref 0.0–0.1)
Basophils Relative: 1 %
Eosinophils Absolute: 0.4 10*3/uL (ref 0.0–0.5)
Eosinophils Relative: 6 %
HCT: 33.1 % — ABNORMAL LOW (ref 39.0–52.0)
Hemoglobin: 11.2 g/dL — ABNORMAL LOW (ref 13.0–17.0)
Immature Granulocytes: 0 %
Lymphocytes Relative: 43 %
Lymphs Abs: 2.6 10*3/uL (ref 0.7–4.0)
MCH: 32.2 pg (ref 26.0–34.0)
MCHC: 33.8 g/dL (ref 30.0–36.0)
MCV: 95.1 fL (ref 80.0–100.0)
Monocytes Absolute: 0.6 10*3/uL (ref 0.1–1.0)
Monocytes Relative: 9 %
Neutro Abs: 2.5 10*3/uL (ref 1.7–7.7)
Neutrophils Relative %: 41 %
Platelets: 139 10*3/uL — ABNORMAL LOW (ref 150–400)
RBC: 3.48 MIL/uL — ABNORMAL LOW (ref 4.22–5.81)
RDW: 15.5 % (ref 11.5–15.5)
WBC: 6.1 10*3/uL (ref 4.0–10.5)
nRBC: 0 % (ref 0.0–0.2)

## 2021-06-08 LAB — RESP PANEL BY RT-PCR (FLU A&B, COVID) ARPGX2
Influenza A by PCR: NEGATIVE
Influenza B by PCR: NEGATIVE
SARS Coronavirus 2 by RT PCR: NEGATIVE

## 2021-06-08 LAB — TROPONIN I (HIGH SENSITIVITY): Troponin I (High Sensitivity): 6 ng/L (ref <18)

## 2021-06-08 LAB — BASIC METABOLIC PANEL
Anion gap: 11 (ref 5–15)
BUN: 27 mg/dL — ABNORMAL HIGH (ref 8–23)
CO2: 23 mmol/L (ref 22–32)
Calcium: 9.2 mg/dL (ref 8.9–10.3)
Chloride: 105 mmol/L (ref 98–111)
Creatinine, Ser: 1.26 mg/dL — ABNORMAL HIGH (ref 0.61–1.24)
GFR, Estimated: >60 mL/min (ref >60)
Glucose, Bld: 79 mg/dL (ref 70–99)
Potassium: 4.9 mmol/L (ref 3.5–5.1)
Sodium: 139 mmol/L (ref 135–145)

## 2021-06-08 LAB — BRAIN NATRIURETIC PEPTIDE: B Natriuretic Peptide: 98 pg/mL (ref 0.0–100.0)

## 2021-06-08 MED ORDER — DILTIAZEM HCL 25 MG/5ML IV SOLN
INTRAVENOUS | Status: AC
  Filled 2021-06-08: qty 5

## 2021-06-08 MED ORDER — DILTIAZEM HCL 25 MG/5ML IV SOLN: 5.0000 mg | Freq: Once | INTRAVENOUS | Status: DC

## 2021-06-08 NOTE — Discharge Instructions (Signed)
Follow-up with your primary care doctor.  You should also follow-up with a cardiologist.  We have provided a referral to Dr. Nehemiah Massed at the [place] clinic.  Continue take your normal medications as prescribed.  Return to the ER immediately for new, worsening, or recurrent palpitations, shortness of breath, chest pain, weakness, or any other new or worsening symptoms that concern you.

## 2021-06-08 NOTE — ED Provider Notes (Signed)
[facility] Emergency Department Provider Note ____________________________________________   Event Date/Time   First MD Initiated Contact with Patient 06/08/21 [phone]     (approximate)  I have reviewed the triage vital signs and the nursing notes.   HISTORY  Chief Complaint Weakness (Pt arrives to ED from home via [facility] c/o weakness. Patient states he felt like he was in a-fib. EMS confirmed upon arrival, pt in afib/rvr with rate of 184. Pt denies pain at this time. Hx of MI, epilepsy, CKD, HTN.)    HPI [name] is a 60 y.o. male with PMH as noted below including chronic kidney disease, hypertension, seizure disorder, as well as a history of atrial fibrillation (he states he is not on any medication for) who presents with generalized weakness, cute onset 30 minutes ago.  The patient states that he bed and awoke feeling this way.  He states he can feel his heart racing but denies any chest pain or shortness of breath.  He has not taken anything for it at home.  He also reports mild lower extremity swelling over the last several weeks.  Past Medical History:  Diagnosis Date   Anemia    GERD (gastroesophageal reflux disease)    Hypertension    Hyponatremia    chronic   Kidney disease    Migraine    Seizures ([place])     Patient Active Problem List   Diagnosis Date Noted   CKD (chronic kidney disease) stage 2, GFR 60-89 ml/min 09/23/2020   Acute hyponatremia 09/23/2020   Chronic anemia 09/23/2020   Seizure disorder ([place]) 09/23/2020   Diarrhea    Generalized weakness    Ataxia 06/19/2020   Iron deficiency anemia, unspecified    Benign neoplasm of ascending colon    Heartburn    Gastritis    Altered mental state    Acute delirium 10/31/2015   Fall    Dizziness    Laceration of scalp    Near syncope 10/27/2015   Poverty 10/27/2015   Symptomatic anemia    Early satiety    FTT (failure to thrive) in adult    Protein-calorie malnutrition,  severe (HCC) 09/07/2015   Chronic hyponatremia 09/05/2015   Elevated troponin 09/05/2015   Hypokalemia 09/05/2015   Leukopenia 09/05/2015   Renal insufficiency 09/05/2015   Tremor, essential 07/15/2015   Essential hypertension 07/15/2015   Chronic renal insufficiency 07/15/2015   Leg swelling 07/15/2015   Partial epilepsy with impairment of consciousness ([place]) 05/19/2015   Chronic kidney disease (CKD), stage III (moderate) ([place]) 05/19/2015   Absolute anemia 05/19/2015   Chest pain 04/17/2015    Past Surgical History:  Procedure Laterality Date   COLONOSCOPY WITH PROPOFOL N/A 05/01/2016   Procedure: COLONOSCOPY WITH PROPOFOL;  Surgeon: Lucilla Lame, MD;  Location: [facility] ENDOSCOPY;  Service: Endoscopy;  Laterality: N/A;   ESOPHAGOGASTRODUODENOSCOPY (EGD) WITH PROPOFOL N/A 05/01/2016   Procedure: ESOPHAGOGASTRODUODENOSCOPY (EGD) WITH PROPOFOL;  Surgeon: Lucilla Lame, MD;  Location: [facility] ENDOSCOPY;  Service: Endoscopy;  Laterality: N/A;   none      Prior to Admission medications   Medication Sig Start Date End Date Taking? Authorizing Provider  alendronate (FOSAMAX) 70 MG tablet Take 70 mg by mouth once a week. Take with a full glass of water on an empty stomach.    [provider]  atorvastatin (LIPITOR) 80 MG tablet Take 80 mg by mouth daily. 08/23/20   [provider]  divalproex (DEPAKOTE) 250 MG DR tablet Take 3 tablets (750  mg total) by mouth every 12 (twelve) hours. Patient taking differently: Take 750 mg by mouth 2 (two) times daily.  11/18/15   Vaughan Basta, MD  feeding supplement, ENSURE ENLIVE, (ENSURE ENLIVE) LIQD Take 237 mLs by mouth 3 (three) times daily between meals. 09/15/16   Hower, Aaron Mose, MD  folic acid (FOLVITE) 1 MG tablet Take 1 tablet (1 mg total) by mouth daily. 10/01/20   Enzo Bi, MD  levETIRAcetam (KEPPRA) 500 MG tablet Take 1 tablet (500 mg total) by mouth 2 (two) times daily. 10/01/20 12/30/20  Enzo Bi, MD  levETIRAcetam (KEPPRA) 500  MG tablet Take 1 tablet (500 mg total) by mouth 2 (two) times daily for 3 days. 10/01/20 10/04/20  Enzo Bi, MD  lisinopril (ZESTRIL) 5 MG tablet Hold this medicine until followup with outpatient doctor because your blood pressure was normal in the hospital without it. 10/01/20   Enzo Bi, MD  propranolol (INDERAL) 20 MG tablet Hold this medicine until followup with outpatient doctor because your heart rate was already low in the hospital without it. 10/01/20   Enzo Bi, MD  sodium chloride 1 g tablet Take 2 tablets (2 g total) by mouth 3 (three) times daily with meals. 10/01/20   Enzo Bi, MD  tamsulosin (FLOMAX) 0.4 MG CAPS capsule Take 1 capsule (0.4 mg total) by mouth at bedtime. 10/01/20   Enzo Bi, MD    Allergies Nsaids  Family History  Problem Relation Age of Onset   Heart attack Father    Kidney disease Father     Social History Social History   Tobacco Use   Smoking status: Never   Smokeless tobacco: Never  Vaping Use   Vaping Use: Never used  Substance Use Topics   Alcohol use: No   Drug use: No    Review of Systems  Constitutional: No fever.  Positive generalized weakness. Eyes: No visual changes. ENT: No sore throat. Cardiovascular: Denies chest pain. Respiratory: Denies shortness of breath. Gastrointestinal: No vomiting or diarrhea.  Genitourinary: Negative for dysuria.  Musculoskeletal: Negative for back pain. Skin: Negative for rash. Neurological: Positive for headache.   ____________________________________________   PHYSICAL EXAM:  VITAL SIGNS: ED Triage Vitals  Enc Vitals Group     BP 06/08/21 0519 110/83     Pulse --      Resp 06/08/21 0519 10     Temp 06/08/21 0519 98.1 F (36.7 C)     Temp Source 06/08/21 0519 Oral     SpO2 06/08/21 0522 99 %     Weight --      Height --      Head Circumference --      Peak Flow --      Pain Score 06/08/21 0521 0     Pain Loc --      Pain Edu? --      Excl. in [place]? --     Constitutional:  Alert and oriented.  Somewhat frail-appearing but in no acute distress. Eyes: Conjunctivae are normal.  Head: Atraumatic. Nose: No congestion/rhinnorhea. Mouth/Throat: Mucous membranes are moist.   Neck: Normal range of motion.  Cardiovascular: Tachycardic, irregular rhythm. Grossly normal heart sounds.  Good peripheral circulation. Respiratory: Normal respiratory effort.  No retractions. Lungs CTAB. Gastrointestinal: No distention.  Musculoskeletal: Trace bilateral lower extremity edema.  Extremities warm and well perfused.  Neurologic:  Normal speech and language. No gross focal neurologic deficits are appreciated.  Skin:  Skin is warm and dry. No rash noted. Psychiatric: Calm  and cooperative.  ____________________________________________   LABS (all labs ordered are listed, but only abnormal results are displayed)  Labs Reviewed  BASIC METABOLIC PANEL - Abnormal; Notable for the following components:      Result Value   BUN 27 (*)    Creatinine, Ser 1.26 (*)    All other components within normal limits  CBC WITH DIFFERENTIAL/PLATELET - Abnormal; Notable for the following components:   RBC 3.48 (*)    Hemoglobin 11.2 (*)    HCT 33.1 (*)    Platelets 139 (*)    All other components within normal limits  RESP PANEL BY RT-PCR (FLU A&B, COVID) ARPGX2  BRAIN NATRIURETIC PEPTIDE  TROPONIN I (HIGH SENSITIVITY)   ____________________________________________  EKG  ED ECG REPORT I, Arta Silence, the attending physician, personally viewed and interpreted this ECG.  Date: 06/08/2021 EKG Time: 0519 Rate: 139 Rhythm: Atrial fibrillation with RVR QRS Axis: normal Intervals: normal ST/T Wave abnormalities: normal Narrative Interpretation: no evidence of acute ischemia   ED ECG REPORT I, Arta Silence, the attending physician, personally viewed and interpreted this ECG.  Date: 06/08/2021 EKG Time: 0528 Rate: 53 Rhythm: normal sinus rhythm QRS Axis:  normal Intervals: normal ST/T Wave abnormalities: normal Narrative Interpretation: no evidence of acute ischemia    ____________________________________________  RADIOLOGY  Chest x-ray interpreted by me shows no focal consolidation or edema ____________________________________________   PROCEDURES  Procedure(s) performed: No  Procedures  Critical Care performed: No ____________________________________________   INITIAL IMPRESSION / ASSESSMENT AND PLAN / ED COURSE  Pertinent labs & imaging results that were available during my care of the patient were reviewed by me and considered in my medical decision making (see chart for details).   60 year old male with PMH as noted above including chronic kidney disease, hypertension, seizure disorder and atrial fibrillation who presents with acute onset of generalized weakness and palpitations, feeling like he is having an episode of atrial fibrillation.  I reviewed the past medical records in [place].  The patient was most recently hospitalized last October with acute on chronic hyponatremia and seizures.  I do not see a documented history of atrial fibrillation but the patient states he was diagnosed with it around that time.  On exam, the patient is somewhat chronically frail-appearing but in no acute distress.  His vital signs are normal except for tachycardia to the 130s with an irregular rhythm.  Monitor and EKG confirmed atrial fibrillation with RVR.  The physical exam is otherwise relatively unremarkable.  Neurologic exam is nonfocal.  There is trace peripheral edema.  Overall presentation is consistent with paroxysmal atrial fibrillation.  We will give IV Cardizem for rate control, obtain a chest x-ray and lab work-up to rule out new onset CHF or ACS.  Patient has no chest pain, hypoxia, or other symptoms to suggest PE.  ----------------------------------------- 6:02 AM on 06/08/2021 -----------------------------------------  Sure  after my initial exam, the patient spontaneously converted to sinus rhythm so we have not given any rate control.  We will obtain labs and monitor the patient for 1 to 2 hours.  ----------------------------------------- 7:14 AM on 06/08/2021 -----------------------------------------  The patient has had no recurrence of atrial fibrillation and is asymptomatic.  His chest x-ray and lab work-up are reassuring with no evidence of CHF.  Troponin is negative.  I discussed the case with Dr. Saralyn Pilar from cardiology.  He does not advise starting the patient on any medication at this time given that his blood pressure and heart rate are normal.  He  recommends that the patient follow-up with him in the clinic early next week.  I counseled the patient on the results of the work-up and the follow-up plan.  He is comfortable going home.  I gave him thorough return precautions and he expressed understanding.  ____________________________________________   FINAL CLINICAL IMPRESSION(S) / ED DIAGNOSES  Final diagnoses:  Paroxysmal atrial fibrillation ([place])      NEW MEDICATIONS STARTED DURING THIS VISIT:  New Prescriptions   No medications on file     Note:  This document was prepared using Dragon voice recognition software and may include unintentional dictation errors.    Arta Silence, MD 06/08/21 [phone]

## 2021-06-08 NOTE — ED Notes (Signed)
Patient converted to NSR. HR 55

## 2021-06-24 ENCOUNTER — Emergency Department
Admission: EM | Admit: 2021-06-24 | Discharge: 2021-06-24 | Disposition: A | Discharge status: Home / Self Care | Payer: Medicaid Other | Attending: Emergency Medicine | Admitting: Emergency Medicine

## 2021-06-24 ENCOUNTER — Other Ambulatory Visit: Payer: Self-pay

## 2021-06-24 DIAGNOSIS — I129 Hypertensive chronic kidney disease with stage 1 through stage 4 chronic kidney disease, or unspecified chronic kidney disease: Secondary | ICD-10-CM | POA: Diagnosis not present

## 2021-06-24 DIAGNOSIS — Z79899 Other long term (current) drug therapy: Secondary | ICD-10-CM | POA: Diagnosis not present

## 2021-06-24 DIAGNOSIS — Z23 Encounter for immunization: Secondary | ICD-10-CM | POA: Insufficient documentation

## 2021-06-24 DIAGNOSIS — Z85038 Personal history of other malignant neoplasm of large intestine: Secondary | ICD-10-CM | POA: Insufficient documentation

## 2021-06-24 DIAGNOSIS — N183 Chronic kidney disease, stage 3 unspecified: Secondary | ICD-10-CM | POA: Insufficient documentation

## 2021-06-24 DIAGNOSIS — S61211A Laceration without foreign body of left index finger without damage to nail, initial encounter: Secondary | ICD-10-CM | POA: Diagnosis not present

## 2021-06-24 DIAGNOSIS — W260XXA Contact with knife, initial encounter: Secondary | ICD-10-CM | POA: Insufficient documentation

## 2021-06-24 DIAGNOSIS — S6992XA Unspecified injury of left wrist, hand and finger(s), initial encounter: Secondary | ICD-10-CM | POA: Diagnosis present

## 2021-06-24 MED ORDER — SULFAMETHOXAZOLE-TRIMETHOPRIM 400-80 MG PO TABS: 1.0000 | ORAL_TABLET | Freq: Two times a day (BID) | ORAL | 0 refills | Status: DC

## 2021-06-24 MED ORDER — TETANUS-DIPHTH-ACELL PERTUSSIS 5-2.5-18.5 LF-MCG/0.5 IM SUSY
0.5000 mL | PREFILLED_SYRINGE | Freq: Once | INTRAMUSCULAR | Status: AC
  Administered 2021-06-24: 0.5 mL via INTRAMUSCULAR
  Filled 2021-06-24: qty 0.5

## 2021-06-24 MED ORDER — SULFAMETHOXAZOLE-TRIMETHOPRIM 800-160 MG PO TABS
1.0000 | ORAL_TABLET | Freq: Once | ORAL | Status: AC
  Administered 2021-06-24: 1 via ORAL
  Filled 2021-06-24: qty 1

## 2021-06-24 NOTE — ED Triage Notes (Signed)
Pt brought to ED via [facility] with complaints of index finger laceration. EMS got bleeding controlled but pt felt that he was loosing a lot of blood and wanted to be seen.
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
## 2021-06-24 NOTE — Discharge Instructions (Addendum)
You were seen today for a laceration of your left index finger.  This was repaired with tissue adhesive.  You received a tetanus injection today.  Please pick up your antibiotics and take twice daily for the next 7 days.  You may take the splint off after 4 days.  Please do not peel the glue off, let this come off with time.  Follow-up with your PCP as needed.

## 2021-11-26 ENCOUNTER — Emergency Department
Admission: EM | Admit: 2021-11-26 | Discharge: 2021-11-26 | Disposition: A | Discharge status: Home / Self Care | Payer: Medicaid Other | Attending: Emergency Medicine | Admitting: Emergency Medicine

## 2021-11-26 ENCOUNTER — Other Ambulatory Visit: Payer: Self-pay

## 2021-11-26 ENCOUNTER — Emergency Department: Payer: Medicaid Other

## 2021-11-26 DIAGNOSIS — Z20822 Contact with and (suspected) exposure to covid-19: Secondary | ICD-10-CM | POA: Insufficient documentation

## 2021-11-26 DIAGNOSIS — N183 Chronic kidney disease, stage 3 unspecified: Secondary | ICD-10-CM | POA: Insufficient documentation

## 2021-11-26 DIAGNOSIS — I129 Hypertensive chronic kidney disease with stage 1 through stage 4 chronic kidney disease, or unspecified chronic kidney disease: Secondary | ICD-10-CM | POA: Diagnosis not present

## 2021-11-26 DIAGNOSIS — J40 Bronchitis, not specified as acute or chronic: Secondary | ICD-10-CM | POA: Insufficient documentation

## 2021-11-26 DIAGNOSIS — Z79899 Other long term (current) drug therapy: Secondary | ICD-10-CM | POA: Insufficient documentation

## 2021-11-26 DIAGNOSIS — R0602 Shortness of breath: Secondary | ICD-10-CM

## 2021-11-26 LAB — CBC WITH DIFFERENTIAL/PLATELET
Abs Immature Granulocytes: 0.01 10*3/uL (ref 0.00–0.07)
Basophils Absolute: 0 10*3/uL (ref 0.0–0.1)
Basophils Relative: 1 %
Eosinophils Absolute: 0.3 10*3/uL (ref 0.0–0.5)
Eosinophils Relative: 5 %
HCT: 35 % — ABNORMAL LOW (ref 39.0–52.0)
Hemoglobin: 11.6 g/dL — ABNORMAL LOW (ref 13.0–17.0)
Immature Granulocytes: 0 %
Lymphocytes Relative: 46 %
Lymphs Abs: 2.6 10*3/uL (ref 0.7–4.0)
MCH: 33 pg (ref 26.0–34.0)
MCHC: 33.1 g/dL (ref 30.0–36.0)
MCV: 99.4 fL (ref 80.0–100.0)
Monocytes Absolute: 0.6 10*3/uL (ref 0.1–1.0)
Monocytes Relative: 10 %
Neutro Abs: 2.1 10*3/uL (ref 1.7–7.7)
Neutrophils Relative %: 38 %
Platelets: 133 10*3/uL — ABNORMAL LOW (ref 150–400)
RBC: 3.52 MIL/uL — ABNORMAL LOW (ref 4.22–5.81)
RDW: 14.3 % (ref 11.5–15.5)
WBC: 5.7 10*3/uL (ref 4.0–10.5)
nRBC: 0 % (ref 0.0–0.2)

## 2021-11-26 LAB — RESP PANEL BY RT-PCR (FLU A&B, COVID) ARPGX2
Influenza A by PCR: NEGATIVE
Influenza B by PCR: NEGATIVE
SARS Coronavirus 2 by RT PCR: NEGATIVE

## 2021-11-26 LAB — COMPREHENSIVE METABOLIC PANEL
ALT: 22 U/L (ref 0–44)
AST: 32 U/L (ref 15–41)
Albumin: 3.8 g/dL (ref 3.5–5.0)
Alkaline Phosphatase: 59 U/L (ref 38–126)
Anion gap: 8 (ref 5–15)
BUN: 36 mg/dL — ABNORMAL HIGH (ref 8–23)
CO2: 24 mmol/L (ref 22–32)
Calcium: 9.2 mg/dL (ref 8.9–10.3)
Chloride: 104 mmol/L (ref 98–111)
Creatinine, Ser: 0.96 mg/dL (ref 0.61–1.24)
GFR, Estimated: >60 mL/min (ref >60)
Glucose, Bld: 78 mg/dL (ref 70–99)
Potassium: 4.3 mmol/L (ref 3.5–5.1)
Sodium: 136 mmol/L (ref 135–145)
Total Bilirubin: 0.6 mg/dL (ref 0.3–1.2)
Total Protein: 6.5 g/dL (ref 6.5–8.1)

## 2021-11-26 LAB — VALPROIC ACID LEVEL: Valproic Acid Lvl: 60 ug/mL (ref 50.0–100.0)

## 2021-11-26 LAB — TROPONIN I (HIGH SENSITIVITY): Troponin I (High Sensitivity): 5 ng/L (ref <18)

## 2021-11-26 NOTE — ED Notes (Signed)
Per Dr's request this tech walked pt down the hall while on the pulse ox monitor. O2 sats stayed at 100.

## 2021-11-26 NOTE — ED Triage Notes (Signed)
Pt in via EMS from home with c/o SOB for the past 2-3 days and chills. Pt concerned he may have covid. 57 HR, 100% RA, 115/85

## 2021-11-26 NOTE — ED Triage Notes (Signed)
Patient c/o SOB X 3 days. Patient reports accompanying symptoms of nasal congestion and chills.

## 2021-11-26 NOTE — ED Notes (Signed)
Pt to ED c/o weakness and SOB since last 2-3 days. States that yesterday he was not able to walk the normal 1/3 mile that he walks. Pt also states that he can "hear his L carotid pulse in his ear" and usually can only hear this at night. Obtaining VS. SPO2 is 100% on RA and lungs sound clear.

## 2021-11-26 NOTE — ED Provider Notes (Signed)
[facility] Emergency Department Provider Note   ____________________________________________   Event Date/Time   First MD Initiated Contact with Patient 11/26/21 1556     (approximate)  I have reviewed the triage vital signs and the nursing notes.   HISTORY  Chief Complaint Shortness of Breath    HPI [name] is a 60 y.o. male with past medical history of hypertension, CKD, anemia, seizures, and atrial fibrillation not on anticoagulation who presents to the ED complaining of shortness of breath.  Patient reports that he has had a couple of days of cough, congestion, and difficulty breathing.  He describes the shortness of breath as constant and not exacerbated or alleviated by anything.  He has felt feverish but has not taken his temperature at home, denies any pain in his chest.  He does state that he felt much weaker than usual when he went to get up this morning, to the point that he was having difficulty walking.  He denies any nausea, vomiting, abdominal pain, dysuria, or diarrhea.  He is not aware of any recent sick contacts.        Past Medical History:  Diagnosis Date   Anemia    GERD (gastroesophageal reflux disease)    Hypertension    Hyponatremia    chronic   Kidney disease    Migraine    Seizures ([place])     Patient Active Problem List   Diagnosis Date Noted   CKD (chronic kidney disease) stage 2, GFR 60-89 ml/min 09/23/2020   Acute hyponatremia 09/23/2020   Chronic anemia 09/23/2020   Seizure disorder ([place]) 09/23/2020   Diarrhea    Generalized weakness    Ataxia 06/19/2020   Iron deficiency anemia, unspecified    Benign neoplasm of ascending colon    Heartburn    Gastritis    Altered mental state    Acute delirium 10/31/2015   Fall    Dizziness    Laceration of scalp    Near syncope 10/27/2015   Poverty 10/27/2015   Symptomatic anemia    Early satiety    FTT (failure to thrive) in adult    Protein-calorie  malnutrition, severe (HCC) 09/07/2015   Chronic hyponatremia 09/05/2015   Elevated troponin 09/05/2015   Hypokalemia 09/05/2015   Leukopenia 09/05/2015   Renal insufficiency 09/05/2015   Tremor, essential 07/15/2015   Essential hypertension 07/15/2015   Chronic renal insufficiency 07/15/2015   Leg swelling 07/15/2015   Partial epilepsy with impairment of consciousness ([place]) 05/19/2015   Chronic kidney disease (CKD), stage III (moderate) ([place]) 05/19/2015   Absolute anemia 05/19/2015   Chest pain 04/17/2015    Past Surgical History:  Procedure Laterality Date   COLONOSCOPY WITH PROPOFOL N/A 05/01/2016   Procedure: COLONOSCOPY WITH PROPOFOL;  Surgeon: Lucilla Lame, MD;  Location: [facility] ENDOSCOPY;  Service: Endoscopy;  Laterality: N/A;   ESOPHAGOGASTRODUODENOSCOPY (EGD) WITH PROPOFOL N/A 05/01/2016   Procedure: ESOPHAGOGASTRODUODENOSCOPY (EGD) WITH PROPOFOL;  Surgeon: Lucilla Lame, MD;  Location: [facility] ENDOSCOPY;  Service: Endoscopy;  Laterality: N/A;   none      Prior to Admission medications   Medication Sig Start Date End Date Taking? Authorizing Provider  alendronate (FOSAMAX) 70 MG tablet Take 70 mg by mouth once a week. Take with a full glass of water on an empty stomach.    [provider]  atorvastatin (LIPITOR) 80 MG tablet Take 80 mg by mouth daily. 08/23/20   [provider]  divalproex (DEPAKOTE) 250 MG DR tablet Take 3  tablets (750 mg total) by mouth every 12 (twelve) hours. Patient taking differently: Take 750 mg by mouth 2 (two) times daily.  11/18/15   Vaughan Basta, MD  feeding supplement, ENSURE ENLIVE, (ENSURE ENLIVE) LIQD Take 237 mLs by mouth 3 (three) times daily between meals. 09/15/16   Hower, Aaron Mose, MD  folic acid (FOLVITE) 1 MG tablet Take 1 tablet (1 mg total) by mouth daily. 10/01/20   Enzo Bi, MD  levETIRAcetam (KEPPRA) 500 MG tablet Take 1 tablet (500 mg total) by mouth 2 (two) times daily. 10/01/20 12/30/20  Enzo Bi, MD  levETIRAcetam  (KEPPRA) 500 MG tablet Take 1 tablet (500 mg total) by mouth 2 (two) times daily for 3 days. 10/01/20 10/04/20  Enzo Bi, MD  lisinopril (ZESTRIL) 5 MG tablet Hold this medicine until followup with outpatient doctor because your blood pressure was normal in the hospital without it. 10/01/20   Enzo Bi, MD  propranolol (INDERAL) 20 MG tablet Hold this medicine until followup with outpatient doctor because your heart rate was already low in the hospital without it. 10/01/20   Enzo Bi, MD  sodium chloride 1 g tablet Take 2 tablets (2 g total) by mouth 3 (three) times daily with meals. 10/01/20   Enzo Bi, MD  sulfamethoxazole-trimethoprim (BACTRIM) 400-80 MG tablet Take 1 tablet by mouth 2 (two) times daily. 06/24/21   Jearld Fenton, NP  tamsulosin (FLOMAX) 0.4 MG CAPS capsule Take 1 capsule (0.4 mg total) by mouth at bedtime. 10/01/20   Enzo Bi, MD    Allergies Nsaids  Family History  Problem Relation Age of Onset   Heart attack Father    Kidney disease Father     Social History Social History   Tobacco Use   Smoking status: Never   Smokeless tobacco: Never  Vaping Use   Vaping Use: Never used  Substance Use Topics   Alcohol use: No   Drug use: No    Review of Systems  Constitutional: No fever/chills.  Positive for generalized weakness. Eyes: No visual changes. ENT: No sore throat.  Positive for congestion. Cardiovascular: Denies chest pain. Respiratory: Positive for cough and shortness of breath. Gastrointestinal: No abdominal pain.  No nausea, no vomiting.  No diarrhea.  No constipation. Genitourinary: Negative for dysuria. Musculoskeletal: Negative for back pain. Skin: Negative for rash. Neurological: Negative for headaches, focal weakness or numbness.  ____________________________________________   PHYSICAL EXAM:  VITAL SIGNS: ED Triage Vitals  Enc Vitals Group     BP 11/26/21 0914 115/68     Pulse Rate 11/26/21 0914 (!) 55     Resp 11/26/21 0914 15      Temp 11/26/21 0914 98.4 F (36.9 C)     Temp Source 11/26/21 0914 Oral     SpO2 11/26/21 0914 100 %     Weight 11/26/21 0915 105 lb (47.6 kg)     Height 11/26/21 0915 5\' 3"  (1.6 m)     Head Circumference --      Peak Flow --      Pain Score 11/26/21 0914 5     Pain Loc --      Pain Edu? --      Excl. in [place]? --     Constitutional: Alert and oriented. Eyes: Conjunctivae are normal. Head: Atraumatic. Nose: No congestion/rhinnorhea. Mouth/Throat: Mucous membranes are moist. Neck: Normal ROM Cardiovascular: Normal rate, regular rhythm. Grossly normal heart sounds.  2+ radial pulses bilaterally. Respiratory: Normal respiratory effort.  No retractions. Lungs CTAB. Gastrointestinal:  Soft and nontender. No distention. Genitourinary: deferred Musculoskeletal: No lower extremity tenderness nor edema. Neurologic:  Normal speech and language. No gross focal neurologic deficits are appreciated. Skin:  Skin is warm, dry and intact. No rash noted. Psychiatric: Mood and affect are normal. Speech and behavior are normal.  ____________________________________________   LABS (all labs ordered are listed, but only abnormal results are displayed)  Labs Reviewed  CBC WITH DIFFERENTIAL/PLATELET - Abnormal; Notable for the following components:      Result Value   RBC 3.52 (*)    Hemoglobin 11.6 (*)    HCT 35.0 (*)    Platelets 133 (*)    All other components within normal limits  COMPREHENSIVE METABOLIC PANEL - Abnormal; Notable for the following components:   BUN 36 (*)    All other components within normal limits  RESP PANEL BY RT-PCR (FLU A&B, COVID) ARPGX2  VALPROIC ACID LEVEL  TROPONIN I (HIGH SENSITIVITY)   ____________________________________________  EKG  ED ECG REPORT I, Blake Divine, the attending physician, personally viewed and interpreted this ECG.   Date: 11/26/2021  EKG Time: 9:23  Rate: 55  Rhythm: sinus bradycardia  Axis: RAD  Intervals:none  ST&T Change:  None    PROCEDURES  Procedure(s) performed (including Critical Care):  Procedures   ____________________________________________   INITIAL IMPRESSION / ASSESSMENT AND PLAN / ED COURSE      60 year old male with past medical history of hypertension, hyperlipidemia, CKD, anemia, seizures, and atrial fibrillation not on anticoagulation who presents to the ED complaining of 2 to 3 days of cough, congestion, shortness of breath, and generalized weakness.  Patient's vital signs are reassuring, he is afebrile here in the ED and not in any respiratory distress, maintaining O2 sats on room air.  EKG shows no evidence of arrhythmia or ischemia, patient noted to be in sinus bradycardia which is typical for him.  Labs are unremarkable, no electrolyte abnormality or AKI noted.  Chest x-ray reviewed by me and shows no infiltrate, edema, or effusion.  Testing for COVID-19 and influenza is negative however symptoms do sound most consistent with other viral syndrome.  We will add on troponin given his shortness of breath, also check Depakote level in case this is contributing to his generalized weakness.  Troponin is within normal limits, no findings to suggest ACS or PE at this time.  Depakote level is also therapeutic.  Patient is ambulatory here in the ED without difficulty and maintains O2 sats on room air when doing so.  He is appropriate for discharge home with PCP follow-up, suspect symptoms are related to a bronchitis given his cough and congestion.  He was counseled to return to the ED for new worsening symptoms, patient agrees with plan.      ____________________________________________   FINAL CLINICAL IMPRESSION(S) / ED DIAGNOSES  Final diagnoses:  Bronchitis  Shortness of breath     ED Discharge Orders     None        Note:  This document was prepared using Dragon voice recognition software and may include unintentional dictation errors.    Blake Divine, MD 11/26/21  [phone]

## 2021-12-17 ENCOUNTER — Emergency Department
Admission: EM | Admit: 2021-12-17 | Discharge: 2021-12-18 | Disposition: A | Discharge status: Home / Self Care | Payer: Medicaid Other | Attending: Emergency Medicine | Admitting: Emergency Medicine

## 2021-12-17 ENCOUNTER — Other Ambulatory Visit: Payer: Self-pay

## 2021-12-17 ENCOUNTER — Emergency Department: Payer: Medicaid Other

## 2021-12-17 DIAGNOSIS — U071 COVID-19: Secondary | ICD-10-CM | POA: Insufficient documentation

## 2021-12-17 DIAGNOSIS — R059 Cough, unspecified: Secondary | ICD-10-CM

## 2021-12-17 LAB — RESP PANEL BY RT-PCR (FLU A&B, COVID) ARPGX2
Influenza A by PCR: NEGATIVE
Influenza B by PCR: NEGATIVE
SARS Coronavirus 2 by RT PCR: POSITIVE — AB

## 2021-12-17 MED ORDER — ONDANSETRON 4 MG PO TBDP: 4.0000 mg | ORAL_TABLET | Freq: Three times a day (TID) | ORAL | 0 refills | Status: DC | PRN

## 2021-12-17 MED ORDER — PREDNISONE 50 MG PO TABS: 50.0000 mg | ORAL_TABLET | Freq: Every day | ORAL | 0 refills | Status: DC

## 2021-12-17 MED ORDER — PSEUDOEPH-BROMPHEN-DM 30-2-10 MG/5ML PO SYRP: 10.0000 mL | ORAL_SOLUTION | Freq: Four times a day (QID) | ORAL | 0 refills | Status: DC | PRN

## 2021-12-17 MED ORDER — BENZONATATE 100 MG PO CAPS: 100.0000 mg | ORAL_CAPSULE | Freq: Three times a day (TID) | ORAL | 0 refills | Status: DC | PRN

## 2021-12-17 MED ORDER — PREDNISONE 20 MG PO TABS
60.0000 mg | ORAL_TABLET | Freq: Once | ORAL | Status: AC
  Administered 2021-12-17: 60 mg via ORAL
  Filled 2021-12-17: qty 3

## 2021-12-17 MED ORDER — BENZONATATE 100 MG PO CAPS: 100.0000 mg | ORAL_CAPSULE | Freq: Three times a day (TID) | ORAL | 0 refills | Status: AC | PRN

## 2021-12-17 NOTE — ED Provider Notes (Signed)
[facility] Provider Note  Patient Contact: 9:45 PM (approximate)   History   Cough   HPI  [name] is a 61 y.o. male who presents to the emergency department looking for treatment for COVID-19.  Patient started to feel ill yesterday, took an at-home COVID test which was positive.  Patient is concerned as he has chronic medical issues including chronic renal insufficiency, anemia, epilepsy.  Patient is looking for treatment for symptoms at this time.  No fevers or chills, difficulty breathing, chest pain, emesis, diarrhea or constipation.  Patient has congestion, sore throat, cough and malaise.  Patient was seen in the ED in December and diagnosed with bronchitis.  Patient states that he has chronic kidney disease but a review of the medical record reveals that his last visit with his nephrologist patient's creatinine was less than 1 and his GFR was over 60.     Physical Exam   Triage Vital Signs: ED Triage Vitals  Enc Vitals Group     BP 12/17/21 1947 123/71     Pulse Rate 12/17/21 1947 70     Resp 12/17/21 1947 18     Temp 12/17/21 1947 98.3 F (36.8 C)     Temp Source 12/17/21 1947 Oral     SpO2 12/17/21 1947 100 %     Weight 12/17/21 1950 95 lb (43.1 kg)     Height 12/17/21 1950 5\' 3"  (1.6 m)     Head Circumference --      Peak Flow --      Pain Score --      Pain Loc --      Pain Edu? --      Excl. in [place]? --     Most recent vital signs: Vitals:   12/17/21 1947  BP: 123/71  Pulse: 70  Resp: 18  Temp: 98.3 F (36.8 C)  SpO2: 100%     General: Alert and in no acute distress. ENT:      Ears:       Nose: No congestion/rhinnorhea.      Mouth/Throat: Mucous membranes are moist.  No gross oropharyngeal erythema or edema.  No exudates. Neck: No stridor. No cervical spine tenderness to palpation. Hematological/Lymphatic/Immunilogical: No cervical lymphadenopathy. Cardiovascular:  Good peripheral perfusion Respiratory: Normal  respiratory effort without tachypnea or retractions. Lungs CTAB. Good air entry to the bases with no decreased or absent breath sounds. Musculoskeletal: Full range of motion to all extremities.  Neurologic:  No gross focal neurologic deficits are appreciated.  Skin:   No rash noted Other:   ED Results / Procedures / Treatments   Labs (all labs ordered are listed, but only abnormal results are displayed) Labs Reviewed  RESP PANEL BY RT-PCR (FLU A&B, COVID) ARPGX2 - Abnormal; Notable for the following components:      Result Value   SARS Coronavirus 2 by RT PCR POSITIVE (*)    All other components within normal limits     EKG     RADIOLOGY  I personally viewed and evaluated these images as part of my medical decision making, as well as reviewing the written report by the radiologist.  ED Provider Interpretation: No acute cardiopulmonary findings on chest x-ray   PROCEDURES:  Critical Care performed: No  Procedures   MEDICATIONS ORDERED IN ED: Medications - No data to display   IMPRESSION / MDM / [place] / ED COURSE  I reviewed the triage vital signs and the nursing notes.  Differential diagnosis includes, but is not limited to, influenza, COVID, viral URI.    Patient's diagnosis is consistent with COVID-19.  Patient presented to the emergency department complaining of COVID symptoms and a positive home test for COVID.  Patient would like treatment for same.  We had a lengthy discussion after patient had a positive COVID test here in the emergency department about treatment options.  This included antiviral, packs fluid versus symptom control medications.  Patient would prefer to be treated with symptom control medications over the antiviral.  I feel this is reasonable.  As mentioned above, patient has a history of chronic kidney disease though his recent labs for kidney function are reassuring with creatinine less than 1 and GFR  over 60.  This did not plan to the decision of the patient's antiviral choice.  At this time I will cover with steroid, cough medications and Zofran should he developed any GI complaints.  Return precautions discussed at length with the patient to include chest pain, shortness of breath, inability to maintain good oral hydration.  Patient should follow-up with primary care.  Patient is given ED precautions to return to the ED for any worsening or new symptoms.       FINAL CLINICAL IMPRESSION(S) / ED DIAGNOSES   Final diagnoses:  Cough  COVID-19     Rx / DC Orders   ED Discharge Orders          Ordered    predniSONE (DELTASONE) 50 MG tablet  Daily with breakfast        12/17/21 2209    ondansetron (ZOFRAN-ODT) 4 MG disintegrating tablet  Every 8 hours PRN        12/17/21 2209    benzonatate (TESSALON PERLES) 100 MG capsule  3 times daily PRN        12/17/21 2209    brompheniramine-pseudoephedrine-DM 30-2-10 MG/5ML syrup  4 times daily PRN        12/17/21 2209             Note:  This document was prepared using Dragon voice recognition software and may include unintentional dictation errors.   Darletta Moll, PA-C 12/17/21 2210    Delman Kitten, MD 12/17/21 2326

## 2021-12-17 NOTE — ED Triage Notes (Signed)
Pt presents to ER via ems with c/o COVID + home test today.  Pt states he has been coughing and sneezing since yesterday and has had a decreased appetite.  Pt coming to ER for treatment for his COVID.  Pt states he needs the treatment because he has multiple comorbidities.  Pt A&Ox4 at this time.  Respirations even and unlabored.  Pt speaking in complete sentences.

## 2021-12-18 NOTE — ED Notes (Signed)
Pt attempted to call cab for ride. No cab available

## 2021-12-18 NOTE — ED Notes (Signed)
Pt taken to waiting room. Pt still looking for ride

## 2021-12-18 NOTE — ED Notes (Signed)
Pt attempted to call friend listed in chart, no answer

## 2022-05-04 ENCOUNTER — Other Ambulatory Visit: Payer: Self-pay

## 2022-05-04 DIAGNOSIS — Z86010 Personal history of colonic polyps: Secondary | ICD-10-CM

## 2022-05-04 MED ORDER — PEG 3350-KCL-NA BICARB-NACL 420 G PO SOLR: 4000.0000 mL | Freq: Once | ORAL | 0 refills | Status: AC

## 2022-05-04 NOTE — Progress Notes (Signed)
Gastroenterology Pre-Procedure Review Patient will bring insurance card by to be scanned  Request Date: 06/26/2022 Requesting Physician: Dr. Allen Norris  PATIENT REVIEW QUESTIONS: The patient responded to the following health history questions as indicated:    1. Are you having any GI issues? no 2. Do you have a personal history of Polyps? yes (last colonoscopy ) 3. Do you have a family history of Colon Cancer or Polyps? no 4. Diabetes Mellitus? no 5. Joint replacements in the past 12 months?no 6. Major health problems in the past 3 months?no 7. Any artificial heart valves, MVP, or defibrillator?no    MEDICATIONS & ALLERGIES:    Patient reports the following regarding taking any anticoagulation/antiplatelet therapy:   Plavix, Coumadin, Eliquis, Xarelto, Lovenox, Pradaxa, Brilinta, or Effient? no Aspirin? no  Patient confirms/reports the following medications:  Current Outpatient Medications  Medication Sig Dispense Refill   alendronate (FOSAMAX) 70 MG tablet Take 70 mg by mouth once a week. Take with a full glass of water on an empty stomach.     atorvastatin (LIPITOR) 80 MG tablet Take 80 mg by mouth daily.     benzonatate (TESSALON PERLES) 100 MG capsule Take 1 capsule (100 mg total) by mouth 3 (three) times daily as needed for cough. 30 capsule 0   brompheniramine-pseudoephedrine-DM 30-2-10 MG/5ML syrup Take 10 mLs by mouth 4 (four) times daily as needed. 200 mL 0   divalproex (DEPAKOTE) 250 MG DR tablet Take 3 tablets (750 mg total) by mouth every 12 (twelve) hours. (Patient taking differently: Take 750 mg by mouth 2 (two) times daily. ) 100 tablet 1   feeding supplement, ENSURE ENLIVE, (ENSURE ENLIVE) LIQD Take 237 mLs by mouth 3 (three) times daily between meals. 329 mL 12   folic acid (FOLVITE) 1 MG tablet Take 1 tablet (1 mg total) by mouth daily.     levETIRAcetam (KEPPRA) 500 MG tablet Take 1 tablet (500 mg total) by mouth 2 (two) times daily. 60 tablet 2   levETIRAcetam (KEPPRA)  500 MG tablet Take 1 tablet (500 mg total) by mouth 2 (two) times daily for 3 days. 6 tablet 0   lisinopril (ZESTRIL) 5 MG tablet Hold this medicine until followup with outpatient doctor because your blood pressure was normal in the hospital without it.     ondansetron (ZOFRAN-ODT) 4 MG disintegrating tablet Take 1 tablet (4 mg total) by mouth every 8 (eight) hours as needed for nausea or vomiting. 20 tablet 0   predniSONE (DELTASONE) 50 MG tablet Take 1 tablet (50 mg total) by mouth daily with breakfast. 5 tablet 0   propranolol (INDERAL) 20 MG tablet Hold this medicine until followup with outpatient doctor because your heart rate was already low in the hospital without it.     sodium chloride 1 g tablet Take 2 tablets (2 g total) by mouth 3 (three) times daily with meals. 90 tablet 0   sulfamethoxazole-trimethoprim (BACTRIM) 400-80 MG tablet Take 1 tablet by mouth 2 (two) times daily. 14 tablet 0   tamsulosin (FLOMAX) 0.4 MG CAPS capsule Take 1 capsule (0.4 mg total) by mouth at bedtime.     No current facility-administered medications for this visit.    Patient confirms/reports the following allergies:  Allergies  Allergen Reactions   Nsaids Other (See Comments)    Kidney disease    No orders of the defined types were placed in this encounter.   AUTHORIZATION INFORMATION Primary Insurance: 1D#: Group #:  Secondary Insurance: 1D#: Group #:  SCHEDULE INFORMATION: Date:  06/26/2022 Time: Location: [facility]

## 2022-05-18 MED ORDER — PEG 3350-KCL-NABCB-NACL-NASULF 236 G PO SOLR: 4000.0000 mL | Freq: Once | ORAL | 0 refills | Status: AC

## 2022-05-18 NOTE — Addendum Note (Signed)
Addended by: Lurlean Nanny on: 05/18/2022 09:20 AM   Modules accepted: Orders

## 2022-06-26 ENCOUNTER — Ambulatory Visit: Payer: Medicaid Other | Admitting: Anesthesiology

## 2022-06-26 ENCOUNTER — Ambulatory Visit
Admission: RE | Admit: 2022-06-26 | Discharge: 2022-06-26 | Disposition: A | Discharge status: Home / Self Care | Payer: Medicaid Other | Attending: Gastroenterology | Admitting: Gastroenterology

## 2022-06-26 ENCOUNTER — Other Ambulatory Visit: Payer: Self-pay

## 2022-06-26 ENCOUNTER — Encounter: Payer: Self-pay | Admitting: Gastroenterology

## 2022-06-26 ENCOUNTER — Encounter
Admission: RE | Disposition: A | Discharge status: Home / Self Care | Payer: Self-pay | Source: Home / Self Care | Attending: Gastroenterology

## 2022-06-26 DIAGNOSIS — Z1211 Encounter for screening for malignant neoplasm of colon: Secondary | ICD-10-CM | POA: Diagnosis present

## 2022-06-26 DIAGNOSIS — K635 Polyp of colon: Secondary | ICD-10-CM

## 2022-06-26 DIAGNOSIS — K640 First degree hemorrhoids: Secondary | ICD-10-CM | POA: Diagnosis not present

## 2022-06-26 DIAGNOSIS — R569 Unspecified convulsions: Secondary | ICD-10-CM | POA: Diagnosis not present

## 2022-06-26 DIAGNOSIS — D123 Benign neoplasm of transverse colon: Secondary | ICD-10-CM | POA: Diagnosis not present

## 2022-06-26 DIAGNOSIS — Z860100 Personal history of colon polyps, unspecified: Secondary | ICD-10-CM

## 2022-06-26 DIAGNOSIS — N289 Disorder of kidney and ureter, unspecified: Secondary | ICD-10-CM | POA: Insufficient documentation

## 2022-06-26 HISTORY — PX: COLONOSCOPY WITH PROPOFOL: SHX5780

## 2022-06-26 SURGERY — COLONOSCOPY WITH PROPOFOL
Anesthesia: General

## 2022-06-26 MED ORDER — GLYCOPYRROLATE 0.2 MG/ML IJ SOLN
INTRAMUSCULAR | Status: DC | PRN
  Administered 2022-06-26: .2 mg via INTRAVENOUS

## 2022-06-26 MED ORDER — PROPOFOL 10 MG/ML IV BOLUS
INTRAVENOUS | Status: DC | PRN
  Administered 2022-06-26: 10 mg via INTRAVENOUS
  Administered 2022-06-26: 60 mg via INTRAVENOUS

## 2022-06-26 MED ORDER — PROPOFOL 500 MG/50ML IV EMUL
INTRAVENOUS | Status: DC | PRN
  Administered 2022-06-26: 155 ug/kg/min via INTRAVENOUS

## 2022-06-26 MED ORDER — LIDOCAINE HCL (CARDIAC) PF 100 MG/5ML IV SOSY
PREFILLED_SYRINGE | INTRAVENOUS | Status: DC | PRN
  Administered 2022-06-26: 100 mg via INTRAVENOUS

## 2022-06-26 MED ORDER — SODIUM CHLORIDE 0.9 % IV SOLN: INTRAVENOUS | Status: DC

## 2022-06-26 NOTE — Anesthesia Preprocedure Evaluation (Addendum)
Anesthesia Evaluation  Patient identified by MRN, date of birth, ID band Patient awake    Reviewed: Allergy & Precautions, NPO status , Patient's Chart, lab work & pertinent test results  History of Anesthesia Complications Negative for: history of anesthetic complications  Airway Mallampati: III  TM Distance: <3 FB Neck ROM: full    Dental  (+) Chipped, Poor Dentition, Missing   Pulmonary neg shortness of breath,    Pulmonary exam normal        Cardiovascular Exercise Tolerance: Good hypertension, (-) anginaNormal cardiovascular exam     Neuro/Psych  Headaches, Seizures -,  negative psych ROS   GI/Hepatic Neg liver ROS, GERD  Controlled,  Endo/Other  negative endocrine ROS  Renal/GU Renal disease  negative genitourinary   Musculoskeletal   Abdominal   Peds  Hematology negative hematology ROS (+)   Anesthesia Other Findings Past Medical History: No date: Anemia No date: GERD (gastroesophageal reflux disease) No date: Hypertension No date: Hyponatremia     Comment:  chronic No date: Kidney disease No date: Migraine No date: Seizures ([place])  Past Surgical History: 05/01/2016: COLONOSCOPY WITH PROPOFOL; N/A     Comment:  Procedure: COLONOSCOPY WITH PROPOFOL;  Surgeon: Lucilla Lame, MD;  Location: [facility] ENDOSCOPY;  Service: Endoscopy;              Laterality: N/A; 05/01/2016: ESOPHAGOGASTRODUODENOSCOPY (EGD) WITH PROPOFOL; N/A     Comment:  Procedure: ESOPHAGOGASTRODUODENOSCOPY (EGD) WITH               PROPOFOL;  Surgeon: Lucilla Lame, MD;  Location: [facility]               ENDOSCOPY;  Service: Endoscopy;  Laterality: N/A; No date: none  BMI    Body Mass Index: 21.26 kg/m      Reproductive/Obstetrics negative OB ROS                            Anesthesia Physical Anesthesia Plan  ASA: 3  Anesthesia Plan: General   Post-op Pain Management:    Induction:  Intravenous  PONV Risk Score and Plan: Propofol infusion and TIVA  Airway Management Planned: Natural Airway and Nasal Cannula  Additional Equipment:   Intra-op Plan:   Post-operative Plan:   Informed Consent: I have reviewed the patients History and Physical, chart, labs and discussed the procedure including the risks, benefits and alternatives for the proposed anesthesia with the patient or authorized representative who has indicated his/her understanding and acceptance.     Dental Advisory Given  Plan Discussed with: Anesthesiologist, CRNA and Surgeon  Anesthesia Plan Comments: (Patient consented for risks of anesthesia including but not limited to:  - adverse reactions to medications - risk of airway placement if required - damage to eyes, teeth, lips or other oral mucosa - nerve damage due to positioning  - sore throat or hoarseness - Damage to heart, brain, nerves, lungs, other parts of body or loss of life  Patient voiced understanding.)        Anesthesia Quick Evaluation

## 2022-06-26 NOTE — Transfer of Care (Signed)
Immediate Anesthesia Transfer of Care Note  Patient: [name]  Procedure(s) Performed: COLONOSCOPY WITH PROPOFOL  Patient Location: Endoscopy Unit  Anesthesia Type:General  Level of Consciousness: awake, drowsy and patient cooperative  Airway & Oxygen Therapy: Patient Spontanous Breathing and Patient connected to face mask oxygen  Post-op Assessment: Report given to RN and Post -op Vital signs reviewed and stable  Post vital signs: Reviewed and stable  Last Vitals:  Vitals Value Taken Time  BP 98/61 06/26/22 0921  Temp    Pulse 64 06/26/22 0926  Resp 29 06/26/22 0926  SpO2 100 % 06/26/22 0926  Vitals shown include unvalidated device data.  Last Pain:  Vitals:   06/26/22 0921  TempSrc:   PainSc: 0-No pain         Complications: No notable events documented.

## 2022-06-26 NOTE — Op Note (Signed)
[facility] Gastroenterology Patient Name: [name] Procedure Date: 06/26/2022 9:04 AM MRN: [id number] Account #: [id number] Date of Birth: May 24, 1961 Admit Type: Outpatient Age: 61 Room: [facility] ENDO ROOM 4 Gender: Male Note Status: Finalized Instrument Name: Jasper Riling [id number] Procedure:             Colonoscopy Indications:           High risk colon cancer surveillance: Personal history                         of colonic polyps Providers:             Lucilla Lame MD, MD Referring MD:          No Local Md, MD (Referring MD) Medicines:             Propofol per Anesthesia Complications:         No immediate complications. Procedure:             Pre-Anesthesia Assessment:                        - Prior to the procedure, a History and Physical was                         performed, and patient medications and allergies were                         reviewed. The patient's tolerance of previous                         anesthesia was also reviewed. The risks and benefits                         of the procedure and the sedation options and risks                         were discussed with the patient. All questions were                         answered, and informed consent was obtained. Prior                         Anticoagulants: The patient has taken no previous                         anticoagulant or antiplatelet agents. ASA Grade                         Assessment: II - A patient with mild systemic disease.                         After reviewing the risks and benefits, the patient                         was deemed in satisfactory condition to undergo the                         procedure.  After obtaining informed consent, the colonoscope was                         passed under direct vision. Throughout the procedure,                         the patient's blood pressure, pulse, and oxygen                         saturations were  monitored continuously. The                         Colonoscope was introduced through the anus and                         advanced to the the cecum, identified by appendiceal                         orifice and ileocecal valve. The colonoscopy was                         performed without difficulty. The patient tolerated                         the procedure well. The quality of the bowel                         preparation was good. Findings:      The perianal and digital rectal examinations were normal.      A 5 mm polyp was found in the transverse colon. The polyp was sessile.       The polyp was removed with a cold snare. Resection and retrieval were       complete.      Non-bleeding internal hemorrhoids were found during retroflexion. The       hemorrhoids were Grade I (internal hemorrhoids that do not prolapse). Impression:            - One 5 mm polyp in the transverse colon, removed with                         a cold snare. Resected and retrieved.                        - Non-bleeding internal hemorrhoids. Recommendation:        - Discharge patient to home.                        - Resume previous diet.                        - Continue present medications.                        - Await pathology results.                        - Repeat colonoscopy in 7 years for surveillance. Procedure Code(s):     --- Professional ---  45385, Colonoscopy, flexible; with removal of                         tumor(s), polyp(s), or other lesion(s) by snare                         technique Diagnosis Code(s):     --- Professional ---                        Z86.010, Personal history of colonic polyps                        K63.5, Polyp of colon CPT copyright 2019 American Medical Association. All rights reserved. The codes documented in this report are preliminary and upon coder review may  be revised to meet current compliance requirements. Lucilla Lame MD, MD 06/26/2022  9:19:29 AM This report has been signed electronically. Number of Addenda: 0 Note Initiated On: 06/26/2022 9:04 AM Scope Withdrawal Time: 0 hours 6 minutes 17 seconds  Total Procedure Duration: 0 hours 9 minutes 40 seconds  Estimated Blood Loss:  Estimated blood loss: none.      [facility]

## 2022-06-26 NOTE — Anesthesia Postprocedure Evaluation (Signed)
Anesthesia Post Note  Patient: [name]  Procedure(s) Performed: COLONOSCOPY WITH PROPOFOL  Patient location during evaluation: Endoscopy Anesthesia Type: General Level of consciousness: awake and alert Pain management: pain level controlled Vital Signs Assessment: post-procedure vital signs reviewed and stable Respiratory status: spontaneous breathing, nonlabored ventilation, respiratory function stable and patient connected to nasal cannula oxygen Cardiovascular status: blood pressure returned to baseline and stable Postop Assessment: no apparent nausea or vomiting Anesthetic complications: no   No notable events documented.   Last Vitals:  Vitals:   06/26/22 0813 06/26/22 0921  BP: 124/76 98/61  Pulse: (!) 55 68  Resp: 18   Temp: 36.8 C   SpO2: 100% 100%    Last Pain:  Vitals:   06/26/22 0921  TempSrc:   PainSc: 0-No pain                 Precious Haws Andora Krull

## 2022-06-26 NOTE — Anesthesia Procedure Notes (Signed)
Procedure Name: General with mask airway Date/Time: 06/26/2022 9:14 AM  Performed by: Kelton Pillar, CRNAPre-anesthesia Checklist: Patient identified, Emergency Drugs available, Suction available and Patient being monitored Patient Re-evaluated:Patient Re-evaluated prior to induction Oxygen Delivery Method: Simple face mask Induction Type: IV induction Placement Confirmation: positive ETCO2, CO2 detector and breath sounds checked- equal and bilateral Dental Injury: Teeth and Oropharynx as per pre-operative assessment

## 2022-06-26 NOTE — H&P (Signed)
Lucilla Lame, MD [address] [place], [place] [address] Phone:[phone] Fax : [phone]  Primary Care Physician:  [name], MD Primary Gastroenterologist:  Dr. Allen Norris  Pre-Procedure History & Physical: HPI:  LEDARIUS LEESON is a 61 y.o. male is here for an colonoscopy.   Past Medical History:  Diagnosis Date   Anemia    GERD (gastroesophageal reflux disease)    Hypertension    Hyponatremia    chronic   Kidney disease    Migraine    Seizures ([place])     Past Surgical History:  Procedure Laterality Date   COLONOSCOPY WITH PROPOFOL N/A 05/01/2016   Procedure: COLONOSCOPY WITH PROPOFOL;  Surgeon: Lucilla Lame, MD;  Location: [facility] ENDOSCOPY;  Service: Endoscopy;  Laterality: N/A;   ESOPHAGOGASTRODUODENOSCOPY (EGD) WITH PROPOFOL N/A 05/01/2016   Procedure: ESOPHAGOGASTRODUODENOSCOPY (EGD) WITH PROPOFOL;  Surgeon: Lucilla Lame, MD;  Location: [facility] ENDOSCOPY;  Service: Endoscopy;  Laterality: N/A;   none      Prior to Admission medications   Medication Sig Start Date End Date Taking? Authorizing Provider  divalproex (DEPAKOTE) 250 MG DR tablet Take 3 tablets (750 mg total) by mouth every 12 (twelve) hours. Patient taking differently: Take 750 mg by mouth 2 (two) times daily. 11/18/15  Yes Vaughan Basta, MD  levETIRAcetam (KEPPRA) 500 MG tablet Take 1 tablet (500 mg total) by mouth 2 (two) times daily. 10/01/20 06/26/22 Yes Enzo Bi, MD  levETIRAcetam (KEPPRA) 500 MG tablet Take 1 tablet (500 mg total) by mouth 2 (two) times daily for 3 days. 10/01/20 06/26/22 Yes Enzo Bi, MD  propranolol (INDERAL) 20 MG tablet Hold this medicine until followup with outpatient doctor because your heart rate was already low in the hospital without it. 10/01/20  Yes Enzo Bi, MD  sodium chloride 1 g tablet Take 2 tablets (2 g total) by mouth 3 (three) times daily with meals. 10/01/20  Yes Enzo Bi, MD  alendronate (FOSAMAX) 70 MG tablet Take 70 mg by mouth once a  week. Take with a full glass of water on an empty stomach.    [provider]  atorvastatin (LIPITOR) 80 MG tablet Take 80 mg by mouth daily. 08/23/20   [provider]  benzonatate (TESSALON PERLES) 100 MG capsule Take 1 capsule (100 mg total) by mouth 3 (three) times daily as needed for cough. 12/17/21 12/17/22  Cuthriell, Charline Bills, PA-C  brompheniramine-pseudoephedrine-DM 30-2-10 MG/5ML syrup Take 10 mLs by mouth 4 (four) times daily as needed. 12/17/21   Cuthriell, Charline Bills, PA-C  feeding supplement, ENSURE ENLIVE, (ENSURE ENLIVE) LIQD Take 237 mLs by mouth 3 (three) times daily between meals. 09/15/16   Hower, Aaron Mose, MD  folic acid (FOLVITE) 1 MG tablet Take 1 tablet (1 mg total) by mouth daily. 10/01/20   Enzo Bi, MD  lisinopril (ZESTRIL) 5 MG tablet Hold this medicine until followup with outpatient doctor because your blood pressure was normal in the hospital without it. 10/01/20   Enzo Bi, MD  ondansetron (ZOFRAN-ODT) 4 MG disintegrating tablet Take 1 tablet (4 mg total) by mouth every 8 (eight) hours as needed for nausea or vomiting. 12/17/21   Cuthriell, Charline Bills, PA-C  predniSONE (DELTASONE) 50 MG tablet Take 1 tablet (50 mg total) by mouth daily with breakfast. 12/17/21   Cuthriell, Charline Bills, PA-C  sulfamethoxazole-trimethoprim (BACTRIM) 400-80 MG tablet Take 1 tablet by mouth 2 (two) times daily. 06/24/21   Jearld Fenton, NP  tamsulosin (FLOMAX) 0.4 MG CAPS capsule Take 1  capsule (0.4 mg total) by mouth at bedtime. 10/01/20   Enzo Bi, MD    Allergies as of 05/04/2022 - Review Complete 05/04/2022  Allergen Reaction Noted   Nsaids Other (See Comments) 12/16/2016    Family History  Problem Relation Age of Onset   Heart attack Father    Kidney disease Father     Social History   Socioeconomic History   Marital status: Single    Spouse name: Not on file   Number of children: Not on file   Years of education: Not on file   Highest education level: Not on  file  Occupational History   Not on file  Tobacco Use   Smoking status: Never   Smokeless tobacco: Never  Vaping Use   Vaping Use: Never used  Substance and Sexual Activity   Alcohol use: No   Drug use: No   Sexual activity: Not on file  Other Topics Concern   Not on file  Social History Narrative   Cross dresses    Social Determinants of Health   Financial Resource Strain: Not on file  Food Insecurity: Not on file  Transportation Needs: Not on file  Physical Activity: Not on file  Stress: Not on file  Social Connections: Not on file  Intimate Partner Violence: Not on file    Review of Systems: See HPI, otherwise negative ROS  Physical Exam: BP 124/76   Pulse (!) 55   Temp 98.2 F (36.8 C) (Temporal)   Resp 18   Ht '5\' 3"'$  (1.6 m)   Wt 54.4 kg   SpO2 100%   BMI 21.26 kg/m  General:   Alert,  pleasant and cooperative in NAD Head:  Normocephalic and atraumatic. Neck:  Supple; no masses or thyromegaly. Lungs:  Clear throughout to auscultation.    Heart:  Regular rate and rhythm. Abdomen:  Soft, nontender and nondistended. Normal bowel sounds, without guarding, and without rebound.   Neurologic:  Alert and  oriented x4;  grossly normal neurologically.  Impression/Plan: AMARDEEP BECKERS is here for an colonoscopy to be performed for a history of adenomatous polyps on 2017   Risks, benefits, limitations, and alternatives regarding  colonoscopy have been reviewed with the patient.  Questions have been answered.  All parties agreeable.   Lucilla Lame, MD  06/26/2022, 8:59 AM

## 2022-06-27 ENCOUNTER — Encounter: Payer: Self-pay | Admitting: Gastroenterology

## 2022-06-27 LAB — SURGICAL PATHOLOGY

## 2022-06-28 ENCOUNTER — Encounter: Payer: Self-pay | Admitting: Gastroenterology

## 2024-07-02 DIAGNOSIS — I10 Essential (primary) hypertension: Secondary | ICD-10-CM | POA: Diagnosis not present

## 2024-07-02 DIAGNOSIS — D631 Anemia in chronic kidney disease: Secondary | ICD-10-CM | POA: Diagnosis not present

## 2024-07-02 DIAGNOSIS — N182 Chronic kidney disease, stage 2 (mild): Secondary | ICD-10-CM | POA: Diagnosis not present

## 2024-07-02 DIAGNOSIS — E871 Hypo-osmolality and hyponatremia: Secondary | ICD-10-CM | POA: Diagnosis not present

## 2024-07-08 DIAGNOSIS — N182 Chronic kidney disease, stage 2 (mild): Secondary | ICD-10-CM | POA: Diagnosis not present

## 2024-07-20 DIAGNOSIS — Z0100 Encounter for examination of eyes and vision without abnormal findings: Secondary | ICD-10-CM | POA: Diagnosis not present

## 2024-08-06 DIAGNOSIS — E871 Hypo-osmolality and hyponatremia: Secondary | ICD-10-CM | POA: Diagnosis not present

## 2024-08-06 DIAGNOSIS — D631 Anemia in chronic kidney disease: Secondary | ICD-10-CM | POA: Diagnosis not present

## 2024-08-06 DIAGNOSIS — N182 Chronic kidney disease, stage 2 (mild): Secondary | ICD-10-CM | POA: Diagnosis not present

## 2024-08-06 DIAGNOSIS — I10 Essential (primary) hypertension: Secondary | ICD-10-CM | POA: Diagnosis not present

## 2024-08-08 ENCOUNTER — Emergency Department
Admission: EM | Admit: 2024-08-08 | Discharge: 2024-08-08 | Disposition: A | Discharge status: Home / Self Care | Attending: Emergency Medicine | Admitting: Emergency Medicine

## 2024-08-08 ENCOUNTER — Other Ambulatory Visit: Payer: Self-pay

## 2024-08-08 DIAGNOSIS — S80862A Insect bite (nonvenomous), left lower leg, initial encounter: Secondary | ICD-10-CM | POA: Diagnosis not present

## 2024-08-08 DIAGNOSIS — W57XXXA Bitten or stung by nonvenomous insect and other nonvenomous arthropods, initial encounter: Secondary | ICD-10-CM | POA: Insufficient documentation

## 2024-08-08 DIAGNOSIS — I10 Essential (primary) hypertension: Secondary | ICD-10-CM | POA: Diagnosis not present

## 2024-08-08 NOTE — Discharge Instructions (Signed)
 You were evaluated in the ED for an insect bite to your left lower leg.  On physical exam there is no stinger or signs of infection.  Keep this area clean with soap and water.  Change bandages as needed.  Monitor for symptoms of infection as discussed. Apply warm compresses to the area for comfort. Follow-up with your primary care provider in 1 week as needed.

## 2024-08-08 NOTE — ED Notes (Signed)
 Wound was cleaned and dressed by PA-C.

## 2024-08-08 NOTE — ED Provider Notes (Signed)
 [facility] Emergency Department Provider Note     Event Date/Time   First MD Initiated Contact with Patient 08/08/24 1008     (approximate)   History   Insect Bite   HPI  [name] is a 63 y.o. male with a history of HTN, anemia, hyponatremia, kidney disease and GERD presents to the ED for evaluation of a possible insect bite to his left lower leg.  Patient reports that on Monday or Tuesday of this week he felt a sting or bite to his left lower leg.  He did not see what could have bit him, but presents to the ED because the site remains painful to touch.  He denies drainage from the site or fever.      Physical Exam   Triage Vital Signs: ED Triage Vitals  Encounter Vitals Group     BP 08/08/24 0950 96/62     Girls Systolic BP Percentile --      Girls Diastolic BP Percentile --      Boys Systolic BP Percentile --      Boys Diastolic BP Percentile --      Pulse Rate 08/08/24 0950 65     Resp 08/08/24 0950 18     Temp 08/08/24 0950 98.1 F (36.7 C)     Temp src --      SpO2 08/08/24 0950 100 %     Weight 08/08/24 0945 125 lb (56.7 kg)     Height 08/08/24 0945 5' 3 (1.6 m)     Head Circumference --      Peak Flow --      Pain Score 08/08/24 0945 4     Pain Loc --      Pain Education --      Exclude from Growth Chart --     Most recent vital signs: Vitals:   08/08/24 0950  BP: 96/62  Pulse: 65  Resp: 18  Temp: 98.1 F (36.7 C)  SpO2: 100%    General Awake, no distress.  HEENT NCAT.  CV:  Good peripheral perfusion.  RESP:  Normal effort.  ABD:  No distention.  Other:  Left lower leg reveals reveals a < 1 cm flat skin lesion that appears to be an insect bite with erythematous base. No surrounding erythema or drainage. Mild tenderness to palpation. No stinger visualized or palpable.    ED Results / Procedures / Treatments   Labs (all labs ordered are listed, but only abnormal results are displayed) Labs Reviewed - No  data to display  No results found.  PROCEDURES:  Critical Care performed: No  Procedures  MEDICATIONS ORDERED IN ED: Medications - No data to display  IMPRESSION / MDM / ASSESSMENT AND PLAN / ED COURSE  I reviewed the triage vital signs and the nursing notes.                               63 y.o. male presents to the emergency department for evaluation and treatment of insect bite. See HPI for further details.   Differential diagnosis includes, but is not limited to insect bite, cellulitis, abrasion    Patient's presentation is most consistent with acute complicated illness / injury requiring diagnostic workup.  Patient is alert and oriented.  He is hemodynamically stable on initial assessment.  He is well-appearing on physical exam. physical exam findings are stated above.  No stinger noted.  No systemic symptoms to indicate oral antibiotic.  Education and demonstration on wound care provided to patient.  Advised topical antibiotic ointment and warm compresses.  Patient verbalized understanding.  Advised follow-up with primary care provider.  Patient stable for discharge.  ED return precaution discussed.  FINAL CLINICAL IMPRESSION(S) / ED DIAGNOSES   Final diagnoses:  Insect bite of left leg, initial encounter   Rx / DC Orders   ED Discharge Orders     None      Note:  This document was prepared using Dragon voice recognition software and may include unintentional dictation errors.    Margrette, Bita Cartwright A, PA-C 08/08/24 1036    Claudene Rover, MD 08/08/24 1048

## 2024-08-08 NOTE — ED Triage Notes (Signed)
 Pt to ED for possible insect bite/sting on Monday or Tuesday., reports still painful . Small red spot noted to back of left lower leg.

## 2024-08-26 ENCOUNTER — Emergency Department

## 2024-08-26 ENCOUNTER — Encounter: Payer: Self-pay | Admitting: Emergency Medicine

## 2024-08-26 ENCOUNTER — Other Ambulatory Visit: Payer: Self-pay

## 2024-08-26 ENCOUNTER — Inpatient Hospital Stay: Admission: EM | Admit: 2024-08-26 | Discharge: 2024-09-05 | DRG: 917 | Disposition: A | Discharge status: Home Health

## 2024-08-26 DIAGNOSIS — N179 Acute kidney failure, unspecified: Secondary | ICD-10-CM | POA: Diagnosis not present

## 2024-08-26 DIAGNOSIS — N401 Enlarged prostate with lower urinary tract symptoms: Secondary | ICD-10-CM | POA: Diagnosis present

## 2024-08-26 DIAGNOSIS — R579 Shock, unspecified: Secondary | ICD-10-CM | POA: Diagnosis present

## 2024-08-26 DIAGNOSIS — I4892 Unspecified atrial flutter: Principal | ICD-10-CM | POA: Diagnosis present

## 2024-08-26 DIAGNOSIS — R0902 Hypoxemia: Secondary | ICD-10-CM | POA: Diagnosis not present

## 2024-08-26 DIAGNOSIS — R338 Other retention of urine: Secondary | ICD-10-CM | POA: Diagnosis present

## 2024-08-26 DIAGNOSIS — Z8249 Family history of ischemic heart disease and other diseases of the circulatory system: Secondary | ICD-10-CM | POA: Diagnosis not present

## 2024-08-26 DIAGNOSIS — R0789 Other chest pain: Secondary | ICD-10-CM | POA: Diagnosis not present

## 2024-08-26 DIAGNOSIS — D631 Anemia in chronic kidney disease: Secondary | ICD-10-CM | POA: Diagnosis present

## 2024-08-26 DIAGNOSIS — G40909 Epilepsy, unspecified, not intractable, without status epilepticus: Secondary | ICD-10-CM | POA: Diagnosis present

## 2024-08-26 DIAGNOSIS — I2489 Other forms of acute ischemic heart disease: Secondary | ICD-10-CM | POA: Diagnosis present

## 2024-08-26 DIAGNOSIS — M7989 Other specified soft tissue disorders: Secondary | ICD-10-CM

## 2024-08-26 DIAGNOSIS — R0609 Other forms of dyspnea: Secondary | ICD-10-CM

## 2024-08-26 DIAGNOSIS — I10 Essential (primary) hypertension: Secondary | ICD-10-CM | POA: Diagnosis not present

## 2024-08-26 DIAGNOSIS — G250 Essential tremor: Secondary | ICD-10-CM | POA: Diagnosis present

## 2024-08-26 DIAGNOSIS — I4891 Unspecified atrial fibrillation: Secondary | ICD-10-CM | POA: Diagnosis not present

## 2024-08-26 DIAGNOSIS — I959 Hypotension, unspecified: Secondary | ICD-10-CM | POA: Diagnosis not present

## 2024-08-26 DIAGNOSIS — E213 Hyperparathyroidism, unspecified: Secondary | ICD-10-CM | POA: Diagnosis not present

## 2024-08-26 DIAGNOSIS — E876 Hypokalemia: Secondary | ICD-10-CM | POA: Diagnosis present

## 2024-08-26 DIAGNOSIS — R001 Bradycardia, unspecified: Secondary | ICD-10-CM | POA: Diagnosis present

## 2024-08-26 DIAGNOSIS — I129 Hypertensive chronic kidney disease with stage 1 through stage 4 chronic kidney disease, or unspecified chronic kidney disease: Secondary | ICD-10-CM | POA: Diagnosis present

## 2024-08-26 DIAGNOSIS — Z7982 Long term (current) use of aspirin: Secondary | ICD-10-CM | POA: Diagnosis not present

## 2024-08-26 DIAGNOSIS — R570 Cardiogenic shock: Secondary | ICD-10-CM | POA: Diagnosis present

## 2024-08-26 DIAGNOSIS — Z860100 Personal history of colon polyps, unspecified: Secondary | ICD-10-CM

## 2024-08-26 DIAGNOSIS — Z888 Allergy status to other drugs, medicaments and biological substances status: Secondary | ICD-10-CM | POA: Diagnosis not present

## 2024-08-26 DIAGNOSIS — R7989 Other specified abnormal findings of blood chemistry: Secondary | ICD-10-CM | POA: Diagnosis not present

## 2024-08-26 DIAGNOSIS — N1330 Unspecified hydronephrosis: Secondary | ICD-10-CM | POA: Diagnosis not present

## 2024-08-26 DIAGNOSIS — T447X1A Poisoning by beta-adrenoreceptor antagonists, accidental (unintentional), initial encounter: Principal | ICD-10-CM | POA: Diagnosis present

## 2024-08-26 DIAGNOSIS — D6489 Other specified anemias: Secondary | ICD-10-CM | POA: Diagnosis not present

## 2024-08-26 DIAGNOSIS — Z7983 Long term (current) use of bisphosphonates: Secondary | ICD-10-CM

## 2024-08-26 DIAGNOSIS — R079 Chest pain, unspecified: Secondary | ICD-10-CM | POA: Diagnosis not present

## 2024-08-26 DIAGNOSIS — I471 Supraventricular tachycardia, unspecified: Secondary | ICD-10-CM | POA: Diagnosis present

## 2024-08-26 DIAGNOSIS — E875 Hyperkalemia: Secondary | ICD-10-CM | POA: Diagnosis present

## 2024-08-26 DIAGNOSIS — X58XXXA Exposure to other specified factors, initial encounter: Secondary | ICD-10-CM | POA: Diagnosis present

## 2024-08-26 DIAGNOSIS — D696 Thrombocytopenia, unspecified: Secondary | ICD-10-CM | POA: Diagnosis present

## 2024-08-26 DIAGNOSIS — I259 Chronic ischemic heart disease, unspecified: Secondary | ICD-10-CM | POA: Diagnosis not present

## 2024-08-26 DIAGNOSIS — Z79899 Other long term (current) drug therapy: Secondary | ICD-10-CM | POA: Diagnosis not present

## 2024-08-26 DIAGNOSIS — R0602 Shortness of breath: Secondary | ICD-10-CM | POA: Diagnosis present

## 2024-08-26 DIAGNOSIS — R42 Dizziness and giddiness: Secondary | ICD-10-CM

## 2024-08-26 DIAGNOSIS — I952 Hypotension due to drugs: Secondary | ICD-10-CM | POA: Diagnosis present

## 2024-08-26 DIAGNOSIS — I480 Paroxysmal atrial fibrillation: Secondary | ICD-10-CM | POA: Diagnosis present

## 2024-08-26 DIAGNOSIS — D649 Anemia, unspecified: Secondary | ICD-10-CM | POA: Diagnosis not present

## 2024-08-26 DIAGNOSIS — J9601 Acute respiratory failure with hypoxia: Secondary | ICD-10-CM | POA: Diagnosis present

## 2024-08-26 DIAGNOSIS — E785 Hyperlipidemia, unspecified: Secondary | ICD-10-CM | POA: Diagnosis present

## 2024-08-26 DIAGNOSIS — D61818 Other pancytopenia: Secondary | ICD-10-CM | POA: Diagnosis not present

## 2024-08-26 DIAGNOSIS — N1832 Chronic kidney disease, stage 3b: Secondary | ICD-10-CM | POA: Diagnosis present

## 2024-08-26 DIAGNOSIS — Z841 Family history of disorders of kidney and ureter: Secondary | ICD-10-CM

## 2024-08-26 DIAGNOSIS — T461X5A Adverse effect of calcium-channel blockers, initial encounter: Secondary | ICD-10-CM | POA: Diagnosis present

## 2024-08-26 LAB — CBC WITH DIFFERENTIAL/PLATELET
Abs Immature Granulocytes: 0.03 K/uL (ref 0.00–0.07)
Basophils Absolute: 0 K/uL (ref 0.0–0.1)
Basophils Relative: 0 %
Eosinophils Absolute: 0.1 K/uL (ref 0.0–0.5)
Eosinophils Relative: 2 %
HCT: 25.5 % — ABNORMAL LOW (ref 39.0–52.0)
Hemoglobin: 8.5 g/dL — ABNORMAL LOW (ref 13.0–17.0)
Immature Granulocytes: 1 %
Lymphocytes Relative: 20 %
Lymphs Abs: 1.2 K/uL (ref 0.7–4.0)
MCH: 34.3 pg — ABNORMAL HIGH (ref 26.0–34.0)
MCHC: 33.3 g/dL (ref 30.0–36.0)
MCV: 102.8 fL — ABNORMAL HIGH (ref 80.0–100.0)
Monocytes Absolute: 0.4 K/uL (ref 0.1–1.0)
Monocytes Relative: 7 %
Neutro Abs: 4.3 K/uL (ref 1.7–7.7)
Neutrophils Relative %: 70 %
Platelets: 114 K/uL — ABNORMAL LOW (ref 150–400)
RBC: 2.48 MIL/uL — ABNORMAL LOW (ref 4.22–5.81)
RDW: 16.9 % — ABNORMAL HIGH (ref 11.5–15.5)
WBC: 6.1 K/uL (ref 4.0–10.5)
nRBC: 0 % (ref 0.0–0.2)

## 2024-08-26 LAB — COMPREHENSIVE METABOLIC PANEL WITH GFR
ALT: 17 U/L (ref 0–44)
AST: 58 U/L — ABNORMAL HIGH (ref 15–41)
Albumin: 3.1 g/dL — ABNORMAL LOW (ref 3.5–5.0)
Alkaline Phosphatase: 32 U/L — ABNORMAL LOW (ref 38–126)
Anion gap: 15 (ref 5–15)
BUN: 42 mg/dL — ABNORMAL HIGH (ref 8–23)
CO2: 22 mmol/L (ref 22–32)
Calcium: 8.5 mg/dL — ABNORMAL LOW (ref 8.9–10.3)
Chloride: 102 mmol/L (ref 98–111)
Creatinine, Ser: 2.02 mg/dL — ABNORMAL HIGH (ref 0.61–1.24)
GFR, Estimated: 36 mL/min — ABNORMAL LOW (ref >60)
Glucose, Bld: 175 mg/dL — ABNORMAL HIGH (ref 70–99)
Potassium: 3.3 mmol/L — ABNORMAL LOW (ref 3.5–5.1)
Sodium: 139 mmol/L (ref 135–145)
Total Bilirubin: 0.8 mg/dL (ref 0.0–1.2)
Total Protein: 5.8 g/dL — ABNORMAL LOW (ref 6.5–8.1)

## 2024-08-26 LAB — IRON AND TIBC
Iron: 150 ug/dL (ref 45–182)
Saturation Ratios: 55 % — ABNORMAL HIGH (ref 17.9–39.5)
TIBC: 274 ug/dL (ref 250–450)
UIBC: 124 ug/dL

## 2024-08-26 LAB — PROTIME-INR
INR: 1 (ref 0.8–1.2)
Prothrombin Time: 13.7 s (ref 11.4–15.2)

## 2024-08-26 LAB — RETICULOCYTES
Immature Retic Fract: 6.3 % (ref 2.3–15.9)
RBC.: 2.47 MIL/uL — ABNORMAL LOW (ref 4.22–5.81)
Retic Count, Absolute: 20.3 K/uL (ref 19.0–186.0)
Retic Ct Pct: 0.8 % (ref 0.4–3.1)

## 2024-08-26 LAB — FOLATE: Folate: 4.7 ng/mL — ABNORMAL LOW (ref >5.9)

## 2024-08-26 LAB — TROPONIN I (HIGH SENSITIVITY)
Troponin I (High Sensitivity): 24 ng/L — ABNORMAL HIGH (ref <18)
Troponin I (High Sensitivity): 23 ng/L — ABNORMAL HIGH (ref <18)

## 2024-08-26 LAB — BRAIN NATRIURETIC PEPTIDE: B Natriuretic Peptide: 341 pg/mL — ABNORMAL HIGH (ref 0.0–100.0)

## 2024-08-26 LAB — HEMOGLOBIN: Hemoglobin: 8.1 g/dL — ABNORMAL LOW (ref 13.0–17.0)

## 2024-08-26 LAB — FERRITIN: Ferritin: 273 ng/mL (ref 24–336)

## 2024-08-26 MED ORDER — ACETAMINOPHEN 325 MG PO TABS: 650.0000 mg | ORAL_TABLET | Freq: Four times a day (QID) | ORAL | Status: DC | PRN

## 2024-08-26 MED ORDER — ATORVASTATIN CALCIUM 20 MG PO TABS
80.0000 mg | ORAL_TABLET | Freq: Every day | ORAL | Status: DC
  Administered 2024-08-27 – 2024-09-05 (×10): 80 mg via ORAL
  Filled 2024-08-26 (×5): qty 1
  Filled 2024-08-26: qty 4
  Filled 2024-08-26: qty 1
  Filled 2024-08-26: qty 4
  Filled 2024-08-26 (×2): qty 1

## 2024-08-26 MED ORDER — IOHEXOL 350 MG/ML SOLN
75.0000 mL | Freq: Once | INTRAVENOUS | Status: AC | PRN
  Administered 2024-08-26: 75 mL via INTRAVENOUS

## 2024-08-26 MED ORDER — ONDANSETRON HCL 4 MG/2ML IJ SOLN
4.0000 mg | Freq: Four times a day (QID) | INTRAMUSCULAR | Status: DC | PRN
  Administered 2024-08-31: 4 mg via INTRAVENOUS
  Filled 2024-08-26: qty 2

## 2024-08-26 MED ORDER — SODIUM CHLORIDE 0.9 % IV BOLUS
500.0000 mL | Freq: Once | INTRAVENOUS | Status: AC
  Administered 2024-08-26: 500 mL via INTRAVENOUS

## 2024-08-26 MED ORDER — ENOXAPARIN SODIUM 30 MG/0.3ML IJ SOSY
30.0000 mg | PREFILLED_SYRINGE | INTRAMUSCULAR | Status: DC
  Filled 2024-08-26 (×2): qty 0.3

## 2024-08-26 MED ORDER — ONDANSETRON HCL 4 MG PO TABS: 4.0000 mg | ORAL_TABLET | Freq: Four times a day (QID) | ORAL | Status: DC | PRN

## 2024-08-26 MED ORDER — DIVALPROEX SODIUM 500 MG PO DR TAB
750.0000 mg | DELAYED_RELEASE_TABLET | Freq: Two times a day (BID) | ORAL | Status: DC
  Administered 2024-08-27 – 2024-09-05 (×20): 750 mg via ORAL
  Filled 2024-08-26 (×21): qty 1

## 2024-08-26 MED ORDER — DILTIAZEM HCL 60 MG PO TABS
30.0000 mg | ORAL_TABLET | Freq: Four times a day (QID) | ORAL | Status: DC
  Administered 2024-08-27: 30 mg via ORAL
  Filled 2024-08-26: qty 1

## 2024-08-26 MED ORDER — TAMSULOSIN HCL 0.4 MG PO CAPS
0.4000 mg | ORAL_CAPSULE | Freq: Every day | ORAL | Status: DC
Start: 2024-08-27 — End: 2024-08-31
  Administered 2024-08-27 – 2024-08-31 (×5): 0.4 mg via ORAL
  Filled 2024-08-26 (×5): qty 1

## 2024-08-26 MED ORDER — DILTIAZEM HCL 60 MG PO TABS
30.0000 mg | ORAL_TABLET | Freq: Once | ORAL | Status: AC
  Administered 2024-08-26: 30 mg via ORAL
  Filled 2024-08-26: qty 1

## 2024-08-26 MED ORDER — ACETAMINOPHEN 650 MG RE SUPP: 650.0000 mg | Freq: Four times a day (QID) | RECTAL | Status: DC | PRN

## 2024-08-26 MED ORDER — LEVETIRACETAM 500 MG PO TABS
500.0000 mg | ORAL_TABLET | Freq: Two times a day (BID) | ORAL | Status: DC
  Administered 2024-08-27 – 2024-09-05 (×20): 500 mg via ORAL
  Filled 2024-08-26 (×20): qty 1

## 2024-08-26 NOTE — Assessment & Plan Note (Signed)
 Recent PTH intact elevated at 179 Being managed outpatient by nephrology

## 2024-08-26 NOTE — ED Provider Notes (Addendum)
 [facility] Provider Note    Event Date/Time   First MD Initiated Contact with Patient 08/26/24 1926     (approximate)   History   Chest Pain   HPI  [name] is a 63 y.o. male with history of CKD stage II, hypertension, GERD, seizure, A-fib not on anticoagulation presenting with chest pain and shortness of breath.  States that has been occurring since Friday intermittently.  Started when he was taking out the trash on Friday.  Denies any cough or fever.  Also noted that his legs were swollen today.  Denies recent trauma or falls.  States no chest pain at this time.  No abdominal pain or nausea vomiting or diarrhea or urinary symptoms.  He denies any melena or hematochezia.  Per independent history from EMS when they initially got to him, he was cold and diaphoretic, initially hypotensive to the 70s systolic, A-fib with RVR to 150s, they gave him 10 mg of IV Dilt, as well as some IV fluids, repeat blood pressures for them was systolic 80s, was also noted to have a poor waveform so they placed him on 4 L nasal cannula.    On independent chart review, and an echo done in 2021 that shows an EF of 6065% with normal diastolic function, was seen by Dr. Lazarus from nephrology in July of this year, has history of CKD and hyponatremia, blood pressures are currently systolic 100s.   Physical Exam   Triage Vital Signs: ED Triage Vitals  Encounter Vitals Group     BP      Girls Systolic BP Percentile      Girls Diastolic BP Percentile      Boys Systolic BP Percentile      Boys Diastolic BP Percentile      Pulse      Resp      Temp      Temp src      SpO2      Weight      Height      Head Circumference      Peak Flow      Pain Score      Pain Loc      Pain Education      Exclude from Growth Chart     Most recent vital signs: Vitals:   08/26/24 2003 08/26/24 2126  BP:  (!) 132/116  Pulse:    Resp:    Temp: 98.2 F (36.8 C)   SpO2:        General: Awake, no distress.  CV:  Good peripheral perfusion.  Resp:  Normal effort.  No tachypnea or respiratory distress Abd:  No distention.  Soft nontender Other:  Lower extremity edema that appears symmetrical   ED Results / Procedures / Treatments   Labs (all labs ordered are listed, but only abnormal results are displayed) Labs Reviewed  COMPREHENSIVE METABOLIC PANEL WITH GFR - Abnormal; Notable for the following components:      Result Value   Potassium 3.3 (*)    Glucose, Bld 175 (*)    BUN 42 (*)    Creatinine, Ser 2.02 (*)    Calcium  8.5 (*)    Total Protein 5.8 (*)    Albumin 3.1 (*)    AST 58 (*)    Alkaline Phosphatase 32 (*)    GFR, Estimated 36 (*)    All other components within normal limits  BRAIN NATRIURETIC PEPTIDE - Abnormal; Notable for the  following components:   B Natriuretic Peptide 341.0 (*)    All other components within normal limits  CBC WITH DIFFERENTIAL/PLATELET - Abnormal; Notable for the following components:   RBC 2.48 (*)    Hemoglobin 8.5 (*)    HCT 25.5 (*)    MCV 102.8 (*)    MCH 34.3 (*)    RDW 16.9 (*)    Platelets 114 (*)    All other components within normal limits  BLOOD GAS, VENOUS - Abnormal; Notable for the following components:   pH, Ven 7.44 (*)    Bicarbonate 30.6 (*)    Acid-Base Excess 5.6 (*)    All other components within normal limits  TROPONIN I (HIGH SENSITIVITY) - Abnormal; Notable for the following components:   Troponin I (High Sensitivity) 24 (*)    All other components within normal limits  PROTIME-INR  TROPONIN I (HIGH SENSITIVITY)     EKG  EKG shows, atrial flutter, rate 97, normal QS, normal QTc, no obvious ischemic ST elevation, T wave flattening to aVL, this is changed compared to prior since prior EKG shows sinus bradycardia.   RADIOLOGY On my independent interpretation, chest x-ray without obvious consolidation   PROCEDURES:  Critical Care performed:  No  Procedures   MEDICATIONS ORDERED IN ED: Medications  iohexol  (OMNIPAQUE ) 350 MG/ML injection 75 mL (75 mLs Intravenous Contrast Given 08/26/24 2055)  diltiazem  (CARDIZEM ) tablet 30 mg (30 mg Oral Given 08/26/24 2126)  sodium chloride  0.9 % bolus 500 mL (500 mLs Intravenous New Bag/Given 08/26/24 2128)     IMPRESSION / MDM / ASSESSMENT AND PLAN / ED COURSE  I reviewed the triage vital signs and the nursing notes.                              Differential diagnosis includes, but is not limited to, A-fib with RVR, arrhythmia, electrolyte derangements, angina, ACS, CHF exacerbation, AKI, volume retention, did consider PE.  Will get labs, EKG, troponin, chest x-ray.  CT PE study.  Patient's presentation is most consistent with acute presentation with potential threat to life or bodily function.  Independent interpretation of labs and imaging below.  Patient was observed in the emergency department without recurrence of hypotension, CT PE study without obvious PE or pulmonary edema, given that he had unstable A-fib with RVR with EMS, he will need to be admitted for further management and likely an echo done while inpatient.  Will give him some IV fluids as well as p.o. Dilt.  Consulted hospitalist was agreeable with the plan for admission and will evaluate the patient.  He is admitted.  The patient is on the cardiac monitor to evaluate for evidence of arrhythmia and/or significant heart rate changes.   Clinical Course as of 08/26/24 2132  Wed Aug 26, 2024  2002 DG Chest 1 View No active disease.  [TT]  2100 Independent review of labs, he has leukocytosis, hemoglobin is down compared to prior, he denies any hematochezia or melena, could be secondary to anemia of chronic disease, PT/INR is normal, he does have an AKI, he is not retaining CO2, troponin is mildly elevated. [TT]  2115 CT Angio Chest PE W/Cm &/Or Wo Cm IMPRESSION: 1. No pulmonary embolism. 2. No acute pulmonary  abnormality.   [TT]  2116 Patient's blood pressures have been stable here, heart rates in the 90s, will give him some p.o. Dilt and some IV fluids. [TT]  2121 B Natriuretic  Peptide(!): 341.0 Elevated but he has no pulmonary edema on imaging [TT]    Clinical Course User Index [TT] Waymond Lorelle Cummins, MD     FINAL CLINICAL IMPRESSION(S) / ED DIAGNOSES   Final diagnoses:  Chest pain, unspecified type  SOB (shortness of breath)  Atrial flutter, unspecified type (HCC)  Leg swelling  AKI (acute kidney injury) (HCC)     Rx / DC Orders   ED Discharge Orders     None        Note:  This document was prepared using Dragon voice recognition software and may include unintentional dictation errors.    Waymond Lorelle Cummins, MD 08/26/24 2130    Waymond Lorelle Cummins, MD 08/26/24 2132

## 2024-08-26 NOTE — Assessment & Plan Note (Addendum)
 Received 1 unit of packed red blood cells while here.  Ferritin elevated going along with anemia of chronic disease.  Last hemoglobin 9.5.  Bone marrow biopsy yesterday (results pending).

## 2024-08-26 NOTE — ED Triage Notes (Signed)
 BIB ems from home for chest pain, afib rvr, hypotension, hypoxia  Per ems when we got to him he was on the floor pale and diaphoretic, O2 was in 80s RA, placed on 6L [place], gave 10mg  of Cardizem  for rate 140s-150s. Initial bp was 74/48.  Pt A&O on arrival, states symptoms started on Friday.

## 2024-08-26 NOTE — Assessment & Plan Note (Deleted)
 Suspect related to rapid A-flutter, possibly some hypovolemia SBP with EMS in the 70s, improving to the 80s with fluid bolus now better at 141/122 at admission Will evaluate for blood loss given low hemoglobin Continue as needed fluid boluses Will hold antihypertensives Continue oral Cardizem 

## 2024-08-26 NOTE — Hospital Course (Signed)
 [facility]

## 2024-08-26 NOTE — Assessment & Plan Note (Addendum)
 Was reportedly hypoxic to the 80s with EMS improved with O2 at 4 L.  This problem has resolved and off oxygen.

## 2024-08-26 NOTE — Assessment & Plan Note (Addendum)
 On presentation.  Now sinus bradycardia.  Off systemic anticoagulation due to anemia.  EF 60%

## 2024-08-26 NOTE — Assessment & Plan Note (Signed)
 History of hyponatremia related to Tegretol  Continue Tegretol  pending med rec Monitor for hyponatremia

## 2024-08-26 NOTE — Assessment & Plan Note (Addendum)
 Demand ischemia with fast heart rate

## 2024-08-26 NOTE — Assessment & Plan Note (Addendum)
 Secondary to anemia or fast heart rate

## 2024-08-26 NOTE — Progress Notes (Signed)
 Anticoagulation monitoring(Lovenox ):  63 yo male ordered Lovenox  40 mg Q24h    Filed Weights   08/26/24 1927  Weight: 54.4 kg (120 lb)   BMI 21.3   Lab Results  Component Value Date   CREATININE 2.02 (H) 08/26/2024   CREATININE 0.96 11/26/2021   CREATININE 1.26 (H) 06/08/2021   Estimated Creatinine Clearance: 28.1 mL/min (A) (by C-G formula based on SCr of 2.02 mg/dL (H)). Hemoglobin & Hematocrit     Component Value Date/Time   HGB 8.5 (L) 08/26/2024 1926   HGB 9.0 (L) 03/10/2015 1103   HCT 25.5 (L) 08/26/2024 1926   HCT 27.1 (L) 03/10/2015 1103     Per Protocol for Patient with estCrcl < 30 ml/min and BMI < 30, will transition to Lovenox  30 mg Q24h.

## 2024-08-26 NOTE — Assessment & Plan Note (Deleted)
 Patient with weakness and near fall, symptomatic tachyarrhythmia with hypotension, superimposed on ?ymptomatic anemia Continuous cardiac monitoring, echocardiogram Neurologic checks with fall and aspiration precautions

## 2024-08-26 NOTE — Assessment & Plan Note (Deleted)
 Holding antihypertensives except for Cardizem  due to hypotension on arrival of EMS

## 2024-08-26 NOTE — Assessment & Plan Note (Addendum)
 Today's creatinine 2.05.  Lowest creatinine 1.53.  Highest creatinine 2.05.  Will give a fluid bolus today.

## 2024-08-26 NOTE — H&P (Incomplete)
 History and Physical    Patient: [name] FMW:[id number] DOB: 1961-02-06 DOA: 08/26/2024 DOS: the patient was seen and examined on 08/26/2024 PCP: Glover Lenis, MD  Patient coming from: Home  Chief Complaint:  Chief Complaint  Patient presents with   Chest Pain    HPI: [name] is a 63 y.o. male with medical history significant for HTN, seizure disorder, essential tremor, chronic anemia,, CKD 3b , paroxysmal A-fib 2022, currently on aspirin  by cardiology due to CHA2DS2-VASc score of 1 (last evaluated by cardiology 05/2023)  being admitted with rapid A-fib and chest pain.  Patient states he was in his usual state of health until 3 days ago when he developed sudden onset chest pain as he was walking back to the house from taking his trash can to the road.  He states he took up propranolol  (which he takes as needed for palpitations ) and went to lie down.  His symptoms improved however over the next few days he started feeling very fatigued with dyspnea on exertion and noticed swelling of his lower extremities.  Today when he tried to go downstairs he felt so weak he could not bear weight on his legs and he decided to call EMS. With EMS, he was noted to be pale and diaphoretic with O2 sats in the 80s and heart rate in the 140s to 150s with BP 74/48.  He received a diltiazem  10 mg bolus along with a fluid bolus.  He was also placed on oxygen at 4 L . On arrival in the ED tachycardic to 124 with BP 141/122, O2 sat 98% on room air and was weaned off the O2. Labs notable for the following: VBG with pH 7.44 normal pCO2 of 45. Troponin 24 and BNP 341 WBC normal  hemoglobin 8.5,( down from 10.7 on 07/02/24) Creatinine 2.02(baseline 1.74, 06/2024 and 1.22 10/2023) with BUN 42, potassium 3.3, sodium 139, ALT/ALT 58/17. EKG shows a flutter at 97  Chest x-ray without edema. CTA chest negative for PE or other acute issues. Patient was treated with an NS bolus of 500 and given oral  Cardizem  Admission requested.     Review of Systems: As mentioned in the history of present illness. All other systems reviewed and are negative.  Past Medical History:  Diagnosis Date   Anemia    GERD (gastroesophageal reflux disease)    Hypertension    Hyponatremia    chronic   Kidney disease    Migraine    Seizures (HCC)    Past Surgical History:  Procedure Laterality Date   COLONOSCOPY WITH PROPOFOL  N/A 05/01/2016   Procedure: COLONOSCOPY WITH PROPOFOL ;  Surgeon: Rogelia Copping, MD;  Location: [facility] ENDOSCOPY;  Service: Endoscopy;  Laterality: N/A;   COLONOSCOPY WITH PROPOFOL  N/A 06/26/2022   Procedure: COLONOSCOPY WITH PROPOFOL ;  Surgeon: Copping Rogelia, MD;  Location: [facility] ENDOSCOPY;  Service: Endoscopy;  Laterality: N/A;   ESOPHAGOGASTRODUODENOSCOPY (EGD) WITH PROPOFOL  N/A 05/01/2016   Procedure: ESOPHAGOGASTRODUODENOSCOPY (EGD) WITH PROPOFOL ;  Surgeon: Rogelia Copping, MD;  Location: [facility] ENDOSCOPY;  Service: Endoscopy;  Laterality: N/A;   none     Social History:  reports that he has never smoked. He has never used smokeless tobacco. He reports that he does not drink alcohol and does not use drugs.  Allergies  Allergen Reactions   Nsaids Other (See Comments)    Kidney disease    Family History  Problem Relation Age of Onset   Heart attack Father    Kidney disease Father  Prior to Admission medications   Medication Sig Start Date End Date Taking? Authorizing Provider  alendronate  (FOSAMAX ) 70 MG tablet Take 70 mg by mouth once a week. Take with a full glass of water on an empty stomach.    [provider]  atorvastatin  (LIPITOR ) 80 MG tablet Take 80 mg by mouth daily. 08/23/20   [provider]  brompheniramine-pseudoephedrine-DM 30-2-10 MG/5ML syrup Take 10 mLs by mouth 4 (four) times daily as needed. 12/17/21   Cuthriell, Dorn BIRCH, PA-C  divalproex  (DEPAKOTE ) 250 MG DR tablet Take 3 tablets (750 mg total) by mouth every 12 (twelve) hours. Patient taking  differently: Take 750 mg by mouth 2 (two) times daily. 11/18/15   Vachhani, Vaibhavkumar, MD  feeding supplement, ENSURE ENLIVE, (ENSURE ENLIVE) LIQD Take 237 mLs by mouth 3 (three) times daily between meals. 09/15/16   Hower, Alm POUR, MD  folic acid  (FOLVITE ) 1 MG tablet Take 1 tablet (1 mg total) by mouth daily. 10/01/20   Awanda City, MD  levETIRAcetam  (KEPPRA ) 500 MG tablet Take 1 tablet (500 mg total) by mouth 2 (two) times daily. 10/01/20 06/26/22  Awanda City, MD  levETIRAcetam  (KEPPRA ) 500 MG tablet Take 1 tablet (500 mg total) by mouth 2 (two) times daily for 3 days. 10/01/20 06/26/22  Awanda City, MD  lisinopril  (ZESTRIL ) 5 MG tablet Hold this medicine until followup with outpatient doctor because your blood pressure was normal in the hospital without it. 10/01/20   Awanda City, MD  ondansetron  (ZOFRAN -ODT) 4 MG disintegrating tablet Take 1 tablet (4 mg total) by mouth every 8 (eight) hours as needed for nausea or vomiting. 12/17/21   Cuthriell, Dorn BIRCH, PA-C  predniSONE  (DELTASONE ) 50 MG tablet Take 1 tablet (50 mg total) by mouth daily with breakfast. 12/17/21   Cuthriell, Dorn BIRCH, PA-C  propranolol  (INDERAL ) 20 MG tablet Hold this medicine until followup with outpatient doctor because your heart rate was already low in the hospital without it. 10/01/20   Awanda City, MD  sodium chloride  1 g tablet Take 2 tablets (2 g total) by mouth 3 (three) times daily with meals. 10/01/20   Awanda City, MD  sulfamethoxazole -trimethoprim  (BACTRIM ) 400-80 MG tablet Take 1 tablet by mouth 2 (two) times daily. 06/24/21   Antonette Angeline ORN, NP  tamsulosin  (FLOMAX ) 0.4 MG CAPS capsule Take 1 capsule (0.4 mg total) by mouth at bedtime. 10/01/20   Awanda City, MD    Physical Exam: Vitals:   08/26/24 1927 08/26/24 1929 08/26/24 2003 08/26/24 2126  BP:    (!) 132/116  Pulse:  92    Resp:  20    Temp:   98.2 F (36.8 C)   TempSrc:   Oral   SpO2:  98%    Weight: 54.4 kg     Height: 5' 3 (1.6 m)      Physical Exam Vitals  and nursing note reviewed.  Constitutional:      General: He is not in acute distress. HENT:     Head: Normocephalic and atraumatic.  Cardiovascular:     Rate and Rhythm: Normal rate and regular rhythm.     Heart sounds: Normal heart sounds.  Pulmonary:     Effort: Pulmonary effort is normal.     Breath sounds: Normal breath sounds.  Abdominal:     Palpations: Abdomen is soft.     Tenderness: There is no abdominal tenderness.  Neurological:     Mental Status: Mental status is at baseline.     Comments:  Essential tremor mostly affecting left arm     Labs on Admission: I have personally reviewed following labs and imaging studies  CBC: Recent Labs  Lab 08/26/24 1926  WBC 6.1  NEUTROABS 4.3  HGB 8.5*  HCT 25.5*  MCV 102.8*  PLT 114*   Basic Metabolic Panel: Recent Labs  Lab 08/26/24 1926  NA 139  K 3.3*  CL 102  CO2 22  GLUCOSE 175*  BUN 42*  CREATININE 2.02*  CALCIUM  8.5*   GFR: Estimated Creatinine Clearance: 28.1 mL/min (A) (by C-G formula based on SCr of 2.02 mg/dL (H)). Liver Function Tests: Recent Labs  Lab 08/26/24 1926  AST 58*  ALT 17  ALKPHOS 32*  BILITOT 0.8  PROT 5.8*  ALBUMIN 3.1*   No results for input(s): LIPASE, AMYLASE in the last 168 hours. No results for input(s): AMMONIA in the last 168 hours. Coagulation Profile: Recent Labs  Lab 08/26/24 1926  INR 1.0   Cardiac Enzymes: No results for input(s): CKTOTAL, CKMB, CKMBINDEX, TROPONINI in the last 168 hours. BNP (last 3 results) No results for input(s): PROBNP in the last 8760 hours. HbA1C: No results for input(s): HGBA1C in the last 72 hours. CBG: No results for input(s): GLUCAP in the last 168 hours. Lipid Profile: No results for input(s): CHOL, HDL, LDLCALC, TRIG, CHOLHDL, LDLDIRECT in the last 72 hours. Thyroid  Function Tests: No results for input(s): TSH, T4TOTAL, FREET4, T3FREE, THYROIDAB in the last 72 hours. Anemia Panel: No  results for input(s): VITAMINB12, FOLATE, FERRITIN, TIBC, IRON, RETICCTPCT in the last 72 hours. Urine analysis:    Component Value Date/Time   COLORURINE AMBER (A) 09/16/2020 2223   APPEARANCEUR CLEAR (A) 09/16/2020 2223   APPEARANCEUR Clear 07/26/2014 1925   LABSPEC 1.025 09/16/2020 2223   LABSPEC 1.006 07/26/2014 1925   PHURINE 5.0 09/16/2020 2223   GLUCOSEU NEGATIVE 09/16/2020 2223   GLUCOSEU Negative 07/26/2014 1925   HGBUR NEGATIVE 09/16/2020 2223   BILIRUBINUR SMALL (A) 09/16/2020 2223   BILIRUBINUR Negative 07/26/2014 1925   KETONESUR 5 (A) 09/16/2020 2223   PROTEINUR NEGATIVE 09/16/2020 2223   NITRITE NEGATIVE 09/16/2020 2223   LEUKOCYTESUR NEGATIVE 09/16/2020 2223   LEUKOCYTESUR Negative 07/26/2014 1925    Radiological Exams on Admission: CT Angio Chest PE W/Cm &/Or Wo Cm Result Date: 08/26/2024 EXAM: CTA of the Chest with contrast for PE 08/26/2024 09:05:01 PM TECHNIQUE: CTA of the chest was performed after the administration of intravenous contrast. Multiplanar reformatted images are provided for review. MIP images are provided for review. Automated exposure control, iterative reconstruction, and/or weight based adjustment of the mA/kV was utilized to reduce the radiation dose to as low as reasonably achievable. COMPARISON: 09/23/2020 CLINICAL HISTORY: Pulmonary embolism (PE) suspected, high probability. Patient found on the floor by EMS with chest pain, atrial fibrillation with rapid ventricular response, hypotension, and hypoxia. Initial blood pressure was 74/48. FINDINGS: PULMONARY ARTERIES: Pulmonary arteries are adequately opacified for evaluation. No pulmonary embolism. Main pulmonary artery is normal in caliber. MEDIASTINUM: The heart and pericardium demonstrate no acute abnormality. No pericardial effusion. There is no acute abnormality of the thoracic aorta. Coronary artery and aortic atherosclerotic calcification. LYMPH NODES: No mediastinal, hilar or  axillary lymphadenopathy. LUNGS AND PLEURA: The lungs are without acute process. No focal consolidation or pulmonary edema. No pleural effusion or pneumothorax. UPPER ABDOMEN: Limited images of the upper abdomen are unremarkable. SOFT TISSUES AND BONES: No acute bone or soft tissue abnormality. IMPRESSION: 1. No pulmonary embolism. 2. No acute pulmonary abnormality. Electronically signed  by: Norman Gatlin MD 08/26/2024 09:10 PM EDT RP Workstation: [id number]   DG Chest 1 View Result Date: 08/26/2024 CLINICAL DATA:  Shortness of breath. EXAM: CHEST  1 VIEW COMPARISON:  Chest radiograph dated 12/17/2021. FINDINGS: The heart size and mediastinal contours are within normal limits. Both lungs are clear. The visualized skeletal structures are unremarkable. IMPRESSION: No active disease. Electronically Signed   By: Vanetta Chou M.D.   On: 08/26/2024 19:59   Data Reviewed for HPI: Relevant notes from primary care and specialist visits, past discharge summaries as available in EHR, including Care Everywhere. Prior diagnostic testing as pertinent to current admission diagnoses Updated medications and problem lists for reconciliation ED course, including vitals, labs, imaging, treatment and response to treatment Triage notes, nursing and pharmacy notes and ED provider's notes Notable results as noted above in HPI      Assessment and Plan: * Atrial flutter with rapid ventricular response (HCC) Improved with diltiazem  bolus given by EMS Started on oral Cardizem  in the ED Continue oral Cardizem  with close monitoring of blood pressures CHA2DS2-VASc score is now 2.  Holding off on anticoagulation while working up possible acute anemia Given hypotension with EMS, will hold other antihypertensives Continuous cardiac monitoring Echocardiogram to evaluate left ventricular systolic function given elevated BNP of 341(chest x-ray without edema)  Dyspnea on exertion Unclear etiology but likely related to  rapid A-flutter and symptomatic anemia, possible dehydration, possible cardiomyopathy BNP was elevated to the 300s and patient had lower extremity edema Echocardiogram to evaluate left ventricular systolic function Daily weights with intake and output monitoring  Acute hypotension Suspect related to rapid A-flutter, possibly some hypovolemia SBP with EMS in the 70s, improving to the 80s with fluid bolus now better at 141/122 at admission Will evaluate for blood loss given low hemoglobin Continue as needed fluid boluses Will hold antihypertensives Continue oral Cardizem  if BP tolerates Addendum: Patient persistently hypotensive since admission. -Holding Cardizem  -Additional fluid boluses given -Midodrine  10 mg x 1 Addendum #2( 5:30am 08/27/24): remains hypotensive, without tachycardia ? Circulatory shock, possibly medication induced --Discussed with intensivist and consult placed -Lactic acid ordered per intensivist request    08/27/2024    5:20 AM 08/27/2024    4:30 AM 08/27/2024    4:08 AM  Vitals with BMI  Systolic 80 66 78  Diastolic 50 47 44  Pulse 58 63      Postural dizziness with presyncope Patient with weakness and near fall, symptomatic tachyarrhythmia with hypotension, superimposed on ?ymptomatic anemia Continuous cardiac monitoring, echocardiogram Neurologic checks with fall and aspiration precautions  Chest pain due to myocardial ischemia Elevated troponin 24 History of negative stress test 2022 Possible related to rapid A-flutter in combination with symptomatic anemia Will get echocardiogram to evaluate for wall motion abnormality Continue to trend troponins Trend hemoglobin Cardiology consult  Acute respiratory failure with hypoxia (HCC) Was reportedly hypoxic to the 80s with EMS improved with O2 at 4 L, weaned off in the ED Continue to monitor  Acute on chronic anemia of CKD lllb Hemoglobin 8.5, down from baseline of 10.7 a couple months prior Last  colonoscopy 2023 with a polyp and internal hemorrhoids Last EGD 2017 -Will get anemia panel -Serial H&H and transfuse if necessary  Acute renal failure superimposed on stage 3b chronic kidney disease (HCC) Suspect related to hypotension Will continue as needed fluid boluses Monitor renal function and avoid nephrotoxins Consider nephrology consult if not improving  Hyperparathyroidism (HCC) Recent PTH intact elevated at 179 Being managed outpatient by  nephrology  Seizure disorder (HCC) Continue Depakote  and Keppra   Essential hypertension Holding antihypertensives except for Cardizem  due to hypotension on arrival of EMS  Essential tremor On as needed propranolol , holding due to hypotension    DVT prophylaxis: Lovenox   Consults: Cardiology  Advance Care Planning:   Code Status: Full Code   Family Communication: none  Disposition Plan: Back to previous home environment  Severity of Illness: The appropriate patient status for this patient is OBSERVATION. Observation status is judged to be reasonable and necessary in order to provide the required intensity of service to ensure the patient's safety. The patient's presenting symptoms, physical exam findings, and initial radiographic and laboratory data in the context of their medical condition is felt to place them at decreased risk for further clinical deterioration. Furthermore, it is anticipated that the patient will be medically stable for discharge from the hospital within 2 midnights of admission.   CRITICAL CARE Performed by: Delayne LULLA Solian   Total critical care time: 100 minutes  Critical care time was exclusive of separately billable procedures and treating other patients.  Critical care was necessary to treat or prevent imminent or life-threatening deterioration.  Critical care was time spent personally by me on the following activities: development of treatment plan with patient and/or surrogate as well as nursing,  discussions with consultants, evaluation of patient's response to treatment, examination of patient, obtaining history from patient or surrogate, ordering and performing treatments and interventions, ordering and review of laboratory studies, ordering and review of radiographic studies, pulse oximetry and re-evaluation of patient's condition.   Author: Delayne LULLA Solian, MD 08/26/2024 10:40 PM  For on call review [email or web address].

## 2024-08-27 ENCOUNTER — Inpatient Hospital Stay
Admit: 2024-08-27 | Discharge: 2024-08-27 | Disposition: A | Discharge status: Home / Self Care | Attending: Internal Medicine

## 2024-08-27 DIAGNOSIS — R7989 Other specified abnormal findings of blood chemistry: Secondary | ICD-10-CM

## 2024-08-27 DIAGNOSIS — N1832 Chronic kidney disease, stage 3b: Secondary | ICD-10-CM | POA: Diagnosis present

## 2024-08-27 DIAGNOSIS — I10 Essential (primary) hypertension: Secondary | ICD-10-CM | POA: Diagnosis not present

## 2024-08-27 DIAGNOSIS — X58XXXA Exposure to other specified factors, initial encounter: Secondary | ICD-10-CM | POA: Diagnosis present

## 2024-08-27 DIAGNOSIS — I259 Chronic ischemic heart disease, unspecified: Secondary | ICD-10-CM | POA: Diagnosis not present

## 2024-08-27 DIAGNOSIS — Z79899 Other long term (current) drug therapy: Secondary | ICD-10-CM | POA: Diagnosis not present

## 2024-08-27 DIAGNOSIS — E785 Hyperlipidemia, unspecified: Secondary | ICD-10-CM | POA: Diagnosis present

## 2024-08-27 DIAGNOSIS — D61818 Other pancytopenia: Secondary | ICD-10-CM | POA: Diagnosis not present

## 2024-08-27 DIAGNOSIS — R338 Other retention of urine: Secondary | ICD-10-CM | POA: Diagnosis present

## 2024-08-27 DIAGNOSIS — Z7982 Long term (current) use of aspirin: Secondary | ICD-10-CM | POA: Diagnosis not present

## 2024-08-27 DIAGNOSIS — Z888 Allergy status to other drugs, medicaments and biological substances status: Secondary | ICD-10-CM | POA: Diagnosis not present

## 2024-08-27 DIAGNOSIS — D649 Anemia, unspecified: Secondary | ICD-10-CM | POA: Diagnosis not present

## 2024-08-27 DIAGNOSIS — J9601 Acute respiratory failure with hypoxia: Secondary | ICD-10-CM | POA: Diagnosis present

## 2024-08-27 DIAGNOSIS — G250 Essential tremor: Secondary | ICD-10-CM | POA: Diagnosis present

## 2024-08-27 DIAGNOSIS — E213 Hyperparathyroidism, unspecified: Secondary | ICD-10-CM | POA: Diagnosis not present

## 2024-08-27 DIAGNOSIS — I4892 Unspecified atrial flutter: Secondary | ICD-10-CM | POA: Diagnosis present

## 2024-08-27 DIAGNOSIS — E875 Hyperkalemia: Secondary | ICD-10-CM | POA: Diagnosis present

## 2024-08-27 DIAGNOSIS — T447X1A Poisoning by beta-adrenoreceptor antagonists, accidental (unintentional), initial encounter: Secondary | ICD-10-CM | POA: Diagnosis present

## 2024-08-27 DIAGNOSIS — I129 Hypertensive chronic kidney disease with stage 1 through stage 4 chronic kidney disease, or unspecified chronic kidney disease: Secondary | ICD-10-CM | POA: Diagnosis present

## 2024-08-27 DIAGNOSIS — R579 Shock, unspecified: Secondary | ICD-10-CM | POA: Diagnosis not present

## 2024-08-27 DIAGNOSIS — R570 Cardiogenic shock: Secondary | ICD-10-CM | POA: Diagnosis present

## 2024-08-27 DIAGNOSIS — R0602 Shortness of breath: Secondary | ICD-10-CM | POA: Diagnosis present

## 2024-08-27 DIAGNOSIS — G40909 Epilepsy, unspecified, not intractable, without status epilepticus: Secondary | ICD-10-CM | POA: Diagnosis present

## 2024-08-27 DIAGNOSIS — N401 Enlarged prostate with lower urinary tract symptoms: Secondary | ICD-10-CM | POA: Diagnosis present

## 2024-08-27 DIAGNOSIS — D6489 Other specified anemias: Secondary | ICD-10-CM | POA: Diagnosis not present

## 2024-08-27 DIAGNOSIS — Z7983 Long term (current) use of bisphosphonates: Secondary | ICD-10-CM | POA: Diagnosis not present

## 2024-08-27 DIAGNOSIS — N1330 Unspecified hydronephrosis: Secondary | ICD-10-CM | POA: Diagnosis not present

## 2024-08-27 DIAGNOSIS — I4891 Unspecified atrial fibrillation: Secondary | ICD-10-CM | POA: Diagnosis not present

## 2024-08-27 DIAGNOSIS — E876 Hypokalemia: Secondary | ICD-10-CM | POA: Diagnosis present

## 2024-08-27 DIAGNOSIS — I480 Paroxysmal atrial fibrillation: Secondary | ICD-10-CM | POA: Diagnosis present

## 2024-08-27 DIAGNOSIS — D631 Anemia in chronic kidney disease: Secondary | ICD-10-CM | POA: Diagnosis present

## 2024-08-27 DIAGNOSIS — I2489 Other forms of acute ischemic heart disease: Secondary | ICD-10-CM | POA: Diagnosis present

## 2024-08-27 DIAGNOSIS — D696 Thrombocytopenia, unspecified: Secondary | ICD-10-CM | POA: Diagnosis present

## 2024-08-27 DIAGNOSIS — I959 Hypotension, unspecified: Secondary | ICD-10-CM | POA: Diagnosis not present

## 2024-08-27 DIAGNOSIS — I471 Supraventricular tachycardia, unspecified: Secondary | ICD-10-CM | POA: Diagnosis present

## 2024-08-27 DIAGNOSIS — N179 Acute kidney failure, unspecified: Secondary | ICD-10-CM | POA: Diagnosis present

## 2024-08-27 DIAGNOSIS — Z8249 Family history of ischemic heart disease and other diseases of the circulatory system: Secondary | ICD-10-CM | POA: Diagnosis not present

## 2024-08-27 LAB — TROPONIN I (HIGH SENSITIVITY): Troponin I (High Sensitivity): 25 ng/L — ABNORMAL HIGH (ref <18)

## 2024-08-27 LAB — CBC WITH DIFFERENTIAL/PLATELET
Abs Immature Granulocytes: 0.03 K/uL (ref 0.00–0.07)
Basophils Absolute: 0 K/uL (ref 0.0–0.1)
Basophils Relative: 0 %
Eosinophils Absolute: 0.2 K/uL (ref 0.0–0.5)
Eosinophils Relative: 3 %
HCT: 22.5 % — ABNORMAL LOW (ref 39.0–52.0)
Hemoglobin: 7.7 g/dL — ABNORMAL LOW (ref 13.0–17.0)
Immature Granulocytes: 1 %
Lymphocytes Relative: 31 %
Lymphs Abs: 1.6 K/uL (ref 0.7–4.0)
MCH: 34.2 pg — ABNORMAL HIGH (ref 26.0–34.0)
MCHC: 34.2 g/dL (ref 30.0–36.0)
MCV: 100 fL (ref 80.0–100.0)
Monocytes Absolute: 0.7 K/uL (ref 0.1–1.0)
Monocytes Relative: 14 %
Neutro Abs: 2.7 K/uL (ref 1.7–7.7)
Neutrophils Relative %: 51 %
Platelets: 86 K/uL — ABNORMAL LOW (ref 150–400)
RBC: 2.25 MIL/uL — ABNORMAL LOW (ref 4.22–5.81)
RDW: 17.2 % — ABNORMAL HIGH (ref 11.5–15.5)
WBC: 5.2 K/uL (ref 4.0–10.5)
nRBC: 0 % (ref 0.0–0.2)

## 2024-08-27 LAB — HEMOGLOBIN AND HEMATOCRIT, BLOOD
HCT: 21.4 % — ABNORMAL LOW (ref 39.0–52.0)
HCT: 21.7 % — ABNORMAL LOW (ref 39.0–52.0)
Hemoglobin: 7.3 g/dL — ABNORMAL LOW (ref 13.0–17.0)
Hemoglobin: 7.2 g/dL — ABNORMAL LOW (ref 13.0–17.0)

## 2024-08-27 LAB — CBC
HCT: 23.8 % — ABNORMAL LOW (ref 39.0–52.0)
Hemoglobin: 8.2 g/dL — ABNORMAL LOW (ref 13.0–17.0)
MCH: 34.6 pg — ABNORMAL HIGH (ref 26.0–34.0)
MCHC: 34.5 g/dL (ref 30.0–36.0)
MCV: 100.4 fL — ABNORMAL HIGH (ref 80.0–100.0)
Platelets: 83 K/uL — ABNORMAL LOW (ref 150–400)
RBC: 2.37 MIL/uL — ABNORMAL LOW (ref 4.22–5.81)
RDW: 17.1 % — ABNORMAL HIGH (ref 11.5–15.5)
WBC: 6.3 K/uL (ref 4.0–10.5)
nRBC: 0 % (ref 0.0–0.2)

## 2024-08-27 LAB — ECHOCARDIOGRAM COMPLETE
AR max vel: 2.63 cm2
AV Area VTI: 2.39 cm2
AV Area mean vel: 2.5 cm2
AV Mean grad: 2 mmHg
AV Peak grad: 4.8 mmHg
Ao pk vel: 1.09 m/s
Area-P 1/2: 4.8 cm2
Calc EF: 56.6 %
Height: 63 in
MV VTI: 1.97 cm2
S' Lateral: 2.2 cm
Single Plane A2C EF: 58.3 %
Single Plane A4C EF: 57 %
Weight: 2091.72 [oz_av]

## 2024-08-27 LAB — MAGNESIUM: Magnesium: 1.7 mg/dL (ref 1.7–2.4)

## 2024-08-27 LAB — PHOSPHORUS
Phosphorus: 1.5 mg/dL — ABNORMAL LOW (ref 2.5–4.6)
Phosphorus: 4.5 mg/dL (ref 2.5–4.6)

## 2024-08-27 LAB — URINALYSIS, ROUTINE W REFLEX MICROSCOPIC
Bilirubin Urine: NEGATIVE
Glucose, UA: NEGATIVE mg/dL
Hgb urine dipstick: NEGATIVE
Ketones, ur: NEGATIVE mg/dL
Leukocytes,Ua: NEGATIVE
Nitrite: NEGATIVE
Protein, ur: NEGATIVE mg/dL
Specific Gravity, Urine: 1.024 (ref 1.005–1.030)
pH: 6 (ref 5.0–8.0)

## 2024-08-27 LAB — VITAMIN B12: Vitamin B-12: 3077 pg/mL — ABNORMAL HIGH (ref 180–914)

## 2024-08-27 LAB — TECHNOLOGIST SMEAR REVIEW: Plt Morphology: NORMAL

## 2024-08-27 LAB — BASIC METABOLIC PANEL WITH GFR
Anion gap: 8 (ref 5–15)
BUN: 37 mg/dL — ABNORMAL HIGH (ref 8–23)
CO2: 25 mmol/L (ref 22–32)
Calcium: 7.9 mg/dL — ABNORMAL LOW (ref 8.9–10.3)
Chloride: 108 mmol/L (ref 98–111)
Creatinine, Ser: 1.78 mg/dL — ABNORMAL HIGH (ref 0.61–1.24)
GFR, Estimated: 42 mL/min — ABNORMAL LOW (ref >60)
Glucose, Bld: 134 mg/dL — ABNORMAL HIGH (ref 70–99)
Potassium: 2.9 mmol/L — ABNORMAL LOW (ref 3.5–5.1)
Sodium: 141 mmol/L (ref 135–145)

## 2024-08-27 LAB — MRSA NEXT GEN BY PCR, NASAL: MRSA by PCR Next Gen: NOT DETECTED

## 2024-08-27 LAB — GLUCOSE, CAPILLARY: Glucose-Capillary: 121 mg/dL — ABNORMAL HIGH (ref 70–99)

## 2024-08-27 LAB — HIV ANTIBODY (ROUTINE TESTING W REFLEX): HIV Screen 4th Generation wRfx: NONREACTIVE

## 2024-08-27 LAB — T4, FREE: Free T4: 1.29 ng/dL — ABNORMAL HIGH (ref 0.61–1.12)

## 2024-08-27 LAB — RETICULOCYTES
Immature Retic Fract: 12.1 % (ref 2.3–15.9)
RBC.: 2.24 MIL/uL — ABNORMAL LOW (ref 4.22–5.81)
Retic Count, Absolute: 12.1 K/uL — ABNORMAL LOW (ref 19.0–186.0)
Retic Ct Pct: 0.5 % (ref 0.4–3.1)

## 2024-08-27 LAB — PATHOLOGIST SMEAR REVIEW

## 2024-08-27 LAB — LACTIC ACID, PLASMA
Lactic Acid, Venous: 2.2 mmol/L (ref 0.5–1.9)
Lactic Acid, Venous: 1.7 mmol/L (ref 0.5–1.9)

## 2024-08-27 LAB — LACTATE DEHYDROGENASE: LDH: 155 U/L (ref 98–192)

## 2024-08-27 LAB — TSH: TSH: 1.428 u[IU]/mL (ref 0.350–4.500)

## 2024-08-27 LAB — HEMOGLOBIN: Hemoglobin: 6.8 g/dL — ABNORMAL LOW (ref 13.0–17.0)

## 2024-08-27 MED ORDER — GLUCAGON HCL RDNA (DIAGNOSTIC) 1 MG IJ SOLR
5.0000 mg | Freq: Once | INTRAVENOUS | Status: AC
  Administered 2024-08-27: 5 mg via INTRAVENOUS
  Filled 2024-08-27: qty 5

## 2024-08-27 MED ORDER — FOLIC ACID 1 MG PO TABS
1.0000 mg | ORAL_TABLET | Freq: Every day | ORAL | Status: DC
  Administered 2024-08-27 – 2024-09-05 (×10): 1 mg via ORAL
  Filled 2024-08-27 (×10): qty 1

## 2024-08-27 MED ORDER — ORAL CARE MOUTH RINSE: 15.0000 mL | OROMUCOSAL | Status: DC | PRN

## 2024-08-27 MED ORDER — CALCIUM GLUCONATE-NACL 1-0.675 GM/50ML-% IV SOLN
1.0000 g | Freq: Once | INTRAVENOUS | Status: AC
  Administered 2024-08-27: 1000 mg via INTRAVENOUS
  Filled 2024-08-27: qty 50

## 2024-08-27 MED ORDER — SODIUM CHLORIDE 0.9 % IV SOLN
250.0000 mL | INTRAVENOUS | Status: AC
  Administered 2024-08-27: 250 mL via INTRAVENOUS

## 2024-08-27 MED ORDER — GLUCAGON HCL RDNA (DIAGNOSTIC) 1 MG IJ SOLR
1.0000 mg/h | INTRAMUSCULAR | Status: DC
  Administered 2024-08-27 – 2024-08-28 (×2): 1 mg/h via INTRAVENOUS
  Filled 2024-08-27 (×4): qty 5

## 2024-08-27 MED ORDER — MIDODRINE HCL 5 MG PO TABS
10.0000 mg | ORAL_TABLET | Freq: Once | ORAL | Status: AC
  Administered 2024-08-27: 10 mg via ORAL
  Filled 2024-08-27: qty 2

## 2024-08-27 MED ORDER — CHLORHEXIDINE GLUCONATE CLOTH 2 % EX PADS
6.0000 | MEDICATED_PAD | Freq: Every day | CUTANEOUS | Status: DC
  Administered 2024-08-27 – 2024-09-01 (×6): 6 via TOPICAL

## 2024-08-27 MED ORDER — POTASSIUM CHLORIDE CRYS ER 20 MEQ PO TBCR
20.0000 meq | EXTENDED_RELEASE_TABLET | Freq: Once | ORAL | Status: AC
  Administered 2024-08-27: 20 meq via ORAL
  Filled 2024-08-27: qty 1

## 2024-08-27 MED ORDER — SODIUM CHLORIDE 0.9 % IV BOLUS
500.0000 mL | Freq: Once | INTRAVENOUS | Status: AC
  Administered 2024-08-27: 500 mL via INTRAVENOUS

## 2024-08-27 MED ORDER — MAGNESIUM SULFATE 2 GM/50ML IV SOLN
2.0000 g | Freq: Once | INTRAVENOUS | Status: AC
  Administered 2024-08-27: 2 g via INTRAVENOUS
  Filled 2024-08-27: qty 50

## 2024-08-27 MED ORDER — DEXTROSE 5 % IV SOLN
45.0000 mmol | Freq: Once | INTRAVENOUS | Status: AC
  Administered 2024-08-27: 45 mmol via INTRAVENOUS
  Filled 2024-08-27: qty 15

## 2024-08-27 MED ORDER — PERFLUTREN LIPID MICROSPHERE
1.0000 mL | INTRAVENOUS | Status: AC | PRN
  Administered 2024-08-27: 2 mL via INTRAVENOUS

## 2024-08-27 MED ORDER — PHENYLEPHRINE HCL-NACL 20-0.9 MG/250ML-% IV SOLN
0.0000 ug/min | INTRAVENOUS | Status: DC
  Administered 2024-08-27 (×2): 65 ug/min via INTRAVENOUS
  Administered 2024-08-27: 25 ug/min via INTRAVENOUS
  Administered 2024-08-28: 15 ug/min via INTRAVENOUS
  Filled 2024-08-27 (×5): qty 250

## 2024-08-27 MED ORDER — PANTOPRAZOLE SODIUM 40 MG IV SOLR
40.0000 mg | Freq: Two times a day (BID) | INTRAVENOUS | Status: DC
  Administered 2024-08-27 – 2024-09-01 (×11): 40 mg via INTRAVENOUS
  Filled 2024-08-27 (×11): qty 10

## 2024-08-27 NOTE — Consult Note (Addendum)
 [facility] CLINIC CARDIOLOGY CONSULT NOTE       Patient ID: [name] MRN: [id number] DOB/AGE: [birth year] 63 y.o.  Admit date: 08/26/2024 Referring Physician Dr. Parris Primary Physician Glover Lenis, MD Primary Cardiologist Dr. Ammon Reason for Consultation AFL RVR  HPI: [name] is a 63 y.o. male  with a past medical history of paroxysmal atrial fibrillation, hypertension, hyperlipidemia, CKD who presented to the ED on 08/26/2024 for intermittent palpitations a/w SOB for the past few days.  On arrival to ED patient found to be in atrial fibrillation RVR  rate 140s. IV Cardizem  initiated, led to hypotension requiring pressors. Cardiology was consulted for AF RVR.  Work up in the ED notable for sodium 139, potassium 3.3, creatinine 2.02, albumin 3.1, hemoglobin 8.5, platelets 114.  LFTs mildly elevated.  BNP elevated at 340.  CXR with no acute cardiopulmonary disease.  CTA without evidence of pulmonary embolism.  Troponins minimally elevated and flat 24 > 23 > 25. EKG (09/10) with atrial fibrillation, rates 97 bpm.  Repeat EKG on 09/11 with sinus rhythm, rate 57 bpm.  Initially started on IV Cardizem  drip in ED that resulted in hypotension.  Patient required pressors for soft BP and PCCM consulted.  Electrolytes replaced.  At the time of my evaluation this AM, patient was resting comfortably in hospital bed. We discussed patients sxs in further detail. Patient states on Friday he started to have intermittent palpitations a/w SOB and patient states feels like in AF. Patient states the palpitations come on randomly, not only associated with exertion. Patient denies any chest pressure, lightheadedness/dizziness or syncope. Patient states on Friday he took an extra propanolol when he felt palpitations and that helped. Patient states he stopped taking metoprolol  because he has slow HR. Patient denies hx of MI or stroke.  Pertinent Cardiac History (Most recent) Stress test  (09/29/2021) Regional wall motion:  reveals normal myocardial thickening and wall  motion.  The overall quality of the study is good.   Artifacts noted: no  Left ventricular cavity: normal.   Review of systems complete and found to be negative unless listed above    Past Medical History:  Diagnosis Date   Anemia    GERD (gastroesophageal reflux disease)    Hypertension    Hyponatremia    chronic   Kidney disease    Migraine    Seizures (HCC)     Past Surgical History:  Procedure Laterality Date   COLONOSCOPY WITH PROPOFOL  N/A 05/01/2016   Procedure: COLONOSCOPY WITH PROPOFOL ;  Surgeon: Rogelia Copping, MD;  Location: [facility] ENDOSCOPY;  Service: Endoscopy;  Laterality: N/A;   COLONOSCOPY WITH PROPOFOL  N/A 06/26/2022   Procedure: COLONOSCOPY WITH PROPOFOL ;  Surgeon: Copping Rogelia, MD;  Location: [facility] ENDOSCOPY;  Service: Endoscopy;  Laterality: N/A;   ESOPHAGOGASTRODUODENOSCOPY (EGD) WITH PROPOFOL  N/A 05/01/2016   Procedure: ESOPHAGOGASTRODUODENOSCOPY (EGD) WITH PROPOFOL ;  Surgeon: Rogelia Copping, MD;  Location: [facility] ENDOSCOPY;  Service: Endoscopy;  Laterality: N/A;   none      Medications Prior to Admission  Medication Sig Dispense Refill Last Dose/Taking   alendronate  (FOSAMAX ) 70 MG tablet Take 70 mg by mouth once a week. Take with a full glass of water on an empty stomach.   08/21/2024   atorvastatin  (LIPITOR ) 80 MG tablet Take 80 mg by mouth at bedtime.   08/25/2024   divalproex  (DEPAKOTE ) 250 MG DR tablet Take 3 tablets (750 mg total) by mouth every 12 (twelve) hours. (Patient taking differently: Take 750 mg by mouth 2 (two)  times daily.) 100 tablet 1 08/26/2024 Morning   levETIRAcetam  (KEPPRA ) 500 MG tablet Take 1 tablet (500 mg total) by mouth 2 (two) times daily. 60 tablet 2 08/26/2024 Morning   lisinopril  (ZESTRIL ) 5 MG tablet Hold this medicine until followup with outpatient doctor because your blood pressure was normal in the hospital without it.   08/26/2024 Morning   propranolol   (INDERAL ) 20 MG tablet Hold this medicine until followup with outpatient doctor because your heart rate was already low in the hospital without it. (Patient taking differently: Take 20 mg by mouth 2 (two) times daily. Hold this medicine until followup with outpatient doctor because your heart rate was already low in the hospital without it.)   08/26/2024 Morning   tamsulosin  (FLOMAX ) 0.4 MG CAPS capsule Take 1 capsule (0.4 mg total) by mouth at bedtime.   08/25/2024   feeding supplement, ENSURE ENLIVE, (ENSURE ENLIVE) LIQD Take 237 mLs by mouth 3 (three) times daily between meals. 237 mL 12    Social History   Socioeconomic History   Marital status: Single    Spouse name: Not on file   Number of children: Not on file   Years of education: Not on file   Highest education level: Not on file  Occupational History   Not on file  Tobacco Use   Smoking status: Never   Smokeless tobacco: Never  Vaping Use   Vaping status: Never Used  Substance and Sexual Activity   Alcohol use: No   Drug use: No   Sexual activity: Not on file  Other Topics Concern   Not on file  Social History Narrative   Cross dresses    Social Drivers of Health   Financial Resource Strain: Low Risk  (07/10/2023)   Received from [facility]   Overall Financial Resource Strain (CARDIA)    Difficulty of Paying Living Expenses: Not hard at all  Food Insecurity: No Food Insecurity (07/10/2023)   Received from [facility]   Hunger Vital Sign    Within the past 12 months, you worried that your food would run out before you got the money to buy more.: Never true    Within the past 12 months, the food you bought just didn't last and you didn't have money to get more.: Never true  Transportation Needs: No Transportation Needs (07/10/2023)   Received from [facility] - Transportation    In the past 12 months, has lack of transportation kept you from medical  appointments or from getting medications?: No    Lack of Transportation (Non-Medical): No  Physical Activity: Not on file  Stress: Not on file  Social Connections: Not on file  Intimate Partner Violence: Not on file    Family History  Problem Relation Age of Onset   Heart attack Father    Kidney disease Father      Vitals:   08/27/24 0815 08/27/24 0830 08/27/24 0845 08/27/24 0900  BP: 96/66 90/70 (!) 105/58 111/88  Pulse: (!) 48 (!) 51 (!) 54 68  Resp: 11 20 (!) 22 15  Temp: 98.1 F (36.7 C)     TempSrc: Oral     SpO2: 100% 100% 100% 100%  Weight:      Height:        PHYSICAL EXAM General: Chronically ill appearing elderly male, well nourished, in no acute distress. HEENT: Normocephalic and atraumatic. Neck: No JVD.   Lungs: Normal respiratory effort on room air.  Clear bilaterally to auscultation. No wheezes, crackles, rhonchi.  Heart: HRRR. Normal S1 and S2 without gallops or murmurs.  Abdomen: Non-distended appearing.  Msk: Normal strength and tone for age. Extremities: Warm and well perfused. No clubbing, cyanosis, edema.  Neuro: Alert and oriented X 3. Psych: Answers questions appropriately.   Labs: Basic Metabolic Panel: Recent Labs    08/26/24 1926 08/27/24 0522 08/27/24 0547  NA 139 141  --   K 3.3* 2.9*  --   CL 102 108  --   CO2 22 25  --   GLUCOSE 175* 134*  --   BUN 42* 37*  --   CREATININE 2.02* 1.78*  --   CALCIUM  8.5* 7.9*  --   MG  --   --  1.7  PHOS  --   --  1.5*   Liver Function Tests: Recent Labs    08/26/24 1926  AST 58*  ALT 17  ALKPHOS 32*  BILITOT 0.8  PROT 5.8*  ALBUMIN 3.1*   No results for input(s): LIPASE, AMYLASE in the last 72 hours. CBC: Recent Labs    08/26/24 1926 08/26/24 2300 08/27/24 0522 08/27/24 0636  WBC 6.1  --   --  6.3  NEUTROABS 4.3  --   --   --   HGB 8.5*   < > 6.8* 8.2*  HCT 25.5*  --   --  23.8*  MCV 102.8*  --   --  100.4*  PLT 114*  --   --  83*   < > = values in this interval not  displayed.   Cardiac Enzymes: Recent Labs    08/26/24 1926 08/26/24 2130 08/27/24 0547  TROPONINIHS 24* 23* 25*   BNP: Recent Labs    08/26/24 2013  BNP 341.0*   D-Dimer: No results for input(s): DDIMER in the last 72 hours. Hemoglobin A1C: No results for input(s): HGBA1C in the last 72 hours. Fasting Lipid Panel: No results for input(s): CHOL, HDL, LDLCALC, TRIG, CHOLHDL, LDLDIRECT in the last 72 hours. Thyroid  Function Tests: No results for input(s): TSH, T4TOTAL, T3FREE, THYROIDAB in the last 72 hours.  Invalid input(s): FREET3 Anemia Panel: Recent Labs    08/26/24 1926 08/26/24 2130 08/26/24 2300  VITAMINB12  --   --  3,077*  FOLATE  --  4.7*  --   FERRITIN  --  273  --   TIBC  --  274  --   IRON  --  150  --   RETICCTPCT 0.8  --   --      Radiology: CT Angio Chest PE W/Cm &/Or Wo Cm Result Date: 08/26/2024 EXAM: CTA of the Chest with contrast for PE 08/26/2024 09:05:01 PM TECHNIQUE: CTA of the chest was performed after the administration of intravenous contrast. Multiplanar reformatted images are provided for review. MIP images are provided for review. Automated exposure control, iterative reconstruction, and/or weight based adjustment of the mA/kV was utilized to reduce the radiation dose to as low as reasonably achievable. COMPARISON: 09/23/2020 CLINICAL HISTORY: Pulmonary embolism (PE) suspected, high probability. Patient found on the floor by EMS with chest pain, atrial fibrillation with rapid ventricular response, hypotension, and hypoxia. Initial blood pressure was 74/48. FINDINGS: PULMONARY ARTERIES: Pulmonary arteries are adequately opacified for evaluation. No pulmonary embolism. Main pulmonary artery is normal in caliber. MEDIASTINUM: The heart and pericardium demonstrate no acute abnormality. No pericardial effusion. There is no acute abnormality of the thoracic aorta. Coronary artery and aortic atherosclerotic calcification. LYMPH  NODES: No mediastinal,  hilar or axillary lymphadenopathy. LUNGS AND PLEURA: The lungs are without acute process. No focal consolidation or pulmonary edema. No pleural effusion or pneumothorax. UPPER ABDOMEN: Limited images of the upper abdomen are unremarkable. SOFT TISSUES AND BONES: No acute bone or soft tissue abnormality. IMPRESSION: 1. No pulmonary embolism. 2. No acute pulmonary abnormality. Electronically signed by: Norman Gatlin MD 08/26/2024 09:10 PM EDT RP Workstation: [id number]   DG Chest 1 View Result Date: 08/26/2024 CLINICAL DATA:  Shortness of breath. EXAM: CHEST  1 VIEW COMPARISON:  Chest radiograph dated 12/17/2021. FINDINGS: The heart size and mediastinal contours are within normal limits. Both lungs are clear. The visualized skeletal structures are unremarkable. IMPRESSION: No active disease. Electronically Signed   By: Vanetta Chou M.D.   On: 08/26/2024 19:59    ECHO ordered.  TELEMETRY reviewed by me 08/27/2024: sinus rhythm, rate 50s  EKG reviewed by me: (09/10) with atrial fibrillation, rates 97 bpm.   Repeat EKG on 09/11 with sinus rhythm, rate 57 bpm.   Data reviewed by me 08/27/2024: last 24h vitals tele labs imaging I/O ED provider note, admission H&P.  Principal Problem:   Atrial flutter with rapid ventricular response (HCC) Active Problems:   Chest pain due to myocardial ischemia   Essential tremor   Essential hypertension   Elevated troponin   Postural dizziness with presyncope   Acute on chronic anemia of CKD lllb   Anemia of chronic renal failure, stage 3b (HCC)   Seizure disorder (HCC)   Acute hypotension   Acute renal failure superimposed on stage 3b chronic kidney disease (HCC)   Acute respiratory failure with hypoxia (HCC)   Hyperparathyroidism (HCC)   Dyspnea on exertion   Shock circulatory (HCC)    ASSESSMENT AND PLAN:  [name] is a 63 y.o. male  with a past medical history of paroxysmal atrial fibrillation, hypertension,  hyperlipidemia, CKD who presented to the ED on 08/26/2024 for intermittent palpitations a/w SOB for the past few days.  On arrival to ED patient found to be in atrial fibrillation RVR  rate 140s. IV Cardizem  initiated, led to hypotension requiring pressors. Cardiology was consulted for AF RVR.  # Anemia/thrombocytopenia # Atrial fibrillation RVR # Paroxysmal atrial fibrillation # Hypokalemia -Monitor and replenish electrolytes for a goal K >4, Mag >2  -Hold off on rate control medication (BB, CCB) due to baseline bradycardia.  -Previously defer chronic anticoagulation with CHA2DS2VASc of 1. With age 69 yo and hx HTN CHA2DS2VASc score is at least 2, recommend PO Eliquis 2.5 mg BID for stroke risk reduction. Patient is agreeable and denies any recent falls, or prior major bleeding. With Hgb of 7.7, Plts 83 this AM, will hold off on starting until Hgb and platelets stabilizes.  -Plan to place cardiac monitor at discharge to further evaluation intermittent palpitations a/w SOB, likely in/out of AF.  -Will schedule patient to see outpatient EP for further evaluation, possibly discuss candidacy for ablation.   # Circulatory shock # Hypertension # Hyperlipidemia # Demand ischemia Patient without chest pain. Troponins minimally elevated and flat 24 > 23 > 25.  EKG without acute ischemic changes. - Echo pending. - Continue atorvastatin  80 mg daily. - Home antihypertensives (lisinopril  5 mg) held due to soft BP requiring pressors. - Continue IV phenylephrine  for soft BP.  Wean when able.  Recommend MAP > 65. Management per PCCM.  - Minimally elevated and flat troponins is most consistent with demand/supply mismatch and not ACS. No further cardiac diagnostics at this time.  This patient's plan of care was discussed and created with Dr. Ammon and he is in agreement.  Signed: Dorene Comfort, PA-C  08/27/2024, 9:43 AM [facility] Cardiology

## 2024-08-27 NOTE — Consult Note (Signed)
 NAME:  [name], MRN:  [id number], DOB:  1961/11/11, LOS: 0 ADMISSION DATE:  08/26/2024, CONSULTATION DATE:  08/27/24 REFERRING MD:  Dr. Cleatus, CHIEF COMPLAINT: Chest pain    History of Present Illness:  63 yo M presenting to [facility] ED from home via EMS for evaluation of chest pain.  History provided per chart review and patient bedside report. Patient was in his normal state of health until 08/23/24 when he felt something hard squeezing my chest as he was walking back to the house while taking out the trash. He was not sure if this was his A-fib or a sign of a heart attack and self-administered propanolol which he takes PRN for palpitations and lay down. His chest pain improved however over the next few days he experienced generalized fatigue with dyspnea on exertion as well as increased lower extremity edema. He called EMS was he was unable to to bear his own weight. He denies fever/ chills, abdominal pain/ nausea/ vomiting/ diarrhea, urinary symptoms, new cough/ congestion, signs of bleeding, falls or LOC.  EMS found him pale and diaphoretic, hypoxic in the 80's and in A-fib RVR 140's-150's with hypotension (BP: 74/48). They administered diltiazem  10 mg bolus and oxygen 4 L [place].   ED course: Upon arrival patient alert and responsive, tachycardic but otherwise stable vitals. Labs significant for hypokalemia, hyperglycemia, AKI on CKD, mild transaminitis, mildly elevated troponin and anemia as well as thrombocytopenia. Imaging unremarkable  Medications given: IV contrast, diltiazem  30 mg, 500 mL bolus Initial Vitals: 98.2, 19, 124, 141/122 & 98% on RA Significant labs: (Labs/ Imaging personally reviewed) I, Jenita Ruth Rust-Chester, AGACNP-BC, personally viewed and interpreted this ECG. EKG Interpretation: Date: 08/26/24, EKG Time: 19:26, Rate: 97, Rhythm: A-fib, QRS Axis:  normal, Intervals: normal, ST/T Wave abnormalities: none, Narrative Interpretation: Atrial Fibrillation Chemistry:  Na+:139, K+: 3.3, BUN/Cr.: 42/2.02, Serum CO2/ AG: 22/15, glucose: 175, AST: 58 Hematology: WBC: 6.1, Hgb: 8.5, platelets: 114,  Troponin: 24 > 23, BNP: 341, Lactic: pending,   VBG: 7.44/ 45/-/30.6 CXR 08/26/24: no active disease CT angio chest PE 08/26/24: no pulmonary embolism, no acute cardiopulmonary process.  PCCM consulted for assistance in management and monitoring due to circulatory shock suspect medication induced +/- cardiogenic etiology in the setting of A-fib RVR.  Pertinent  Medical History  HTN Seizure disorder Essential Tremor Chronic Anemia CKD 3b PAF in 2022 on ASA Hyponatremia  Significant Hospital Events: Including procedures, antibiotic start and stop dates in addition to other pertinent events   08/26/24: Admit to Inpatient with A-fib RVR treated with Diltiazem . Overnight patient developed refractory hypotension suspect medication induced +/- cardiogenic etiology requiring vasopressor support. PCCM consulted and patient transferred to ICU  Interim History / Subjective:  Patient alert and responsive with stable vitals on RA and 75 mg of phenylephrine . Plan of care discussed, all questions and concerns answered at this time.  Objective    Blood pressure (!) 80/50, pulse (!) 58, temperature 97.8 F (36.6 C), temperature source Oral, resp. rate 20, height 5' 3 (1.6 m), weight 54.4 kg, SpO2 100%.       No intake or output data in the 24 hours ending 08/27/24 0531 Filed Weights   08/26/24 1927  Weight: 54.4 kg    Examination: General: Adult male, acutely ill, lying in bed, NAD HEENT: MM pink/moist, anicteric, atraumatic, neck supple Neuro: A&O x 4, able to follow commands, PERRL +3, MAE, essential tremor present in BUE CV: s1s2 RRR, a-fib bradycardia on monitor, no r/m/g Pulm:  Regular, non labored on RA, breath sounds clear-BUL & diminished-BLL GI: soft, rounded, non tender, bs x 4 Skin: no rashes/lesions noted Extremities: warm/dry, pulses + 2 R/P, no  edema noted  Resolved problem list   Assessment and Plan  Circulatory Shock suspect medication induced +/- cardiogenic etiology Chronic / Paroxysmal Atrial Fibrillation with Rapid Ventricular Response- improved Chest Pain Mildly Elevated Troponin suspect secondary to demand ischemia Pre-syncope PMHx: HTN CHA2DS2-VASc score: 2 - consider Amiodarone vs low dose BB for rate control as diltiazem  appears to cause hypotension - hold off on systemic anticoagulation due to acute anemia - infectious work up to rule out sepsis: lactic, UA. CXR negative, no fever or leukocytosis, low suspicion will hold off on antibiotics at this time. - start Phenylephrine , wean as tolerated to maintain > 65 and SBP > 90 - f/u Echocardiogram - hold propanolol due to hypotension - Cardiology consulted, appreciate input - Continuous cardiac monitoring  Acute Kidney Injury superimposed on CKD stage 3b  Baseline Cr: 1.6- 1.7, Cr on admission:2.02 - Strict I/O's: alert provider if UOP < 0.5 mL/kg/hr - gentle IVF hydration  - Daily BMP, replace electrolytes PRN - Avoid nephrotoxic agents as able, ensure adequate renal perfusion - consider renal US   Seizure Disorder - continue outpatient regimen Depakote  & Keppra   Anemia  Mild Thrombocytopenia - Monitor for s/s of bleeding - PPI - Daily CBC, serial H&H - Transfuse for Hgb <8 - Monitor coag panel - Consider transfusion of platelets if < 10  Labs   CBC: Recent Labs  Lab 08/26/24 1926 08/26/24 2300  WBC 6.1  --   NEUTROABS 4.3  --   HGB 8.5* 8.1*  HCT 25.5*  --   MCV 102.8*  --   PLT 114*  --     Basic Metabolic Panel: Recent Labs  Lab 08/26/24 1926  NA 139  K 3.3*  CL 102  CO2 22  GLUCOSE 175*  BUN 42*  CREATININE 2.02*  CALCIUM  8.5*   GFR: Estimated Creatinine Clearance: 28.1 mL/min (A) (by C-G formula based on SCr of 2.02 mg/dL (H)). Recent Labs  Lab 08/26/24 1926  WBC 6.1    Liver Function Tests: Recent Labs  Lab  08/26/24 1926  AST 58*  ALT 17  ALKPHOS 32*  BILITOT 0.8  PROT 5.8*  ALBUMIN 3.1*   No results for input(s): LIPASE, AMYLASE in the last 168 hours. No results for input(s): AMMONIA in the last 168 hours.  ABG    Component Value Date/Time   PHART 7.42 11/01/2015 1348   PCO2ART 39 11/01/2015 1348   PO2ART 84 11/01/2015 1348   HCO3 30.6 (H) 08/26/2024 2013   O2SAT PENDING 08/26/2024 2013     Coagulation Profile: Recent Labs  Lab 08/26/24 1926  INR 1.0    Cardiac Enzymes: No results for input(s): CKTOTAL, CKMB, CKMBINDEX, TROPONINI in the last 168 hours.  HbA1C: Hgb A1c MFr Bld  Date/Time Value Ref Range Status  06/20/2020 05:02 AM 5.8 (H) 4.8 - 5.6 % Final    Comment:    (NOTE) Pre diabetes:          5.7%-6.4%  Diabetes:              >6.4%  Glycemic control for   <7.0% adults with diabetes   08/10/2019 12:53 AM 5.6 4.8 - 5.6 % Final    Comment:    (NOTE) Pre diabetes:          5.7%-6.4% Diabetes:              >  6.4% Glycemic control for   <7.0% adults with diabetes     CBG: No results for input(s): GLUCAP in the last 168 hours.  Review of Systems: Positives in BOLD  Gen: Denies fever, chills, weight change, fatigue, night sweats HEENT: Denies blurred vision, double vision, hearing loss, tinnitus, sinus congestion, rhinorrhea, sore throat, neck stiffness, dysphagia PULM: Denies shortness of breath, cough, sputum production, hemoptysis, wheezing CV: Denies chest pain, edema, orthopnea, paroxysmal nocturnal dyspnea, palpitations GI: Denies abdominal pain, nausea, vomiting, diarrhea, hematochezia, melena, constipation, change in bowel habits GU: Denies dysuria, hematuria, polyuria, oliguria, urethral discharge Endocrine: Denies hot or cold intolerance, polyuria, polyphagia or appetite change Derm: Denies rash, dry skin, scaling or peeling skin change Heme: Denies easy bruising, bleeding, bleeding gums Neuro: Denies headache, numbness,  weakness, slurred speech, loss of memory or consciousness  Past Medical History:  He,  has a past medical history of Anemia, GERD (gastroesophageal reflux disease), Hypertension, Hyponatremia, Kidney disease, Migraine, and Seizures (HCC).   Surgical History:   Past Surgical History:  Procedure Laterality Date   COLONOSCOPY WITH PROPOFOL  N/A 05/01/2016   Procedure: COLONOSCOPY WITH PROPOFOL ;  Surgeon: Rogelia Copping, MD;  Location: [facility] ENDOSCOPY;  Service: Endoscopy;  Laterality: N/A;   COLONOSCOPY WITH PROPOFOL  N/A 06/26/2022   Procedure: COLONOSCOPY WITH PROPOFOL ;  Surgeon: Copping Rogelia, MD;  Location: [facility] ENDOSCOPY;  Service: Endoscopy;  Laterality: N/A;   ESOPHAGOGASTRODUODENOSCOPY (EGD) WITH PROPOFOL  N/A 05/01/2016   Procedure: ESOPHAGOGASTRODUODENOSCOPY (EGD) WITH PROPOFOL ;  Surgeon: Rogelia Copping, MD;  Location: [facility] ENDOSCOPY;  Service: Endoscopy;  Laterality: N/A;   none       Social History:   reports that he has never smoked. He has never used smokeless tobacco. He reports that he does not drink alcohol and does not use drugs.   Family History:  His family history includes Heart attack in his father; Kidney disease in his father.   Allergies Allergies  Allergen Reactions   Enoxaparin  Itching    Per pt it makes my eyes steam   Nsaids Other (See Comments)    Kidney disease     Home Medications  Prior to Admission medications   Medication Sig Start Date End Date Taking? Authorizing Provider  alendronate  (FOSAMAX ) 70 MG tablet Take 70 mg by mouth once a week. Take with a full glass of water on an empty stomach.   Yes [provider]  atorvastatin  (LIPITOR ) 80 MG tablet Take 80 mg by mouth at bedtime. 08/23/20  Yes [provider]  divalproex  (DEPAKOTE ) 250 MG DR tablet Take 3 tablets (750 mg total) by mouth every 12 (twelve) hours. Patient taking differently: Take 750 mg by mouth 2 (two) times daily. 11/18/15  Yes Vachhani, Vaibhavkumar, MD  levETIRAcetam   (KEPPRA ) 500 MG tablet Take 1 tablet (500 mg total) by mouth 2 (two) times daily. 10/01/20 08/27/24 Yes Awanda City, MD  lisinopril  (ZESTRIL ) 5 MG tablet Hold this medicine until followup with outpatient doctor because your blood pressure was normal in the hospital without it. 10/01/20  Yes Awanda City, MD  propranolol  (INDERAL ) 20 MG tablet Hold this medicine until followup with outpatient doctor because your heart rate was already low in the hospital without it. Patient taking differently: Take 20 mg by mouth 2 (two) times daily. Hold this medicine until followup with outpatient doctor because your heart rate was already low in the hospital without it. 10/01/20  Yes Awanda City, MD  tamsulosin  (FLOMAX ) 0.4 MG CAPS capsule Take 1 capsule (0.4 mg total) by mouth  at bedtime. 10/01/20  Yes Awanda City, MD  feeding supplement, ENSURE ENLIVE, (ENSURE ENLIVE) LIQD Take 237 mLs by mouth 3 (three) times daily between meals. 09/15/16   Hower, Alm POUR, MD     Critical care time: 60 minutes       Jenita Jama Meek, AGACNP-BC Acute Care Nurse Practitioner [facility]   [phone] / [phone] Please see Amion for details.

## 2024-08-27 NOTE — Progress Notes (Signed)
 HR dips to 44 at times SB. MD and NP made aware, continue to monitor.

## 2024-08-27 NOTE — Progress Notes (Signed)
 eLink Physician-Brief Progress Note Patient Name: [name] DOB: [birth year] MRN: [id number]   Date of Service  08/27/2024  HPI/Events of Note  Patient admitted with atrial flutter with RVR and chest pain, following Cardizem  he became hypotensive and required phenylephrine  gtt, hence admission to the ICU for close monitoring and Rx.  eICU Interventions  New Patient Evaluation.        Amila Callies U Natalya Domzalski 08/27/2024, 6:43 AM

## 2024-08-27 NOTE — Assessment & Plan Note (Addendum)
 Will start primidone 

## 2024-08-27 NOTE — ED Notes (Signed)
 Message to provider: the additional bolus is complete, it has been about since he received the midodrine  current bp is 71/45 (54) HR 64.

## 2024-08-27 NOTE — ED Notes (Signed)
 Provider response, lets get a manual bp then I will order another 1/2L and midodrine .

## 2024-08-27 NOTE — ED Notes (Addendum)
 Pts blanket changed per request, bed laid back down for comfort.

## 2024-08-27 NOTE — ED Notes (Signed)
 Message to provider, bolus is infusing, he has gotten about , bp is currently 70/43 (52). I did change the bp location and try both arms with very similar results. Pt still has no new complaints and remains a&o. HR is 68. His pressure did go up to 108/81 while he was sitting up eating a snack and when I first started his bolus, but he has since laid down and begun sleeping again his initial bp after laying back down was 67/34 (45) with a HR of 63.

## 2024-08-27 NOTE — ED Notes (Addendum)
 Manual 78/44.

## 2024-08-27 NOTE — Progress Notes (Signed)
 Pt bladder scanned and found to be retaining urine. Straight foley attempted with resistance and pain aborted. Coude catheter ordered and successfully placed by CCU charge nurse.

## 2024-08-27 NOTE — Consult Note (Signed)
 Hematology/Oncology Consult note [facility]  Telephone:([phone] [phone]   Patient Care Team: Glover Lenis, MD as PCP - General (Family Medicine)   Name of the patient: [name]  [id number]  [birth year]   Date of visit: 08/27/2024  HPI-patient is a 63 year old male admitted to the ICU for A-fib with RVR and chest pain requiring vasopressor support after diltiazem  induced hypotension.  Past medical history includes hypertension, seizure disorder (previously on carbamazepine ), essential tremor, chronic anemia, CKD stage IIIb, paroxysmal A-fib (2022), hyperparathyroidism (PTH 179, managed outpatient).  He is on aspirin  for stroke prevention per cardiology. He says that for the past week he has felt increasingly weak and fatigued with exertion.  He noticed swelling of his lower extremities.  When he nearly fell from fatigue at home he called EMS. He says that he has been anemic for many years.  Has previously required blood transfusions.  He says that medications were changed in the past for his seizures due to anemia.  He denies any black or bloody stools.  No bleeding or bruising.  No blood in his urine.  He does not take folate supplement. He lives at home alone. Goes to church weekly. Likes to spend time online on social media.   Review of Systems  Constitutional:  Positive for malaise/fatigue. Negative for chills, fever and weight loss.  HENT:  Negative for hearing loss, nosebleeds, sore throat and tinnitus.   Eyes:  Negative for blurred vision and double vision.  Respiratory:  Negative for cough, hemoptysis, shortness of breath and wheezing.   Cardiovascular:  Negative for chest pain, palpitations and leg swelling.  Gastrointestinal:  Negative for abdominal pain, blood in stool, constipation, diarrhea, melena, nausea and vomiting.  Genitourinary:  Negative for dysuria and urgency.  Musculoskeletal:  Negative for back pain, falls, joint pain  and myalgias.  Skin:  Negative for itching and rash.  Neurological:  Positive for weakness. Negative for dizziness, tingling, sensory change, focal weakness, seizures, loss of consciousness and headaches.  Endo/Heme/Allergies:  Negative for environmental allergies. Does not bruise/bleed easily.  Psychiatric/Behavioral:  Negative for depression. The patient is not nervous/anxious and does not have insomnia.    Allergies  Allergen Reactions   Enoxaparin  Itching    Per pt it makes my eyes steam   Nsaids Other (See Comments)    Kidney disease   Patient Active Problem List   Diagnosis Date Noted   Shock circulatory (HCC) 08/27/2024   Atrial flutter with rapid ventricular response (HCC) 08/26/2024   Acute hypotension 08/26/2024   Acute renal failure superimposed on stage 3b chronic kidney disease (HCC) 08/26/2024   Acute respiratory failure with hypoxia (HCC) 08/26/2024   Hyperparathyroidism (HCC) 08/26/2024   Dyspnea on exertion 08/26/2024   History of colonic polyps    Polyp of transverse colon    CKD (chronic kidney disease) stage 2, GFR 60-89 ml/min 09/23/2020   Acute hyponatremia 09/23/2020   Anemia of chronic renal failure, stage 3b (HCC) 09/23/2020   Seizure disorder (HCC) 09/23/2020   Diarrhea    Generalized weakness    Ataxia 06/19/2020   Iron deficiency anemia, unspecified    Benign neoplasm of ascending colon    Heartburn    Gastritis    Altered mental state    Acute delirium 10/31/2015   Fall    Postural dizziness with presyncope    Laceration of scalp    Near syncope 10/27/2015   Poverty 10/27/2015   Symptomatic anemia    Early  satiety    FTT (failure to thrive) in adult    Protein-calorie malnutrition, severe (HCC) 09/07/2015   Chronic hyponatremia 09/05/2015   Elevated troponin 09/05/2015   Hypokalemia 09/05/2015   Leukopenia 09/05/2015   Renal insufficiency 09/05/2015   Essential tremor 07/15/2015   Essential hypertension 07/15/2015   Chronic renal  insufficiency 07/15/2015   Leg swelling 07/15/2015   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/19/2015   Chronic kidney disease (CKD), stage III (moderate) (HCC) 05/19/2015   Acute on chronic anemia of CKD lllb 05/19/2015   Chest pain due to myocardial ischemia 04/17/2015   Past Medical History:  Diagnosis Date   Anemia    GERD (gastroesophageal reflux disease)    Hypertension    Hyponatremia    chronic   Kidney disease    Migraine    Seizures (HCC)    Past Surgical History:  Procedure Laterality Date   COLONOSCOPY WITH PROPOFOL  N/A 05/01/2016   Procedure: COLONOSCOPY WITH PROPOFOL ;  Surgeon: Rogelia Copping, MD;  Location: [facility] ENDOSCOPY;  Service: Endoscopy;  Laterality: N/A;   COLONOSCOPY WITH PROPOFOL  N/A 06/26/2022   Procedure: COLONOSCOPY WITH PROPOFOL ;  Surgeon: Copping Rogelia, MD;  Location: [facility] ENDOSCOPY;  Service: Endoscopy;  Laterality: N/A;   ESOPHAGOGASTRODUODENOSCOPY (EGD) WITH PROPOFOL  N/A 05/01/2016   Procedure: ESOPHAGOGASTRODUODENOSCOPY (EGD) WITH PROPOFOL ;  Surgeon: Rogelia Copping, MD;  Location: [facility] ENDOSCOPY;  Service: Endoscopy;  Laterality: N/A;   none     Social History   Socioeconomic History   Marital status: Single    Spouse name: Not on file   Number of children: Not on file   Years of education: Not on file   Highest education level: Not on file  Occupational History   Not on file  Tobacco Use   Smoking status: Never   Smokeless tobacco: Never  Vaping Use   Vaping status: Never Used  Substance and Sexual Activity   Alcohol use: No   Drug use: No   Sexual activity: Not on file  Other Topics Concern   Not on file  Social History Narrative   Cross dresses    Social Drivers of Health   Financial Resource Strain: Low Risk  (07/10/2023)   [facility]   Overall Financial Resource Strain (CARDIA)    Difficulty of Paying Living Expenses: Not hard at all  Food Insecurity: No Food Insecurity (07/10/2023)    Received [facility]   Hunger Vital Sign    Within the past 12 months, you worried that your food would run out before you got the money to buy more.: Never true    Within the past 12 months, the food you bought just didn't last and you didn't have money to get more.: Never true  Transportation Needs: No Transportation Needs (07/10/2023)   Received from [facility] - Transportation    In the past 12 months, has lack of transportation kept you from medical appointments or from getting medications?: No    Lack of Transportation (Non-Medical): No  Physical Activity: Not on file  Stress: Not on file  Social Connections: Not on file  Intimate Partner Violence: Not on file   Family History  Problem Relation Age of Onset   Heart attack Father    Kidney disease Father     Current Facility-Administered Medications:    0.9 %  sodium chloride  infusion, 250 mL, Intravenous, Continuous, Rust-Chester, Jenita CROME, NP, Stopped at 08/27/24 [phone]  acetaminophen  (TYLENOL ) tablet 650 mg, 650 mg, Oral, Q6H PRN **OR** acetaminophen  (TYLENOL ) suppository 650 mg, 650 mg, Rectal, Q6H PRN, Cleatus Delayne GAILS, MD   atorvastatin  (LIPITOR ) tablet 80 mg, 80 mg, Oral, Daily, Duncan, Hazel V, MD, 80 mg at 08/27/24 0825   Chlorhexidine  Gluconate Cloth 2 % PADS 6 each, 6 each, Topical, Daily, Aleskerov, Fuad, MD, 6 each at 08/27/24 1211   divalproex  (DEPAKOTE ) DR tablet 750 mg, 750 mg, Oral, Q12H, Cleatus Delayne GAILS, MD, 750 mg at 08/27/24 0827   enoxaparin  (LOVENOX ) injection 30 mg, 30 mg, Subcutaneous, Q24H, Cleatus Delayne GAILS, MD   folic acid  (FOLVITE ) tablet 1 mg, 1 mg, Oral, Daily, Rao, Archana C, MD   levETIRAcetam  (KEPPRA ) tablet 500 mg, 500 mg, Oral, BID, Duncan, Hazel V, MD, 500 mg at 08/27/24 0827   ondansetron  (ZOFRAN ) tablet 4 mg, 4 mg, Oral, Q6H PRN **OR** ondansetron  (ZOFRAN ) injection 4 mg, 4 mg, Intravenous, Q6H PRN, Cleatus Delayne GAILS, MD   Oral care mouth rinse,  15 mL, Mouth Rinse, PRN, Aleskerov, Fuad, MD   pantoprazole  (PROTONIX ) injection 40 mg, 40 mg, Intravenous, Q12H, Rust-Chester, Britton L, NP, 40 mg at 08/27/24 0827   phenylephrine  (NEO-SYNEPHRINE) 20mg /NS 250mL premix infusion, 0-200 mcg/min, Intravenous, Titrated, Rust-Chester, Jenita L, NP, Last Rate: 41.3 mL/hr at 08/27/24 1216, 55 mcg/min at 08/27/24 1216   tamsulosin  (FLOMAX ) capsule 0.4 mg, 0.4 mg, Oral, QHS, Cleatus Delayne V, MD, 0.4 mg at 08/27/24 0017  BP (!) 98/58   Pulse (!) 50   Temp 98.1 F (36.7 C) (Oral)   Resp 14   Ht 5' 3 (1.6 m)   Wt 130 lb 11.7 oz (59.3 kg)   SpO2 100%   BMI 23.16 kg/m       Latest Ref Rng & Units 08/27/2024    5:22 AM  CMP  Glucose 70 - 99 mg/dL 865   BUN 8 - 23 mg/dL 37   Creatinine 9.38 - 1.24 mg/dL 8.21   Sodium 864 - 854 mmol/L 141   Potassium 3.5 - 5.1 mmol/L 2.9   Chloride 98 - 111 mmol/L 108   CO2 22 - 32 mmol/L 25   Calcium  8.9 - 10.3 mg/dL 7.9       Latest Ref Rng & Units 08/27/2024   12:23 PM  CBC  Hemoglobin 13.0 - 17.0 g/dL 7.3   Hematocrit 60.9 - 52.0 % 21.4     CT Angio Chest PE W/Cm &/Or Wo Cm Result Date: 08/26/2024 EXAM: CTA of the Chest with contrast for PE 08/26/2024 09:05:01 PM TECHNIQUE: CTA of the chest was performed after the administration of intravenous contrast. Multiplanar reformatted images are provided for review. MIP images are provided for review. Automated exposure control, iterative reconstruction, and/or weight based adjustment of the mA/kV was utilized to reduce the radiation dose to as low as reasonably achievable. COMPARISON: 09/23/2020 CLINICAL HISTORY: Pulmonary embolism (PE) suspected, high probability. Patient found on the floor by EMS with chest pain, atrial fibrillation with rapid ventricular response, hypotension, and hypoxia. Initial blood pressure was 74/48. FINDINGS: PULMONARY ARTERIES: Pulmonary arteries are adequately opacified for evaluation. No pulmonary embolism. Main pulmonary artery is  normal in caliber. MEDIASTINUM: The heart and pericardium demonstrate no acute abnormality. No pericardial effusion. There is no acute abnormality of the thoracic aorta. Coronary artery and aortic atherosclerotic calcification. LYMPH NODES: No mediastinal, hilar or axillary lymphadenopathy. LUNGS AND PLEURA: The lungs are without acute process. No focal consolidation or pulmonary edema. No pleural effusion or pneumothorax. UPPER ABDOMEN:  Limited images of the upper abdomen are unremarkable. SOFT TISSUES AND BONES: No acute bone or soft tissue abnormality. IMPRESSION: 1. No pulmonary embolism. 2. No acute pulmonary abnormality. Electronically signed by: Norman Gatlin MD 08/26/2024 09:10 PM EDT RP Workstation: [id number]   DG Chest 1 View Result Date: 08/26/2024 CLINICAL DATA:  Shortness of breath. EXAM: CHEST  1 VIEW COMPARISON:  Chest radiograph dated 12/17/2021. FINDINGS: The heart size and mediastinal contours are within normal limits. Both lungs are clear. The visualized skeletal structures are unremarkable. IMPRESSION: No active disease. Electronically Signed   By: Vanetta Chou M.D.   On: 08/26/2024 19:59    Assessment and plan- Patient is a 63 y.o. male currently admitted to hospital for A-fib with RVR and chest pain requiring vasopressor support. Hematology consulted for:   Anemia- Chronic anemia thought to be multi factorial etiology - folate deficiency-ckd-possible AED. Baseline hemoglobin- 10-11. Worse during this hospitalization. Unclear when hemoglobin dropped as last labs prior to this admission were from July 2025 when hemoglobin was 10.7. Since hospitalized, hmg 7-8. 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  Reticulocyte count is low for the degree of anemia.  This could be Secondary to folate deficiency, ckd and may be due to drugs or possible bone marrow cause. He also has a chronic folate deficiency and macrocytosis and has not been taking  folate replacement. Will plan to start him on 1 mg folic acid  daily.  Clinically no evidence of blood loss and his iron stores were normal however, if hemoglobin continues to drop would recommend ruling out bleeding with ct abdomen and consider GI consult. We can also plan for a bone marrow biopsy.  Discussed risks and benefits including pain, bleeding, infection. Patient in agreement. He will be n.p.o. after midnight. Plan for transfusion for hemoglobin < 7.  Thrombocytopenia- normal ldh and low plasmic score not consistent with TTP despite occasional schistocytes noted on peripheral smear. Plan for bone marrow.    Tinnie Dawn, DNP, AGNP-C, [facility] [phone] (clinic) 08/27/2024

## 2024-08-27 NOTE — ED Notes (Signed)
 Pt requesting snack and drink, drink and snack opened and provided for pt.

## 2024-08-27 NOTE — ED Notes (Signed)
 Provider notified of bp 85/50, 500mL bolus of NS ordered. Pt was sleeping during this time, arouses easily to verbal stimuli.

## 2024-08-28 ENCOUNTER — Telehealth (HOSPITAL_COMMUNITY): Payer: Self-pay | Admitting: Pharmacy Technician

## 2024-08-28 ENCOUNTER — Other Ambulatory Visit (HOSPITAL_COMMUNITY): Payer: Self-pay

## 2024-08-28 DIAGNOSIS — I959 Hypotension, unspecified: Secondary | ICD-10-CM | POA: Diagnosis not present

## 2024-08-28 DIAGNOSIS — I4891 Unspecified atrial fibrillation: Secondary | ICD-10-CM | POA: Diagnosis not present

## 2024-08-28 DIAGNOSIS — N179 Acute kidney failure, unspecified: Secondary | ICD-10-CM | POA: Diagnosis not present

## 2024-08-28 DIAGNOSIS — I480 Paroxysmal atrial fibrillation: Secondary | ICD-10-CM | POA: Diagnosis not present

## 2024-08-28 DIAGNOSIS — E785 Hyperlipidemia, unspecified: Secondary | ICD-10-CM | POA: Diagnosis not present

## 2024-08-28 DIAGNOSIS — I10 Essential (primary) hypertension: Secondary | ICD-10-CM | POA: Diagnosis not present

## 2024-08-28 DIAGNOSIS — I2489 Other forms of acute ischemic heart disease: Secondary | ICD-10-CM | POA: Diagnosis not present

## 2024-08-28 DIAGNOSIS — J9601 Acute respiratory failure with hypoxia: Secondary | ICD-10-CM | POA: Diagnosis not present

## 2024-08-28 DIAGNOSIS — N1832 Chronic kidney disease, stage 3b: Secondary | ICD-10-CM | POA: Diagnosis not present

## 2024-08-28 LAB — HEMOGLOBIN AND HEMATOCRIT, BLOOD
HCT: 20.2 % — ABNORMAL LOW (ref 39.0–52.0)
HCT: 20.6 % — ABNORMAL LOW (ref 39.0–52.0)
HCT: 26.8 % — ABNORMAL LOW (ref 39.0–52.0)
Hemoglobin: 6.8 g/dL — ABNORMAL LOW (ref 13.0–17.0)
Hemoglobin: 6.9 g/dL — ABNORMAL LOW (ref 13.0–17.0)
Hemoglobin: 8.9 g/dL — ABNORMAL LOW (ref 13.0–17.0)

## 2024-08-28 LAB — RENAL FUNCTION PANEL
Albumin: 2.4 g/dL — ABNORMAL LOW (ref 3.5–5.0)
Anion gap: 7 (ref 5–15)
BUN: 36 mg/dL — ABNORMAL HIGH (ref 8–23)
CO2: 24 mmol/L (ref 22–32)
Calcium: 8.3 mg/dL — ABNORMAL LOW (ref 8.9–10.3)
Chloride: 109 mmol/L (ref 98–111)
Creatinine, Ser: 1.8 mg/dL — ABNORMAL HIGH (ref 0.61–1.24)
GFR, Estimated: 41 mL/min — ABNORMAL LOW (ref >60)
Glucose, Bld: 105 mg/dL — ABNORMAL HIGH (ref 70–99)
Phosphorus: 3 mg/dL (ref 2.5–4.6)
Potassium: 3.4 mmol/L — ABNORMAL LOW (ref 3.5–5.1)
Sodium: 140 mmol/L (ref 135–145)

## 2024-08-28 LAB — KAPPA/LAMBDA LIGHT CHAINS
Kappa free light chain: 26.5 mg/L — ABNORMAL HIGH (ref 3.3–19.4)
Kappa, lambda light chain ratio: 0.93 (ref 0.26–1.65)
Lambda free light chains: 28.5 mg/L — ABNORMAL HIGH (ref 5.7–26.3)

## 2024-08-28 LAB — CBC
HCT: 20.2 % — ABNORMAL LOW (ref 39.0–52.0)
Hemoglobin: 7 g/dL — ABNORMAL LOW (ref 13.0–17.0)
MCH: 34.5 pg — ABNORMAL HIGH (ref 26.0–34.0)
MCHC: 34.7 g/dL (ref 30.0–36.0)
MCV: 99.5 fL (ref 80.0–100.0)
Platelets: 77 K/uL — ABNORMAL LOW (ref 150–400)
RBC: 2.03 MIL/uL — ABNORMAL LOW (ref 4.22–5.81)
RDW: 16.7 % — ABNORMAL HIGH (ref 11.5–15.5)
WBC: 7.5 K/uL (ref 4.0–10.5)
nRBC: 0 % (ref 0.0–0.2)

## 2024-08-28 LAB — MAGNESIUM: Magnesium: 1.6 mg/dL — ABNORMAL LOW (ref 1.7–2.4)

## 2024-08-28 LAB — BLOOD GAS, VENOUS

## 2024-08-28 LAB — GLUCOSE 6 PHOSPHATE DEHYDROGENASE
G6PDH: 10.3 U/g{Hb} (ref 4.8–15.7)
Hemoglobin: 7.2 g/dL — ABNORMAL LOW (ref 13.0–17.7)

## 2024-08-28 LAB — HEMOGLOBIN FREE, PLASMA: Hgb, Plasma: 2.4 mg/dL (ref 0.0–4.9)

## 2024-08-28 LAB — PREPARE RBC (CROSSMATCH)

## 2024-08-28 LAB — HAPTOGLOBIN: Haptoglobin: 120 mg/dL (ref 32–363)

## 2024-08-28 MED ORDER — MAGNESIUM SULFATE 4 GM/100ML IV SOLN
4.0000 g | Freq: Once | INTRAVENOUS | Status: AC
  Administered 2024-08-28: 4 g via INTRAVENOUS
  Filled 2024-08-28: qty 100

## 2024-08-28 MED ORDER — HEPARIN SODIUM (PORCINE) 5000 UNIT/ML IJ SOLN
5000.0000 [IU] | Freq: Three times a day (TID) | INTRAMUSCULAR | Status: DC
  Administered 2024-08-29 – 2024-09-05 (×21): 5000 [IU] via SUBCUTANEOUS
  Filled 2024-08-28 (×21): qty 1

## 2024-08-28 MED ORDER — SODIUM CHLORIDE 0.9% IV SOLUTION: Freq: Once | INTRAVENOUS | Status: AC

## 2024-08-28 MED ORDER — POTASSIUM CHLORIDE 10 MEQ/100ML IV SOLN
10.0000 meq | INTRAVENOUS | Status: AC
  Administered 2024-08-28 (×2): 10 meq via INTRAVENOUS
  Filled 2024-08-28 (×2): qty 100

## 2024-08-28 MED ORDER — MIDODRINE HCL 5 MG PO TABS
10.0000 mg | ORAL_TABLET | Freq: Three times a day (TID) | ORAL | Status: DC
  Administered 2024-08-28 – 2024-09-05 (×24): 10 mg via ORAL
  Filled 2024-08-28 (×23): qty 2

## 2024-08-28 NOTE — Plan of Care (Signed)
  Problem: Clinical Measurements: Goal: Will remain free from infection Outcome: Progressing Goal: Respiratory complications will improve Outcome: Progressing   Problem: Nutrition: Goal: Adequate nutrition will be maintained Outcome: Progressing   Problem: Coping: Goal: Level of anxiety will decrease Outcome: Progressing   Problem: Pain Managment: Goal: General experience of comfort will improve and/or be controlled Outcome: Progressing   Problem: Safety: Goal: Ability to remain free from injury will improve Outcome: Progressing

## 2024-08-28 NOTE — Progress Notes (Signed)
 [facility] CARDIOLOGY PROGRESS NOTE       Patient ID: [name] MRN: [id number] DOB/AGE: [birth year] 63 y.o.  Admit date: 08/26/2024 Referring Physician Dr. Parris Primary Physician Glover Lenis, MD Primary Cardiologist Dr. Ammon Reason for Consultation AFL RVR  HPI: [name] is a 63 y.o. male  with a past medical history of paroxysmal atrial fibrillation, hypertension, hyperlipidemia, CKD who presented to the ED on 08/26/2024 for intermittent palpitations a/w SOB for the past few days.  On arrival to ED patient found to be in atrial fibrillation RVR  rate 140s. IV Cardizem  initiated, led to hypotension requiring pressors. Cardiology was consulted for AF RVR.  Interval History: -Patient seen and examined this AM and laying comfortably in hospital bed. Patient states he feels good this AM and continues to deny chest pain/pressure, SOB, lightheadedness/dizziness.  -Patients BP soft and HR  stable this AM. Per tele remains SR, rate 60s -Patient remains on room air with stable SpO2.  -Hgb 6.9 - PRBCs this AM -K 3.4, Mg 1.6 - replacing electrolytes this AM.   Pertinent Cardiac History (Most recent) Stress test (09/29/2021) Regional wall motion:  reveals normal myocardial thickening and wall  motion.  The overall quality of the study is good.   Artifacts noted: no  Left ventricular cavity: normal.   Review of systems complete and found to be negative unless listed above    Past Medical History:  Diagnosis Date   Anemia    GERD (gastroesophageal reflux disease)    Hypertension    Hyponatremia    chronic   Kidney disease    Migraine    Seizures (HCC)     Past Surgical History:  Procedure Laterality Date   COLONOSCOPY WITH PROPOFOL  N/A 05/01/2016   Procedure: COLONOSCOPY WITH PROPOFOL ;  Surgeon: Rogelia Copping, MD;  Location: [facility] ENDOSCOPY;  Service: Endoscopy;  Laterality: N/A;   COLONOSCOPY WITH PROPOFOL  N/A 06/26/2022   Procedure: COLONOSCOPY WITH  PROPOFOL ;  Surgeon: Copping Rogelia, MD;  Location: [facility] ENDOSCOPY;  Service: Endoscopy;  Laterality: N/A;   ESOPHAGOGASTRODUODENOSCOPY (EGD) WITH PROPOFOL  N/A 05/01/2016   Procedure: ESOPHAGOGASTRODUODENOSCOPY (EGD) WITH PROPOFOL ;  Surgeon: Rogelia Copping, MD;  Location: [facility] ENDOSCOPY;  Service: Endoscopy;  Laterality: N/A;   none      Medications Prior to Admission  Medication Sig Dispense Refill Last Dose/Taking   alendronate  (FOSAMAX ) 70 MG tablet Take 70 mg by mouth once a week. Take with a full glass of water on an empty stomach.   08/21/2024   atorvastatin  (LIPITOR ) 80 MG tablet Take 80 mg by mouth at bedtime.   08/25/2024   divalproex  (DEPAKOTE ) 250 MG DR tablet Take 3 tablets (750 mg total) by mouth every 12 (twelve) hours. (Patient taking differently: Take 750 mg by mouth 2 (two) times daily.) 100 tablet 1 08/26/2024 Morning   levETIRAcetam  (KEPPRA ) 500 MG tablet Take 1 tablet (500 mg total) by mouth 2 (two) times daily. 60 tablet 2 08/26/2024 Morning   lisinopril  (ZESTRIL ) 5 MG tablet Hold this medicine until followup with outpatient doctor because your blood pressure was normal in the hospital without it.   08/26/2024 Morning   propranolol  (INDERAL ) 20 MG tablet Hold this medicine until followup with outpatient doctor because your heart rate was already low in the hospital without it. (Patient taking differently: Take 20 mg by mouth 2 (two) times daily. Hold this medicine until followup with outpatient doctor because your heart rate was already low in the hospital without it.)   08/26/2024 Morning  tamsulosin  (FLOMAX ) 0.4 MG CAPS capsule Take 1 capsule (0.4 mg total) by mouth at bedtime.   08/25/2024   feeding supplement, ENSURE ENLIVE, (ENSURE ENLIVE) LIQD Take 237 mLs by mouth 3 (three) times daily between meals. 237 mL 12    Social History   Socioeconomic History   Marital status: Single    Spouse name: Not on file   Number of children: Not on file   Years of education: Not on file   Highest  education level: Not on file  Occupational History   Not on file  Tobacco Use   Smoking status: Never   Smokeless tobacco: Never  Vaping Use   Vaping status: Never Used  Substance and Sexual Activity   Alcohol use: No   Drug use: No   Sexual activity: Not on file  Other Topics Concern   Not on file  Social History Narrative   Cross dresses    Social Drivers of Health   Financial Resource Strain: Low Risk  (07/10/2023)   Received from [facility]   Overall Financial Resource Strain (CARDIA)    Difficulty of Paying Living Expenses: Not hard at all  Food Insecurity: No Food Insecurity (07/10/2023)   Received from [facility]   Hunger Vital Sign    Within the past 12 months, you worried that your food would run out before you got the money to buy more.: Never true    Within the past 12 months, the food you bought just didn't last and you didn't have money to get more.: Never true  Transportation Needs: No Transportation Needs (07/10/2023)   Received from [facility] - Transportation    In the past 12 months, has lack of transportation kept you from medical appointments or from getting medications?: No    Lack of Transportation (Non-Medical): No  Physical Activity: Not on file  Stress: Not on file  Social Connections: Not on file  Intimate Partner Violence: Not on file    Family History  Problem Relation Age of Onset   Heart attack Father    Kidney disease Father      Vitals:   08/28/24 0700 08/28/24 0708 08/28/24 0710 08/28/24 0800  BP:  (!) 86/54  (!) 91/58  Pulse: (!) 51 (!) 57 (!) 57 (!) 57  Resp: 17 12 12 13   Temp:  98.3 F (36.8 C)    TempSrc:  Oral    SpO2: 98% 100% 100% 98%  Weight:      Height:        PHYSICAL EXAM General: Chronically ill appearing elderly male, well nourished, in no acute distress. HEENT: Normocephalic and atraumatic. Neck: No JVD.   Lungs: Normal respiratory effort on room  air. Clear bilaterally to auscultation. No wheezes, crackles, rhonchi.  Heart: HRRR. Normal S1 and S2 without gallops or murmurs.  Abdomen: Non-distended appearing.  Msk: Normal strength and tone for age. Extremities: Warm and well perfused. No clubbing, cyanosis, edema.  Neuro: Alert and oriented X 3. Psych: Answers questions appropriately.   Labs: Basic Metabolic Panel: Recent Labs    08/27/24 0522 08/27/24 0547 08/27/24 0547 08/27/24 2046 08/28/24 0502  NA 141  --   --   --  140  K 2.9*  --   --   --  3.4*  CL 108  --   --   --  109  CO2 25  --   --   --  24  GLUCOSE 134*  --   --   --  105*  BUN 37*  --   --   --  36*  CREATININE 1.78*  --   --   --  1.80*  CALCIUM  7.9*  --   --   --  8.3*  MG  --  1.7  --   --  1.6*  PHOS  --  1.5*   < > 4.5 3.0   < > = values in this interval not displayed.   Liver Function Tests: Recent Labs    08/26/24 1926 08/28/24 0502  AST 58*  --   ALT 17  --   ALKPHOS 32*  --   BILITOT 0.8  --   PROT 5.8*  --   ALBUMIN 3.1* 2.4*   No results for input(s): LIPASE, AMYLASE in the last 72 hours. CBC: Recent Labs    08/26/24 1926 08/26/24 2300 08/27/24 1005 08/27/24 1223 08/28/24 0502 08/28/24 0503  WBC 6.1   < > 5.2  --  7.5  --   NEUTROABS 4.3  --  2.7  --   --   --   HGB 8.5*   < > 7.7*   < > 7.0* 6.9*  HCT 25.5*   < > 22.5*   < > 20.2* 20.6*  MCV 102.8*   < > 100.0  --  99.5  --   PLT 114*   < > 86*  --  77*  --    < > = values in this interval not displayed.   Cardiac Enzymes: Recent Labs    08/26/24 1926 08/26/24 2130 08/27/24 0547  TROPONINIHS 24* 23* 25*   BNP: Recent Labs    08/26/24 2013  BNP 341.0*   D-Dimer: No results for input(s): DDIMER in the last 72 hours. Hemoglobin A1C: No results for input(s): HGBA1C in the last 72 hours. Fasting Lipid Panel: No results for input(s): CHOL, HDL, LDLCALC, TRIG, CHOLHDL, LDLDIRECT in the last 72 hours. Thyroid  Function Tests: Recent Labs     08/27/24 1223  TSH 1.428   Anemia Panel: Recent Labs    08/26/24 2130 08/26/24 2300 08/27/24 1005  VITAMINB12  --  3,077*  --   FOLATE 4.7*  --   --   FERRITIN 273  --   --   TIBC 274  --   --   IRON 150  --   --   RETICCTPCT  --   --  0.5     Radiology: ECHOCARDIOGRAM COMPLETE Result Date: 08/27/2024    ECHOCARDIOGRAM REPORT   Patient Name:   [name] Date of Exam: 08/27/2024 Medical Rec #:  [id number]        Height:       63.0 in Accession #:    [id number]       Weight:       130.7 lb Date of Birth:  1961-10-05        BSA:          1.614 m Patient Age:    63 years         BP:           88/63 mmHg Patient Gender: M                HR:           49 bpm. Exam Location:  [facility] Procedure: 2D Echo, Cardiac Doppler, Color Doppler and Intracardiac  Opacification Agent (Both Spectral and Color Flow Doppler were            utilized during procedure). Indications:     Atrial Flutter I48.92  History:         Patient has prior history of Echocardiogram examinations, most                  recent 09/17/2020. Arrythmias:Atrial Flutter.  Sonographer:     Ashley McNeely-Sloane Referring Phys:  [id number] DELAYNE LULLA SOLIAN Diagnosing Phys: Marsa Dooms MD IMPRESSIONS  1. Left ventricular ejection fraction, by estimation, is 60 to 65%. The left ventricle has normal function. The left ventricle has no regional wall motion abnormalities. Left ventricular diastolic parameters are indeterminate.  2. Right ventricular systolic function is normal. The right ventricular size is normal.  3. The mitral valve is normal in structure. Trivial mitral valve regurgitation. No evidence of mitral stenosis.  4. The aortic valve is normal in structure. Aortic valve regurgitation is not visualized. No aortic stenosis is present.  5. The inferior vena cava is normal in size with greater than 50% respiratory variability, suggesting right atrial pressure of 3 mmHg. FINDINGS  Left Ventricle: Left ventricular ejection  fraction, by estimation, is 60 to 65%. The left ventricle has normal function. The left ventricle has no regional wall motion abnormalities. Definity  contrast agent was given IV to delineate the left ventricular  endocardial borders. Strain was performed and the global longitudinal strain is indeterminate. The left ventricular internal cavity size was normal in size. There is no left ventricular hypertrophy. Left ventricular diastolic parameters are indeterminate. Right Ventricle: The right ventricular size is normal. No increase in right ventricular wall thickness. Right ventricular systolic function is normal. Left Atrium: Left atrial size was normal in size. Right Atrium: Right atrial size was normal in size. Pericardium: There is no evidence of pericardial effusion. Mitral Valve: The mitral valve is normal in structure. Trivial mitral valve regurgitation. No evidence of mitral valve stenosis. MV peak gradient, 6.2 mmHg. The mean mitral valve gradient is 1.0 mmHg. Tricuspid Valve: The tricuspid valve is normal in structure. Tricuspid valve regurgitation is trivial. No evidence of tricuspid stenosis. Aortic Valve: The aortic valve is normal in structure. Aortic valve regurgitation is not visualized. No aortic stenosis is present. Aortic valve mean gradient measures 2.0 mmHg. Aortic valve peak gradient measures 4.8 mmHg. Aortic valve area, by VTI measures 2.39 cm. Pulmonic Valve: The pulmonic valve was normal in structure. Pulmonic valve regurgitation is not visualized. No evidence of pulmonic stenosis. Aorta: The aortic root is normal in size and structure. Venous: The inferior vena cava is normal in size with greater than 50% respiratory variability, suggesting right atrial pressure of 3 mmHg. IAS/Shunts: No atrial level shunt detected by color flow Doppler. Additional Comments: 3D was performed not requiring image post processing on an independent workstation and was indeterminate.  LEFT VENTRICLE PLAX 2D  LVIDd:         3.60 cm     Diastology LVIDs:         2.20 cm     LV e' medial:    9.99 cm/s LV PW:         0.90 cm     LV E/e' medial:  11.2 LV IVS:        0.80 cm     LV e' lateral:   14.60 cm/s LVOT diam:     1.90 cm     LV E/e' lateral: 7.7 LV SV:  65 LV SV Index:   40 LVOT Area:     2.84 cm  LV Volumes (MOD) LV vol d, MOD A2C: 61.6 ml LV vol d, MOD A4C: 60.7 ml LV vol s, MOD A2C: 25.7 ml LV vol s, MOD A4C: 26.1 ml LV SV MOD A2C:     35.9 ml LV SV MOD A4C:     60.7 ml LV SV MOD BP:      34.9 ml RIGHT VENTRICLE            IVC RV Basal diam:  3.50 cm    IVC diam: 2.30 cm RV Mid diam:    2.60 cm RV S prime:     9.46 cm/s TAPSE (M-mode): 2.3 cm LEFT ATRIUM             Index        RIGHT ATRIUM           Index LA diam:        3.20 cm 1.98 cm/m   RA Area:     12.50 cm LA Vol (A2C):   27.4 ml 16.98 ml/m  RA Volume:   25.10 ml  15.55 ml/m LA Vol (A4C):   43.6 ml 27.01 ml/m LA Biplane Vol: 34.2 ml 21.19 ml/m  AORTIC VALVE                    PULMONIC VALVE AV Area (Vmax):    2.63 cm     PV Vmax:        0.70 m/s AV Area (Vmean):   2.50 cm     PV Vmean:       46.800 cm/s AV Area (VTI):     2.39 cm     PV VTI:         0.156 m AV Vmax:           109.00 cm/s  PV Peak grad:   1.9 mmHg AV Vmean:          71.400 cm/s  PV Mean grad:   1.0 mmHg AV VTI:            0.273 m      RVOT Peak grad: 1 mmHg AV Peak Grad:      4.8 mmHg AV Mean Grad:      2.0 mmHg LVOT Vmax:         101.00 cm/s LVOT Vmean:        62.900 cm/s LVOT VTI:          0.230 m LVOT/AV VTI ratio: 0.84  AORTA Ao Root diam: 3.40 cm MITRAL VALVE                TRICUSPID VALVE MV Area (PHT): 4.80 cm     TR Peak grad:   27.2 mmHg MV Area VTI:   1.97 cm     TR Vmax:        261.00 cm/s MV Peak grad:  6.2 mmHg MV Mean grad:  1.0 mmHg     SHUNTS MV Vmax:       1.25 m/s     Systemic VTI:  0.23 m MV Vmean:      45.9 cm/s    Systemic Diam: 1.90 cm MV Decel Time: 158 msec     Pulmonic VTI:  0.122 m MV E velocity: 112.00 cm/s MV A velocity: 44.50 cm/s MV E/A ratio:   2.52 Marsa Dooms MD Electronically signed by Marsa Dooms MD Signature Date/Time: 08/27/2024/1:26:42 PM  Final    CT Angio Chest PE W/Cm &/Or Wo Cm Result Date: 08/26/2024 EXAM: CTA of the Chest with contrast for PE 08/26/2024 09:05:01 PM TECHNIQUE: CTA of the chest was performed after the administration of intravenous contrast. Multiplanar reformatted images are provided for review. MIP images are provided for review. Automated exposure control, iterative reconstruction, and/or weight based adjustment of the mA/kV was utilized to reduce the radiation dose to as low as reasonably achievable. COMPARISON: 09/23/2020 CLINICAL HISTORY: Pulmonary embolism (PE) suspected, high probability. Patient found on the floor by EMS with chest pain, atrial fibrillation with rapid ventricular response, hypotension, and hypoxia. Initial blood pressure was 74/48. FINDINGS: PULMONARY ARTERIES: Pulmonary arteries are adequately opacified for evaluation. No pulmonary embolism. Main pulmonary artery is normal in caliber. MEDIASTINUM: The heart and pericardium demonstrate no acute abnormality. No pericardial effusion. There is no acute abnormality of the thoracic aorta. Coronary artery and aortic atherosclerotic calcification. LYMPH NODES: No mediastinal, hilar or axillary lymphadenopathy. LUNGS AND PLEURA: The lungs are without acute process. No focal consolidation or pulmonary edema. No pleural effusion or pneumothorax. UPPER ABDOMEN: Limited images of the upper abdomen are unremarkable. SOFT TISSUES AND BONES: No acute bone or soft tissue abnormality. IMPRESSION: 1. No pulmonary embolism. 2. No acute pulmonary abnormality. Electronically signed by: Norman Gatlin MD 08/26/2024 09:10 PM EDT RP Workstation: [id number]   DG Chest 1 View Result Date: 08/26/2024 CLINICAL DATA:  Shortness of breath. EXAM: CHEST  1 VIEW COMPARISON:  Chest radiograph dated 12/17/2021. FINDINGS: The heart size and mediastinal contours are  within normal limits. Both lungs are clear. The visualized skeletal structures are unremarkable. IMPRESSION: No active disease. Electronically Signed   By: Vanetta Chou M.D.   On: 08/26/2024 19:59    ECHO as above..  TELEMETRY reviewed by me 08/28/2024: sinus rhythm, rate 60s  EKG reviewed by me: (09/10) with atrial fibrillation, rates 97 bpm.   Repeat EKG on 09/11 with sinus rhythm, rate 57 bpm.   Data reviewed by me 08/28/2024: last 24h vitals tele labs imaging I/O PCCM notes oncology  Principal Problem:   Atrial flutter with rapid ventricular response (HCC) Active Problems:   Chest pain due to myocardial ischemia   Essential tremor   Essential hypertension   Elevated troponin   Postural dizziness with presyncope   Acute on chronic anemia of CKD lllb   Anemia of chronic renal failure, stage 3b (HCC)   Seizure disorder (HCC)   Acute hypotension   Acute renal failure superimposed on stage 3b chronic kidney disease (HCC)   Acute respiratory failure with hypoxia (HCC)   Hyperparathyroidism (HCC)   Dyspnea on exertion   Shock circulatory (HCC)    ASSESSMENT AND PLAN:  [name] is a 63 y.o. male  with a past medical history of paroxysmal atrial fibrillation, hypertension, hyperlipidemia, CKD who presented to the ED on 08/26/2024 for intermittent palpitations a/w SOB for the past few days.  On arrival to ED patient found to be in atrial fibrillation RVR  rate 140s. IV Cardizem  initiated, led to hypotension requiring pressors. Cardiology was consulted for AF RVR.  # Severe Anemia/thrombocytopenia # Atrial fibrillation RVR # Paroxysmal atrial fibrillation # Hypokalemia -Monitor and replenish electrolytes for a goal K >4, Mag >2  -Hold off on rate control medication (BB, CCB) due to baseline bradycardia.  -Previously defer chronic anticoagulation with CHA2DS2VASc of 1. With age 58 yo and hx HTN CHA2DS2VASc score is at least 2, recommend PO Eliquis 2.5 mg BID for stroke  risk  reduction. Patient is agreeable. Will hold off on starting at this time with severe anemia requiring blood transfusion. -Plan to place cardiac monitor at discharge to further evaluation intermittent palpitations a/w SOB, likely in/out of AF. (This has improved since admission), severe anemia requiring blood transfer likely contributing.  -Scheduled outpatient EP for further evaluation, possibly discuss candidacy for ablation as stated below.  -Oncology consulted. Plan for bone marrow biopsy on Monday.  # Circulatory shock # Hypertension # Hyperlipidemia # Demand ischemia Patient without chest pain. Troponins minimally elevated and flat 24 > 23 > 25.  EKG without acute ischemic changes.  This admission with preserved EF, no RWMA, no significant valvular dysfunction. - Continue atorvastatin  80 mg daily. - Home antihypertensives (lisinopril  5 mg) held due to soft BP requiring pressors. - Continue IV phenylephrine  for soft BP.  Wean when able.  Recommend MAP > 65. Management per PCCM.  - Minimally elevated and flat troponins is most consistent with demand/supply mismatch and not ACS. No further cardiac diagnostics at this time.  Follow-up scheduled with Dr. Ammon on 09/25 at 10:30 AM  and with EP on 09/30 at 11 AM  This patient's plan of care was discussed and created with Dr. Ammon and he is in agreement.  Signed: Dorene Comfort, PA-C  08/28/2024, 9:07 AM [facility]
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
## 2024-08-28 NOTE — Evaluation (Signed)
 Occupational Therapy Evaluation Patient Details Name: [name] MRN: [id number] DOB: 03-Feb-1961 Today's Date: 08/28/2024   History of Present Illness   [name] is a 63 y.o. male  with a past medical history of paroxysmal atrial fibrillation, seizures, hypertension, hyperlipidemia, CKD who presented to the ED on 08/26/2024 for intermittent palpitations a/w SOB for the past few days.  On arrival to ED patient found to be in atrial fibrillation RVR  rate 140s. IV Cardizem  initiated, led to hypotension requiring pressors   Clinical Impressions [name] was seen for OT evaluation this date. Prior to hospital admission, pt was IND. Pt lives alone. Pt currently requires MOD A for BSC t/f and pericare standing. MAX A self-feeding at bed level - assist 2/2 tremors. Further activity limited by pt request. BP 110/70 on return to bed, unable to read accurately in sitting 2/2 tremors. Pt would benefit from skilled OT to address noted impairments and functional limitations (see below for any additional details). Upon hospital discharge, recommend OT follow up.     If plan is discharge home, recommend the following:   A lot of help with walking and/or transfers;A lot of help with bathing/dressing/bathroom     Functional Status Assessment   Patient has had a recent decline in their functional status and demonstrates the ability to make significant improvements in function in a reasonable and predictable amount of time.     Equipment Recommendations   BSC/3in1     Recommendations for Other Services         Precautions/Restrictions   Precautions Precautions: Fall Recall of Precautions/Restrictions: Impaired Restrictions Weight Bearing Restrictions Per Provider Order: No     Mobility Bed Mobility Overal bed mobility: Needs Assistance Bed Mobility: Supine to Sit, Sit to Supine     Supine to sit: Min assist Sit to supine: Min assist        Transfers Overall  transfer level: Needs assistance Equipment used: 1 person hand held assist Transfers: Sit to/from Stand, Bed to chair/wheelchair/BSC Sit to Stand: Min assist     Step pivot transfers: Mod assist            Balance Overall balance assessment: Needs assistance Sitting-balance support: No upper extremity supported, Feet supported Sitting balance-Leahy Scale: Fair     Standing balance support: Bilateral upper extremity supported Standing balance-Leahy Scale: Poor                             ADL either performed or assessed with clinical judgement   ADL Overall ADL's : Needs assistance/impaired                                       General ADL Comments: MOD A for BSC t/f and pericare standing. MAX A self-feeding at bed level - assist 2/2 tremors      Pertinent Vitals/Pain Pain Assessment Pain Assessment: No/denies pain     Extremity/Trunk Assessment Upper Extremity Assessment Upper Extremity Assessment: RUE deficits/detail;LUE deficits/detail RUE Deficits / Details: intention tremors - reports new with medication changes LUE Deficits / Details: intention tremors - reports new with medication changes   Lower Extremity Assessment Lower Extremity Assessment: Generalized weakness       Communication Communication Communication: No apparent difficulties   Cognition Arousal: Alert Behavior During Therapy: WFL for tasks assessed/performed Cognition: No family/caregiver present to determine baseline  OT - Cognition Comments: difficulty attending to questions                 Following commands: Impaired Following commands impaired: Follows one step commands with increased time     Cueing  General Comments      BP 100/70 after activity   Exercises     Shoulder Instructions      Home Living Family/patient expects to be discharged to:: Private residence Living Arrangements: Alone   Type of Home: House Home  Access: Stairs to enter Secretary/administrator of Steps: 3 Entrance Stairs-Rails: Right;Left Home Layout: One level                          Prior Functioning/Environment Prior Level of Function : Independent/Modified Independent                    OT Problem List: Decreased strength;Decreased range of motion;Decreased activity tolerance;Impaired balance (sitting and/or standing);Decreased safety awareness   OT Treatment/Interventions: Self-care/ADL training;Therapeutic exercise;Energy conservation;DME and/or AE instruction;Therapeutic activities;Balance training;Patient/family education      OT Goals(Current goals can be found in the care plan section)   Acute Rehab OT Goals Patient Stated Goal: to go home OT Goal Formulation: With patient Time For Goal Achievement: 09/11/24 Potential to Achieve Goals: Good ADL Goals Pt Will Perform Grooming: with modified independence;standing Pt Will Perform Lower Body Dressing: with modified independence;sit to/from stand Pt Will Transfer to Toilet: with modified independence;ambulating;regular height toilet   OT Frequency:  Min 2X/week    Co-evaluation              AM-PAC OT 6 Clicks Daily Activity     Outcome Measure Help from another person eating meals?: None Help from another person taking care of personal grooming?: A Little Help from another person toileting, which includes using toliet, bedpan, or urinal?: A Lot Help from another person bathing (including washing, rinsing, drying)?: A Lot Help from another person to put on and taking off regular upper body clothing?: A Little Help from another person to put on and taking off regular lower body clothing?: A Lot 6 Click Score: 16   End of Session Nurse Communication: Mobility status  Activity Tolerance: Patient tolerated treatment well Patient left: in bed;with call bell/phone within reach  OT Visit Diagnosis: Other abnormalities of gait and mobility  (R26.89);Muscle weakness (generalized) (M62.81)                Time: 1130-1155 OT Time Calculation (min): 25 min Charges:  OT General Charges $OT Visit: 1 Visit OT Evaluation $OT Eval Moderate Complexity: 1 Mod OT Treatments $Self Care/Home Management : 8-22 mins  [name], M.S. OTR/L  08/28/24, 1:27 PM  ascom [phone]

## 2024-08-28 NOTE — Plan of Care (Signed)
 Continuing with plan of care.

## 2024-08-28 NOTE — Telephone Encounter (Signed)
 Patient Product/process development scientist completed.    The patient is insured through [employer]. Patient has Medicare and is not eligible for a copay card, but may be able to apply for patient assistance or Medicare RX Payment Plan (Patient Must reach out to their plan, if eligible for payment plan), if available.    Ran test claim for Eliquis 2.5 mg and the current 30 day co-pay is $0.00.   This test claim was processed through [facility]- copay amounts may vary at other pharmacies due to pharmacy/plan contracts, or as the patient moves through the different stages of their insurance plan.     Morgan Arab, CPHT Pharmacy Technician III Certified Patient Advocate [facility] Patient Advocate Team Direct Number: [phone]  Fax: [phone]

## 2024-08-28 NOTE — Progress Notes (Signed)
 NAME:  [name], MRN:  [id number], DOB:  [birth year], LOS: 1 ADMISSION DATE:  08/26/2024, CONSULTATION DATE:  08/27/24 REFERRING MD:  Dr. Cleatus, CHIEF COMPLAINT: Chest pain    History of Present Illness:  63 yo M presenting to [facility] ED from home via EMS for evaluation of chest pain.  History provided per chart review and patient bedside report. Patient was in his normal state of health until 08/23/24 when he felt something hard squeezing my chest as he was walking back to the house while taking out the trash. He was not sure if this was his A-fib or a sign of a heart attack and self-administered propanolol which he takes PRN for palpitations and lay down. His chest pain improved however over the next few days he experienced generalized fatigue with dyspnea on exertion as well as increased lower extremity edema. He called EMS was he was unable to to bear his own weight. He denies fever/ chills, abdominal pain/ nausea/ vomiting/ diarrhea, urinary symptoms, new cough/ congestion, signs of bleeding, falls or LOC.  08/28/24- patients procedure postponed till Monday, we cancelled NPO order and re-initiated diet today.  He is on glucagon  gtt for betablocker induced circulatory shock with bradyarrythmia. He did develop worsening anemia and is s/p prbc transfusion.  Clinically imporved and now lucid awake alert. Still on neosynephrine but off levophed completely immediately after glucagon  therapy.   Pertinent  Medical History  HTN Seizure disorder Essential Tremor Chronic Anemia CKD 3b PAF in 2022 on ASA Hyponatremia  Significant Hospital Events: Including procedures, antibiotic start and stop dates in addition to other pertinent events   08/26/24: Admit to Inpatient with A-fib RVR treated with Diltiazem . Overnight patient developed refractory hypotension suspect medication induced +/- cardiogenic etiology requiring vasopressor support. PCCM consulted and patient transferred to ICU  Interim  History / Subjective:  Patient alert and responsive with stable vitals on RA and 75 mg of phenylephrine . Plan of care discussed, all questions and concerns answered at this time.  Objective    Blood pressure (!) 91/58, pulse 65, temperature 98.3 F (36.8 C), temperature source Oral, resp. rate 18, height 5' 3 (1.6 m), weight 59.3 kg, SpO2 100%.        Intake/Output Summary (Last 24 hours) at 08/28/2024 1004 Last data filed at 08/28/2024 0700 Gross per 24 hour  Intake 1535.14 ml  Output 1900 ml  Net -364.86 ml   Filed Weights   08/26/24 1927 08/27/24 0625  Weight: 54.4 kg 59.3 kg    Examination: General: Adult male, acutely ill, lying in bed, NAD HEENT: MM pink/moist, anicteric, atraumatic, neck supple Neuro: A&O x 4, able to follow commands, PERRL +3, MAE, essential tremor present in BUE CV: s1s2 RRR, a-fib bradycardia on monitor, no r/m/g Pulm: Regular, non labored on RA, breath sounds clear-BUL & diminished-BLL GI: soft, rounded, non tender, bs x 4 Skin: no rashes/lesions noted Extremities: warm/dry, pulses + 2 R/P, no edema noted  Resolved problem list   Assessment and Plan  Circulatory Shock -       With bradyarrythmia - due to propranolol  overdose and anemia     Patient takes BID propranolol   20mg  and also propranolol  20mg  PRN for AF which he states he took several extra pill daily due to feeling AF in his chest.            This has largely impproved post glucagon  therapy and he is now on neosyneprhine only with weaning.  He also has overlying reduced  hb in parallel with other cell lines possibley due to bone marrow suppression with anti epileptic therapy.     He has not had any sings of bleeding .  He does not have any clinical signs of sepsis or microbiology to suggest active infection.   Chronic / Paroxysmal Atrial Fibrillation with Rapid Ventricular Response- improved Chest Pain Mildly Elevated Troponin suspect secondary to demand ischemia Pre-syncope PMHx:  HTN CHA2DS2-VASc score: 2 - consider Amiodarone vs low dose BB for rate control as diltiazem  appears to cause hypotension - hold off on systemic anticoagulation due to acute anemia - infectious work up to rule out sepsis: lactic, UA. CXR negative, no fever or leukocytosis, low suspicion will hold off on antibiotics at this time. - start Phenylephrine , wean as tolerated to maintain > 65 and SBP > 90 - f/u Echocardiogram - hold propanolol due to hypotension - Cardiology consulted, appreciate input - Continuous cardiac monitoring  Acute Kidney Injury superimposed on CKD stage 3b  Baseline Cr: 1.6- 1.7, Cr on admission:2.02 - Strict I/O's: alert provider if UOP < 0.5 mL/kg/hr - gentle IVF hydration  - Daily BMP, replace electrolytes PRN - Avoid nephrotoxic agents as able, ensure adequate renal perfusion - consider renal US   Seizure Disorder - continue outpatient regimen Depakote  & Keppra   Anemia  Mild Thrombocytopenia - Monitor for s/s of bleeding - PPI - Daily CBC, serial H&H - Transfuse for Hgb <8 - Monitor coag panel - Consider transfusion of platelets if < 10  Labs   CBC: Recent Labs  Lab 08/26/24 1926 08/26/24 2300 08/27/24 0636 08/27/24 1005 08/27/24 1223 08/27/24 1759 08/28/24 0015 08/28/24 0502 08/28/24 0503  WBC 6.1  --  6.3 5.2  --   --   --  7.5  --   NEUTROABS 4.3  --   --  2.7  --   --   --   --   --   HGB 8.5*   < > 8.2* 7.7* 7.3* 7.2* 6.8* 7.0* 6.9*  HCT 25.5*  --  23.8* 22.5* 21.4* 21.7* 20.2* 20.2* 20.6*  MCV 102.8*  --  100.4* 100.0  --   --   --  99.5  --   PLT 114*  --  83* 86*  --   --   --  77*  --    < > = values in this interval not displayed.    Basic Metabolic Panel: Recent Labs  Lab 08/26/24 1926 08/27/24 0522 08/27/24 0547 08/27/24 2046 08/28/24 0502  NA 139 141  --   --  140  K 3.3* 2.9*  --   --  3.4*  CL 102 108  --   --  109  CO2 22 25  --   --  24  GLUCOSE 175* 134*  --   --  105*  BUN 42* 37*  --   --  36*  CREATININE  2.02* 1.78*  --   --  1.80*  CALCIUM  8.5* 7.9*  --   --  8.3*  MG  --   --  1.7  --  1.6*  PHOS  --   --  1.5* 4.5 3.0   GFR: Estimated Creatinine Clearance: 32.9 mL/min (A) (by C-G formula based on SCr of 1.8 mg/dL (H)). Recent Labs  Lab 08/26/24 1926 08/27/24 0535 08/27/24 0636 08/27/24 1005 08/28/24 0502  WBC 6.1  --  6.3 5.2 7.5  LATICACIDVEN  --  2.2*  --  1.7  --  Liver Function Tests: Recent Labs  Lab 08/26/24 1926 08/28/24 0502  AST 58*  --   ALT 17  --   ALKPHOS 32*  --   BILITOT 0.8  --   PROT 5.8*  --   ALBUMIN 3.1* 2.4*   No results for input(s): LIPASE, AMYLASE in the last 168 hours. No results for input(s): AMMONIA in the last 168 hours.  ABG    Component Value Date/Time   PHART 7.42 11/01/2015 1348   PCO2ART 39 11/01/2015 1348   PO2ART 84 11/01/2015 1348   HCO3 30.6 (H) 08/26/2024 2013   O2SAT PENDING 08/26/2024 2013     Coagulation Profile: Recent Labs  Lab 08/26/24 1926  INR 1.0    Cardiac Enzymes: No results for input(s): CKTOTAL, CKMB, CKMBINDEX, TROPONINI in the last 168 hours.  HbA1C: Hgb A1c MFr Bld  Date/Time Value Ref Range Status  06/20/2020 05:02 AM 5.8 (H) 4.8 - 5.6 % Final    Comment:    (NOTE) Pre diabetes:          5.7%-6.4%  Diabetes:              >6.4%  Glycemic control for   <7.0% adults with diabetes   08/10/2019 12:53 AM 5.6 4.8 - 5.6 % Final    Comment:    (NOTE) Pre diabetes:          5.7%-6.4% Diabetes:              >6.4% Glycemic control for   <7.0% adults with diabetes     CBG: Recent Labs  Lab 08/27/24 0620  GLUCAP 121*    Review of Systems: Positives in BOLD  Gen: Denies fever, chills, weight change, fatigue, night sweats HEENT: Denies blurred vision, double vision, hearing loss, tinnitus, sinus congestion, rhinorrhea, sore throat, neck stiffness, dysphagia PULM: Denies shortness of breath, cough, sputum production, hemoptysis, wheezing CV: Denies chest pain, edema,  orthopnea, paroxysmal nocturnal dyspnea, palpitations GI: Denies abdominal pain, nausea, vomiting, diarrhea, hematochezia, melena, constipation, change in bowel habits GU: Denies dysuria, hematuria, polyuria, oliguria, urethral discharge Endocrine: Denies hot or cold intolerance, polyuria, polyphagia or appetite change Derm: Denies rash, dry skin, scaling or peeling skin change Heme: Denies easy bruising, bleeding, bleeding gums Neuro: Denies headache, numbness, weakness, slurred speech, loss of memory or consciousness  Past Medical History:  He,  has a past medical history of Anemia, GERD (gastroesophageal reflux disease), Hypertension, Hyponatremia, Kidney disease, Migraine, and Seizures (HCC).   Surgical History:   Past Surgical History:  Procedure Laterality Date   COLONOSCOPY WITH PROPOFOL  N/A 05/01/2016   Procedure: COLONOSCOPY WITH PROPOFOL ;  Surgeon: Rogelia Copping, MD;  Location: [facility] ENDOSCOPY;  Service: Endoscopy;  Laterality: N/A;   COLONOSCOPY WITH PROPOFOL  N/A 06/26/2022   Procedure: COLONOSCOPY WITH PROPOFOL ;  Surgeon: Copping Rogelia, MD;  Location: [facility] ENDOSCOPY;  Service: Endoscopy;  Laterality: N/A;   ESOPHAGOGASTRODUODENOSCOPY (EGD) WITH PROPOFOL  N/A 05/01/2016   Procedure: ESOPHAGOGASTRODUODENOSCOPY (EGD) WITH PROPOFOL ;  Surgeon: Rogelia Copping, MD;  Location: [facility] ENDOSCOPY;  Service: Endoscopy;  Laterality: N/A;   none       Social History:   reports that he has never smoked. He has never used smokeless tobacco. He reports that he does not drink alcohol and does not use drugs.   Family History:  His family history includes Heart attack in his father; Kidney disease in his father.   Allergies Allergies  Allergen Reactions   Enoxaparin  Itching    Per pt it makes my eyes steam  Nsaids Other (See Comments)    Kidney disease     Home Medications  Prior to Admission medications   Medication Sig Start Date End Date Taking? Authorizing Provider  alendronate  (FOSAMAX )  70 MG tablet Take 70 mg by mouth once a week. Take with a full glass of water on an empty stomach.   Yes [provider]  atorvastatin  (LIPITOR ) 80 MG tablet Take 80 mg by mouth at bedtime. 08/23/20  Yes [provider]  divalproex  (DEPAKOTE ) 250 MG DR tablet Take 3 tablets (750 mg total) by mouth every 12 (twelve) hours. Patient taking differently: Take 750 mg by mouth 2 (two) times daily. 11/18/15  Yes Vachhani, Vaibhavkumar, MD  levETIRAcetam  (KEPPRA ) 500 MG tablet Take 1 tablet (500 mg total) by mouth 2 (two) times daily. 10/01/20 08/27/24 Yes Awanda City, MD  lisinopril  (ZESTRIL ) 5 MG tablet Hold this medicine until followup with outpatient doctor because your blood pressure was normal in the hospital without it. 10/01/20  Yes Awanda City, MD  propranolol  (INDERAL ) 20 MG tablet Hold this medicine until followup with outpatient doctor because your heart rate was already low in the hospital without it. Patient taking differently: Take 20 mg by mouth 2 (two) times daily. Hold this medicine until followup with outpatient doctor because your heart rate was already low in the hospital without it. 10/01/20  Yes Awanda City, MD  tamsulosin  (FLOMAX ) 0.4 MG CAPS capsule Take 1 capsule (0.4 mg total) by mouth at bedtime. 10/01/20  Yes Awanda City, MD  feeding supplement, ENSURE ENLIVE, (ENSURE ENLIVE) LIQD Take 237 mLs by mouth 3 (three) times daily between meals. 09/15/16   Hower, Alm POUR, MD     Critical care provider statement:   Total critical care time: 33 minutes   Performed by: Parris MD   Critical care time was exclusive of separately billable procedures and treating other patients.   Critical care was necessary to treat or prevent imminent or life-threatening deterioration.   Critical care was time spent personally by me on the following activities: development of treatment plan with patient and/or surrogate as well as nursing, discussions with consultants, evaluation of patient's  response to treatment, examination of patient, obtaining history from patient or surrogate, ordering and performing treatments and interventions, ordering and review of laboratory studies, ordering and review of radiographic studies, pulse oximetry and re-evaluation of patient's condition.    Jacobb Alen, M.D.  Pulmonary & Critical Care Medicine

## 2024-08-29 DIAGNOSIS — N1832 Chronic kidney disease, stage 3b: Secondary | ICD-10-CM | POA: Diagnosis not present

## 2024-08-29 DIAGNOSIS — I4892 Unspecified atrial flutter: Secondary | ICD-10-CM | POA: Diagnosis not present

## 2024-08-29 DIAGNOSIS — I959 Hypotension, unspecified: Secondary | ICD-10-CM | POA: Diagnosis not present

## 2024-08-29 DIAGNOSIS — I480 Paroxysmal atrial fibrillation: Secondary | ICD-10-CM | POA: Diagnosis not present

## 2024-08-29 DIAGNOSIS — I4891 Unspecified atrial fibrillation: Secondary | ICD-10-CM | POA: Diagnosis not present

## 2024-08-29 DIAGNOSIS — J9601 Acute respiratory failure with hypoxia: Secondary | ICD-10-CM | POA: Diagnosis not present

## 2024-08-29 DIAGNOSIS — I10 Essential (primary) hypertension: Secondary | ICD-10-CM | POA: Diagnosis not present

## 2024-08-29 DIAGNOSIS — D61818 Other pancytopenia: Secondary | ICD-10-CM | POA: Diagnosis not present

## 2024-08-29 DIAGNOSIS — I2489 Other forms of acute ischemic heart disease: Secondary | ICD-10-CM | POA: Diagnosis not present

## 2024-08-29 DIAGNOSIS — N179 Acute kidney failure, unspecified: Secondary | ICD-10-CM | POA: Diagnosis not present

## 2024-08-29 LAB — RENAL FUNCTION PANEL
Albumin: 2.3 g/dL — ABNORMAL LOW (ref 3.5–5.0)
Anion gap: 6 (ref 5–15)
BUN: 31 mg/dL — ABNORMAL HIGH (ref 8–23)
CO2: 24 mmol/L (ref 22–32)
Calcium: 8.6 mg/dL — ABNORMAL LOW (ref 8.9–10.3)
Chloride: 106 mmol/L (ref 98–111)
Creatinine, Ser: 1.53 mg/dL — ABNORMAL HIGH (ref 0.61–1.24)
GFR, Estimated: 50 mL/min — ABNORMAL LOW (ref >60)
Glucose, Bld: 100 mg/dL — ABNORMAL HIGH (ref 70–99)
Phosphorus: 2.6 mg/dL (ref 2.5–4.6)
Potassium: 4.4 mmol/L (ref 3.5–5.1)
Sodium: 136 mmol/L (ref 135–145)

## 2024-08-29 LAB — TYPE AND SCREEN
ABO/RH(D): O POS
Antibody Screen: NEGATIVE
Unit division: 0

## 2024-08-29 LAB — BPAM RBC
Blood Product Expiration Date: 202510122359
ISSUE DATE / TIME: 202509120647
Unit Type and Rh: 5100

## 2024-08-29 LAB — CBC
HCT: 25.1 % — ABNORMAL LOW (ref 39.0–52.0)
Hemoglobin: 8.6 g/dL — ABNORMAL LOW (ref 13.0–17.0)
MCH: 32.2 pg (ref 26.0–34.0)
MCHC: 34.3 g/dL (ref 30.0–36.0)
MCV: 94 fL (ref 80.0–100.0)
Platelets: 89 K/uL — ABNORMAL LOW (ref 150–400)
RBC: 2.67 MIL/uL — ABNORMAL LOW (ref 4.22–5.81)
RDW: 19.5 % — ABNORMAL HIGH (ref 11.5–15.5)
WBC: 6.3 K/uL (ref 4.0–10.5)
nRBC: 0 % (ref 0.0–0.2)

## 2024-08-29 LAB — MAGNESIUM: Magnesium: 2.1 mg/dL (ref 1.7–2.4)

## 2024-08-29 MED ORDER — LACTATED RINGERS IV SOLN: INTRAVENOUS | Status: AC

## 2024-08-29 NOTE — Plan of Care (Signed)
 Continuing with plan of care.

## 2024-08-29 NOTE — Plan of Care (Signed)
  Problem: Clinical Measurements: Goal: Will remain free from infection Outcome: Progressing Goal: Respiratory complications will improve Outcome: Progressing   Problem: Nutrition: Goal: Adequate nutrition will be maintained Outcome: Progressing   Problem: Coping: Goal: Level of anxiety will decrease Outcome: Progressing   Problem: Elimination: Goal: Will not experience complications related to bowel motility Outcome: Progressing Goal: Will not experience complications related to urinary retention Outcome: Progressing   Problem: Pain Managment: Goal: General experience of comfort will improve and/or be controlled Outcome: Progressing   Problem: Safety: Goal: Ability to remain free from injury will improve Outcome: Progressing   Problem: Skin Integrity: Goal: Risk for impaired skin integrity will decrease Outcome: Progressing

## 2024-08-29 NOTE — Progress Notes (Signed)
 Hematology/Oncology Progress Note Telephone:([phone] Fax:([phone]) [phone]  CAEDMON LOUQUE   DOB:Mar 28, 1961   FM#:[id number]   RDW#:[id number]  History of Presenting Illness: patient is a 63 year old male admitted to the ICU for A-fib with RVR and chest pain requiring vasopressor support after diltiazem  induced hypotension.  Past medical history includes hypertension, seizure disorder (previously on carbamazepine ), essential tremor, chronic anemia, CKD stage IIIb, paroxysmal A-fib (2022), hyperparathyroidism (PTH 179, managed outpatient).  He is on aspirin  for stroke prevention per cardiology. He says that for the past week he has felt increasingly weak and fatigued with exertion.  He noticed swelling of his lower extremities.  When he nearly fell from fatigue at home he called EMS. He says that he has been anemic for many years.  Has previously required blood transfusions.  He says that medications were changed in the past for his seizures due to anemia.  He denies any black or bloody stools.  No bleeding or bruising.  No blood in his urine.  He does not take folate supplement. He lives at home alone. Goes to church weekly. Likes to spend time online on social media.   Interval History: Sitting up in bed. More alert. Says he feels better. Neosynephrine has been discontinued. Denies complaints.    Objective:  Vitals:   08/29/24 1400 08/29/24 1500  BP: 98/63 101/60  Pulse: 61 64  Resp: (!) 21 19  Temp:    SpO2: 100% 100%    Body mass index is 23.16 kg/m.  Intake/Output Summary (Last 24 hours) at 08/29/2024 1609 Last data filed at 08/29/2024 1500 Gross per 24 hour  Intake 680.58 ml  Output 1950 ml  Net -1269.42 ml   General: Well-developed, well-nourished, no acute distress. Eyes: Pink conjunctiva, anicteric sclera. Lungs: Clear to auscultation bilaterally.  No audible wheezing or coughing Heart: Regular rate and rhythm.  Abdomen: Soft, nontender, nondistended.  Musculoskeletal:  No edema, cyanosis, or clubbing. Neuro: Alert, answering all questions appropriately. Cranial nerves grossly intact. Skin: No rashes or petechiae noted. Psych: Normal affect.   Labs:  Lab Results  Component Value Date   WBC 6.3 08/29/2024   HGB 8.6 (L) 08/29/2024   HCT 25.1 (L) 08/29/2024   MCV 94.0 08/29/2024   PLT 89 (L) 08/29/2024   NEUTROABS 2.7 08/27/2024   Basic Metabolic Panel: Recent Labs  Lab 08/26/24 1926 08/27/24 0522 08/27/24 0547 08/27/24 2046 08/28/24 0502 08/29/24 0236  NA 139 141  --   --  140 136  K 3.3* 2.9*  --   --  3.4* 4.4  CL 102 108  --   --  109 106  CO2 22 25  --   --  24 24  GLUCOSE 175* 134*  --   --  105* 100*  BUN 42* 37*  --   --  36* 31*  CREATININE 2.02* 1.78*  --   --  1.80* 1.53*  CALCIUM  8.5* 7.9*  --   --  8.3* 8.6*  MG  --   --  1.7  --  1.6* 2.1  PHOS  --   --  1.5* 4.5 3.0 2.6   GFR Estimated Creatinine Clearance: 38.7 mL/min (A) (by C-G formula based on SCr of 1.53 mg/dL (H)).  Liver Function Tests: Recent Labs  Lab 08/26/24 1926 08/28/24 0502 08/29/24 0236  AST 58*  --   --   ALT 17  --   --   ALKPHOS 32*  --   --   BILITOT 0.8  --   --  PROT 5.8*  --   --   ALBUMIN 3.1* 2.4* 2.3*   No results for input(s): LIPASE, AMYLASE in the last 168 hours. No results for input(s): AMMONIA in the last 168 hours. Coagulation profile Recent Labs  Lab 08/26/24 1926  INR 1.0    CBC: Recent Labs  Lab 08/26/24 1926 08/26/24 2300 08/27/24 0636 08/27/24 1005 08/27/24 1223 08/28/24 0015 08/28/24 0502 08/28/24 0503 08/28/24 1231 08/29/24 0236  WBC 6.1  --  6.3 5.2  --   --  7.5  --   --  6.3  NEUTROABS 4.3  --   --  2.7  --   --   --   --   --   --   HGB 8.5*   < > 8.2* 7.7*   < > 6.8* 7.0* 6.9* 8.9* 8.6*  HCT 25.5*  --  23.8* 22.5*   < > 20.2* 20.2* 20.6* 26.8* 25.1*  MCV 102.8*  --  100.4* 100.0  --   --  99.5  --   --  94.0  PLT 114*  --  83* 86*  --   --  77*  --   --  89*   < > = values in this interval  not displayed.   Cardiac Enzymes: No results for input(s): CKTOTAL, CKMB, CKMBINDEX, TROPONINI in the last 168 hours. BNP: Invalid input(s): POCBNP CBG: Recent Labs  Lab 08/27/24 0620  GLUCAP 121*   D-Dimer No results for input(s): DDIMER in the last 72 hours. Hgb A1c No results for input(s): HGBA1C in the last 72 hours. Lipid Profile No results for input(s): CHOL, HDL, LDLCALC, TRIG, CHOLHDL, LDLDIRECT in the last 72 hours. Thyroid  function studies Recent Labs    08/27/24 1223  TSH 1.428   Anemia work up Recent Labs    08/26/24 1926 08/26/24 2130 08/26/24 2300 08/27/24 1005  VITAMINB12  --   --  3,077*  --   FOLATE  --  4.7*  --   --   FERRITIN  --  273  --   --   TIBC  --  274  --   --   IRON  --  150  --   --   RETICCTPCT 0.8  --   --  0.5   Microbiology Recent Results (from the past 240 hours)  MRSA Next Gen by PCR, Nasal     Status: None   Collection Time: 08/27/24  6:31 AM   Specimen: Nasal Mucosa; Nasal Swab  Result Value Ref Range Status   MRSA by PCR Next Gen NOT DETECTED NOT DETECTED Final    Comment: (NOTE) The GeneXpert MRSA Assay (FDA approved for NASAL specimens only), is one component of a comprehensive MRSA colonization surveillance program. It is not intended to diagnose MRSA infection nor to guide or monitor treatment for MRSA infections. Test performance is not FDA approved in patients less than 32 years old. Performed at [facility], [address] [place], [place] [address]   Culture, blood (Routine X 2) w Reflex to ID Panel     Status: None (Preliminary result)   Collection Time: 08/27/24 10:05 AM   Specimen: BLOOD  Result Value Ref Range Status   Specimen Description BLOOD BLOOD RIGHT HAND  Final   Special Requests   Final    BOTTLES DRAWN AEROBIC AND ANAEROBIC Blood Culture adequate volume   Culture   Final    NO GROWTH 2 DAYS Performed at [facility], [address]  [place], [place] [address]    Report Status PENDING  Incomplete  Culture, blood (Routine X 2) w Reflex to ID Panel     Status: None (Preliminary result)   Collection Time: 08/27/24 10:07 AM   Specimen: BLOOD  Result Value Ref Range Status   Specimen Description BLOOD BLOOD LEFT HAND  Final   Special Requests   Final    BOTTLES DRAWN AEROBIC AND ANAEROBIC Blood Culture adequate volume   Culture   Final    NO GROWTH 2 DAYS Performed at [facility], [address] [place], [place] [address]    Report Status PENDING  Incomplete   Studies:  No results found.  Assessment & Plan: 63 y.o. currently admitted to ICU for afib with rvr requiring vasopressor support. Hematology consulted for:   Anemia- hx of ckd. Baseline hmg between 10-11 up until July 2025. Hmg 8 on admission and declined to 6 requiring blood transfusion x 2. Iron studies, b12, tsh unremarkable. Folate levels were low, now on folate supplement. Reticulocyte count is low indicative of hypoproliferative anemia. has been on Depakote  and Keppra  for his seizure disorder for the last 5 years. Given that his hemoglobin decline has been between July and September 2025 and more acutely in the last couple of days it is unlikely that his antiepileptic drugs are contributing significantly to his anemia. GI workup and CT abdomen was recommended to rule out retroperitoneal bleed in presence of normal iron stores. Plan is to proceed with bone marrow biopsy on Monday. Patient in agreement. Plan to transfuse to maintain hmg > 7. Hmg today 8.6.  Thrombocytopenia- rare schistocytes seen in the peripheral smear. However given the normal LDH and low plasmic score this is not consistent with TTP. So far there is no evidence of hemolysis given the normal LDH low reticulocyte count and normal bilirubin. Haptoglobin normal. Plt 89. Transfuse plts if plt < 10.    Tinnie KANDICE Dawn, NP 08/29/2024   Medical Oncology and Hematology [facility]

## 2024-08-29 NOTE — Progress Notes (Signed)
 NAME:  [name], MRN:  [id number], DOB:  04/11/1961, LOS: 2 ADMISSION DATE:  08/26/2024, CONSULTATION DATE:  08/27/24 REFERRING MD:  Dr. Cleatus, CHIEF COMPLAINT: Chest pain    History of Present Illness:  63 yo M presenting to [facility] ED from home via EMS for evaluation of chest pain.  History provided per chart review and patient bedside report. Patient was in his normal state of health until 08/23/24 when he felt something hard squeezing my chest as he was walking back to the house while taking out the trash. He was not sure if this was his A-fib or a sign of a heart attack and self-administered propanolol which he takes PRN for palpitations and lay down. His chest pain improved however over the next few days he experienced generalized fatigue with dyspnea on exertion as well as increased lower extremity edema. He called EMS was he was unable to to bear his own weight. He denies fever/ chills, abdominal pain/ nausea/ vomiting/ diarrhea, urinary symptoms, new cough/ congestion, signs of bleeding, falls or LOC.  08/29/24- patient improved clinically, neosynephrine is being weaned off but still needed due to hypotension.  He was able to walk with PT.   Pertinent  Medical History  HTN Seizure disorder Essential Tremor Chronic Anemia CKD 3b PAF in 2022 on ASA Hyponatremia  Significant Hospital Events: Including procedures, antibiotic start and stop dates in addition to other pertinent events   08/26/24: Admit to Inpatient with A-fib RVR treated with Diltiazem . Overnight patient developed refractory hypotension suspect medication induced +/- cardiogenic etiology requiring vasopressor support. PCCM consulted and patient transferred to ICU  Interim History / Subjective:  Patient alert and responsive with stable vitals on RA and 75 mg of phenylephrine . Plan of care discussed, all questions and concerns answered at this time.  Objective    Blood pressure (!) 112/50, pulse 72, temperature  97.9 F (36.6 C), temperature source Oral, resp. rate 18, height 5' 3 (1.6 m), weight 59.3 kg, SpO2 99%.        Intake/Output Summary (Last 24 hours) at 08/29/2024 1018 Last data filed at 08/29/2024 0900 Gross per 24 hour  Intake 1063.04 ml  Output 1830 ml  Net -766.96 ml   Filed Weights   08/26/24 1927 08/27/24 0625  Weight: 54.4 kg 59.3 kg    Examination: General: Adult male, acutely ill, lying in bed, NAD HEENT: MM pink/moist, anicteric, atraumatic, neck supple Neuro: A&O x 4, able to follow commands, PERRL +3, MAE, essential tremor present in BUE CV: s1s2 RRR, a-fib bradycardia on monitor, no r/m/g Pulm: Regular, non labored on RA, breath sounds clear-BUL & diminished-BLL GI: soft, rounded, non tender, bs x 4 Skin: no rashes/lesions noted Extremities: warm/dry, pulses + 2 R/P, no edema noted  Resolved problem list   Assessment and Plan  Circulatory Shock -       With bradyarrythmia - due to propranolol  overdose and anemia     Patient takes BID propranolol   20mg  and also propranolol  20mg  PRN for AF which he states he took several extra pill daily due to feeling AF in his chest.            This has largely impproved post glucagon  therapy and he is now on neosyneprhine only with weaning.  He also has overlying reduced hb in parallel with other cell lines possibley due to bone marrow suppression with anti epileptic therapy.     He has not had any sings of bleeding .  He does  not have any clinical signs of sepsis or microbiology to suggest active infection.   Chronic / Paroxysmal Atrial Fibrillation with Rapid Ventricular Response- improved Chest Pain Mildly Elevated Troponin suspect secondary to demand ischemia Pre-syncope PMHx: HTN CHA2DS2-VASc score: 2 - consider Amiodarone vs low dose BB for rate control as diltiazem  appears to cause hypotension - hold off on systemic anticoagulation due to acute anemia - infectious work up to rule out sepsis: lactic, UA. CXR negative,  no fever or leukocytosis, low suspicion will hold off on antibiotics at this time. - start Phenylephrine , wean as tolerated to maintain > 65 and SBP > 90 - f/u Echocardiogram - hold propanolol due to hypotension - Cardiology consulted, appreciate input - Continuous cardiac monitoring  Acute Kidney Injury superimposed on CKD stage 3b  Baseline Cr: 1.6- 1.7, Cr on admission:2.02 - Strict I/O's: alert provider if UOP < 0.5 mL/kg/hr - gentle IVF hydration  - Daily BMP, replace electrolytes PRN - Avoid nephrotoxic agents as able, ensure adequate renal perfusion - consider renal US   Seizure Disorder - continue outpatient regimen Depakote  & Keppra   Anemia  Mild Thrombocytopenia - Monitor for s/s of bleeding - PPI - Daily CBC, serial H&H - Transfuse for Hgb <8 - Monitor coag panel - Consider transfusion of platelets if < 10  Labs   CBC: Recent Labs  Lab 08/26/24 1926 08/26/24 2300 08/27/24 0636 08/27/24 1005 08/27/24 1223 08/28/24 0015 08/28/24 0502 08/28/24 0503 08/28/24 1231 08/29/24 0236  WBC 6.1  --  6.3 5.2  --   --  7.5  --   --  6.3  NEUTROABS 4.3  --   --  2.7  --   --   --   --   --   --   HGB 8.5*   < > 8.2* 7.7*   < > 6.8* 7.0* 6.9* 8.9* 8.6*  HCT 25.5*  --  23.8* 22.5*   < > 20.2* 20.2* 20.6* 26.8* 25.1*  MCV 102.8*  --  100.4* 100.0  --   --  99.5  --   --  94.0  PLT 114*  --  83* 86*  --   --  77*  --   --  89*   < > = values in this interval not displayed.    Basic Metabolic Panel: Recent Labs  Lab 08/26/24 1926 08/27/24 0522 08/27/24 0547 08/27/24 2046 08/28/24 0502 08/29/24 0236  NA 139 141  --   --  140 136  K 3.3* 2.9*  --   --  3.4* 4.4  CL 102 108  --   --  109 106  CO2 22 25  --   --  24 24  GLUCOSE 175* 134*  --   --  105* 100*  BUN 42* 37*  --   --  36* 31*  CREATININE 2.02* 1.78*  --   --  1.80* 1.53*  CALCIUM  8.5* 7.9*  --   --  8.3* 8.6*  MG  --   --  1.7  --  1.6* 2.1  PHOS  --   --  1.5* 4.5 3.0 2.6   GFR: Estimated  Creatinine Clearance: 38.7 mL/min (A) (by C-G formula based on SCr of 1.53 mg/dL (H)). Recent Labs  Lab 08/27/24 0535 08/27/24 0636 08/27/24 1005 08/28/24 0502 08/29/24 0236  WBC  --  6.3 5.2 7.5 6.3  LATICACIDVEN 2.2*  --  1.7  --   --     Liver Function Tests: Recent Labs  Lab 08/26/24 1926 08/28/24 0502 08/29/24 0236  AST 58*  --   --   ALT 17  --   --   ALKPHOS 32*  --   --   BILITOT 0.8  --   --   PROT 5.8*  --   --   ALBUMIN 3.1* 2.4* 2.3*   No results for input(s): LIPASE, AMYLASE in the last 168 hours. No results for input(s): AMMONIA in the last 168 hours.  ABG    Component Value Date/Time   PHART 7.42 11/01/2015 1348   PCO2ART 39 11/01/2015 1348   PO2ART 84 11/01/2015 1348   HCO3 30.6 (H) 08/26/2024 2013   O2SAT 37.4 08/26/2024 2013     Coagulation Profile: Recent Labs  Lab 08/26/24 1926  INR 1.0    Cardiac Enzymes: No results for input(s): CKTOTAL, CKMB, CKMBINDEX, TROPONINI in the last 168 hours.  HbA1C: Hgb A1c MFr Bld  Date/Time Value Ref Range Status  06/20/2020 05:02 AM 5.8 (H) 4.8 - 5.6 % Final    Comment:    (NOTE) Pre diabetes:          5.7%-6.4%  Diabetes:              >6.4%  Glycemic control for   <7.0% adults with diabetes   08/10/2019 12:53 AM 5.6 4.8 - 5.6 % Final    Comment:    (NOTE) Pre diabetes:          5.7%-6.4% Diabetes:              >6.4% Glycemic control for   <7.0% adults with diabetes     CBG: Recent Labs  Lab 08/27/24 0620  GLUCAP 121*    Review of Systems: Positives in BOLD  Gen: Denies fever, chills, weight change, fatigue, night sweats HEENT: Denies blurred vision, double vision, hearing loss, tinnitus, sinus congestion, rhinorrhea, sore throat, neck stiffness, dysphagia PULM: Denies shortness of breath, cough, sputum production, hemoptysis, wheezing CV: Denies chest pain, edema, orthopnea, paroxysmal nocturnal dyspnea, palpitations GI: Denies abdominal pain, nausea, vomiting,  diarrhea, hematochezia, melena, constipation, change in bowel habits GU: Denies dysuria, hematuria, polyuria, oliguria, urethral discharge Endocrine: Denies hot or cold intolerance, polyuria, polyphagia or appetite change Derm: Denies rash, dry skin, scaling or peeling skin change Heme: Denies easy bruising, bleeding, bleeding gums Neuro: Denies headache, numbness, weakness, slurred speech, loss of memory or consciousness  Past Medical History:  He,  has a past medical history of Anemia, GERD (gastroesophageal reflux disease), Hypertension, Hyponatremia, Kidney disease, Migraine, and Seizures (HCC).   Surgical History:   Past Surgical History:  Procedure Laterality Date   COLONOSCOPY WITH PROPOFOL  N/A 05/01/2016   Procedure: COLONOSCOPY WITH PROPOFOL ;  Surgeon: Rogelia Copping, MD;  Location: [facility] ENDOSCOPY;  Service: Endoscopy;  Laterality: N/A;   COLONOSCOPY WITH PROPOFOL  N/A 06/26/2022   Procedure: COLONOSCOPY WITH PROPOFOL ;  Surgeon: Copping Rogelia, MD;  Location: [facility] ENDOSCOPY;  Service: Endoscopy;  Laterality: N/A;   ESOPHAGOGASTRODUODENOSCOPY (EGD) WITH PROPOFOL  N/A 05/01/2016   Procedure: ESOPHAGOGASTRODUODENOSCOPY (EGD) WITH PROPOFOL ;  Surgeon: Rogelia Copping, MD;  Location: [facility] ENDOSCOPY;  Service: Endoscopy;  Laterality: N/A;   none       Social History:   reports that he has never smoked. He has never used smokeless tobacco. He reports that he does not drink alcohol and does not use drugs.   Family History:  His family history includes Heart attack in his father; Kidney disease in his father.   Allergies Allergies  Allergen Reactions  Enoxaparin  Itching    Per pt it makes my eyes steam   Nsaids Other (See Comments)    Kidney disease     Home Medications  Prior to Admission medications   Medication Sig Start Date End Date Taking? Authorizing Provider  alendronate  (FOSAMAX ) 70 MG tablet Take 70 mg by mouth once a week. Take with a full glass of water on an empty stomach.    Yes [provider]  atorvastatin  (LIPITOR ) 80 MG tablet Take 80 mg by mouth at bedtime. 08/23/20  Yes [provider]  divalproex  (DEPAKOTE ) 250 MG DR tablet Take 3 tablets (750 mg total) by mouth every 12 (twelve) hours. Patient taking differently: Take 750 mg by mouth 2 (two) times daily. 11/18/15  Yes Vachhani, Vaibhavkumar, MD  levETIRAcetam  (KEPPRA ) 500 MG tablet Take 1 tablet (500 mg total) by mouth 2 (two) times daily. 10/01/20 08/27/24 Yes Awanda City, MD  lisinopril  (ZESTRIL ) 5 MG tablet Hold this medicine until followup with outpatient doctor because your blood pressure was normal in the hospital without it. 10/01/20  Yes Awanda City, MD  propranolol  (INDERAL ) 20 MG tablet Hold this medicine until followup with outpatient doctor because your heart rate was already low in the hospital without it. Patient taking differently: Take 20 mg by mouth 2 (two) times daily. Hold this medicine until followup with outpatient doctor because your heart rate was already low in the hospital without it. 10/01/20  Yes Awanda City, MD  tamsulosin  (FLOMAX ) 0.4 MG CAPS capsule Take 1 capsule (0.4 mg total) by mouth at bedtime. 10/01/20  Yes Awanda City, MD  feeding supplement, ENSURE ENLIVE, (ENSURE ENLIVE) LIQD Take 237 mLs by mouth 3 (three) times daily between meals. 09/15/16   Hower, Alm POUR, MD     Critical care provider statement:   Total critical care time: 33 minutes   Performed by: Parris MD   Critical care time was exclusive of separately billable procedures and treating other patients.   Critical care was necessary to treat or prevent imminent or life-threatening deterioration.   Critical care was time spent personally by me on the following activities: development of treatment plan with patient and/or surrogate as well as nursing, discussions with consultants, evaluation of patient's response to treatment, examination of patient, obtaining history from patient or surrogate, ordering  and performing treatments and interventions, ordering and review of laboratory studies, ordering and review of radiographic studies, pulse oximetry and re-evaluation of patient's condition.    Geofrey Silliman, M.D.  Pulmonary & Critical Care Medicine

## 2024-08-29 NOTE — Progress Notes (Signed)
 [facility] CLINIC CARDIOLOGY PROGRESS NOTE       Patient ID: [name] MRN: [id number] DOB/AGE: [birth year] 65 y.o.  Admit date: 08/26/2024 Referring Physician Dr. Parris Primary Physician Glover Lenis, MD Primary Cardiologist Dr. Ammon Reason for Consultation AFL RVR  HPI: [name] is a 63 y.o. male  with a past medical history of paroxysmal atrial fibrillation, hypertension, hyperlipidemia, CKD who presented to the ED on 08/26/2024 for intermittent palpitations a/w SOB for the past few days.  On arrival to ED patient found to be in atrial fibrillation RVR  rate 140s. IV Cardizem  initiated, led to hypotension requiring pressors. Cardiology was consulted for AF RVR.  Interval History: -Patient seen and examined this AM, laying comfortably in hospital bed. Denies any chest pain/pressure, SOB, lightheadedness/dizziness.  -Patients BP soft and HR  stable this AM. Per tele remains SR, rate 60s -Patient remains on room air with stable SpO2.  -Hgb up to 8.9 yesterday after transfusion, 8.6 on AM labs today. -K 4.4, Mg 2.1 after repletion yesterday.   Pertinent Cardiac History (Most recent) Stress test (09/29/2021) Regional wall motion:  reveals normal myocardial thickening and wall  motion.  The overall quality of the study is good.   Artifacts noted: no  Left ventricular cavity: normal.   Review of systems complete and found to be negative unless listed above    Past Medical History:  Diagnosis Date   Anemia    GERD (gastroesophageal reflux disease)    Hypertension    Hyponatremia    chronic   Kidney disease    Migraine    Seizures (HCC)     Past Surgical History:  Procedure Laterality Date   COLONOSCOPY WITH PROPOFOL  N/A 05/01/2016   Procedure: COLONOSCOPY WITH PROPOFOL ;  Surgeon: Rogelia Copping, MD;  Location: [facility] ENDOSCOPY;  Service: Endoscopy;  Laterality: N/A;   COLONOSCOPY WITH PROPOFOL  N/A 06/26/2022   Procedure: COLONOSCOPY WITH PROPOFOL ;   Surgeon: Copping Rogelia, MD;  Location: [facility] ENDOSCOPY;  Service: Endoscopy;  Laterality: N/A;   ESOPHAGOGASTRODUODENOSCOPY (EGD) WITH PROPOFOL  N/A 05/01/2016   Procedure: ESOPHAGOGASTRODUODENOSCOPY (EGD) WITH PROPOFOL ;  Surgeon: Rogelia Copping, MD;  Location: [facility] ENDOSCOPY;  Service: Endoscopy;  Laterality: N/A;   none      Medications Prior to Admission  Medication Sig Dispense Refill Last Dose/Taking   alendronate  (FOSAMAX ) 70 MG tablet Take 70 mg by mouth once a week. Take with a full glass of water on an empty stomach.   08/21/2024   atorvastatin  (LIPITOR ) 80 MG tablet Take 80 mg by mouth at bedtime.   08/25/2024   divalproex  (DEPAKOTE ) 250 MG DR tablet Take 3 tablets (750 mg total) by mouth every 12 (twelve) hours. (Patient taking differently: Take 750 mg by mouth 2 (two) times daily.) 100 tablet 1 08/26/2024 Morning   levETIRAcetam  (KEPPRA ) 500 MG tablet Take 1 tablet (500 mg total) by mouth 2 (two) times daily. 60 tablet 2 08/26/2024 Morning   lisinopril  (ZESTRIL ) 5 MG tablet Hold this medicine until followup with outpatient doctor because your blood pressure was normal in the hospital without it.   08/26/2024 Morning   propranolol  (INDERAL ) 20 MG tablet Hold this medicine until followup with outpatient doctor because your heart rate was already low in the hospital without it. (Patient taking differently: Take 20 mg by mouth 2 (two) times daily. Hold this medicine until followup with outpatient doctor because your heart rate was already low in the hospital without it.)   08/26/2024 Morning   tamsulosin  (FLOMAX ) 0.4 MG  CAPS capsule Take 1 capsule (0.4 mg total) by mouth at bedtime.   08/25/2024   feeding supplement, ENSURE ENLIVE, (ENSURE ENLIVE) LIQD Take 237 mLs by mouth 3 (three) times daily between meals. 237 mL 12    Social History   Socioeconomic History   Marital status: Single    Spouse name: Not on file   Number of children: Not on file   Years of education: Not on file   Highest education  level: Not on file  Occupational History   Not on file  Tobacco Use   Smoking status: Never   Smokeless tobacco: Never  Vaping Use   Vaping status: Never Used  Substance and Sexual Activity   Alcohol use: No   Drug use: No   Sexual activity: Not on file  Other Topics Concern   Not on file  Social History Narrative   Cross dresses    Social Drivers of Health   Financial Resource Strain: Low Risk  (07/10/2023)   Received from [facility]   Overall Financial Resource Strain (CARDIA)    Difficulty of Paying Living Expenses: Not hard at all  Food Insecurity: No Food Insecurity (07/10/2023)   Received from [facility]   Hunger Vital Sign    Within the past 12 months, you worried that your food would run out before you got the money to buy more.: Never true    Within the past 12 months, the food you bought just didn't last and you didn't have money to get more.: Never true  Transportation Needs: No Transportation Needs (07/10/2023)   Received from [facility] - Transportation    In the past 12 months, has lack of transportation kept you from medical appointments or from getting medications?: No    Lack of Transportation (Non-Medical): No  Physical Activity: Not on file  Stress: Not on file  Social Connections: Not on file  Intimate Partner Violence: Not on file    Family History  Problem Relation Age of Onset   Heart attack Father    Kidney disease Father      Vitals:   08/28/24 2015 08/29/24 0000 08/29/24 0400 08/29/24 0700  BP: (!) 99/59 106/60 90/62 (!) 94/59  Pulse: (!) 50 (!) 50 (!) 51 (!) 52  Resp: 17 19 16 17   Temp: 98.7 F (37.1 C) 98.6 F (37 C) 97.9 F (36.6 C)   TempSrc: Axillary Oral Oral   SpO2: 99% 100% 100% 98%  Weight:      Height:        PHYSICAL EXAM General: Chronically ill appearing male, well nourished, in no acute distress. HEENT: Normocephalic and atraumatic. Neck: No JVD.    Lungs: Normal respiratory effort on room air. Clear bilaterally to auscultation. No wheezes, crackles, rhonchi.  Heart: HRRR. Normal S1 and S2 without gallops or murmurs.  Abdomen: Non-distended appearing.  Msk: Normal strength and tone for age. Extremities: Warm and well perfused. No clubbing, cyanosis, edema.  Neuro: Alert and oriented X 3. Psych: Answers questions appropriately.   Labs: Basic Metabolic Panel: Recent Labs    08/28/24 0502 08/29/24 0236  NA 140 136  K 3.4* 4.4  CL 109 106  CO2 24 24  GLUCOSE 105* 100*  BUN 36* 31*  CREATININE 1.80* 1.53*  CALCIUM  8.3* 8.6*  MG 1.6* 2.1  PHOS 3.0 2.6   Liver Function Tests: Recent Labs    08/26/24 1926 08/28/24 0502 08/29/24 0236  AST  58*  --   --   ALT 17  --   --   ALKPHOS 32*  --   --   BILITOT 0.8  --   --   PROT 5.8*  --   --   ALBUMIN 3.1* 2.4* 2.3*   No results for input(s): LIPASE, AMYLASE in the last 72 hours. CBC: Recent Labs    08/26/24 1926 08/26/24 2300 08/27/24 1005 08/27/24 1223 08/28/24 0502 08/28/24 0503 08/28/24 1231 08/29/24 0236  WBC 6.1   < > 5.2  --  7.5  --   --  6.3  NEUTROABS 4.3  --  2.7  --   --   --   --   --   HGB 8.5*   < > 7.7*   < > 7.0*   < > 8.9* 8.6*  HCT 25.5*   < > 22.5*   < > 20.2*   < > 26.8* 25.1*  MCV 102.8*   < > 100.0  --  99.5  --   --  94.0  PLT 114*   < > 86*  --  77*  --   --  89*   < > = values in this interval not displayed.   Cardiac Enzymes: Recent Labs    08/26/24 1926 08/26/24 2130 08/27/24 0547  TROPONINIHS 24* 23* 25*   BNP: Recent Labs    08/26/24 2013  BNP 341.0*   D-Dimer: No results for input(s): DDIMER in the last 72 hours. Hemoglobin A1C: No results for input(s): HGBA1C in the last 72 hours. Fasting Lipid Panel: No results for input(s): CHOL, HDL, LDLCALC, TRIG, CHOLHDL, LDLDIRECT in the last 72 hours. Thyroid  Function Tests: Recent Labs    08/27/24 1223  TSH 1.428   Anemia Panel: Recent Labs     08/26/24 2130 08/26/24 2300 08/27/24 1005  VITAMINB12  --  3,077*  --   FOLATE 4.7*  --   --   FERRITIN 273  --   --   TIBC 274  --   --   IRON 150  --   --   RETICCTPCT  --   --  0.5     Radiology: ECHOCARDIOGRAM COMPLETE Result Date: 08/27/2024    ECHOCARDIOGRAM REPORT   Patient Name:   [name] Date of Exam: 08/27/2024 Medical Rec #:  [id number]        Height:       63.0 in Accession #:    [id number]       Weight:       130.7 lb Date of Birth:  04-30-1961        BSA:          1.614 m Patient Age:    63 years         BP:           88/63 mmHg Patient Gender: M                HR:           49 bpm. Exam Location:  [facility] Procedure: 2D Echo, Cardiac Doppler, Color Doppler and Intracardiac            Opacification Agent (Both Spectral and Color Flow Doppler were            utilized during procedure). Indications:     Atrial Flutter I48.92  History:         Patient has prior history of Echocardiogram examinations, most  recent 09/17/2020. Arrythmias:Atrial Flutter.  Sonographer:     Ashley McNeely-Sloane Referring Phys:  [id number] DELAYNE LULLA SOLIAN Diagnosing Phys: Marsa Dooms MD IMPRESSIONS  1. Left ventricular ejection fraction, by estimation, is 60 to 65%. The left ventricle has normal function. The left ventricle has no regional wall motion abnormalities. Left ventricular diastolic parameters are indeterminate.  2. Right ventricular systolic function is normal. The right ventricular size is normal.  3. The mitral valve is normal in structure. Trivial mitral valve regurgitation. No evidence of mitral stenosis.  4. The aortic valve is normal in structure. Aortic valve regurgitation is not visualized. No aortic stenosis is present.  5. The inferior vena cava is normal in size with greater than 50% respiratory variability, suggesting right atrial pressure of 3 mmHg. FINDINGS  Left Ventricle: Left ventricular ejection fraction, by estimation, is 60 to 65%. The left ventricle has normal  function. The left ventricle has no regional wall motion abnormalities. Definity  contrast agent was given IV to delineate the left ventricular  endocardial borders. Strain was performed and the global longitudinal strain is indeterminate. The left ventricular internal cavity size was normal in size. There is no left ventricular hypertrophy. Left ventricular diastolic parameters are indeterminate. Right Ventricle: The right ventricular size is normal. No increase in right ventricular wall thickness. Right ventricular systolic function is normal. Left Atrium: Left atrial size was normal in size. Right Atrium: Right atrial size was normal in size. Pericardium: There is no evidence of pericardial effusion. Mitral Valve: The mitral valve is normal in structure. Trivial mitral valve regurgitation. No evidence of mitral valve stenosis. MV peak gradient, 6.2 mmHg. The mean mitral valve gradient is 1.0 mmHg. Tricuspid Valve: The tricuspid valve is normal in structure. Tricuspid valve regurgitation is trivial. No evidence of tricuspid stenosis. Aortic Valve: The aortic valve is normal in structure. Aortic valve regurgitation is not visualized. No aortic stenosis is present. Aortic valve mean gradient measures 2.0 mmHg. Aortic valve peak gradient measures 4.8 mmHg. Aortic valve area, by VTI measures 2.39 cm. Pulmonic Valve: The pulmonic valve was normal in structure. Pulmonic valve regurgitation is not visualized. No evidence of pulmonic stenosis. Aorta: The aortic root is normal in size and structure. Venous: The inferior vena cava is normal in size with greater than 50% respiratory variability, suggesting right atrial pressure of 3 mmHg. IAS/Shunts: No atrial level shunt detected by color flow Doppler. Additional Comments: 3D was performed not requiring image post processing on an independent workstation and was indeterminate.  LEFT VENTRICLE PLAX 2D LVIDd:         3.60 cm     Diastology LVIDs:         2.20 cm     LV e'  medial:    9.99 cm/s LV PW:         0.90 cm     LV E/e' medial:  11.2 LV IVS:        0.80 cm     LV e' lateral:   14.60 cm/s LVOT diam:     1.90 cm     LV E/e' lateral: 7.7 LV SV:         65 LV SV Index:   40 LVOT Area:     2.84 cm  LV Volumes (MOD) LV vol d, MOD A2C: 61.6 ml LV vol d, MOD A4C: 60.7 ml LV vol s, MOD A2C: 25.7 ml LV vol s, MOD A4C: 26.1 ml LV SV MOD A2C:     35.9 ml LV  SV MOD A4C:     60.7 ml LV SV MOD BP:      34.9 ml RIGHT VENTRICLE            IVC RV Basal diam:  3.50 cm    IVC diam: 2.30 cm RV Mid diam:    2.60 cm RV S prime:     9.46 cm/s TAPSE (M-mode): 2.3 cm LEFT ATRIUM             Index        RIGHT ATRIUM           Index LA diam:        3.20 cm 1.98 cm/m   RA Area:     12.50 cm LA Vol (A2C):   27.4 ml 16.98 ml/m  RA Volume:   25.10 ml  15.55 ml/m LA Vol (A4C):   43.6 ml 27.01 ml/m LA Biplane Vol: 34.2 ml 21.19 ml/m  AORTIC VALVE                    PULMONIC VALVE AV Area (Vmax):    2.63 cm     PV Vmax:        0.70 m/s AV Area (Vmean):   2.50 cm     PV Vmean:       46.800 cm/s AV Area (VTI):     2.39 cm     PV VTI:         0.156 m AV Vmax:           109.00 cm/s  PV Peak grad:   1.9 mmHg AV Vmean:          71.400 cm/s  PV Mean grad:   1.0 mmHg AV VTI:            0.273 m      RVOT Peak grad: 1 mmHg AV Peak Grad:      4.8 mmHg AV Mean Grad:      2.0 mmHg LVOT Vmax:         101.00 cm/s LVOT Vmean:        62.900 cm/s LVOT VTI:          0.230 m LVOT/AV VTI ratio: 0.84  AORTA Ao Root diam: 3.40 cm MITRAL VALVE                TRICUSPID VALVE MV Area (PHT): 4.80 cm     TR Peak grad:   27.2 mmHg MV Area VTI:   1.97 cm     TR Vmax:        261.00 cm/s MV Peak grad:  6.2 mmHg MV Mean grad:  1.0 mmHg     SHUNTS MV Vmax:       1.25 m/s     Systemic VTI:  0.23 m MV Vmean:      45.9 cm/s    Systemic Diam: 1.90 cm MV Decel Time: 158 msec     Pulmonic VTI:  0.122 m MV E velocity: 112.00 cm/s MV A velocity: 44.50 cm/s MV E/A ratio:  2.52 Marsa Dooms MD Electronically signed by Marsa Dooms MD Signature Date/Time: 08/27/2024/1:26:42 PM    Final    CT Angio Chest PE W/Cm &/Or Wo Cm Result Date: 08/26/2024 EXAM: CTA of the Chest with contrast for PE 08/26/2024 09:05:01 PM TECHNIQUE: CTA of the chest was performed after the administration of intravenous contrast. Multiplanar reformatted images are provided for review. MIP images are provided for review. Automated exposure control,  iterative reconstruction, and/or weight based adjustment of the mA/kV was utilized to reduce the radiation dose to as low as reasonably achievable. COMPARISON: 09/23/2020 CLINICAL HISTORY: Pulmonary embolism (PE) suspected, high probability. Patient found on the floor by EMS with chest pain, atrial fibrillation with rapid ventricular response, hypotension, and hypoxia. Initial blood pressure was 74/48. FINDINGS: PULMONARY ARTERIES: Pulmonary arteries are adequately opacified for evaluation. No pulmonary embolism. Main pulmonary artery is normal in caliber. MEDIASTINUM: The heart and pericardium demonstrate no acute abnormality. No pericardial effusion. There is no acute abnormality of the thoracic aorta. Coronary artery and aortic atherosclerotic calcification. LYMPH NODES: No mediastinal, hilar or axillary lymphadenopathy. LUNGS AND PLEURA: The lungs are without acute process. No focal consolidation or pulmonary edema. No pleural effusion or pneumothorax. UPPER ABDOMEN: Limited images of the upper abdomen are unremarkable. SOFT TISSUES AND BONES: No acute bone or soft tissue abnormality. IMPRESSION: 1. No pulmonary embolism. 2. No acute pulmonary abnormality. Electronically signed by: Norman Gatlin MD 08/26/2024 09:10 PM EDT RP Workstation: [id number]   DG Chest 1 View Result Date: 08/26/2024 CLINICAL DATA:  Shortness of breath. EXAM: CHEST  1 VIEW COMPARISON:  Chest radiograph dated 12/17/2021. FINDINGS: The heart size and mediastinal contours are within normal limits. Both lungs are clear. The visualized  skeletal structures are unremarkable. IMPRESSION: No active disease. Electronically Signed   By: Vanetta Chou M.D.   On: 08/26/2024 19:59    ECHO as above..  TELEMETRY reviewed by me 08/29/2024: sinus rhythm, rate 60s  EKG reviewed by me: (09/10) with atrial fibrillation, rates 97 bpm.   Repeat EKG on 09/11 with sinus rhythm, rate 57 bpm.   Data reviewed by me 08/29/2024: last 24h vitals tele labs imaging I/O PCCM notes, oncology notes  Principal Problem:   Atrial flutter with rapid ventricular response (HCC) Active Problems:   Chest pain due to myocardial ischemia   Essential tremor   Essential hypertension   Elevated troponin   Postural dizziness with presyncope   Acute on chronic anemia of CKD lllb   Anemia of chronic renal failure, stage 3b (HCC)   Seizure disorder (HCC)   Acute hypotension   Acute renal failure superimposed on stage 3b chronic kidney disease (HCC)   Acute respiratory failure with hypoxia (HCC)   Hyperparathyroidism (HCC)   Dyspnea on exertion   Shock circulatory (HCC)    ASSESSMENT AND PLAN:  [name] is a 63 y.o. male  with a past medical history of paroxysmal atrial fibrillation, hypertension, hyperlipidemia, CKD who presented to the ED on 08/26/2024 for intermittent palpitations a/w SOB for the past few days.  On arrival to ED patient found to be in atrial fibrillation RVR  rate 140s. IV Cardizem  initiated, led to hypotension requiring pressors. Cardiology was consulted for AF RVR.  # Severe Anemia/thrombocytopenia # Atrial fibrillation RVR # Paroxysmal atrial fibrillation # Hypokalemia -Monitor and replenish electrolytes for a goal K >4, Mag >2  -Hold off on rate control medication (BB, CCB) due to baseline bradycardia.  -Previously anticoagulation had been deferred given his CHA2DS2VASc of 1. Now with age 32 yo and hx HTN CHA2DS2VASc score is at least 2, recommend PO Eliquis 2.5 mg BID for stroke risk reduction. Patient is agreeable. Will  hold off on starting at this time with severe anemia requiring multiple blood transfusions. -Plan to place cardiac monitor at discharge to further evaluation intermittent palpitations a/w SOB, likely in/out of AF. (This has improved since admission), severe anemia requiring blood transfusion likely contributing.  -  Scheduled outpatient EP for further evaluation, possibly discuss candidacy for ablation as stated below.  -Oncology consulted. Plan for bone marrow biopsy on Monday.  # Circulatory shock # Hypertension # Hyperlipidemia # Demand ischemia Patient without chest pain. Troponins minimally elevated and flat 24 > 23 > 25.  EKG without acute ischemic changes.  This admission with preserved EF, no RWMA, no significant valvular dysfunction. - Continue atorvastatin  80 mg daily. - Home antihypertensives (lisinopril  5 mg) held due to soft BP requiring pressors. - Continue IV phenylephrine  for soft BP.  Wean when able.  Recommend MAP > 65. Management per PCCM.  - Minimally elevated and flat troponins is most consistent with demand/supply mismatch and not ACS. No further cardiac diagnostics at this time.  Follow-up scheduled with Dr. Ammon on 09/25 at 10:30 AM  and with EP on 09/30 at 11 AM  This patient's plan of care was discussed and created with Dr. Florencio and he is in agreement.  Signed: Danita Bloch, PA-C  08/29/2024, 7:47 AM [facility]

## 2024-08-30 DIAGNOSIS — I959 Hypotension, unspecified: Secondary | ICD-10-CM | POA: Diagnosis not present

## 2024-08-30 DIAGNOSIS — I2489 Other forms of acute ischemic heart disease: Secondary | ICD-10-CM | POA: Diagnosis not present

## 2024-08-30 DIAGNOSIS — N1832 Chronic kidney disease, stage 3b: Secondary | ICD-10-CM | POA: Diagnosis not present

## 2024-08-30 DIAGNOSIS — I4891 Unspecified atrial fibrillation: Secondary | ICD-10-CM | POA: Diagnosis not present

## 2024-08-30 DIAGNOSIS — D61818 Other pancytopenia: Secondary | ICD-10-CM | POA: Diagnosis not present

## 2024-08-30 DIAGNOSIS — I10 Essential (primary) hypertension: Secondary | ICD-10-CM | POA: Diagnosis not present

## 2024-08-30 DIAGNOSIS — N179 Acute kidney failure, unspecified: Secondary | ICD-10-CM | POA: Diagnosis not present

## 2024-08-30 DIAGNOSIS — I480 Paroxysmal atrial fibrillation: Secondary | ICD-10-CM | POA: Diagnosis not present

## 2024-08-30 DIAGNOSIS — J9601 Acute respiratory failure with hypoxia: Secondary | ICD-10-CM | POA: Diagnosis not present

## 2024-08-30 LAB — RENAL FUNCTION PANEL
Albumin: 2.4 g/dL — ABNORMAL LOW (ref 3.5–5.0)
Anion gap: 9 (ref 5–15)
BUN: 29 mg/dL — ABNORMAL HIGH (ref 8–23)
CO2: 24 mmol/L (ref 22–32)
Calcium: 8.9 mg/dL (ref 8.9–10.3)
Chloride: 104 mmol/L (ref 98–111)
Creatinine, Ser: 1.78 mg/dL — ABNORMAL HIGH (ref 0.61–1.24)
GFR, Estimated: 42 mL/min — ABNORMAL LOW (ref >60)
Glucose, Bld: 85 mg/dL (ref 70–99)
Phosphorus: 2.7 mg/dL (ref 2.5–4.6)
Potassium: 5.7 mmol/L — ABNORMAL HIGH (ref 3.5–5.1)
Sodium: 137 mmol/L (ref 135–145)

## 2024-08-30 LAB — CBC
HCT: 26.3 % — ABNORMAL LOW (ref 39.0–52.0)
Hemoglobin: 9 g/dL — ABNORMAL LOW (ref 13.0–17.0)
MCH: 32.4 pg (ref 26.0–34.0)
MCHC: 34.2 g/dL (ref 30.0–36.0)
MCV: 94.6 fL (ref 80.0–100.0)
Platelets: 109 K/uL — ABNORMAL LOW (ref 150–400)
RBC: 2.78 MIL/uL — ABNORMAL LOW (ref 4.22–5.81)
RDW: 19 % — ABNORMAL HIGH (ref 11.5–15.5)
WBC: 6 K/uL (ref 4.0–10.5)
nRBC: 0 % (ref 0.0–0.2)

## 2024-08-30 LAB — BASIC METABOLIC PANEL WITH GFR
Anion gap: 8 (ref 5–15)
Anion gap: 8 (ref 5–15)
BUN: 32 mg/dL — ABNORMAL HIGH (ref 8–23)
BUN: 32 mg/dL — ABNORMAL HIGH (ref 8–23)
CO2: 24 mmol/L (ref 22–32)
CO2: 23 mmol/L (ref 22–32)
Calcium: 9 mg/dL (ref 8.9–10.3)
Calcium: 9 mg/dL (ref 8.9–10.3)
Chloride: 105 mmol/L (ref 98–111)
Chloride: 104 mmol/L (ref 98–111)
Creatinine, Ser: 2.06 mg/dL — ABNORMAL HIGH (ref 0.61–1.24)
Creatinine, Ser: 1.97 mg/dL — ABNORMAL HIGH (ref 0.61–1.24)
GFR, Estimated: 35 mL/min — ABNORMAL LOW (ref >60)
GFR, Estimated: 37 mL/min — ABNORMAL LOW (ref >60)
Glucose, Bld: 100 mg/dL — ABNORMAL HIGH (ref 70–99)
Glucose, Bld: 89 mg/dL (ref 70–99)
Potassium: 5.2 mmol/L — ABNORMAL HIGH (ref 3.5–5.1)
Potassium: 5.5 mmol/L — ABNORMAL HIGH (ref 3.5–5.1)
Sodium: 137 mmol/L (ref 135–145)
Sodium: 135 mmol/L (ref 135–145)

## 2024-08-30 LAB — MAGNESIUM: Magnesium: 1.7 mg/dL (ref 1.7–2.4)

## 2024-08-30 MED ORDER — PATIROMER SORBITEX CALCIUM 8.4 G PO PACK
8.4000 g | PACK | Freq: Every day | ORAL | Status: DC
  Administered 2024-08-30: 8.4 g via ORAL
  Filled 2024-08-30: qty 1

## 2024-08-30 MED ORDER — INSULIN ASPART 100 UNIT/ML IV SOLN
10.0000 [IU] | Freq: Once | INTRAVENOUS | Status: AC
  Administered 2024-08-30: 10 [IU] via INTRAVENOUS
  Filled 2024-08-30: qty 0.1

## 2024-08-30 MED ORDER — DEXTROSE 50 % IV SOLN
1.0000 | Freq: Once | INTRAVENOUS | Status: AC
Start: 2024-08-30 — End: 2024-08-30
  Administered 2024-08-30: 50 mL via INTRAVENOUS
  Filled 2024-08-30: qty 50

## 2024-08-30 NOTE — Plan of Care (Signed)
 Continuing with plan of care.

## 2024-08-30 NOTE — Progress Notes (Signed)
 [facility] CLINIC CARDIOLOGY PROGRESS NOTE       Patient ID: [name] MRN: [id number] DOB/AGE: 1961-07-05 63 y.o.  Admit date: 08/26/2024 Referring Physician Dr. Parris Primary Physician Glover Lenis, MD Primary Cardiologist Dr. Ammon Reason for Consultation AFL RVR  HPI: CLEARANCE [name] is a 63 y.o. male  with a past medical history of paroxysmal atrial fibrillation, hypertension, hyperlipidemia, CKD who presented to the ED on 08/26/2024 for intermittent palpitations a/w SOB for the past few days.  On arrival to ED patient found to be in atrial fibrillation RVR  rate 140s. IV Cardizem  initiated, led to hypotension requiring pressors. Cardiology was consulted for AF RVR.  Interval History: -Patient seen and examined this AM, sitting upright in hospital bed. Denies any chest pain/pressure, SOB, lightheadedness/dizziness.  -Patients BP borderline low but stable and HR  stable this AM. Per tele remains SR, rate 60s. Has been off neo since yesterday afternoon. -Patient remains on room air with stable SpO2.  -Hgb stable today at 9.0 -K 5.7, Mg 1.7 this AM.   Pertinent Cardiac History (Most recent) Stress test (09/29/2021) Regional wall motion:  reveals normal myocardial thickening and wall  motion.  The overall quality of the study is good.   Artifacts noted: no  Left ventricular cavity: normal.   Review of systems complete and found to be negative unless listed above    Past Medical History:  Diagnosis Date   Anemia    GERD (gastroesophageal reflux disease)    Hypertension    Hyponatremia    chronic   Kidney disease    Migraine    Seizures (HCC)     Past Surgical History:  Procedure Laterality Date   COLONOSCOPY WITH PROPOFOL  N/A 05/01/2016   Procedure: COLONOSCOPY WITH PROPOFOL ;  Surgeon: Rogelia Copping, MD;  Location: [facility] ENDOSCOPY;  Service: Endoscopy;  Laterality: N/A;   COLONOSCOPY WITH PROPOFOL  N/A 06/26/2022   Procedure: COLONOSCOPY WITH PROPOFOL ;   Surgeon: Copping Rogelia, MD;  Location: [facility] ENDOSCOPY;  Service: Endoscopy;  Laterality: N/A;   ESOPHAGOGASTRODUODENOSCOPY (EGD) WITH PROPOFOL  N/A 05/01/2016   Procedure: ESOPHAGOGASTRODUODENOSCOPY (EGD) WITH PROPOFOL ;  Surgeon: Rogelia Copping, MD;  Location: [facility] ENDOSCOPY;  Service: Endoscopy;  Laterality: N/A;   none      Medications Prior to Admission  Medication Sig Dispense Refill Last Dose/Taking   alendronate  (FOSAMAX ) 70 MG tablet Take 70 mg by mouth once a week. Take with a full glass of water on an empty stomach.   08/21/2024   atorvastatin  (LIPITOR ) 80 MG tablet Take 80 mg by mouth at bedtime.   08/25/2024   divalproex  (DEPAKOTE ) 250 MG DR tablet Take 3 tablets (750 mg total) by mouth every 12 (twelve) hours. (Patient taking differently: Take 750 mg by mouth 2 (two) times daily.) 100 tablet 1 08/26/2024 Morning   levETIRAcetam  (KEPPRA ) 500 MG tablet Take 1 tablet (500 mg total) by mouth 2 (two) times daily. 60 tablet 2 08/26/2024 Morning   lisinopril  (ZESTRIL ) 5 MG tablet Hold this medicine until followup with outpatient doctor because your blood pressure was normal in the hospital without it.   08/26/2024 Morning   propranolol  (INDERAL ) 20 MG tablet Hold this medicine until followup with outpatient doctor because your heart rate was already low in the hospital without it. (Patient taking differently: Take 20 mg by mouth 2 (two) times daily. Hold this medicine until followup with outpatient doctor because your heart rate was already low in the hospital without it.)   08/26/2024 Morning   tamsulosin  (FLOMAX )  0.4 MG CAPS capsule Take 1 capsule (0.4 mg total) by mouth at bedtime.   08/25/2024   feeding supplement, ENSURE ENLIVE, (ENSURE ENLIVE) LIQD Take 237 mLs by mouth 3 (three) times daily between meals. 237 mL 12    Social History   Socioeconomic History   Marital status: Single    Spouse name: Not on file   Number of children: Not on file   Years of education: Not on file   Highest education  level: Not on file  Occupational History   Not on file  Tobacco Use   Smoking status: Never   Smokeless tobacco: Never  Vaping Use   Vaping status: Never Used  Substance and Sexual Activity   Alcohol use: No   Drug use: No   Sexual activity: Not on file  Other Topics Concern   Not on file  Social History Narrative   Cross dresses    Social Drivers of Health   Financial Resource Strain: Low Risk  (07/10/2023)   Received from [facility]   Overall Financial Resource Strain (CARDIA)    Difficulty of Paying Living Expenses: Not hard at all  Food Insecurity: No Food Insecurity (07/10/2023)   Received from [facility]   Hunger Vital Sign    Within the past 12 months, you worried that your food would run out before you got the money to buy more.: Never true    Within the past 12 months, the food you bought just didn't last and you didn't have money to get more.: Never true  Transportation Needs: No Transportation Needs (07/10/2023)   Received from [facility] - Transportation    In the past 12 months, has lack of transportation kept you from medical appointments or from getting medications?: No    Lack of Transportation (Non-Medical): No  Physical Activity: Not on file  Stress: Not on file  Social Connections: Not on file  Intimate Partner Violence: Not on file    Family History  Problem Relation Age of Onset   Heart attack Father    Kidney disease Father      Vitals:   08/30/24 0500 08/30/24 0530 08/30/24 0600 08/30/24 0700  BP: 90/74  93/60 100/62  Pulse: (!) 52 (!) 52 (!) 53 (!) 54  Resp: 18 18 18 17   Temp:      TempSrc:      SpO2: 99% 100% 100% 100%  Weight:      Height:        PHYSICAL EXAM General: Chronically ill appearing male, well nourished, in no acute distress. HEENT: Normocephalic and atraumatic. Neck: No JVD.   Lungs: Normal respiratory effort on room air. Clear bilaterally to auscultation.  No wheezes, crackles, rhonchi.  Heart: HRRR. Normal S1 and S2 without gallops or murmurs.  Abdomen: Non-distended appearing.  Msk: Normal strength and tone for age. Extremities: Warm and well perfused. No clubbing, cyanosis, edema.  Neuro: Alert and oriented X 3. Psych: Answers questions appropriately.   Labs: Basic Metabolic Panel: Recent Labs    08/29/24 0236 08/30/24 0323  NA 136 137  K 4.4 5.7*  CL 106 104  CO2 24 24  GLUCOSE 100* 85  BUN 31* 29*  CREATININE 1.53* 1.78*  CALCIUM  8.6* 8.9  MG 2.1 1.7  PHOS 2.6 2.7   Liver Function Tests: Recent Labs    08/29/24 0236 08/30/24 0323  ALBUMIN 2.3* 2.4*   No results for input(s): LIPASE, AMYLASE in  the last 72 hours. CBC: Recent Labs    08/27/24 1005 08/27/24 1223 08/29/24 0236 08/30/24 0323  WBC 5.2   < > 6.3 6.0  NEUTROABS 2.7  --   --   --   HGB 7.7*   < > 8.6* 9.0*  HCT 22.5*   < > 25.1* 26.3*  MCV 100.0   < > 94.0 94.6  PLT 86*   < > 89* 109*   < > = values in this interval not displayed.   Cardiac Enzymes: No results for input(s): CKTOTAL, CKMB, CKMBINDEX, TROPONINIHS in the last 72 hours.  BNP: No results for input(s): BNP in the last 72 hours.  D-Dimer: No results for input(s): DDIMER in the last 72 hours. Hemoglobin A1C: No results for input(s): HGBA1C in the last 72 hours. Fasting Lipid Panel: No results for input(s): CHOL, HDL, LDLCALC, TRIG, CHOLHDL, LDLDIRECT in the last 72 hours. Thyroid  Function Tests: Recent Labs    08/27/24 1223  TSH 1.428   Anemia Panel: Recent Labs    08/27/24 1005  RETICCTPCT 0.5     Radiology: ECHOCARDIOGRAM COMPLETE Result Date: 08/27/2024    ECHOCARDIOGRAM REPORT   Patient Name:   MAKYLE ESLICK Date of Exam: 08/27/2024 Medical Rec #:  [id number]        Height:       63.0 in Accession #:    [id number]       Weight:       130.7 lb Date of Birth:  [birth year]        BSA:          1.614 m Patient Age:    63 years         BP:            88/63 mmHg Patient Gender: M                HR:           49 bpm. Exam Location:  [facility] Procedure: 2D Echo, Cardiac Doppler, Color Doppler and Intracardiac            Opacification Agent (Both Spectral and Color Flow Doppler were            utilized during procedure). Indications:     Atrial Flutter I48.92  History:         Patient has prior history of Echocardiogram examinations, most                  recent 09/17/2020. Arrythmias:Atrial Flutter.  Sonographer:     Ashley McNeely-Sloane Referring Phys:  [id number] DELAYNE LULLA SOLIAN Diagnosing Phys: Marsa Dooms MD IMPRESSIONS  1. Left ventricular ejection fraction, by estimation, is 60 to 65%. The left ventricle has normal function. The left ventricle has no regional wall motion abnormalities. Left ventricular diastolic parameters are indeterminate.  2. Right ventricular systolic function is normal. The right ventricular size is normal.  3. The mitral valve is normal in structure. Trivial mitral valve regurgitation. No evidence of mitral stenosis.  4. The aortic valve is normal in structure. Aortic valve regurgitation is not visualized. No aortic stenosis is present.  5. The inferior vena cava is normal in size with greater than 50% respiratory variability, suggesting right atrial pressure of 3 mmHg. FINDINGS  Left Ventricle: Left ventricular ejection fraction, by estimation, is 60 to 65%. The left ventricle has normal function. The left ventricle has no regional wall motion abnormalities. Definity  contrast agent was given IV to delineate the left  ventricular  endocardial borders. Strain was performed and the global longitudinal strain is indeterminate. The left ventricular internal cavity size was normal in size. There is no left ventricular hypertrophy. Left ventricular diastolic parameters are indeterminate. Right Ventricle: The right ventricular size is normal. No increase in right ventricular wall thickness. Right ventricular systolic function is normal.  Left Atrium: Left atrial size was normal in size. Right Atrium: Right atrial size was normal in size. Pericardium: There is no evidence of pericardial effusion. Mitral Valve: The mitral valve is normal in structure. Trivial mitral valve regurgitation. No evidence of mitral valve stenosis. MV peak gradient, 6.2 mmHg. The mean mitral valve gradient is 1.0 mmHg. Tricuspid Valve: The tricuspid valve is normal in structure. Tricuspid valve regurgitation is trivial. No evidence of tricuspid stenosis. Aortic Valve: The aortic valve is normal in structure. Aortic valve regurgitation is not visualized. No aortic stenosis is present. Aortic valve mean gradient measures 2.0 mmHg. Aortic valve peak gradient measures 4.8 mmHg. Aortic valve area, by VTI measures 2.39 cm. Pulmonic Valve: The pulmonic valve was normal in structure. Pulmonic valve regurgitation is not visualized. No evidence of pulmonic stenosis. Aorta: The aortic root is normal in size and structure. Venous: The inferior vena cava is normal in size with greater than 50% respiratory variability, suggesting right atrial pressure of 3 mmHg. IAS/Shunts: No atrial level shunt detected by color flow Doppler. Additional Comments: 3D was performed not requiring image post processing on an independent workstation and was indeterminate.  LEFT VENTRICLE PLAX 2D LVIDd:         3.60 cm     Diastology LVIDs:         2.20 cm     LV e' medial:    9.99 cm/s LV PW:         0.90 cm     LV E/e' medial:  11.2 LV IVS:        0.80 cm     LV e' lateral:   14.60 cm/s LVOT diam:     1.90 cm     LV E/e' lateral: 7.7 LV SV:         65 LV SV Index:   40 LVOT Area:     2.84 cm  LV Volumes (MOD) LV vol d, MOD A2C: 61.6 ml LV vol d, MOD A4C: 60.7 ml LV vol s, MOD A2C: 25.7 ml LV vol s, MOD A4C: 26.1 ml LV SV MOD A2C:     35.9 ml LV SV MOD A4C:     60.7 ml LV SV MOD BP:      34.9 ml RIGHT VENTRICLE            IVC RV Basal diam:  3.50 cm    IVC diam: 2.30 cm RV Mid diam:    2.60 cm RV S prime:      9.46 cm/s TAPSE (M-mode): 2.3 cm LEFT ATRIUM             Index        RIGHT ATRIUM           Index LA diam:        3.20 cm 1.98 cm/m   RA Area:     12.50 cm LA Vol (A2C):   27.4 ml 16.98 ml/m  RA Volume:   25.10 ml  15.55 ml/m LA Vol (A4C):   43.6 ml 27.01 ml/m LA Biplane Vol: 34.2 ml 21.19 ml/m  AORTIC VALVE  PULMONIC VALVE AV Area (Vmax):    2.63 cm     PV Vmax:        0.70 m/s AV Area (Vmean):   2.50 cm     PV Vmean:       46.800 cm/s AV Area (VTI):     2.39 cm     PV VTI:         0.156 m AV Vmax:           109.00 cm/s  PV Peak grad:   1.9 mmHg AV Vmean:          71.400 cm/s  PV Mean grad:   1.0 mmHg AV VTI:            0.273 m      RVOT Peak grad: 1 mmHg AV Peak Grad:      4.8 mmHg AV Mean Grad:      2.0 mmHg LVOT Vmax:         101.00 cm/s LVOT Vmean:        62.900 cm/s LVOT VTI:          0.230 m LVOT/AV VTI ratio: 0.84  AORTA Ao Root diam: 3.40 cm MITRAL VALVE                TRICUSPID VALVE MV Area (PHT): 4.80 cm     TR Peak grad:   27.2 mmHg MV Area VTI:   1.97 cm     TR Vmax:        261.00 cm/s MV Peak grad:  6.2 mmHg MV Mean grad:  1.0 mmHg     SHUNTS MV Vmax:       1.25 m/s     Systemic VTI:  0.23 m MV Vmean:      45.9 cm/s    Systemic Diam: 1.90 cm MV Decel Time: 158 msec     Pulmonic VTI:  0.122 m MV E velocity: 112.00 cm/s MV A velocity: 44.50 cm/s MV E/A ratio:  2.52 Marsa Dooms MD Electronically signed by Marsa Dooms MD Signature Date/Time: 08/27/2024/1:26:42 PM    Final    CT Angio Chest PE W/Cm &/Or Wo Cm Result Date: 08/26/2024 EXAM: CTA of the Chest with contrast for PE 08/26/2024 09:05:01 PM TECHNIQUE: CTA of the chest was performed after the administration of intravenous contrast. Multiplanar reformatted images are provided for review. MIP images are provided for review. Automated exposure control, iterative reconstruction, and/or weight based adjustment of the mA/kV was utilized to reduce the radiation dose to as low as reasonably achievable.  COMPARISON: 09/23/2020 CLINICAL HISTORY: Pulmonary embolism (PE) suspected, high probability. Patient found on the floor by EMS with chest pain, atrial fibrillation with rapid ventricular response, hypotension, and hypoxia. Initial blood pressure was 74/48. FINDINGS: PULMONARY ARTERIES: Pulmonary arteries are adequately opacified for evaluation. No pulmonary embolism. Main pulmonary artery is normal in caliber. MEDIASTINUM: The heart and pericardium demonstrate no acute abnormality. No pericardial effusion. There is no acute abnormality of the thoracic aorta. Coronary artery and aortic atherosclerotic calcification. LYMPH NODES: No mediastinal, hilar or axillary lymphadenopathy. LUNGS AND PLEURA: The lungs are without acute process. No focal consolidation or pulmonary edema. No pleural effusion or pneumothorax. UPPER ABDOMEN: Limited images of the upper abdomen are unremarkable. SOFT TISSUES AND BONES: No acute bone or soft tissue abnormality. IMPRESSION: 1. No pulmonary embolism. 2. No acute pulmonary abnormality. Electronically signed by: Norman Gatlin MD 08/26/2024 09:10 PM EDT RP Workstation: [id number]   DG Chest 1 View Result Date:  08/26/2024 CLINICAL DATA:  Shortness of breath. EXAM: CHEST  1 VIEW COMPARISON:  Chest radiograph dated 12/17/2021. FINDINGS: The heart size and mediastinal contours are within normal limits. Both lungs are clear. The visualized skeletal structures are unremarkable. IMPRESSION: No active disease. Electronically Signed   By: Vanetta Chou M.D.   On: 08/26/2024 19:59    ECHO as above..  TELEMETRY reviewed by me 08/30/2024: sinus rhythm, rate 50s  EKG reviewed by me: (09/10) with atrial fibrillation, rates 97 bpm.   Repeat EKG on 09/11 with sinus rhythm, rate 57 bpm.   Data reviewed by me 08/30/2024: last 24h vitals tele labs imaging I/O PCCM notes, oncology notes  Principal Problem:   Atrial flutter with rapid ventricular response (HCC) Active Problems:   Chest  pain due to myocardial ischemia   Essential tremor   Essential hypertension   Elevated troponin   Postural dizziness with presyncope   Acute on chronic anemia of CKD lllb   Anemia of chronic renal failure, stage 3b (HCC)   Seizure disorder (HCC)   Acute hypotension   Acute renal failure superimposed on stage 3b chronic kidney disease (HCC)   Acute respiratory failure with hypoxia (HCC)   Hyperparathyroidism (HCC)   Dyspnea on exertion   Shock circulatory (HCC)    ASSESSMENT AND PLAN:  MILOH ALCOCER is a 63 y.o. male  with a past medical history of paroxysmal atrial fibrillation, hypertension, hyperlipidemia, CKD who presented to the ED on 08/26/2024 for intermittent palpitations a/w SOB for the past few days.  On arrival to ED patient found to be in atrial fibrillation RVR  rate 140s. IV Cardizem  initiated, led to hypotension requiring pressors. Cardiology was consulted for AF RVR.  # Severe Anemia/thrombocytopenia # Atrial fibrillation RVR # Paroxysmal atrial fibrillation # Hypokalemia -Monitor and replenish electrolytes for a goal K >4, Mag >2  -Hold off on rate control medication (BB, CCB) due to baseline bradycardia. Patient reportedly was taking multiple doses of propranolol  per day and has been treated for BB overdose while admitted. Will likely defer initiation of any BB on discharge, especially propranolol . -Previously anticoagulation had been deferred given his CHA2DS2VASc of 1. Now with age 26 yo and hx HTN CHA2DS2VASc score is at least 2, recommend PO Eliquis 2.5 mg BID for stroke risk reduction. Patient is agreeable. Will hold off on starting at this time with severe anemia requiring multiple blood transfusions. -Plan to place cardiac monitor at discharge to further evaluation intermittent palpitations a/w SOB, likely in/out of AF. (This has improved since admission), severe anemia requiring blood transfusion likely contributing.  -Scheduled outpatient EP for further  evaluation, possibly discuss candidacy for ablation as stated below.  -Oncology consulted. Plan for bone marrow biopsy on Monday.  # Circulatory shock # Hypertension # Hyperlipidemia # Demand ischemia Patient without chest pain. Troponins minimally elevated and flat 24 > 23 > 25.  EKG without acute ischemic changes.  This admission with preserved EF, no RWMA, no significant valvular dysfunction. - Continue atorvastatin  80 mg daily. - Home antihypertensives (lisinopril  5 mg) held due to soft BP requiring pressors. - BP stable off neo since 9/13. Recommend MAP > 65. Management per PCCM.  - Minimally elevated and flat troponins is most consistent with demand/supply mismatch and not ACS. No further cardiac diagnostics at this time.  Follow-up scheduled with Dr. Ammon on 09/25 at 10:30 AM  and with EP on 09/30 at 11 AM  This patient's plan of care was discussed and created with  Dr. Florencio and he is in agreement.  Signed: Danita Bloch, PA-C  08/30/2024, 7:48 AM [facility] Cardiology

## 2024-08-30 NOTE — Progress Notes (Signed)
 Per provider, internal urinary catheter needs to remain in place at this time.  Provider placed order for urology consult.

## 2024-08-30 NOTE — Progress Notes (Signed)
 NAME:  [name], MRN:  [id number], DOB:  05/06/1961, LOS: 3 ADMISSION DATE:  08/26/2024, CONSULTATION DATE:  08/27/24 REFERRING MD:  Dr. Cleatus, CHIEF COMPLAINT: Chest pain    History of Present Illness:  62 yo M presenting to [facility] ED from home via EMS for evaluation of chest pain.  History provided per chart review and patient bedside report. Patient was in his normal state of health until 08/23/24 when he felt something hard squeezing my chest as he was walking back to the house while taking out the trash. He was not sure if this was his A-fib or a sign of a heart attack and self-administered propanolol which he takes PRN for palpitations and lay down. His chest pain improved however over the next few days he experienced generalized fatigue with dyspnea on exertion as well as increased lower extremity edema. He called EMS was he was unable to to bear his own weight. He denies fever/ chills, abdominal pain/ nausea/ vomiting/ diarrhea, urinary symptoms, new cough/ congestion, signs of bleeding, falls or LOC.  08/29/24- patient improved clinically, neosynephrine is being weaned off but still needed due to hypotension.  He was able to walk with PT.  08/30/24- patient is off neosynephrine. His CKD is stable.  He does have BM biopsy planned to investigate AED induced BMS.   He will be able to fully address this issue on outpatient.  Its likely partially responsive for some of his blood work abnormalities vs CKD induced chronic anemia.    Pertinent  Medical History  HTN Seizure disorder Essential Tremor Chronic Anemia CKD 3b PAF in 2022 on ASA Hyponatremia   Objective    Blood pressure 108/61, pulse (!) 58, temperature 98.1 F (36.7 C), temperature source Oral, resp. rate 15, height 5' 3 (1.6 m), weight 59.6 kg, SpO2 100%.        Intake/Output Summary (Last 24 hours) at 08/30/2024 0843 Last data filed at 08/30/2024 0500 Gross per 24 hour  Intake 1939.37 ml  Output 3175 ml  Net  -1235.63 ml   Filed Weights   08/26/24 1927 08/27/24 0625 08/30/24 0418  Weight: 54.4 kg 59.3 kg 59.6 kg    Examination: General: Adult male, acutely ill, lying in bed, NAD HEENT: MM pink/moist, anicteric, atraumatic, neck supple Neuro: A&O x 4, able to follow commands, PERRL +3, MAE, essential tremor present in BUE CV: s1s2 RRR, a-fib bradycardia on monitor, no r/m/g Pulm: Regular, non labored on RA, breath sounds clear-BUL & diminished-BLL GI: soft, rounded, non tender, bs x 4 Skin: no rashes/lesions noted Extremities: warm/dry, pulses + 2 R/P, no edema noted   Assessment and Plan  Circulatory Shock - RESOLVED       With bradyarrythmia - due to propranolol  overdose and anemia     Patient takes BID propranolol   20mg  and also propranolol  20mg  PRN for AF which he states he took several extra pill daily due to feeling AF in his chest.            This has largely impproved post glucagon  therapy and he is now on neosyneprhine only with weaning.  He also has overlying reduced hb in parallel with other cell lines possibley due to bone marrow suppression with anti epileptic therapy.     He has not had any sings of bleeding .  He does not have any clinical signs of sepsis or microbiology to suggest active infection.   Chronic / Paroxysmal Atrial Fibrillation with Rapid Ventricular Response- improved Chest Pain  Mildly Elevated Troponin suspect secondary to demand ischemia Pre-syncope PMHx: HTN CHA2DS2-VASc score: 2 - consider Amiodarone vs low dose BB for rate control as diltiazem  appears to cause hypotension - hold off on systemic anticoagulation due to acute anemia - infectious work up to rule out sepsis: lactic, UA. CXR negative, no fever or leukocytosis, low suspicion will hold off on antibiotics at this time. - start Phenylephrine , wean as tolerated to maintain > 65 and SBP > 90 - f/u Echocardiogram - hold propanolol due to hypotension - Cardiology consulted, appreciate input -  Continuous cardiac monitoring  Acute Kidney Injury superimposed on CKD stage 3b  Baseline Cr: 1.6- 1.7, Cr on admission:2.02 - Strict I/O's: alert provider if UOP < 0.5 mL/kg/hr - gentle IVF hydration  - Daily BMP, replace electrolytes PRN - Avoid nephrotoxic agents as able, ensure adequate renal perfusion - consider renal US   Seizure Disorder - continue outpatient regimen Depakote  & Keppra   Anemia  Mild Thrombocytopenia - Monitor for s/s of bleeding - PPI - Daily CBC, serial H&H - Transfuse for Hgb <8 - Monitor coag panel - Consider transfusion of platelets if < 10  Labs   CBC: Recent Labs  Lab 08/26/24 1926 08/26/24 2300 08/27/24 0636 08/27/24 1005 08/27/24 1223 08/28/24 0502 08/28/24 0503 08/28/24 1231 08/29/24 0236 08/30/24 0323  WBC 6.1  --  6.3 5.2  --  7.5  --   --  6.3 6.0  NEUTROABS 4.3  --   --  2.7  --   --   --   --   --   --   HGB 8.5*   < > 8.2* 7.7*   < > 7.0* 6.9* 8.9* 8.6* 9.0*  HCT 25.5*  --  23.8* 22.5*   < > 20.2* 20.6* 26.8* 25.1* 26.3*  MCV 102.8*  --  100.4* 100.0  --  99.5  --   --  94.0 94.6  PLT 114*  --  83* 86*  --  77*  --   --  89* 109*   < > = values in this interval not displayed.    Basic Metabolic Panel: Recent Labs  Lab 08/26/24 1926 08/27/24 0522 08/27/24 0547 08/27/24 2046 08/28/24 0502 08/29/24 0236 08/30/24 0323  NA 139 141  --   --  140 136 137  K 3.3* 2.9*  --   --  3.4* 4.4 5.7*  CL 102 108  --   --  109 106 104  CO2 22 25  --   --  24 24 24   GLUCOSE 175* 134*  --   --  105* 100* 85  BUN 42* 37*  --   --  36* 31* 29*  CREATININE 2.02* 1.78*  --   --  1.80* 1.53* 1.78*  CALCIUM  8.5* 7.9*  --   --  8.3* 8.6* 8.9  MG  --   --  1.7  --  1.6* 2.1 1.7  PHOS  --   --  1.5* 4.5 3.0 2.6 2.7   GFR: Estimated Creatinine Clearance: 33.3 mL/min (A) (by C-G formula based on SCr of 1.78 mg/dL (H)). Recent Labs  Lab 08/27/24 0535 08/27/24 0636 08/27/24 1005 08/28/24 0502 08/29/24 0236 08/30/24 0323  WBC  --    < >  5.2 7.5 6.3 6.0  LATICACIDVEN 2.2*  --  1.7  --   --   --    < > = values in this interval not displayed.    Liver Function Tests: Recent Labs  Lab 08/26/24 1926 08/28/24 0502 08/29/24 0236 08/30/24 0323  AST 58*  --   --   --   ALT 17  --   --   --   ALKPHOS 32*  --   --   --   BILITOT 0.8  --   --   --   PROT 5.8*  --   --   --   ALBUMIN 3.1* 2.4* 2.3* 2.4*   No results for input(s): LIPASE, AMYLASE in the last 168 hours. No results for input(s): AMMONIA in the last 168 hours.  ABG    Component Value Date/Time   PHART 7.42 11/01/2015 1348   PCO2ART 39 11/01/2015 1348   PO2ART 84 11/01/2015 1348   HCO3 30.6 (H) 08/26/2024 2013   O2SAT 37.4 08/26/2024 2013     Coagulation Profile: Recent Labs  Lab 08/26/24 1926  INR 1.0    Cardiac Enzymes: No results for input(s): CKTOTAL, CKMB, CKMBINDEX, TROPONINI in the last 168 hours.  HbA1C: Hgb A1c MFr Bld  Date/Time Value Ref Range Status  06/20/2020 05:02 AM 5.8 (H) 4.8 - 5.6 % Final    Comment:    (NOTE) Pre diabetes:          5.7%-6.4%  Diabetes:              >6.4%  Glycemic control for   <7.0% adults with diabetes   08/10/2019 12:53 AM 5.6 4.8 - 5.6 % Final    Comment:    (NOTE) Pre diabetes:          5.7%-6.4% Diabetes:              >6.4% Glycemic control for   <7.0% adults with diabetes     CBG: Recent Labs  Lab 08/27/24 0620  GLUCAP 121*    Review of Systems: Positives in BOLD  Gen: Denies fever, chills, weight change, fatigue, night sweats HEENT: Denies blurred vision, double vision, hearing loss, tinnitus, sinus congestion, rhinorrhea, sore throat, neck stiffness, dysphagia PULM: Denies shortness of breath, cough, sputum production, hemoptysis, wheezing CV: Denies chest pain, edema, orthopnea, paroxysmal nocturnal dyspnea, palpitations GI: Denies abdominal pain, nausea, vomiting, diarrhea, hematochezia, melena, constipation, change in bowel habits GU: Denies dysuria,  hematuria, polyuria, oliguria, urethral discharge Endocrine: Denies hot or cold intolerance, polyuria, polyphagia or appetite change Derm: Denies rash, dry skin, scaling or peeling skin change Heme: Denies easy bruising, bleeding, bleeding gums Neuro: Denies headache, numbness, weakness, slurred speech, loss of memory or consciousness  Past Medical History:  He,  has a past medical history of Anemia, GERD (gastroesophageal reflux disease), Hypertension, Hyponatremia, Kidney disease, Migraine, and Seizures (HCC).   Surgical History:   Past Surgical History:  Procedure Laterality Date   COLONOSCOPY WITH PROPOFOL  N/A 05/01/2016   Procedure: COLONOSCOPY WITH PROPOFOL ;  Surgeon: Rogelia Copping, MD;  Location: [facility] ENDOSCOPY;  Service: Endoscopy;  Laterality: N/A;   COLONOSCOPY WITH PROPOFOL  N/A 06/26/2022   Procedure: COLONOSCOPY WITH PROPOFOL ;  Surgeon: Copping Rogelia, MD;  Location: [facility] ENDOSCOPY;  Service: Endoscopy;  Laterality: N/A;   ESOPHAGOGASTRODUODENOSCOPY (EGD) WITH PROPOFOL  N/A 05/01/2016   Procedure: ESOPHAGOGASTRODUODENOSCOPY (EGD) WITH PROPOFOL ;  Surgeon: Rogelia Copping, MD;  Location: [facility] ENDOSCOPY;  Service: Endoscopy;  Laterality: N/A;   none       Social History:   reports that he has never smoked. He has never used smokeless tobacco. He reports that he does not drink alcohol and does not use drugs.   Family History:  His family history includes Heart  attack in his father; Kidney disease in his father.   Allergies Allergies  Allergen Reactions   Enoxaparin  Itching    Per pt it makes my eyes steam   Nsaids Other (See Comments)    Kidney disease     Home Medications  Prior to Admission medications   Medication Sig Start Date End Date Taking? Authorizing Provider  alendronate  (FOSAMAX ) 70 MG tablet Take 70 mg by mouth once a week. Take with a full glass of water on an empty stomach.   Yes [provider]  atorvastatin  (LIPITOR ) 80 MG tablet Take 80 mg by mouth at  bedtime. 08/23/20  Yes [provider]  divalproex  (DEPAKOTE ) 250 MG DR tablet Take 3 tablets (750 mg total) by mouth every 12 (twelve) hours. Patient taking differently: Take 750 mg by mouth 2 (two) times daily. 11/18/15  Yes Vachhani, Vaibhavkumar, MD  levETIRAcetam  (KEPPRA ) 500 MG tablet Take 1 tablet (500 mg total) by mouth 2 (two) times daily. 10/01/20 08/27/24 Yes Awanda City, MD  lisinopril  (ZESTRIL ) 5 MG tablet Hold this medicine until followup with outpatient doctor because your blood pressure was normal in the hospital without it. 10/01/20  Yes Awanda City, MD  propranolol  (INDERAL ) 20 MG tablet Hold this medicine until followup with outpatient doctor because your heart rate was already low in the hospital without it. Patient taking differently: Take 20 mg by mouth 2 (two) times daily. Hold this medicine until followup with outpatient doctor because your heart rate was already low in the hospital without it. 10/01/20  Yes Awanda City, MD  tamsulosin  (FLOMAX ) 0.4 MG CAPS capsule Take 1 capsule (0.4 mg total) by mouth at bedtime. 10/01/20  Yes Awanda City, MD  feeding supplement, ENSURE ENLIVE, (ENSURE ENLIVE) LIQD Take 237 mLs by mouth 3 (three) times daily between meals. 09/15/16   Hower, Alm POUR, MD     Critical care provider statement:   Total critical care time: 33 minutes   Performed by: Parris MD   Critical care time was exclusive of separately billable procedures and treating other patients.   Critical care was necessary to treat or prevent imminent or life-threatening deterioration.   Critical care was time spent personally by me on the following activities: development of treatment plan with patient and/or surrogate as well as nursing, discussions with consultants, evaluation of patient's response to treatment, examination of patient, obtaining history from patient or surrogate, ordering and performing treatments and interventions, ordering and review of laboratory studies,  ordering and review of radiographic studies, pulse oximetry and re-evaluation of patient's condition.    Grady Mohabir, M.D.  Pulmonary & Critical Care Medicine

## 2024-08-30 NOTE — Plan of Care (Signed)
  Problem: Education: Goal: Knowledge of General Education information will improve Description: Including pain rating scale, medication(s)/side effects and non-pharmacologic comfort measures Outcome: Progressing   Problem: Clinical Measurements: Goal: Ability to maintain clinical measurements within normal limits will improve Outcome: Progressing Goal: Will remain free from infection Outcome: Progressing Goal: Diagnostic test results will improve Outcome: Progressing Goal: Respiratory complications will improve Outcome: Progressing Goal: Cardiovascular complication will be avoided Outcome: Progressing   Problem: Activity: Goal: Risk for activity intolerance will decrease Outcome: Progressing   Problem: Nutrition: Goal: Adequate nutrition will be maintained Outcome: Progressing   Problem: Elimination: Goal: Will not experience complications related to bowel motility Outcome: Progressing Goal: Will not experience complications related to urinary retention Outcome: Progressing   Problem: Pain Managment: Goal: General experience of comfort will improve and/or be controlled Outcome: Progressing   Problem: Safety: Goal: Ability to remain free from injury will improve Outcome: Progressing

## 2024-08-30 NOTE — Consult Note (Signed)
 Chief Complaint:  Thrombocytopenia  Procedure: Bone Marrow Biopsy with Aspiration   Referring Provider(s): Tinnie Dawn, NP  Supervising Physician: Vanice Revel  Patient Status: [facility] - In-pt  History of Present Illness: [name] is a 63 y.o. male with a history of anemia, seizures,  HTN, Afib not on AC, and CKD stage II who presented to the ED on 9/10 with complaints of chest pain and shortness of breath. Per EMS when they arrived, patient was cold and diaphoretic, hypotensive w/ systolic in the 70s, Afib RVR to the 150s. Given IV diltiazem  and fluids and placed on 4L Minor helping to stabilize. Initial workup revealed AKI with BUN/Cr of 42/2.02 and severe anemia and thrombocytopenia with H/H of 8.5/25.5 and platelets of 114. Hematology consulted and additional labs revealed unremarkable iron studies, B12, or TSH. Folate levels slightly decreased, so started on supplements. IR consulted for bone marrow biopsy to rule out other etiologies of anemia.   Patient is resting in bed in no acute distress. States that for about 3 weeks prior to collapsing he had been feeling very fatigued and weak. Also admits to shortness of breath and intermittent palpitations. Denies any dizziness, chest pain, hematemesis, or melena. All questions and concerns answered at the bedside.   Patient is Full Code  Past Medical History:  Diagnosis Date   Anemia    GERD (gastroesophageal reflux disease)    Hypertension    Hyponatremia    chronic   Kidney disease    Migraine    Seizures (HCC)     Past Surgical History:  Procedure Laterality Date   COLONOSCOPY WITH PROPOFOL  N/A 05/01/2016   Procedure: COLONOSCOPY WITH PROPOFOL ;  Surgeon: Rogelia Copping, MD;  Location: [facility] ENDOSCOPY;  Service: Endoscopy;  Laterality: N/A;   COLONOSCOPY WITH PROPOFOL  N/A 06/26/2022   Procedure: COLONOSCOPY WITH PROPOFOL ;  Surgeon: Copping Rogelia, MD;  Location: [facility] ENDOSCOPY;  Service: Endoscopy;  Laterality: N/A;    ESOPHAGOGASTRODUODENOSCOPY (EGD) WITH PROPOFOL  N/A 05/01/2016   Procedure: ESOPHAGOGASTRODUODENOSCOPY (EGD) WITH PROPOFOL ;  Surgeon: Rogelia Copping, MD;  Location: [facility] ENDOSCOPY;  Service: Endoscopy;  Laterality: N/A;   none      Allergies: Enoxaparin  and Nsaids  Medications: Prior to Admission medications   Medication Sig Start Date End Date Taking? Authorizing Provider  alendronate  (FOSAMAX ) 70 MG tablet Take 70 mg by mouth once a week. Take with a full glass of water on an empty stomach.   Yes [provider]  atorvastatin  (LIPITOR ) 80 MG tablet Take 80 mg by mouth at bedtime. 08/23/20  Yes [provider]  divalproex  (DEPAKOTE ) 250 MG DR tablet Take 3 tablets (750 mg total) by mouth every 12 (twelve) hours. Patient taking differently: Take 750 mg by mouth 2 (two) times daily. 11/18/15  Yes Vachhani, Vaibhavkumar, MD  levETIRAcetam  (KEPPRA ) 500 MG tablet Take 1 tablet (500 mg total) by mouth 2 (two) times daily. 10/01/20 08/27/24 Yes Awanda City, MD  lisinopril  (ZESTRIL ) 5 MG tablet Hold this medicine until followup with outpatient doctor because your blood pressure was normal in the hospital without it. 10/01/20  Yes Awanda City, MD  propranolol  (INDERAL ) 20 MG tablet Hold this medicine until followup with outpatient doctor because your heart rate was already low in the hospital without it. Patient taking differently: Take 20 mg by mouth 2 (two) times daily. Hold this medicine until followup with outpatient doctor because your heart rate was already low in the hospital without it. 10/01/20  Yes Awanda City, MD  tamsulosin  (  FLOMAX ) 0.4 MG CAPS capsule Take 1 capsule (0.4 mg total) by mouth at bedtime. 10/01/20  Yes Awanda City, MD  feeding supplement, ENSURE ENLIVE, (ENSURE ENLIVE) LIQD Take 237 mLs by mouth 3 (three) times daily between meals. 09/15/16   Hower, Alm POUR, MD     Family History  Problem Relation Age of Onset   Heart attack Father    Kidney disease Father     Social  History   Socioeconomic History   Marital status: Single    Spouse name: Not on file   Number of children: Not on file   Years of education: Not on file   Highest education level: Not on file  Occupational History   Not on file  Tobacco Use   Smoking status: Never   Smokeless tobacco: Never  Vaping Use   Vaping status: Never Used  Substance and Sexual Activity   Alcohol use: No   Drug use: No   Sexual activity: Not on file  Other Topics Concern   Not on file  Social History Narrative   Cross dresses    Social Drivers of Health   Financial Resource Strain: Low Risk  (07/10/2023)   Received from [facility]   Overall Financial Resource Strain (CARDIA)    Difficulty of Paying Living Expenses: Not hard at all  Food Insecurity: No Food Insecurity (07/10/2023)   Received from [facility]   Hunger Vital Sign    Within the past 12 months, you worried that your food would run out before you got the money to buy more.: Never true    Within the past 12 months, the food you bought just didn't last and you didn't have money to get more.: Never true  Transportation Needs: No Transportation Needs (07/10/2023)   Received from [facility] - Transportation    In the past 12 months, has lack of transportation kept you from medical appointments or from getting medications?: No    Lack of Transportation (Non-Medical): No  Physical Activity: Not on file  Stress: Not on file  Social Connections: Not on file     Review of Systems  Constitutional:  Positive for fatigue.  Respiratory:  Positive for shortness of breath.   Cardiovascular:  Positive for palpitations.  Neurological:  Positive for weakness.  Patient denies any headache, chest pain, abdominal pain, N/V, or fever/chills. All other systems are negative.   Vital Signs: BP (!) 93/55   Pulse (!) 58   Temp 97.9 F (36.6 C) (Oral)   Resp 16   Ht 5' 3 (1.6 m)   Wt 131  lb 6.3 oz (59.6 kg)   SpO2 100%   BMI 23.28 kg/m    Physical Exam Constitutional:      Appearance: He is well-developed.  HENT:     Mouth/Throat:     Mouth: Mucous membranes are moist.     Pharynx: Oropharynx is clear.  Cardiovascular:     Rate and Rhythm: Normal rate and regular rhythm.     Heart sounds: Normal heart sounds.  Pulmonary:     Effort: Pulmonary effort is normal.     Breath sounds: Normal breath sounds.  Abdominal:     General: Abdomen is flat.     Palpations: Abdomen is soft.     Tenderness: There is no abdominal tenderness.  Skin:    General: Skin is warm and dry.  Neurological:     Mental Status: He is  alert and oriented to person, place, and time.     Comments: Bilateral essential tremors of upper extremities  Psychiatric:        Behavior: Behavior normal.     Imaging: ECHOCARDIOGRAM COMPLETE Result Date: 08/27/2024    ECHOCARDIOGRAM REPORT   Patient Name:   KENDARIUS VIGEN Date of Exam: 08/27/2024 Medical Rec #:  [id number]        Height:       63.0 in Accession #:    [id number]       Weight:       130.7 lb Date of Birth:  08-11-1961        BSA:          1.614 m Patient Age:    63 years         BP:           88/63 mmHg Patient Gender: M                HR:           49 bpm. Exam Location:  [facility] Procedure: 2D Echo, Cardiac Doppler, Color Doppler and Intracardiac            Opacification Agent (Both Spectral and Color Flow Doppler were            utilized during procedure). Indications:     Atrial Flutter I48.92  History:         Patient has prior history of Echocardiogram examinations, most                  recent 09/17/2020. Arrythmias:Atrial Flutter.  Sonographer:     Ashley McNeely-Sloane Referring Phys:  [id number] DELAYNE LULLA SOLIAN Diagnosing Phys: Marsa Dooms MD IMPRESSIONS  1. Left ventricular ejection fraction, by estimation, is 60 to 65%. The left ventricle has normal function. The left ventricle has no regional wall motion abnormalities. Left ventricular  diastolic parameters are indeterminate.  2. Right ventricular systolic function is normal. The right ventricular size is normal.  3. The mitral valve is normal in structure. Trivial mitral valve regurgitation. No evidence of mitral stenosis.  4. The aortic valve is normal in structure. Aortic valve regurgitation is not visualized. No aortic stenosis is present.  5. The inferior vena cava is normal in size with greater than 50% respiratory variability, suggesting right atrial pressure of 3 mmHg. FINDINGS  Left Ventricle: Left ventricular ejection fraction, by estimation, is 60 to 65%. The left ventricle has normal function. The left ventricle has no regional wall motion abnormalities. Definity  contrast agent was given IV to delineate the left ventricular  endocardial borders. Strain was performed and the global longitudinal strain is indeterminate. The left ventricular internal cavity size was normal in size. There is no left ventricular hypertrophy. Left ventricular diastolic parameters are indeterminate. Right Ventricle: The right ventricular size is normal. No increase in right ventricular wall thickness. Right ventricular systolic function is normal. Left Atrium: Left atrial size was normal in size. Right Atrium: Right atrial size was normal in size. Pericardium: There is no evidence of pericardial effusion. Mitral Valve: The mitral valve is normal in structure. Trivial mitral valve regurgitation. No evidence of mitral valve stenosis. MV peak gradient, 6.2 mmHg. The mean mitral valve gradient is 1.0 mmHg. Tricuspid Valve: The tricuspid valve is normal in structure. Tricuspid valve regurgitation is trivial. No evidence of tricuspid stenosis. Aortic Valve: The aortic valve is normal in structure. Aortic valve regurgitation is not visualized. No aortic stenosis is  present. Aortic valve mean gradient measures 2.0 mmHg. Aortic valve peak gradient measures 4.8 mmHg. Aortic valve area, by VTI measures 2.39 cm. Pulmonic  Valve: The pulmonic valve was normal in structure. Pulmonic valve regurgitation is not visualized. No evidence of pulmonic stenosis. Aorta: The aortic root is normal in size and structure. Venous: The inferior vena cava is normal in size with greater than 50% respiratory variability, suggesting right atrial pressure of 3 mmHg. IAS/Shunts: No atrial level shunt detected by color flow Doppler. Additional Comments: 3D was performed not requiring image post processing on an independent workstation and was indeterminate.  LEFT VENTRICLE PLAX 2D LVIDd:         3.60 cm     Diastology LVIDs:         2.20 cm     LV e' medial:    9.99 cm/s LV PW:         0.90 cm     LV E/e' medial:  11.2 LV IVS:        0.80 cm     LV e' lateral:   14.60 cm/s LVOT diam:     1.90 cm     LV E/e' lateral: 7.7 LV SV:         65 LV SV Index:   40 LVOT Area:     2.84 cm  LV Volumes (MOD) LV vol d, MOD A2C: 61.6 ml LV vol d, MOD A4C: 60.7 ml LV vol s, MOD A2C: 25.7 ml LV vol s, MOD A4C: 26.1 ml LV SV MOD A2C:     35.9 ml LV SV MOD A4C:     60.7 ml LV SV MOD BP:      34.9 ml RIGHT VENTRICLE            IVC RV Basal diam:  3.50 cm    IVC diam: 2.30 cm RV Mid diam:    2.60 cm RV S prime:     9.46 cm/s TAPSE (M-mode): 2.3 cm LEFT ATRIUM             Index        RIGHT ATRIUM           Index LA diam:        3.20 cm 1.98 cm/m   RA Area:     12.50 cm LA Vol (A2C):   27.4 ml 16.98 ml/m  RA Volume:   25.10 ml  15.55 ml/m LA Vol (A4C):   43.6 ml 27.01 ml/m LA Biplane Vol: 34.2 ml 21.19 ml/m  AORTIC VALVE                    PULMONIC VALVE AV Area (Vmax):    2.63 cm     PV Vmax:        0.70 m/s AV Area (Vmean):   2.50 cm     PV Vmean:       46.800 cm/s AV Area (VTI):     2.39 cm     PV VTI:         0.156 m AV Vmax:           109.00 cm/s  PV Peak grad:   1.9 mmHg AV Vmean:          71.400 cm/s  PV Mean grad:   1.0 mmHg AV VTI:            0.273 m      RVOT Peak grad: 1 mmHg AV Peak Grad:  4.8 mmHg AV Mean Grad:      2.0 mmHg LVOT Vmax:         101.00  cm/s LVOT Vmean:        62.900 cm/s LVOT VTI:          0.230 m LVOT/AV VTI ratio: 0.84  AORTA Ao Root diam: 3.40 cm MITRAL VALVE                TRICUSPID VALVE MV Area (PHT): 4.80 cm     TR Peak grad:   27.2 mmHg MV Area VTI:   1.97 cm     TR Vmax:        261.00 cm/s MV Peak grad:  6.2 mmHg MV Mean grad:  1.0 mmHg     SHUNTS MV Vmax:       1.25 m/s     Systemic VTI:  0.23 m MV Vmean:      45.9 cm/s    Systemic Diam: 1.90 cm MV Decel Time: 158 msec     Pulmonic VTI:  0.122 m MV E velocity: 112.00 cm/s MV A velocity: 44.50 cm/s MV E/A ratio:  2.52 Marsa Dooms MD Electronically signed by Marsa Dooms MD Signature Date/Time: 08/27/2024/1:26:42 PM    Final    CT Angio Chest PE W/Cm &/Or Wo Cm Result Date: 08/26/2024 EXAM: CTA of the Chest with contrast for PE 08/26/2024 09:05:01 PM TECHNIQUE: CTA of the chest was performed after the administration of intravenous contrast. Multiplanar reformatted images are provided for review. MIP images are provided for review. Automated exposure control, iterative reconstruction, and/or weight based adjustment of the mA/kV was utilized to reduce the radiation dose to as low as reasonably achievable. COMPARISON: 09/23/2020 CLINICAL HISTORY: Pulmonary embolism (PE) suspected, high probability. Patient found on the floor by EMS with chest pain, atrial fibrillation with rapid ventricular response, hypotension, and hypoxia. Initial blood pressure was 74/48. FINDINGS: PULMONARY ARTERIES: Pulmonary arteries are adequately opacified for evaluation. No pulmonary embolism. Main pulmonary artery is normal in caliber. MEDIASTINUM: The heart and pericardium demonstrate no acute abnormality. No pericardial effusion. There is no acute abnormality of the thoracic aorta. Coronary artery and aortic atherosclerotic calcification. LYMPH NODES: No mediastinal, hilar or axillary lymphadenopathy. LUNGS AND PLEURA: The lungs are without acute process. No focal consolidation or pulmonary  edema. No pleural effusion or pneumothorax. UPPER ABDOMEN: Limited images of the upper abdomen are unremarkable. SOFT TISSUES AND BONES: No acute bone or soft tissue abnormality. IMPRESSION: 1. No pulmonary embolism. 2. No acute pulmonary abnormality. Electronically signed by: Norman Gatlin MD 08/26/2024 09:10 PM EDT RP Workstation: [id number]   DG Chest 1 View Result Date: 08/26/2024 CLINICAL DATA:  Shortness of breath. EXAM: CHEST  1 VIEW COMPARISON:  Chest radiograph dated 12/17/2021. FINDINGS: The heart size and mediastinal contours are within normal limits. Both lungs are clear. The visualized skeletal structures are unremarkable. IMPRESSION: No active disease. Electronically Signed   By: Vanetta Chou M.D.   On: 08/26/2024 19:59    Labs:  CBC: Recent Labs    08/27/24 1005 08/27/24 1223 08/28/24 0502 08/28/24 0503 08/28/24 1231 08/29/24 0236 08/30/24 0323  WBC 5.2  --  7.5  --   --  6.3 6.0  HGB 7.7*   < > 7.0* 6.9* 8.9* 8.6* 9.0*  HCT 22.5*   < > 20.2* 20.6* 26.8* 25.1* 26.3*  PLT 86*  --  77*  --   --  89* 109*   < > = values in this interval not  displayed.    COAGS: Recent Labs    08/26/24 1926  INR 1.0    BMP: Recent Labs    08/28/24 0502 08/29/24 0236 08/30/24 0323 08/30/24 1439  NA 140 136 137 137  K 3.4* 4.4 5.7* 5.2*  CL 109 106 104 105  CO2 24 24 24 24   GLUCOSE 105* 100* 85 100*  BUN 36* 31* 29* 32*  CALCIUM  8.3* 8.6* 8.9 9.0  CREATININE 1.80* 1.53* 1.78* 2.06*  GFRNONAA 41* 50* 42* 35*    LIVER FUNCTION TESTS: Recent Labs    08/26/24 1926 08/28/24 0502 08/29/24 0236 08/30/24 0323  BILITOT 0.8  --   --   --   AST 58*  --   --   --   ALT 17  --   --   --   ALKPHOS 32*  --   --   --   PROT 5.8*  --   --   --   ALBUMIN 3.1* 2.4* 2.3* 2.4*    TUMOR MARKERS: No results for input(s): AFPTM, CEA, CA199, CHROMGRNA in the last 8760 hours.  Assessment and Plan:  Anemia, Thrombocytopenia:  [name] is a 63 y.o. male with a  history of anemia, seizures,  HTN, Afib not on AC, and CKD stage II who presented to the ED on 9/10 with complaints of chest pain and shortness of breath. Workup revealed severe anemia and thrombocytopenia. IR consulted for bone marrow biopsy with aspiration for further diagnostic purposes. Procedure to be performed under moderate sedation.   -NPO since midnight -Potassium 6.1 this morning despite IV calcium  gluconate -Plan for bone marrow biopsy with aspiration  Risks and benefits of bone marrow biopsy was discussed with the patient and/or patient's family including, but not limited to bleeding, infection, damage to adjacent structures or low yield requiring additional tests.  All of the questions were answered and there is agreement to proceed.  Consent signed and in chart.   Thank you for allowing our service to participate in [name] 's care.    Electronically Signed: Ingrid Shifrin M Heyden Jaber, PA-C   08/30/2024, 4:21 PM     I spent a total of 20 Minutes in face to face in clinical consultation, greater than 50% of which was counseling/coordinating care for bone marrow biopsy with aspiration.

## 2024-08-30 NOTE — Progress Notes (Signed)
 Per provider patient can be made step down status.

## 2024-08-31 ENCOUNTER — Inpatient Hospital Stay: Admitting: Radiology

## 2024-08-31 ENCOUNTER — Other Ambulatory Visit: Payer: Self-pay

## 2024-08-31 DIAGNOSIS — D631 Anemia in chronic kidney disease: Secondary | ICD-10-CM

## 2024-08-31 DIAGNOSIS — D649 Anemia, unspecified: Secondary | ICD-10-CM | POA: Diagnosis not present

## 2024-08-31 DIAGNOSIS — I259 Chronic ischemic heart disease, unspecified: Secondary | ICD-10-CM | POA: Diagnosis not present

## 2024-08-31 DIAGNOSIS — I480 Paroxysmal atrial fibrillation: Secondary | ICD-10-CM | POA: Diagnosis not present

## 2024-08-31 DIAGNOSIS — I10 Essential (primary) hypertension: Secondary | ICD-10-CM | POA: Diagnosis not present

## 2024-08-31 DIAGNOSIS — J9601 Acute respiratory failure with hypoxia: Secondary | ICD-10-CM

## 2024-08-31 DIAGNOSIS — R579 Shock, unspecified: Secondary | ICD-10-CM

## 2024-08-31 DIAGNOSIS — I4891 Unspecified atrial fibrillation: Secondary | ICD-10-CM | POA: Diagnosis not present

## 2024-08-31 DIAGNOSIS — I4892 Unspecified atrial flutter: Secondary | ICD-10-CM | POA: Diagnosis not present

## 2024-08-31 DIAGNOSIS — E875 Hyperkalemia: Secondary | ICD-10-CM

## 2024-08-31 DIAGNOSIS — N1832 Chronic kidney disease, stage 3b: Secondary | ICD-10-CM | POA: Diagnosis not present

## 2024-08-31 DIAGNOSIS — G250 Essential tremor: Secondary | ICD-10-CM | POA: Diagnosis not present

## 2024-08-31 DIAGNOSIS — N179 Acute kidney failure, unspecified: Secondary | ICD-10-CM | POA: Diagnosis not present

## 2024-08-31 DIAGNOSIS — D696 Thrombocytopenia, unspecified: Secondary | ICD-10-CM | POA: Diagnosis not present

## 2024-08-31 DIAGNOSIS — D6489 Other specified anemias: Secondary | ICD-10-CM | POA: Diagnosis not present

## 2024-08-31 DIAGNOSIS — G40909 Epilepsy, unspecified, not intractable, without status epilepticus: Secondary | ICD-10-CM

## 2024-08-31 DIAGNOSIS — E785 Hyperlipidemia, unspecified: Secondary | ICD-10-CM | POA: Diagnosis not present

## 2024-08-31 DIAGNOSIS — I2489 Other forms of acute ischemic heart disease: Secondary | ICD-10-CM | POA: Diagnosis not present

## 2024-08-31 HISTORY — PX: IR BONE MARROW BIOPSY & ASPIRATION: IMG5727

## 2024-08-31 LAB — BASIC METABOLIC PANEL WITH GFR
Anion gap: 6 (ref 5–15)
BUN: 32 mg/dL — ABNORMAL HIGH (ref 8–23)
CO2: 26 mmol/L (ref 22–32)
Calcium: 9.1 mg/dL (ref 8.9–10.3)
Chloride: 104 mmol/L (ref 98–111)
Creatinine, Ser: 2 mg/dL — ABNORMAL HIGH (ref 0.61–1.24)
GFR, Estimated: 36 mL/min — ABNORMAL LOW (ref >60)
Glucose, Bld: 90 mg/dL (ref 70–99)
Potassium: 6.1 mmol/L — ABNORMAL HIGH (ref 3.5–5.1)
Sodium: 136 mmol/L (ref 135–145)

## 2024-08-31 LAB — CBC WITH DIFFERENTIAL/PLATELET
Abs Immature Granulocytes: 0.1 K/uL — ABNORMAL HIGH (ref 0.00–0.07)
Basophils Absolute: 0 K/uL (ref 0.0–0.1)
Basophils Relative: 1 %
Eosinophils Absolute: 0.2 K/uL (ref 0.0–0.5)
Eosinophils Relative: 4 %
HCT: 28.2 % — ABNORMAL LOW (ref 39.0–52.0)
Hemoglobin: 9.5 g/dL — ABNORMAL LOW (ref 13.0–17.0)
Immature Granulocytes: 2 %
Lymphocytes Relative: 31 %
Lymphs Abs: 1.9 K/uL (ref 0.7–4.0)
MCH: 32.3 pg (ref 26.0–34.0)
MCHC: 33.7 g/dL (ref 30.0–36.0)
MCV: 95.9 fL (ref 80.0–100.0)
Monocytes Absolute: 1 K/uL (ref 0.1–1.0)
Monocytes Relative: 15 %
Neutro Abs: 3.1 K/uL (ref 1.7–7.7)
Neutrophils Relative %: 47 %
Platelets: 143 K/uL — ABNORMAL LOW (ref 150–400)
RBC: 2.94 MIL/uL — ABNORMAL LOW (ref 4.22–5.81)
RDW: 18.6 % — ABNORMAL HIGH (ref 11.5–15.5)
WBC: 6.4 K/uL (ref 4.0–10.5)
nRBC: 0 % (ref 0.0–0.2)

## 2024-08-31 LAB — BLOOD GAS, VENOUS
Acid-Base Excess: 5.6 mmol/L — ABNORMAL HIGH (ref 0.0–2.0)
Bicarbonate: 30.6 mmol/L — ABNORMAL HIGH (ref 20.0–28.0)
O2 Saturation: 37.4 %
Patient temperature: 37
pCO2, Ven: 45 mmHg (ref 44–60)
pH, Ven: 7.44 — ABNORMAL HIGH (ref 7.25–7.43)

## 2024-08-31 LAB — MAGNESIUM: Magnesium: 1.8 mg/dL (ref 1.7–2.4)

## 2024-08-31 LAB — POTASSIUM: Potassium: 5.1 mmol/L (ref 3.5–5.1)

## 2024-08-31 MED ORDER — INSULIN ASPART 100 UNIT/ML IV SOLN
10.0000 [IU] | Freq: Once | INTRAVENOUS | Status: AC
  Administered 2024-08-31: 10 [IU] via INTRAVENOUS
  Filled 2024-08-31: qty 0.1

## 2024-08-31 MED ORDER — CALCIUM GLUCONATE-NACL 1-0.675 GM/50ML-% IV SOLN
1.0000 g | Freq: Once | INTRAVENOUS | Status: AC
  Administered 2024-08-31: 1000 mg via INTRAVENOUS
  Filled 2024-08-31: qty 50

## 2024-08-31 MED ORDER — DEXTROSE 50 % IV SOLN
50.0000 mL | Freq: Once | INTRAVENOUS | Status: AC
  Administered 2024-08-31: 50 mL via INTRAVENOUS
  Filled 2024-08-31: qty 50

## 2024-08-31 MED ORDER — HEPARIN SOD (PORK) LOCK FLUSH 100 UNIT/ML IV SOLN
500.0000 [IU] | Freq: Once | INTRAVENOUS | Status: AC
  Administered 2024-08-31: 200 [IU] via INTRAVENOUS
  Filled 2024-08-31: qty 5

## 2024-08-31 MED ORDER — PATIROMER SORBITEX CALCIUM 8.4 G PO PACK
8.4000 g | PACK | Freq: Once | ORAL | Status: AC
  Administered 2024-08-31: 8.4 g via ORAL
  Filled 2024-08-31: qty 1

## 2024-08-31 MED ORDER — MIDAZOLAM HCL 5 MG/5ML IJ SOLN
INTRAMUSCULAR | Status: AC | PRN
  Administered 2024-08-31: 1 mg via INTRAVENOUS

## 2024-08-31 MED ORDER — FENTANYL CITRATE (PF) 100 MCG/2ML IJ SOLN
INTRAMUSCULAR | Status: AC
  Filled 2024-08-31: qty 2

## 2024-08-31 MED ORDER — SODIUM BICARBONATE 8.4 % IV SOLN
25.0000 meq | Freq: Once | INTRAVENOUS | Status: AC
  Administered 2024-08-31: 25 meq via INTRAVENOUS
  Filled 2024-08-31: qty 50

## 2024-08-31 MED ORDER — PRIMIDONE 50 MG PO TABS
50.0000 mg | ORAL_TABLET | Freq: Three times a day (TID) | ORAL | Status: DC
  Administered 2024-08-31 – 2024-09-05 (×15): 50 mg via ORAL
  Filled 2024-08-31 (×19): qty 1

## 2024-08-31 MED ORDER — MIDAZOLAM HCL 2 MG/2ML IJ SOLN
INTRAMUSCULAR | Status: AC
  Filled 2024-08-31: qty 2

## 2024-08-31 MED ORDER — FINASTERIDE 5 MG PO TABS
5.0000 mg | ORAL_TABLET | Freq: Every day | ORAL | Status: DC
  Administered 2024-08-31 – 2024-09-05 (×6): 5 mg via ORAL
  Filled 2024-08-31 (×6): qty 1

## 2024-08-31 MED ORDER — LIDOCAINE 1 % OPTIME INJ - NO CHARGE
10.0000 mL | Freq: Once | INTRAMUSCULAR | Status: AC
  Administered 2024-08-31: 10 mL
  Filled 2024-08-31: qty 10

## 2024-08-31 MED ORDER — HEPARIN SOD (PORK) LOCK FLUSH 100 UNIT/ML IV SOLN
INTRAVENOUS | Status: AC
  Filled 2024-08-31: qty 5

## 2024-08-31 MED ORDER — FENTANYL CITRATE (PF) 100 MCG/2ML IJ SOLN
INTRAMUSCULAR | Status: AC | PRN
  Administered 2024-08-31: 50 ug via INTRAVENOUS

## 2024-08-31 MED ORDER — PATIROMER SORBITEX CALCIUM 8.4 G PO PACK
16.8000 g | PACK | Freq: Every day | ORAL | Status: DC
  Administered 2024-08-31 – 2024-09-01 (×2): 16.8 g via ORAL
  Filled 2024-08-31 (×3): qty 2

## 2024-08-31 NOTE — Progress Notes (Signed)
 Patient requesting to lock valuables up in hospital safe. Patient has 1 silver colored watch, 1 yellow colored ring with 1 clear stone and 1 silver colored Ace of Spades ring. Patient valuable envelope [id number] seal in front of patient and security and then given to security to lock up. Security returned canary copy to be placed on paper chart with safe key #[id number]. Patient instructed to ask for valuables at the time of discharge. Patient verbalized understanding.

## 2024-08-31 NOTE — Plan of Care (Signed)
  Problem: Education: Goal: Knowledge of General Education information will improve Description: Including pain rating scale, medication(s)/side effects and non-pharmacologic comfort measures Outcome: Progressing   Problem: Clinical Measurements: Goal: Ability to maintain clinical measurements within normal limits will improve Outcome: Progressing Goal: Will remain free from infection Outcome: Progressing Goal: Diagnostic test results will improve Outcome: Progressing Goal: Respiratory complications will improve Outcome: Progressing Goal: Cardiovascular complication will be avoided Outcome: Progressing   Problem: Activity: Goal: Risk for activity intolerance will decrease Outcome: Progressing   Problem: Nutrition: Goal: Adequate nutrition will be maintained Outcome: Progressing   Problem: Coping: Goal: Level of anxiety will decrease Outcome: Progressing   Problem: Elimination: Goal: Will not experience complications related to bowel motility Outcome: Progressing Goal: Will not experience complications related to urinary retention Outcome: Progressing   Problem: Pain Managment: Goal: General experience of comfort will improve and/or be controlled Outcome: Progressing   Problem: Safety: Goal: Ability to remain free from injury will improve Outcome: Progressing

## 2024-08-31 NOTE — Plan of Care (Signed)
  Problem: Education: Goal: Knowledge of General Education information will improve Description: Including pain rating scale, medication(s)/side effects and non-pharmacologic comfort measures Outcome: Progressing   Problem: Health Behavior/Discharge Planning: Goal: Ability to manage health-related needs will improve Outcome: Progressing   Problem: Clinical Measurements: Goal: Will remain free from infection Outcome: Progressing Goal: Diagnostic test results will improve Outcome: Progressing Goal: Respiratory complications will improve Outcome: Progressing   Problem: Nutrition: Goal: Adequate nutrition will be maintained Outcome: Progressing   Problem: Pain Managment: Goal: General experience of comfort will improve and/or be controlled Outcome: Progressing   Problem: Safety: Goal: Ability to remain free from injury will improve Outcome: Progressing   Problem: Skin Integrity: Goal: Risk for impaired skin integrity will decrease Outcome: Progressing   Problem: Education: Goal: Knowledge of disease or condition will improve Outcome: Progressing

## 2024-08-31 NOTE — TOC Progression Note (Signed)
 Transition of Care [facility]) - Progression Note    Patient Details  Name: GOVANNI PLEMONS MRN: [id number] Date of Birth: 09/08/1961  Transition of [facility] CM/SW Contact  K'La JINNY Ruts, LCSW Phone Number: 08/31/2024, 2:51 PM  Clinical Narrative:    Chart reviewed. I spoke with the patient at bedside today about medication assistance and getting a new cell phone. I informed the patient that per meds to bed that his medications have 0 copays. The patient verbalized understanding. I then informed the patient that he can contact DSS to help with getting a new cell phone. I told the patient that I will add DSS information to his D/C summary and the patient verbalized understanding.   NO other TOC needs at this time.                      Expected Discharge Plan and Services                                               Social Drivers of Health (SDOH) Interventions SDOH Screenings   Food Insecurity: No Food Insecurity (08/31/2024)  Housing: Low Risk  (08/31/2024)  Transportation Needs: No Transportation Needs (08/31/2024)  Utilities: Not At Risk (08/31/2024)  Financial Resource Strain: Low Risk  (07/10/2023)   Received from [facility]  Social Connections: Unknown (08/31/2024)  Tobacco Use: Low Risk  (08/26/2024)    Readmission Risk Interventions     No data to display

## 2024-08-31 NOTE — Assessment & Plan Note (Signed)
 Secondary to rapid atrial fibrillation and propranolol .  Off Neo-Synephrine.  On midodrine .  No signs of bleeding.

## 2024-08-31 NOTE — Progress Notes (Signed)
 [facility] CLINIC CARDIOLOGY PROGRESS NOTE       Patient ID: [name] MRN: [id number] DOB/AGE: 65-09-1961 65 y.o.  Admit date: 08/26/2024 Referring Physician Dr. Parris Primary Physician Glover Lenis, MD Primary Cardiologist Dr. Ammon Reason for Consultation AFL RVR  HPI: [name] is a 63 y.o. male  with a past medical history of paroxysmal atrial fibrillation, hypertension, hyperlipidemia, CKD who presented to the ED on 08/26/2024 for intermittent palpitations a/w SOB for the past few days.  On arrival to ED patient found to be in atrial fibrillation RVR  rate 140s. IV Cardizem  initiated, led to hypotension requiring pressors. Cardiology was consulted for AF RVR.  Interval History: -Patient seen and examined this AM, sitting upright in hospital bed. Denies any chest pain/pressure, SOB, lightheadedness/dizziness, palpitations -Patients BP borderline low but stable and HR  stable this AM. Per tele remains SR, rate 50-60s.  -Patient remains on room air with stable SpO2.    Pertinent Cardiac History (Most recent) Stress test (09/29/2021) Regional wall motion:  reveals normal myocardial thickening and wall  motion.  The overall quality of the study is good.   Artifacts noted: no  Left ventricular cavity: normal.   Review of systems complete and found to be negative unless listed above    Past Medical History:  Diagnosis Date   Anemia    GERD (gastroesophageal reflux disease)    Hypertension    Hyponatremia    chronic   Kidney disease    Migraine    Seizures (HCC)     Past Surgical History:  Procedure Laterality Date   COLONOSCOPY WITH PROPOFOL  N/A 05/01/2016   Procedure: COLONOSCOPY WITH PROPOFOL ;  Surgeon: Rogelia Copping, MD;  Location: [facility] ENDOSCOPY;  Service: Endoscopy;  Laterality: N/A;   COLONOSCOPY WITH PROPOFOL  N/A 06/26/2022   Procedure: COLONOSCOPY WITH PROPOFOL ;  Surgeon: Copping Rogelia, MD;  Location: [facility] ENDOSCOPY;  Service: Endoscopy;   Laterality: N/A;   ESOPHAGOGASTRODUODENOSCOPY (EGD) WITH PROPOFOL  N/A 05/01/2016   Procedure: ESOPHAGOGASTRODUODENOSCOPY (EGD) WITH PROPOFOL ;  Surgeon: Rogelia Copping, MD;  Location: [facility] ENDOSCOPY;  Service: Endoscopy;  Laterality: N/A;   none      Medications Prior to Admission  Medication Sig Dispense Refill Last Dose/Taking   alendronate  (FOSAMAX ) 70 MG tablet Take 70 mg by mouth once a week. Take with a full glass of water on an empty stomach.   08/21/2024   atorvastatin  (LIPITOR ) 80 MG tablet Take 80 mg by mouth at bedtime.   08/25/2024   divalproex  (DEPAKOTE ) 250 MG DR tablet Take 3 tablets (750 mg total) by mouth every 12 (twelve) hours. (Patient taking differently: Take 750 mg by mouth 2 (two) times daily.) 100 tablet 1 08/26/2024 Morning   levETIRAcetam  (KEPPRA ) 500 MG tablet Take 1 tablet (500 mg total) by mouth 2 (two) times daily. 60 tablet 2 08/26/2024 Morning   lisinopril  (ZESTRIL ) 5 MG tablet Hold this medicine until followup with outpatient doctor because your blood pressure was normal in the hospital without it.   08/26/2024 Morning   propranolol  (INDERAL ) 20 MG tablet Hold this medicine until followup with outpatient doctor because your heart rate was already low in the hospital without it. (Patient taking differently: Take 20 mg by mouth 2 (two) times daily. Hold this medicine until followup with outpatient doctor because your heart rate was already low in the hospital without it.)   08/26/2024 Morning   tamsulosin  (FLOMAX ) 0.4 MG CAPS capsule Take 1 capsule (0.4 mg total) by mouth at bedtime.   08/25/2024  feeding supplement, ENSURE ENLIVE, (ENSURE ENLIVE) LIQD Take 237 mLs by mouth 3 (three) times daily between meals. 237 mL 12    Social History   Socioeconomic History   Marital status: Single    Spouse name: Not on file   Number of children: Not on file   Years of education: Not on file   Highest education level: Not on file  Occupational History   Not on file  Tobacco Use    Smoking status: Never   Smokeless tobacco: Never  Vaping Use   Vaping status: Never Used  Substance and Sexual Activity   Alcohol use: No   Drug use: No   Sexual activity: Not on file  Other Topics Concern   Not on file  Social History Narrative   Cross dresses    Social Drivers of Health   Financial Resource Strain: Low Risk  (07/10/2023)   Received from [facility]   Overall Financial Resource Strain (CARDIA)    Difficulty of Paying Living Expenses: Not hard at all  Food Insecurity: No Food Insecurity (08/31/2024)   Hunger Vital Sign    Worried About Running Out of Food in the Last Year: Never true    Ran Out of Food in the Last Year: Never true  Transportation Needs: No Transportation Needs (08/31/2024)   PRAPARE - Administrator, Civil Service (Medical): No    Lack of Transportation (Non-Medical): No  Physical Activity: Not on file  Stress: Not on file  Social Connections: Unknown (08/31/2024)   Social Connection and Isolation Panel    Frequency of Communication with Friends and Family: More than three times a week    Frequency of Social Gatherings with Friends and Family: More than three times a week    Attends Religious Services: More than 4 times per year    Active Member of Clubs or Organizations: Yes    Attends Banker Meetings: More than 4 times per year    Marital Status: Patient declined  Intimate Partner Violence: Not At Risk (08/31/2024)   Humiliation, Afraid, Rape, and Kick questionnaire    Fear of Current or Ex-Partner: No    Emotionally Abused: No    Physically Abused: No    Sexually Abused: No    Family History  Problem Relation Age of Onset   Heart attack Father    Kidney disease Father      Vitals:   08/31/24 1034 08/31/24 1036 08/31/24 1039 08/31/24 1039  BP: 112/79 112/79 96/68 96/68   Pulse: 71 72 74 74  Resp: 11 12 17 20   Temp:      TempSrc:      SpO2: 99% 99% 99% 100%  Weight:      Height:         PHYSICAL EXAM General: Chronically ill appearing male, well nourished, in no acute distress. HEENT: Normocephalic and atraumatic. Neck: No JVD.   Lungs: Normal respiratory effort on room air. Clear bilaterally to auscultation. No wheezes, crackles, rhonchi.  Heart: HRRR. Normal S1 and S2 without gallops or murmurs.  Abdomen: Non-distended appearing.  Msk: Normal strength and tone for age. Extremities: Warm and well perfused. No clubbing, cyanosis, edema.  Neuro: Alert and oriented X 3. Psych: Answers questions appropriately.   Labs: Basic Metabolic Panel: Recent Labs    08/29/24 0236 08/30/24 0323 08/30/24 1439 08/30/24 1936 08/31/24 0359 08/31/24 0416  NA 136 137   < > 135  --  136  K 4.4 5.7*   < >  5.5*  --  6.1*  CL 106 104   < > 104  --  104  CO2 24 24   < > 23  --  26  GLUCOSE 100* 85   < > 89  --  90  BUN 31* 29*   < > 32*  --  32*  CREATININE 1.53* 1.78*   < > 1.97*  --  2.00*  CALCIUM  8.6* 8.9   < > 9.0  --  9.1  MG 2.1 1.7  --   --  1.8  --   PHOS 2.6 2.7  --   --   --   --    < > = values in this interval not displayed.   Liver Function Tests: Recent Labs    08/29/24 0236 08/30/24 0323  ALBUMIN 2.3* 2.4*   No results for input(s): LIPASE, AMYLASE in the last 72 hours. CBC: Recent Labs    08/30/24 0323 08/31/24 0359  WBC 6.0 6.4  NEUTROABS  --  3.1  HGB 9.0* 9.5*  HCT 26.3* 28.2*  MCV 94.6 95.9  PLT 109* 143*   Cardiac Enzymes: No results for input(s): CKTOTAL, CKMB, CKMBINDEX, TROPONINIHS in the last 72 hours.  BNP: No results for input(s): BNP in the last 72 hours.  D-Dimer: No results for input(s): DDIMER in the last 72 hours. Hemoglobin A1C: No results for input(s): HGBA1C in the last 72 hours. Fasting Lipid Panel: No results for input(s): CHOL, HDL, LDLCALC, TRIG, CHOLHDL, LDLDIRECT in the last 72 hours. Thyroid  Function Tests: No results for input(s): TSH, T4TOTAL, T3FREE, THYROIDAB in the  last 72 hours.  Invalid input(s): FREET3  Anemia Panel: No results for input(s): VITAMINB12, FOLATE, FERRITIN, TIBC, IRON, RETICCTPCT in the last 72 hours.    Radiology: ECHOCARDIOGRAM COMPLETE Result Date: 08/27/2024    ECHOCARDIOGRAM REPORT   Patient Name:   [name] Date of Exam: 08/27/2024 Medical Rec #:  [id number]        Height:       63.0 in Accession #:    [id number]       Weight:       130.7 lb Date of Birth:  [birth year]        BSA:          1.614 m Patient Age:    63 years         BP:           88/63 mmHg Patient Gender: M                HR:           49 bpm. Exam Location:  [facility] Procedure: 2D Echo, Cardiac Doppler, Color Doppler and Intracardiac            Opacification Agent (Both Spectral and Color Flow Doppler were            utilized during procedure). Indications:     Atrial Flutter I48.92  History:         Patient has prior history of Echocardiogram examinations, most                  recent 09/17/2020. Arrythmias:Atrial Flutter.  Sonographer:     Ashley McNeely-Sloane Referring Phys:  [id number] DELAYNE LULLA SOLIAN Diagnosing Phys: Marsa Dooms MD IMPRESSIONS  1. Left ventricular ejection fraction, by estimation, is 60 to 65%. The left ventricle has normal function. The left ventricle has no regional wall motion abnormalities. Left ventricular diastolic parameters are  indeterminate.  2. Right ventricular systolic function is normal. The right ventricular size is normal.  3. The mitral valve is normal in structure. Trivial mitral valve regurgitation. No evidence of mitral stenosis.  4. The aortic valve is normal in structure. Aortic valve regurgitation is not visualized. No aortic stenosis is present.  5. The inferior vena cava is normal in size with greater than 50% respiratory variability, suggesting right atrial pressure of 3 mmHg. FINDINGS  Left Ventricle: Left ventricular ejection fraction, by estimation, is 60 to 65%. The left ventricle has normal function. The left  ventricle has no regional wall motion abnormalities. Definity  contrast agent was given IV to delineate the left ventricular  endocardial borders. Strain was performed and the global longitudinal strain is indeterminate. The left ventricular internal cavity size was normal in size. There is no left ventricular hypertrophy. Left ventricular diastolic parameters are indeterminate. Right Ventricle: The right ventricular size is normal. No increase in right ventricular wall thickness. Right ventricular systolic function is normal. Left Atrium: Left atrial size was normal in size. Right Atrium: Right atrial size was normal in size. Pericardium: There is no evidence of pericardial effusion. Mitral Valve: The mitral valve is normal in structure. Trivial mitral valve regurgitation. No evidence of mitral valve stenosis. MV peak gradient, 6.2 mmHg. The mean mitral valve gradient is 1.0 mmHg. Tricuspid Valve: The tricuspid valve is normal in structure. Tricuspid valve regurgitation is trivial. No evidence of tricuspid stenosis. Aortic Valve: The aortic valve is normal in structure. Aortic valve regurgitation is not visualized. No aortic stenosis is present. Aortic valve mean gradient measures 2.0 mmHg. Aortic valve peak gradient measures 4.8 mmHg. Aortic valve area, by VTI measures 2.39 cm. Pulmonic Valve: The pulmonic valve was normal in structure. Pulmonic valve regurgitation is not visualized. No evidence of pulmonic stenosis. Aorta: The aortic root is normal in size and structure. Venous: The inferior vena cava is normal in size with greater than 50% respiratory variability, suggesting right atrial pressure of 3 mmHg. IAS/Shunts: No atrial level shunt detected by color flow Doppler. Additional Comments: 3D was performed not requiring image post processing on an independent workstation and was indeterminate.  LEFT VENTRICLE PLAX 2D LVIDd:         3.60 cm     Diastology LVIDs:         2.20 cm     LV e' medial:    9.99 cm/s  LV PW:         0.90 cm     LV E/e' medial:  11.2 LV IVS:        0.80 cm     LV e' lateral:   14.60 cm/s LVOT diam:     1.90 cm     LV E/e' lateral: 7.7 LV SV:         65 LV SV Index:   40 LVOT Area:     2.84 cm  LV Volumes (MOD) LV vol d, MOD A2C: 61.6 ml LV vol d, MOD A4C: 60.7 ml LV vol s, MOD A2C: 25.7 ml LV vol s, MOD A4C: 26.1 ml LV SV MOD A2C:     35.9 ml LV SV MOD A4C:     60.7 ml LV SV MOD BP:      34.9 ml RIGHT VENTRICLE            IVC RV Basal diam:  3.50 cm    IVC diam: 2.30 cm RV Mid diam:    2.60 cm RV S  prime:     9.46 cm/s TAPSE (M-mode): 2.3 cm LEFT ATRIUM             Index        RIGHT ATRIUM           Index LA diam:        3.20 cm 1.98 cm/m   RA Area:     12.50 cm LA Vol (A2C):   27.4 ml 16.98 ml/m  RA Volume:   25.10 ml  15.55 ml/m LA Vol (A4C):   43.6 ml 27.01 ml/m LA Biplane Vol: 34.2 ml 21.19 ml/m  AORTIC VALVE                    PULMONIC VALVE AV Area (Vmax):    2.63 cm     PV Vmax:        0.70 m/s AV Area (Vmean):   2.50 cm     PV Vmean:       46.800 cm/s AV Area (VTI):     2.39 cm     PV VTI:         0.156 m AV Vmax:           109.00 cm/s  PV Peak grad:   1.9 mmHg AV Vmean:          71.400 cm/s  PV Mean grad:   1.0 mmHg AV VTI:            0.273 m      RVOT Peak grad: 1 mmHg AV Peak Grad:      4.8 mmHg AV Mean Grad:      2.0 mmHg LVOT Vmax:         101.00 cm/s LVOT Vmean:        62.900 cm/s LVOT VTI:          0.230 m LVOT/AV VTI ratio: 0.84  AORTA Ao Root diam: 3.40 cm MITRAL VALVE                TRICUSPID VALVE MV Area (PHT): 4.80 cm     TR Peak grad:   27.2 mmHg MV Area VTI:   1.97 cm     TR Vmax:        261.00 cm/s MV Peak grad:  6.2 mmHg MV Mean grad:  1.0 mmHg     SHUNTS MV Vmax:       1.25 m/s     Systemic VTI:  0.23 m MV Vmean:      45.9 cm/s    Systemic Diam: 1.90 cm MV Decel Time: 158 msec     Pulmonic VTI:  0.122 m MV E velocity: 112.00 cm/s MV A velocity: 44.50 cm/s MV E/A ratio:  2.52 Marsa Dooms MD Electronically signed by Marsa Dooms MD Signature  Date/Time: 08/27/2024/1:26:42 PM    Final    CT Angio Chest PE W/Cm &/Or Wo Cm Result Date: 08/26/2024 EXAM: CTA of the Chest with contrast for PE 08/26/2024 09:05:01 PM TECHNIQUE: CTA of the chest was performed after the administration of intravenous contrast. Multiplanar reformatted images are provided for review. MIP images are provided for review. Automated exposure control, iterative reconstruction, and/or weight based adjustment of the mA/kV was utilized to reduce the radiation dose to as low as reasonably achievable. COMPARISON: 09/23/2020 CLINICAL HISTORY: Pulmonary embolism (PE) suspected, high probability. Patient found on the floor by EMS with chest pain, atrial fibrillation with rapid ventricular response, hypotension, and hypoxia. Initial blood pressure was 74/48. FINDINGS:  PULMONARY ARTERIES: Pulmonary arteries are adequately opacified for evaluation. No pulmonary embolism. Main pulmonary artery is normal in caliber. MEDIASTINUM: The heart and pericardium demonstrate no acute abnormality. No pericardial effusion. There is no acute abnormality of the thoracic aorta. Coronary artery and aortic atherosclerotic calcification. LYMPH NODES: No mediastinal, hilar or axillary lymphadenopathy. LUNGS AND PLEURA: The lungs are without acute process. No focal consolidation or pulmonary edema. No pleural effusion or pneumothorax. UPPER ABDOMEN: Limited images of the upper abdomen are unremarkable. SOFT TISSUES AND BONES: No acute bone or soft tissue abnormality. IMPRESSION: 1. No pulmonary embolism. 2. No acute pulmonary abnormality. Electronically signed by: Norman Gatlin MD 08/26/2024 09:10 PM EDT RP Workstation: [id number]   DG Chest 1 View Result Date: 08/26/2024 CLINICAL DATA:  Shortness of breath. EXAM: CHEST  1 VIEW COMPARISON:  Chest radiograph dated 12/17/2021. FINDINGS: The heart size and mediastinal contours are within normal limits. Both lungs are clear. The visualized skeletal structures are  unremarkable. IMPRESSION: No active disease. Electronically Signed   By: Vanetta Chou M.D.   On: 08/26/2024 19:59    ECHO as above.  TELEMETRY reviewed by me 08/31/2024: sinus rhythm, rate 50-60s  EKG reviewed by me: (09/10) with atrial fibrillation, rates 97 bpm.   Repeat EKG on 09/11 with sinus rhythm, rate 57 bpm.   Data reviewed by me 08/31/2024: last 24h vitals tele labs imaging I/O hospitalist progress notes, oncology notes  Principal Problem:   Atrial flutter with rapid ventricular response (HCC) Active Problems:   Chest pain due to myocardial ischemia   Essential tremor   Essential hypertension   Elevated troponin   Postural dizziness with presyncope   Acute on chronic anemia of CKD lllb   Anemia of chronic renal failure, stage 3b (HCC)   Seizure disorder (HCC)   Acute hypotension   Acute renal failure superimposed on stage 3b chronic kidney disease (HCC)   Acute respiratory failure with hypoxia (HCC)   Hyperparathyroidism (HCC)   Dyspnea on exertion   Shock circulatory (HCC)    ASSESSMENT AND PLAN:  [name] is a 63 y.o. male  with a past medical history of paroxysmal atrial fibrillation, hypertension, hyperlipidemia, CKD who presented to the ED on 08/26/2024 for intermittent palpitations a/w SOB for the past few days.  On arrival to ED patient found to be in atrial fibrillation RVR  rate 140s. IV Cardizem  initiated, led to hypotension requiring pressors. Cardiology was consulted for AF RVR.  # Severe Anemia/thrombocytopenia # Atrial fibrillation RVR # Paroxysmal atrial fibrillation # Hypokalemia -Monitor and replenish electrolytes for a goal K >4, Mag >2  -Hold off on rate control medication (BB, CCB) due to baseline bradycardia. Patient reportedly was taking multiple doses of propranolol  per day and has been treated for BB overdose while admitted. Will defer initiation of any BB on discharge, especially propranolol . -Previously anticoagulation had been  deferred given his CHA2DS2VASc of 1. Now with age 23 yo and hx HTN CHA2DS2VASc score is at least 2, recommend PO Eliquis 2.5 mg BID for stroke risk reduction. Patient is agreeable. Will hold off on starting at this time with severe anemia requiring multiple blood transfusions. This can be initiated at outpatient cardiology follow-up after repeat CBC with stable Hgb/plts. -Recommend possible cardiac monitor at outpatient follow-up to further evaluation intermittent palpitations a/w SOB, likely in/out of AF. (This has improved since admission), severe anemia requiring blood transfusion likely contributing.  -Scheduled outpatient EP for further evaluation, possibly discuss candidacy for ablation as stated below.  -  Oncology consulted. Plan for bone marrow biopsy today.  # Circulatory shock # Hypertension # Hyperlipidemia # Demand ischemia Patient without chest pain. Troponins minimally elevated and flat 24 > 23 > 25.  EKG without acute ischemic changes.  This admission with preserved EF, no RWMA, no significant valvular dysfunction. - Continue atorvastatin  80 mg daily. - Home antihypertensives (lisinopril  5 mg) held due to soft BP requiring pressors. - BP stable off neo since 9/13. Recommend MAP > 65. - Minimally elevated and flat troponins is most consistent with demand/supply mismatch and not ACS. No further cardiac diagnostics at this time.  No further cardiac recommendations at this time. Cardiology will sign off. Please haiku with questions or re-engage if needed.   Follow-up scheduled with Dr. Ammon on 09/25 at 10:30 AM  and with EP on 09/30 at 11 AM  This patient's plan of care was discussed and created with Dr. Custovic and he is in agreement.  Signed: Dorene Comfort, PA-C  08/31/2024, 10:50 AM [facility] Cardiology

## 2024-08-31 NOTE — Discharge Instructions (Addendum)
 [facility]  [address] [place], [place] [address] Phone: [phone]  Please contact the number above to inquire about getting a cell phone.   Home Health services has been set up with [facility]. Start of service will take place 24 to 48 hours after discharge.

## 2024-08-31 NOTE — Progress Notes (Signed)
 PT Cancellation Note  Patient Details Name: [name] MRN: [id number] DOB: 03-11-1961   Cancelled Treatment:    Reason Eval/Treat Not Completed: Patient at procedure or test/unavailable Attempted to see pt for PT tx, pt currently off floor for procedure. PT to re-attempt at later time/date when appropriate.    Nhu Glasby, SPT

## 2024-08-31 NOTE — Procedures (Signed)
 Interventional Radiology Procedure Note  Procedure: fluoro RT ILIAC BM ASP AND CORE    Complications: None  Estimated Blood Loss:  MIN  Findings: 11 G CORE AND ASP    M. TREVOR Deshana Rominger, MD

## 2024-08-31 NOTE — Progress Notes (Signed)
 Patient clinically stable post IR BMB per Dr Vanice, tolerated well. Vitals stable pre and post procedure. Received Versed  1 mg along with Fentanyl  50 mcg IV for procedure. Denies complaints post procedure. Report given to Jerel Cashing Rn post procedure/specials/12

## 2024-08-31 NOTE — Progress Notes (Signed)
 Patient transferred to 2A room 242. Called report to Vertell, Charity fundraiser. Transferred patient via bed.

## 2024-08-31 NOTE — Hospital Course (Signed)
 63 yo M presenting to [facility] ED from home via EMS for evaluation of chest pain.   Patient was in his normal state of health until 08/23/24 when he felt something hard squeezing my chest as he was walking back to the house while taking out the trash. He was not sure if this was his A-fib or a sign of a heart attack and self-administered propanolol which he takes PRN for palpitations and lay down. His chest pain improved however over the next few days he experienced generalized fatigue with dyspnea on exertion as well as increased lower extremity edema. He called EMS was he was unable to to bear his own weight. He denies fever/ chills, abdominal pain/ nausea/ vomiting/ diarrhea, urinary symptoms, new cough/ congestion, signs of bleeding, falls or LOC.   08/29/24- patient improved clinically, neosynephrine is being weaned off but still needed due to hypotension.  He was able to walk with PT.  08/30/24- patient is off neosynephrine. His CKD is stable.  He does have BM biopsy planned to investigate AED induced BMS.   He will be able to fully address this issue on outpatient.  Its likely partially responsive for some of his blood work abnormalities vs CKD induced chronic anemia 9/15.  Hyperkalemia with potassium of 6.1.  Low potassium diet ordered.  Veltassa  ordered.  Calcium  and bicarb also ordered.  Bone marrow biopsy done today.

## 2024-08-31 NOTE — Progress Notes (Addendum)
 Physical Therapy Treatment Patient Details Name: [name] MRN: [id number] DOB: 07/04/1961 Today's Date: 08/31/2024   History of Present Illness [name] is a 63 y.o. male  with a past medical history of paroxysmal atrial fibrillation, seizures, hypertension, hyperlipidemia, CKD who presented to the ED on 08/26/2024 for intermittent palpitations a/w SOB for the past few days.  On arrival to ED patient found to be in atrial fibrillation RVR  rate 140s. IV Cardizem  initiated, led to hypotension requiring pressors    PT Comments  Pt A&Ox4, agreeable to PT/OT co-tx (cotreat due to recent biopsy). Pt denied pain throughout session. Pt was met supine in bed, able to perform bed mobility with CGA. STS from EOB and BSC with RW and CGA. Pt able to perform step-pivot transfers EOB > BSC > recliner with RW and CGA, VC for sequencing and hand placement throughout. Vital signs were monitored throughout session and remained stable. Additional mobility deferred this session due to pt's recent bone marrow biopsy. Pt was left seated in recliner at end of session, all needs in reach. The patient would benefit from further skilled PT intervention to continue to progress towards goals.      If plan is discharge home, recommend the following: A little help with walking and/or transfers;A lot of help with bathing/dressing/bathroom;Assistance with cooking/housework;Assist for transportation;Help with stairs or ramp for entrance   Can travel by private vehicle     Yes  Equipment Recommendations  Other (comment) (TBD)    Recommendations for Other Services       Precautions / Restrictions Precautions Precautions: Fall Recall of Precautions/Restrictions: Impaired Restrictions Weight Bearing Restrictions Per Provider Order:  (per IR via secure chat- bed rest 1 hr after biopsy, ok with bed to chair today 9/15)     Mobility  Bed Mobility Overal bed mobility: Needs Assistance Bed Mobility: Supine to  Sit     Supine to sit: Contact guard, Used rails, HOB elevated     General bed mobility comments: increased time/effort, no physical assistance required    Transfers Overall transfer level: Needs assistance Equipment used: Rolling walker (2 wheels) Transfers: Sit to/from Stand, Bed to chair/wheelchair/BSC Sit to Stand: Contact guard assist   Step pivot transfers: Contact guard assist       General transfer comment: no physical assistance for STS from EOB/BSC. Pt able to transfer EOB > BSC > recliner with CGA and VC for sequencing    Ambulation/Gait                   Stairs             Wheelchair Mobility     Tilt Bed    Modified Rankin (Stroke Patients Only)       Balance Overall balance assessment: Needs assistance Sitting-balance support: Feet supported Sitting balance-Leahy Scale: Good     Standing balance support: Bilateral upper extremity supported, Reliant on assistive device for balance Standing balance-Leahy Scale: Fair Standing balance comment: unsteady in standing due to tremors                            Communication Communication Communication: No apparent difficulties  Cognition Arousal: Alert Behavior During Therapy: WFL for tasks assessed/performed                             Following commands: Impaired Following commands impaired: Follows one step commands with  increased time    Cueing Cueing Techniques: Verbal cues, Visual cues  Exercises Other Exercises Other Exercises: Vitals monitored and remained stable throughout session    General Comments General comments (skin integrity, edema, etc.): vss throughout      Pertinent Vitals/Pain Pain Assessment Pain Assessment: No/denies pain    Home Living                          Prior Function            PT Goals (current goals can now be found in the care plan section) Progress towards PT goals: Progressing toward goals     Frequency    Min 3X/week      PT Plan      Co-evaluation PT/OT/SLP Co-Evaluation/Treatment: Yes Reason for Co-Treatment: Complexity of the patient's impairments (multi-system involvement) PT goals addressed during session: Mobility/safety with mobility OT goals addressed during session: ADL's and self-care      AM-PAC PT 6 Clicks Mobility   Outcome Measure  Help needed turning from your back to your side while in a flat bed without using bedrails?: A Little Help needed moving from lying on your back to sitting on the side of a flat bed without using bedrails?: A Little Help needed moving to and from a bed to a chair (including a wheelchair)?: A Little Help needed standing up from a chair using your arms (e.g., wheelchair or bedside chair)?: A Little Help needed to walk in hospital room?: A Little Help needed climbing 3-5 steps with a railing? : A Lot 6 Click Score: 17    End of Session   Activity Tolerance: Patient tolerated treatment well Patient left: in chair;with call bell/phone within reach;with chair alarm set Nurse Communication: Mobility status PT Visit Diagnosis: Unsteadiness on feet (R26.81);Other abnormalities of gait and mobility (R26.89);Muscle weakness (generalized) (M62.81);History of falling (Z91.81);Difficulty in walking, not elsewhere classified (R26.2);Dizziness and giddiness (R42)     Time: 8589-8560 PT Time Calculation (min) (ACUTE ONLY): 29 min  Charges:    $Therapeutic Activity: 8-22 mins PT General Charges $$ ACUTE PT VISIT: 1 Visit                     Halston Fairclough, SPT

## 2024-08-31 NOTE — Progress Notes (Signed)
 Occupational Therapy Treatment Patient Details Name: [name] MRN: [id number] DOB: 08/15/1961 Today's Date: 08/31/2024   History of present illness [name] is a 63 y.o. male  with a past medical history of paroxysmal atrial fibrillation, seizures, hypertension, hyperlipidemia, CKD who presented to the ED on 08/26/2024 for intermittent palpitations a/w SOB for the past few days.  On arrival to ED patient found to be in atrial fibrillation RVR  rate 140s. IV Cardizem  initiated, led to hypotension requiring pressors   OT comments  Chart reviewed to date, pt greeted semi supine in bed, agreeable to OT tx session targeting improving functional activity tolerance in prep for ADL tasks. Pt continues to perform ADL/functional mobility below PLOF. CGA required for bed mobility, STS with CGA-MIN A, MIN A required for toilet transfer, MAX A for peri care. Pt is left in bedside chair, all needs met. OT will continue to follow.       If plan is discharge home, recommend the following:  A lot of help with bathing/dressing/bathroom;A little help with walking and/or transfers   Equipment Recommendations  BSC/3in1    Recommendations for Other Services      Precautions / Restrictions Precautions Precautions: Fall Recall of Precautions/Restrictions: Impaired Restrictions Weight Bearing Restrictions Per Provider Order:  (per IR via secure chat- bed rest 1 hr after biopsy, ok with bed to chair today 9/15)       Mobility Bed Mobility Overal bed mobility: Needs Assistance Bed Mobility: Supine to Sit     Supine to sit: Contact guard, Used rails, HOB elevated          Transfers Overall transfer level: Needs assistance Equipment used: Rolling walker (2 wheels) Transfers: Sit to/from Stand Sit to Stand: Contact guard assist, Min assist                 Balance Overall balance assessment: Needs assistance Sitting-balance support: Feet supported Sitting balance-Leahy Scale:  Good     Standing balance support: Bilateral upper extremity supported, Reliant on assistive device for balance Standing balance-Leahy Scale: Fair                             ADL either performed or assessed with clinical judgement   ADL Overall ADL's : Needs assistance/impaired Eating/Feeding: Maximal assistance   Grooming: Moderate assistance;Sitting               Lower Body Dressing: Maximal assistance   Toilet Transfer: Minimal assistance;BSC/3in1;Rolling walker (2 wheels) Toilet Transfer Details (indicate cue type and reason): to bsc then to chair, intermittent vcs for technique Toileting- Clothing Manipulation and Hygiene: Maximal assistance;Sit to/from stand Toileting - Clothing Manipulation Details (indicate cue type and reason): for thoroughness            Extremity/Trunk Assessment Upper Extremity Assessment RUE Deficits / Details: continued intention tremors LUE Deficits / Details: continued intention tremors            Vision Baseline Vision/History: 1 Wears glasses Patient Visual Report: No change from baseline     Perception     Praxis     Communication Communication Communication: No apparent difficulties   Cognition Arousal: Alert Behavior During Therapy: WFL for tasks assessed/performed Cognition: No family/caregiver present to determine baseline, Cognition impaired         Attention impairment (select first level of impairment): Selective attention  Following commands: Impaired Following commands impaired: Follows one step commands with increased time      Cueing   Cueing Techniques: Verbal cues, Visual cues  Exercises Other Exercises Other Exercises: edu re role of OT, role of rehab, discharge recommendations    Shoulder Instructions       General Comments vss throughout    Pertinent Vitals/ Pain       Pain Assessment Pain Assessment: No/denies pain  Home Living                                           Prior Functioning/Environment              Frequency  Min 2X/week        Progress Toward Goals  OT Goals(current goals can now be found in the care plan section)  Progress towards OT goals: Progressing toward goals  Acute Rehab OT Goals Time For Goal Achievement: 09/11/24  Plan      Co-evaluation    PT/OT/SLP Co-Evaluation/Treatment: Yes Reason for Co-Treatment: Complexity of the patient's impairments (multi-system involvement)   OT goals addressed during session: ADL's and self-care      AM-PAC OT 6 Clicks Daily Activity     Outcome Measure   Help from another person eating meals?: A Lot Help from another person taking care of personal grooming?: A Little Help from another person toileting, which includes using toliet, bedpan, or urinal?: A Lot Help from another person bathing (including washing, rinsing, drying)?: A Lot Help from another person to put on and taking off regular upper body clothing?: A Little Help from another person to put on and taking off regular lower body clothing?: A Lot 6 Click Score: 14    End of Session Equipment Utilized During Treatment: Rolling walker (2 wheels)  OT Visit Diagnosis: Other abnormalities of gait and mobility (R26.89);Muscle weakness (generalized) (M62.81)   Activity Tolerance Patient tolerated treatment well   Patient Left in chair;with call bell/phone within reach;with chair alarm set   Nurse Communication Mobility status        Time: 8590-8565 OT Time Calculation (min): 25 min  Charges: OT General Charges $OT Visit: 1 Visit OT Treatments $Therapeutic Activity: 8-22 mins  Therisa Sheffield, OTD OTR/L  08/31/24, 4:02 PM

## 2024-08-31 NOTE — Assessment & Plan Note (Signed)
 Continue Veltassa .  Placed on renal diet.  Renal diet list to go home with.

## 2024-08-31 NOTE — Progress Notes (Signed)
 Progress Note   Patient: [name] FMW:[id number] DOB: 1961-04-08 DOA: 08/26/2024     4 DOS: the patient was seen and examined on 08/31/2024   Brief hospital course: 63 yo M presenting to [facility] ED from home via EMS for evaluation of chest pain.   Patient was in his normal state of health until 08/23/24 when he felt something hard squeezing my chest as he was walking back to the house while taking out the trash. He was not sure if this was his A-fib or a sign of a heart attack and self-administered propanolol which he takes PRN for palpitations and lay down. His chest pain improved however over the next few days he experienced generalized fatigue with dyspnea on exertion as well as increased lower extremity edema. He called EMS was he was unable to to bear his own weight. He denies fever/ chills, abdominal pain/ nausea/ vomiting/ diarrhea, urinary symptoms, new cough/ congestion, signs of bleeding, falls or LOC.   08/29/24- patient improved clinically, neosynephrine is being weaned off but still needed due to hypotension.  He was able to walk with PT.  08/30/24- patient is off neosynephrine. His CKD is stable.  He does have BM biopsy planned to investigate AED induced BMS.   He will be able to fully address this issue on outpatient.  Its likely partially responsive for some of his blood work abnormalities vs CKD induced chronic anemia 9/15.  Hyperkalemia with potassium of 6.1.  Low potassium diet ordered.  Veltassa  ordered.  Calcium  and bicarb also ordered.  Bone marrow biopsy done today.   Assessment and Plan: * Hyperkalemia Continue Veltassa .  Calcium  and sodium bicarb ordered.  Shock circulatory (HCC) Secondary to rapid atrial fibrillation and propranolol .  Off Neo-Synephrine.  On midodrine .  No signs of bleeding.  Atrial flutter with rapid ventricular response (HCC) On presentation.  Now sinus bradycardia.  Off systemic anticoagulation due to anemia.  EF 60%  Dyspnea on  exertion Secondary to anemia or fast heart rate  Chest pain due to myocardial ischemia Demand ischemia with fast heart rate  Acute respiratory failure with hypoxia (HCC) Was reportedly hypoxic to the 80s with EMS improved with O2 at 4 L.  This problem has resolved and off oxygen.  Acute on chronic anemia of CKD lllb Received 1 unit of packed red blood cells while here.  Ferritin elevated going along with anemia of chronic disease.  Last hemoglobin 9.5.  Bone marrow biopsy today.  Acute renal failure superimposed on stage 3b chronic kidney disease (HCC) Today's creatinine 2.0.  Lowest creatinine 1.53.  Highest creatinine 2.02  Hyperparathyroidism (HCC) Recent PTH intact elevated at 179 Being managed outpatient by nephrology  Seizure disorder (HCC) Continue Depakote  and Keppra   Essential tremor Will start primidone         Subjective: Patient feels okay.  Has tremor.  Admitted with atrial flutter with rapid ventricular response and hypotension.  Physical Exam: Vitals:   08/31/24 1039 08/31/24 1050 08/31/24 1100 08/31/24 1115  BP: 96/68 (!) 102/55 (!) 104/57 (!) 97/57  Pulse: 74 70 66 (!) 58  Resp: 20 14 15 14   Temp:      TempSrc:      SpO2: 100% 97% 96% 97%  Weight:      Height:       Physical Exam HENT:     Head: Normocephalic.  Eyes:     General: Lids are normal.     Conjunctiva/sclera: Conjunctivae normal.  Cardiovascular:     Rate  and Rhythm: Regular rhythm. Bradycardia present.     Heart sounds: Normal heart sounds, S1 normal and S2 normal.  Pulmonary:     Breath sounds: No decreased breath sounds, wheezing, rhonchi or rales.  Abdominal:     Palpations: Abdomen is soft.     Tenderness: There is no abdominal tenderness.  Musculoskeletal:     Right lower leg: No swelling.     Left lower leg: No swelling.  Skin:    General: Skin is warm.     Findings: No rash.  Neurological:     Mental Status: He is alert.     Data Reviewed: Potassium 6.1,  creatinine 2.0  Family Communication: Declined  Disposition: Status is: Inpatient Remains inpatient appropriate because: Transfer out of ICU today.  Planned Discharge Destination: Physical therapy recommending rehab    Time spent: 28 minutes  Author: Charlie Patterson, MD 08/31/2024 11:50 AM  For on call review [email or web address].

## 2024-09-01 ENCOUNTER — Other Ambulatory Visit (HOSPITAL_COMMUNITY): Payer: Self-pay

## 2024-09-01 ENCOUNTER — Telehealth (HOSPITAL_COMMUNITY): Payer: Self-pay | Admitting: Pharmacy Technician

## 2024-09-01 DIAGNOSIS — R579 Shock, unspecified: Secondary | ICD-10-CM | POA: Diagnosis not present

## 2024-09-01 DIAGNOSIS — R338 Other retention of urine: Secondary | ICD-10-CM | POA: Insufficient documentation

## 2024-09-01 DIAGNOSIS — E213 Hyperparathyroidism, unspecified: Secondary | ICD-10-CM | POA: Diagnosis not present

## 2024-09-01 DIAGNOSIS — J9601 Acute respiratory failure with hypoxia: Secondary | ICD-10-CM | POA: Diagnosis not present

## 2024-09-01 DIAGNOSIS — G250 Essential tremor: Secondary | ICD-10-CM | POA: Diagnosis not present

## 2024-09-01 DIAGNOSIS — G40909 Epilepsy, unspecified, not intractable, without status epilepticus: Secondary | ICD-10-CM | POA: Diagnosis not present

## 2024-09-01 DIAGNOSIS — I4892 Unspecified atrial flutter: Secondary | ICD-10-CM | POA: Diagnosis not present

## 2024-09-01 DIAGNOSIS — E875 Hyperkalemia: Secondary | ICD-10-CM | POA: Diagnosis not present

## 2024-09-01 DIAGNOSIS — N179 Acute kidney failure, unspecified: Secondary | ICD-10-CM | POA: Diagnosis not present

## 2024-09-01 LAB — MULTIPLE MYELOMA PANEL, SERUM
Albumin SerPl Elph-Mcnc: 2.8 g/dL — ABNORMAL LOW (ref 2.9–4.4)
Albumin/Glob SerPl: 1.5 (ref 0.7–1.7)
Alpha 1: 0.2 g/dL (ref 0.0–0.4)
Alpha2 Glob SerPl Elph-Mcnc: 0.5 g/dL (ref 0.4–1.0)
B-Globulin SerPl Elph-Mcnc: 0.7 g/dL (ref 0.7–1.3)
Gamma Glob SerPl Elph-Mcnc: 0.6 g/dL (ref 0.4–1.8)
Globulin, Total: 1.9 g/dL — ABNORMAL LOW (ref 2.2–3.9)
IgA: 140 mg/dL (ref 61–437)
IgG (Immunoglobin G), Serum: 727 mg/dL (ref 603–1613)
IgM (Immunoglobulin M), Srm: 55 mg/dL (ref 20–172)
Total Protein ELP: 4.7 g/dL — ABNORMAL LOW (ref 6.0–8.5)

## 2024-09-01 LAB — BASIC METABOLIC PANEL WITH GFR
Anion gap: 11 (ref 5–15)
BUN: 30 mg/dL — ABNORMAL HIGH (ref 8–23)
CO2: 25 mmol/L (ref 22–32)
Calcium: 9.6 mg/dL (ref 8.9–10.3)
Chloride: 96 mmol/L — ABNORMAL LOW (ref 98–111)
Creatinine, Ser: 2.05 mg/dL — ABNORMAL HIGH (ref 0.61–1.24)
GFR, Estimated: 35 mL/min — ABNORMAL LOW (ref >60)
Glucose, Bld: 83 mg/dL (ref 70–99)
Potassium: 5.6 mmol/L — ABNORMAL HIGH (ref 3.5–5.1)
Sodium: 132 mmol/L — ABNORMAL LOW (ref 135–145)

## 2024-09-01 LAB — CULTURE, BLOOD (ROUTINE X 2)
Culture: NO GROWTH
Culture: NO GROWTH
Special Requests: ADEQUATE
Special Requests: ADEQUATE

## 2024-09-01 LAB — POTASSIUM: Potassium: 5.3 mmol/L — ABNORMAL HIGH (ref 3.5–5.1)

## 2024-09-01 LAB — SURGICAL PATHOLOGY

## 2024-09-01 LAB — HEMOGLOBIN: Hemoglobin: 9.5 g/dL — ABNORMAL LOW (ref 13.0–17.0)

## 2024-09-01 MED ORDER — TAMSULOSIN HCL 0.4 MG PO CAPS
0.4000 mg | ORAL_CAPSULE | Freq: Every day | ORAL | Status: DC
  Administered 2024-09-01 – 2024-09-04 (×4): 0.4 mg via ORAL
  Filled 2024-09-01 (×4): qty 1

## 2024-09-01 MED ORDER — PANTOPRAZOLE SODIUM 40 MG PO TBEC
40.0000 mg | DELAYED_RELEASE_TABLET | Freq: Two times a day (BID) | ORAL | Status: DC
  Administered 2024-09-01 – 2024-09-05 (×8): 40 mg via ORAL
  Filled 2024-09-01 (×8): qty 1

## 2024-09-01 MED ORDER — SODIUM CHLORIDE 0.9 % IV BOLUS
250.0000 mL | Freq: Once | INTRAVENOUS | Status: AC
  Administered 2024-09-01: 250 mL via INTRAVENOUS

## 2024-09-01 MED ORDER — SODIUM ZIRCONIUM CYCLOSILICATE 10 G PO PACK
10.0000 g | PACK | Freq: Once | ORAL | Status: AC
  Administered 2024-09-01: 10 g via ORAL
  Filled 2024-09-01: qty 1

## 2024-09-01 MED ORDER — SODIUM ZIRCONIUM CYCLOSILICATE 10 G PO PACK
10.0000 g | PACK | Freq: Every day | ORAL | Status: DC
  Administered 2024-09-02 – 2024-09-05 (×4): 10 g via ORAL
  Filled 2024-09-01 (×4): qty 1

## 2024-09-01 NOTE — Plan of Care (Signed)
   Problem: Education: Goal: Knowledge of General Education information will improve Description: Including pain rating scale, medication(s)/side effects and non-pharmacologic comfort measures Outcome: Progressing   Problem: Health Behavior/Discharge Planning: Goal: Ability to manage health-related needs will improve Outcome: Progressing   Problem: Clinical Measurements: Goal: Ability to maintain clinical measurements within normal limits will improve Outcome: Progressing   Problem: Activity: Goal: Risk for activity intolerance will decrease Outcome: Progressing   Problem: Elimination: Goal: Will not experience complications related to urinary retention Outcome: Progressing

## 2024-09-01 NOTE — Progress Notes (Signed)
 PHARMACIST - PHYSICIAN COMMUNICATION  DR:   Josette  CONCERNING: IV to Oral Route Change Policy  RECOMMENDATION: This patient is receiving pantoprazole  by the intravenous route.  Based on criteria approved by the Pharmacy and Therapeutics Committee, the intravenous medication(s) is/are being converted to the equivalent oral dose form(s).   DESCRIPTION: These criteria include: The patient is eating (either orally or via tube) and/or has been taking other orally administered medications for a least 24 hours The patient has no evidence of active gastrointestinal bleeding or impaired GI absorption (gastrectomy, short bowel, patient on TNA or NPO).  [name] PharmD, BCPS 09/01/2024 12:13 PM

## 2024-09-01 NOTE — Progress Notes (Signed)
 EOS  Pt A&Ox4, but forgetful - did not remember why his side hurt - bone marrow biopsy was done   Pt constantly calling to state that his catheter was leaking - it was mostly dry overnight, but it did leak when pt was observed to be touching it, unsure if leakage is natural or imposed  BM overnight to [facility], x1 assist, but pt has very severe tremors - pt states that he is able to independently take care of himself at home despite the tremors

## 2024-09-01 NOTE — Assessment & Plan Note (Signed)
 Secondary to BPH.  On Flomax  and Proscar .  Patient wants to have the Foley out.  Since he is going to go home upon discharge, I will see if we can take it out this evening at 12 midnight.

## 2024-09-01 NOTE — Telephone Encounter (Signed)
 Patient Product/process development scientist completed.    The patient is insured through [employer]. Patient has Medicare and is not eligible for a copay card, but may be able to apply for patient assistance or Medicare RX Payment Plan (Patient Must reach out to their plan, if eligible for payment plan), if available.    Ran test claim for Lokelma  10 g and the current 30 day co-pay is $0.00.  Ran test claim for Veltassa  8.4 g and Requires Prior Authorization  This test claim was processed through [facility]- copay amounts may vary at other pharmacies due to [employer], or as the patient moves through the different stages of their insurance plan.     Reyes Sharps, CPHT Pharmacy Technician III Certified Patient Advocate [facility] Patient Advocate Team Direct Number: [phone]  Fax: [phone]

## 2024-09-01 NOTE — TOC Progression Note (Signed)
 Transition of Care [facility]) - Progression Note    Patient Details  Name: [name] MRN: [id number] Date of Birth: 22-Jan-1961  Transition of Care [facility]) CM/SW Contact  Racheal LITTIE Schimke, RN Phone Number: 09/01/2024, 3:34 PM  Clinical Narrative:  CM spoke with patient at bedside about SNF recommendation, he declined and says that he has great support from [facility] family, however consented to [facility] services. HHPT/OT/SW/RN/Aide arranged with Shaun, [facility]. MD also aware.                      Expected Discharge Plan and Services                                               Social Drivers of Health (SDOH) Interventions SDOH Screenings   Food Insecurity: No Food Insecurity (08/31/2024)  Housing: Low Risk  (08/31/2024)  Transportation Needs: No Transportation Needs (08/31/2024)  Utilities: Not At Risk (08/31/2024)  Financial Resource Strain: Low Risk  (07/10/2023)   Received from [facility]  Social Connections: Unknown (08/31/2024)  Tobacco Use: Low Risk  (08/26/2024)    Readmission Risk Interventions     No data to display

## 2024-09-01 NOTE — Progress Notes (Signed)
 Progress Note   Patient: [name] DOB: August 28, 1961 DOA: 08/26/2024     5 DOS: the patient was seen and examined on 09/01/2024   Brief hospital course: 63 yo M presenting to [facility] ED from home via EMS for evaluation of chest pain.   Patient was in his normal state of health until 08/23/24 when he felt something hard squeezing my chest as he was walking back to the house while taking out the trash. He was not sure if this was his A-fib or a sign of a heart attack and self-administered propanolol which he takes PRN for palpitations and lay down. His chest pain improved however over the next few days he experienced generalized fatigue with dyspnea on exertion as well as increased lower extremity edema. He called EMS was he was unable to to bear his own weight. He denies fever/ chills, abdominal pain/ nausea/ vomiting/ diarrhea, urinary symptoms, new cough/ congestion, signs of bleeding, falls or LOC.   08/29/24- patient improved clinically, neosynephrine is being weaned off but still needed due to hypotension.  He was able to walk with PT.  08/30/24- patient is off neosynephrine. His CKD is stable.  He does have BM biopsy planned to investigate AED induced BMS.   He will be able to fully address this issue on outpatient.  Its likely partially responsive for some of his blood work abnormalities vs CKD induced chronic anemia 9/15.  Hyperkalemia with potassium of 6.1.  Low potassium diet ordered.  Veltassa  ordered.  Calcium  and bicarb also ordered.  Bone marrow biopsy done today.  Assessment and Plan: * Hyperkalemia Continue Veltassa .  Placed on renal diet.  Renal diet list to go home with.  Shock circulatory (HCC) Secondary to rapid atrial fibrillation and propranolol .  Off Neo-Synephrine.  On midodrine .  No signs of bleeding.  Atrial flutter with rapid ventricular response (HCC) On presentation.  Now sinus bradycardia.  Off systemic anticoagulation due to anemia.  EF 60%  Dyspnea  on exertion Secondary to anemia and/or fast heart rate  Chest pain due to myocardial ischemia Demand ischemia with fast heart rate  Acute respiratory failure with hypoxia (HCC) Was reportedly hypoxic to the 80s with EMS improved with O2 at 4 L.  This problem has resolved and off oxygen.  Acute on chronic anemia of CKD lllb Received 1 unit of packed red blood cells while here.  Ferritin elevated going along with anemia of chronic disease.  Last hemoglobin 9.5.  Bone marrow biopsy yesterday (results pending).  Acute renal failure superimposed on stage 3b chronic kidney disease (HCC) Today's creatinine 2.05.  Lowest creatinine 1.53.  Highest creatinine 2.05.  Will give a fluid bolus today.  Acute urinary retention Secondary to BPH.  On Flomax  and Proscar .  Patient wants to have the Foley out.  Since he is going to go home upon discharge, I will see if we can take it out this evening at 12 midnight.  Hyperparathyroidism (HCC) Recent PTH intact elevated at 179 Being managed outpatient by nephrology  Seizure disorder [facility]) Continue Depakote  and Keppra   Essential tremor Continue primidone         Subjective: Patient will refuse rehab but wants to go home with home health.  Patient was able to get up from the bed and walk with a walker to the bathroom while I was in the room.  He wants the Foley catheter out.  Admitted with hypotension and rapid atrial fibrillation.  Physical Exam: Vitals:   09/01/24 0338 09/01/24  0500 09/01/24 0823 09/01/24 1137  BP: 115/67  108/69 111/65  Pulse:   (!) 54 (!) 49  Resp: 14  18 18   Temp: 98 F (36.7 C)  98.1 F (36.7 C) 97.8 F (36.6 C)  TempSrc: Oral     SpO2: 100%  99% 99%  Weight:  57.6 kg    Height:       Physical Exam HENT:     Head: Normocephalic.  Eyes:     General: Lids are normal.     Conjunctiva/sclera: Conjunctivae normal.  Cardiovascular:     Rate and Rhythm: Regular rhythm. Bradycardia present.     Heart sounds: Normal  heart sounds, S1 normal and S2 normal.  Pulmonary:     Breath sounds: No decreased breath sounds, wheezing, rhonchi or rales.  Abdominal:     Palpations: Abdomen is soft.     Tenderness: There is no abdominal tenderness.  Musculoskeletal:     Right lower leg: No swelling.     Left lower leg: No swelling.  Skin:    General: Skin is warm.     Findings: No rash.  Neurological:     Mental Status: He is alert.     Data Reviewed: Sodium 132, potassium 5.6, creatinine 2.05, hemoglobin 9.5 Family Communication: Refused  Disposition: Status is: Inpatient Remains inpatient appropriate because: Still with high potassium this morning.  Placed on renal diet since this is a recurring thing.  Nursing staff states that he eats a lot of potato casseroles at home.  Planned Discharge Destination: Home with Home Health (patient refusing rehab)    Time spent: 28 minutes  Author: Charlie Patterson, MD 09/01/2024 12:33 PM  For on call review [email or web address].

## 2024-09-02 DIAGNOSIS — E875 Hyperkalemia: Secondary | ICD-10-CM | POA: Diagnosis not present

## 2024-09-02 LAB — BASIC METABOLIC PANEL WITH GFR
Anion gap: 8 (ref 5–15)
BUN: 31 mg/dL — ABNORMAL HIGH (ref 8–23)
CO2: 27 mmol/L (ref 22–32)
Calcium: 9.5 mg/dL (ref 8.9–10.3)
Chloride: 96 mmol/L — ABNORMAL LOW (ref 98–111)
Creatinine, Ser: 1.89 mg/dL — ABNORMAL HIGH (ref 0.61–1.24)
GFR, Estimated: 39 mL/min — ABNORMAL LOW (ref >60)
Glucose, Bld: 87 mg/dL (ref 70–99)
Potassium: 4.7 mmol/L (ref 3.5–5.1)
Sodium: 131 mmol/L — ABNORMAL LOW (ref 135–145)

## 2024-09-02 LAB — URINALYSIS, COMPLETE (UACMP) WITH MICROSCOPIC
Bacteria, UA: NONE SEEN
Bilirubin Urine: NEGATIVE
Glucose, UA: NEGATIVE mg/dL
Hgb urine dipstick: NEGATIVE
Ketones, ur: NEGATIVE mg/dL
Leukocytes,Ua: NEGATIVE
Nitrite: NEGATIVE
Protein, ur: NEGATIVE mg/dL
Specific Gravity, Urine: 1.011 (ref 1.005–1.030)
Squamous Epithelial / HPF: 0 /HPF (ref 0–5)
pH: 5 (ref 5.0–8.0)

## 2024-09-02 NOTE — Progress Notes (Addendum)
 Progress Note   Patient: [name] FMW:[id number] DOB: Apr 06, 1961 DOA: 08/26/2024     6 DOS: the patient was seen and examined on 09/02/2024   Brief hospital course: 63 yo M presenting to [facility] ED from home via EMS for evaluation of chest pain.   Patient was in his normal state of health until 08/23/24 when he felt something hard squeezing my chest as he was walking back to the house while taking out the trash. He was not sure if this was his A-fib or a sign of a heart attack and self-administered propanolol which he takes PRN for palpitations and lay down. His chest pain improved however over the next few days he experienced generalized fatigue with dyspnea on exertion as well as increased lower extremity edema. He called EMS was he was unable to to bear his own weight. He denies fever/ chills, abdominal pain/ nausea/ vomiting/ diarrhea, urinary symptoms, new cough/ congestion, signs of bleeding, falls or LOC.    Assessment and Plan: Hyperkalemia - resolved Continue Lokelma .  Placed on renal diet.  Renal diet list to go home with.   Shock circulatory (HCC) Secondary to rapid atrial fibrillation and propranolol .  Off Neo-Synephrine.  On midodrine .  No signs of bleeding.   Atrial flutter with rapid ventricular response (HCC) On presentation.  Now sinus bradycardia.  Off systemic anticoagulation due to anemia.  EF 60% Cardiac monitor outpatient per cardiology, follow-up outpatient with EP   Dyspnea on exertion Secondary to anemia and/or fast heart rate   Chest pain due to myocardial ischemia Demand ischemia with fast heart rate   Acute respiratory failure with hypoxia (HCC) - resolved Was reportedly hypoxic to the 80s with EMS improved with O2 at 4 L.  This problem has resolved and off oxygen.   Acute on chronic anemia of CKD lllb Received 1 unit of packed red blood cells.  Ferritin elevated going along with anemia of chronic disease.  Last hemoglobin 9.5.  Bone marrow biopsy  09/15 (results pending)   Acute renal failure superimposed on stage 3b chronic kidney disease (HCC) S/p IV fluids Cr improved 2.05 -> 1.89 Baseline Cr ? 1.53 Monitor Cr   Acute urinary retention Secondary to BPH.  On Flomax  and Proscar .  Foley removed 09/16 Able to void   Hyperparathyroidism (HCC) Recent PTH intact elevated at 179 Being managed outpatient by nephrology   Seizure disorder (HCC) Continue Depakote  and Keppra    Essential tremor Continue primidone  Propranolol  on hold due to bradycardia    Subjective: Patient states he feels much better today, has increased frequency of voiding.  Will check UA, BMs regular.  Will try to wean off midodrine , anticipate discharge tomorrow Refused SNF, would like to go home with home health.  Mobility improving, hopefully will be stronger enough to go home tomorrow  Physical Exam: Vitals:   09/01/24 2026 09/01/24 2029 09/02/24 0000 09/02/24 0340  BP:  121/80 115/69 110/66  Pulse:  69 78 (!) 48  Resp: 15 18 18 18   Temp:  98.2 F (36.8 C) 97.8 F (36.6 C) 97.8 F (36.6 C)  TempSrc:      SpO2:  99% 98% 98%  Weight:      Height:       Physical Exam HENT:     Head: Normocephalic.  Eyes:     General: Lids are normal.     Conjunctiva/sclera: Conjunctivae normal.  Cardiovascular:     Rate and Rhythm: Regular rhythm. Bradycardia present.     Heart  sounds: Normal heart sounds, S1 normal and S2 normal.  Pulmonary:     Breath sounds: No decreased breath sounds, wheezing, rhonchi or rales.  Abdominal:     Palpations: Abdomen is soft.     Tenderness: There is no abdominal tenderness.  Musculoskeletal:     Right lower leg: No swelling.     Left lower leg: No swelling.  Skin:    General: Skin is warm.     Findings: No rash.  Neurological:     Mental Status: He is alert.     Data Reviewed: Labs reviewed  Disposition: Status is: Inpatient Remains inpatient appropriate because: Hyperkalemia resolved, labs improving.   Continues to have increased frequency of urine, check UA. Will try to wean off midodrine , anticipate discharge tomorrow if stable   Planned Discharge Destination: Home with Home Health (patient refusing rehab)    Time spent: 50 minutes  Author: Laree Lock, MD 09/02/2024 8:15 AM  For on call review [email or web address].

## 2024-09-02 NOTE — Plan of Care (Signed)
  Problem: Health Behavior/Discharge Planning: Goal: Ability to manage health-related needs will improve Outcome: Progressing   Problem: Clinical Measurements: Goal: Ability to maintain clinical measurements within normal limits will improve Outcome: Progressing Goal: Will remain free from infection Outcome: Progressing Goal: Diagnostic test results will improve Outcome: Progressing Goal: Respiratory complications will improve Outcome: Progressing   Problem: Activity: Goal: Risk for activity intolerance will decrease Outcome: Progressing   Problem: Nutrition: Goal: Adequate nutrition will be maintained Outcome: Progressing   Problem: Coping: Goal: Level of anxiety will decrease Outcome: Progressing   Problem: Elimination: Goal: Will not experience complications related to urinary retention Outcome: Progressing   Problem: Pain Managment: Goal: General experience of comfort will improve and/or be controlled Outcome: Progressing   Problem: Activity: Goal: Ability to tolerate increased activity will improve Outcome: Progressing   Problem: Cardiac: Goal: Ability to achieve and maintain adequate cardiopulmonary perfusion will improve Outcome: Progressing   Problem: Health Behavior/Discharge Planning: Goal: Ability to safely manage health-related needs after discharge will improve Outcome: Progressing

## 2024-09-02 NOTE — Plan of Care (Signed)
  Problem: Health Behavior/Discharge Planning: Goal: Ability to manage health-related needs will improve Outcome: Progressing   Problem: Clinical Measurements: Goal: Ability to maintain clinical measurements within normal limits will improve Outcome: Progressing Goal: Will remain free from infection Outcome: Progressing Goal: Diagnostic test results will improve Outcome: Progressing Goal: Respiratory complications will improve Outcome: Progressing   Problem: Activity: Goal: Risk for activity intolerance will decrease Outcome: Progressing   Problem: Nutrition: Goal: Adequate nutrition will be maintained Outcome: Progressing   Problem: Coping: Goal: Level of anxiety will decrease Outcome: Progressing

## 2024-09-02 NOTE — Progress Notes (Signed)
   09/02/24 1600  Mobility  Activity Ambulated with assistance;Stood at bedside  Level of Assistance Standby assist, set-up cues, supervision of patient - no hands on  Assistive Device Front wheel walker  Distance Ambulated (ft) 360 ft  Range of Motion/Exercises All extremities  Activity Response Tolerated well  Mobility visit 1 Mobility  Mobility Specialist Start Time (ACUTE ONLY) 1530  Mobility Specialist Stop Time (ACUTE ONLY) 1545  Mobility Specialist Time Calculation (min) (ACUTE ONLY) 15 min   Pt was supine in the bed resting upon entry. Pt agreed to mobility. Pt was able to EOB and STS independently. Pt ambulated well with CGA. Pt continues to shake a bit during ambulation. Pt spatial awareness needs work identifying certain object within his path.  [name] Mobility Specialist 09/02/24, 4:15 PM

## 2024-09-02 NOTE — Progress Notes (Signed)
   09/02/24 1400  Mobility  Activity Ambulated with assistance;Stood at bedside;Respositioned in chair  Level of Assistance Standby assist, set-up cues, supervision of patient - no hands on  Assistive Device Front wheel walker  Distance Ambulated (ft) 360 ft  Range of Motion/Exercises Active Assistive  Activity Response Tolerated well  Mobility visit 1 Mobility  Mobility Specialist Start Time (ACUTE ONLY) 1255  Mobility Specialist Stop Time (ACUTE ONLY) 1305  Mobility Specialist Time Calculation (min) (ACUTE ONLY) 10 min   Mobility Specialist - Progress Note Pt was in the chair upon entry. Pt agreed to mobility. Pt was able to STS independently. Pt ambulated with with minor shakes a good endurance. Pt after activity repositioned back in chair. Needs are in reach.  Clem Rodes Mobility Specialist 09/02/24, 3:02 PM

## 2024-09-03 ENCOUNTER — Inpatient Hospital Stay

## 2024-09-03 DIAGNOSIS — E875 Hyperkalemia: Secondary | ICD-10-CM | POA: Diagnosis not present

## 2024-09-03 DIAGNOSIS — N1330 Unspecified hydronephrosis: Secondary | ICD-10-CM | POA: Diagnosis not present

## 2024-09-03 LAB — BASIC METABOLIC PANEL WITH GFR
Anion gap: 7 (ref 5–15)
BUN: 34 mg/dL — ABNORMAL HIGH (ref 8–23)
CO2: 26 mmol/L (ref 22–32)
Calcium: 9.3 mg/dL (ref 8.9–10.3)
Chloride: 100 mmol/L (ref 98–111)
Creatinine, Ser: 2.21 mg/dL — ABNORMAL HIGH (ref 0.61–1.24)
GFR, Estimated: 32 mL/min — ABNORMAL LOW (ref >60)
Glucose, Bld: 81 mg/dL (ref 70–99)
Potassium: 4.4 mmol/L (ref 3.5–5.1)
Sodium: 133 mmol/L — ABNORMAL LOW (ref 135–145)

## 2024-09-03 LAB — CBC
HCT: 27.9 % — ABNORMAL LOW (ref 39.0–52.0)
Hemoglobin: 9.5 g/dL — ABNORMAL LOW (ref 13.0–17.0)
MCH: 32.3 pg (ref 26.0–34.0)
MCHC: 34.1 g/dL (ref 30.0–36.0)
MCV: 94.9 fL (ref 80.0–100.0)
Platelets: 165 K/uL (ref 150–400)
RBC: 2.94 MIL/uL — ABNORMAL LOW (ref 4.22–5.81)
RDW: 17.9 % — ABNORMAL HIGH (ref 11.5–15.5)
WBC: 5.5 K/uL (ref 4.0–10.5)
nRBC: 0 % (ref 0.0–0.2)

## 2024-09-03 MED ORDER — PROPRANOLOL HCL 10 MG PO TABS
10.0000 mg | ORAL_TABLET | Freq: Two times a day (BID) | ORAL | Status: DC
  Administered 2024-09-03 – 2024-09-04 (×3): 10 mg via ORAL
  Filled 2024-09-03 (×5): qty 1

## 2024-09-03 MED ORDER — SODIUM CHLORIDE 0.9 % IV SOLN: INTRAVENOUS | Status: AC

## 2024-09-03 NOTE — Progress Notes (Signed)
 Due to severe tremors, whenever the pt has to urinate we help him to stand at bedside and hold the urinal for him and he is able to pee with no problems or spillage.

## 2024-09-03 NOTE — Plan of Care (Signed)

## 2024-09-03 NOTE — Progress Notes (Signed)
 Occupational Therapy Treatment Patient Details Name: [name] MRN: [id number] DOB: 06/27/1961 Today's Date: 09/03/2024   History of present illness Macauley Mossberg is a 63 y.o. male  with a past medical history of paroxysmal atrial fibrillation, seizures, hypertension, hyperlipidemia, CKD who presented to the ED on 08/26/2024 for intermittent palpitations a/w SOB for the past few days.  On arrival to ED patient found to be in atrial fibrillation RVR rate in the 140s. IV Cardizem  initiated, led to hypotension requiring pressors. MD assessment includes: A-fib with RVR, hyperkalemia, DOE, myocardial ischemia, acute respiratory failure, and acute renal failure.   OT comments  Chart reviewed to date, pt greeted in bed, alert and oriented x4, agreeable to OT tx session targeting improving functional activity tolerance in prep for ADL tasks. Pt amb into bathroom with RW with CGA-MIN A, toileting in sitting on commode with supervision, STS with MIN A, then amb back to bed with RW with HR into 170s bpm. Further mobility deferred at this time. Pt reports he will not consider rehab and will go home. He reports he has been able to feed himself better. He appears more impulsive on this date. Nurse notified of pt status and pt is left as received, all needs met. HR to 100s bpm after approx 2-3 minutes of rest. OT will continue to follow.       If plan is discharge home, recommend the following:  A lot of help with bathing/dressing/bathroom;A little help with walking and/or transfers   Equipment Recommendations  BSC/3in1    Recommendations for Other Services      Precautions / Restrictions Precautions Precautions: Fall Recall of Precautions/Restrictions: Impaired Restrictions Weight Bearing Restrictions Per Provider Order: No       Mobility Bed Mobility Overal bed mobility: Modified Independent                  Transfers Overall transfer level: Needs assistance Equipment used:  Rolling walker (2 wheels) Transfers: Sit to/from Stand Sit to Stand: Min assist                 Balance Overall balance assessment: Needs assistance Sitting-balance support: Feet supported Sitting balance-Leahy Scale: Good     Standing balance support: Bilateral upper extremity supported, Reliant on assistive device for balance, During functional activity Standing balance-Leahy Scale: Poor                             ADL either performed or assessed with clinical judgement   ADL Overall ADL's : Needs assistance/impaired                     Lower Body Dressing: Maximal assistance   Toilet Transfer: Minimal assistance;Ambulation;Regular Social worker and Hygiene: Supervision/safety;Sitting/lateral lean Toileting - Clothing Manipulation Details (indicate cue type and reason): continent urine sitting on toilet     Functional mobility during ADLs: Contact guard assist;Minimal assistance;Rolling walker (2 wheels) (approx 15')      Extremity/Trunk Assessment Upper Extremity Assessment RUE Deficits / Details: continued intention tremors LUE Deficits / Details: continued intention tremors            Vision Baseline Vision/History: 1 Wears glasses     Perception     Praxis     Communication Communication Communication: No apparent difficulties   Cognition Arousal: Alert Behavior During Therapy: Impulsive Cognition: No family/caregiver present to determine baseline, Cognition impaired  Attention impairment (select first level of impairment): Selective attention Executive functioning impairment (select all impairments): Problem solving OT - Cognition Comments: ?awareness of current level of functioning                 Following commands: Impaired Following commands impaired: Follows one step commands with increased time      Cueing   Cueing Techniques: Verbal cues, Visual cues  Exercises Other  Exercises Other Exercises: edu re role of OT, role of rehab, discharge recommendation, safe ADL completion at home    Shoulder Instructions       General Comments HR to 170 bpm with transfer from toilet, amb into room approx 10' down to 100 bpm after approx 2-3 min    Pertinent Vitals/ Pain       Pain Assessment Pain Assessment: No/denies pain  Home Living                                          Prior Functioning/Environment              Frequency  Min 2X/week        Progress Toward Goals  OT Goals(current goals can now be found in the care plan section)  Progress towards OT goals: Progressing toward goals  Acute Rehab OT Goals Time For Goal Achievement: 09/11/24  Plan      Co-evaluation                 AM-PAC OT 6 Clicks Daily Activity     Outcome Measure   Help from another person eating meals?: A Little Help from another person taking care of personal grooming?: A Little Help from another person toileting, which includes using toliet, bedpan, or urinal?: A Lot Help from another person bathing (including washing, rinsing, drying)?: A Lot Help from another person to put on and taking off regular upper body clothing?: A Little Help from another person to put on and taking off regular lower body clothing?: A Lot 6 Click Score: 15    End of Session Equipment Utilized During Treatment: Rolling walker (2 wheels)  OT Visit Diagnosis: Other abnormalities of gait and mobility (R26.89);Muscle weakness (generalized) (M62.81)   Activity Tolerance Patient tolerated treatment well   Patient Left in bed;with call bell/phone within reach;with bed alarm set   Nurse Communication Mobility status (HR)        Time: 8486-8473 OT Time Calculation (min): 13 min  Charges: OT General Charges $OT Visit: 1 Visit OT Treatments $Therapeutic Activity: 8-22 mins  Therisa Sheffield, OTD OTR/L  09/03/24, 3:36 PM

## 2024-09-03 NOTE — Progress Notes (Addendum)
   09/03/24 0900  Oxygen Therapy  SpO2 100 %  O2 Device Room Air  Mobility  Activity Ambulated with assistance;Respositioned in chair  Level of Assistance Standby assist, set-up cues, supervision of patient - no hands on  Assistive Device Front wheel walker  Distance Ambulated (ft) 360 ft  Range of Motion/Exercises Active Assistive;All extremities  Activity Response Tolerated well  Mobility visit 1 Mobility  Mobility Specialist Start Time (ACUTE ONLY) 0901  Mobility Specialist Stop Time (ACUTE ONLY) 0912  Mobility Specialist Time Calculation (min) (ACUTE ONLY) 11 min   Mobility Specialist - Progress Note  Pt was in the chair upon entry. Pt agreed to mobility. Pt was able to STS without CGA. Before activity pt was able to go to the bathroom with 2 WW with stand by assist. Afterwards pt ambulated well. Pt does at times shake but able to maintain control while ambulating. Pt had good spatial awareness and cognitive function. Pt repositioned in chair with alarm on and needs in reach.  Clem Rodes Mobility Specialist 09/03/24, 9:54 AM

## 2024-09-03 NOTE — Progress Notes (Signed)
 Notified cardiology and Dr. Jerelene that pt's heart rate goes up to 140's when ambulating in hall but that he's asymptomatic.

## 2024-09-03 NOTE — Progress Notes (Signed)
 Physical Therapy Treatment Patient Details Name: [name] MRN: [id number] DOB: 11-06-1961 Today's Date: 09/03/2024   History of Present Illness [name] is a 63 y.o. male  with a past medical history of paroxysmal atrial fibrillation, seizures, hypertension, hyperlipidemia, CKD who presented to the ED on 08/26/2024 for intermittent palpitations a/w SOB for the past few days.  On arrival to ED patient found to be in atrial fibrillation RVR rate in the 140s. IV Cardizem  initiated, led to hypotension requiring pressors. MD assessment includes: A-fib with RVR, hyperkalemia, DOE, myocardial ischemia, acute respiratory failure, and acute renal failure.    PT Comments  Pt agreed to limited session only this session secondary to fatigue and desire to take a nap.  Pt required no physical assistance with bed mobility tasks but did impulsively try to stand from the EOB prior to having the RW in front of him and required min A to prevent posterior LOB.  Pt was able to amb 180+ feet this session with the RW with min A needed on one occasion to again prevent posterior LOB.  During stair training pt able to ascend and descend 4 steps with one rail with no physical assist but continued to present with mild posterior lean, most notably while descending the steps, but was able to self-correct with the railing.  Pt remains at an elevated risk for falls and will benefit from continued PT services upon discharge to safely address deficits listed in patient problem list for decreased caregiver assistance and eventual return to PLOF.      If plan is discharge home, recommend the following: A little help with walking and/or transfers;A lot of help with bathing/dressing/bathroom;Assistance with cooking/housework;Assist for transportation;Help with stairs or ramp for entrance   Can travel by private vehicle     Yes  Equipment Recommendations  Other (comment) (TBD)    Recommendations for Other Services        Precautions / Restrictions Precautions Precautions: Fall Recall of Precautions/Restrictions: Impaired Restrictions Weight Bearing Restrictions Per Provider Order: No     Mobility  Bed Mobility Overal bed mobility: Modified Independent             General bed mobility comments: Min increased time/effort required but no physical assistance needed    Transfers Overall transfer level: Needs assistance Equipment used: None, Rolling walker (2 wheels) Transfers: Sit to/from Stand Sit to Stand: Contact guard assist, Min assist           General transfer comment: One instance of min A required to prevent posterior LOB upon initial stand without an AD    Ambulation/Gait Ambulation/Gait assistance: Min assist Gait Distance (Feet): 180 Feet Assistive device: Rolling walker (2 wheels) Gait Pattern/deviations: Step-through pattern, Decreased step length - right, Decreased step length - left, Drifts right/left Gait velocity: decreased     General Gait Details: Min reduced cadence and bilateral step length with one instance of min A required to prevent posterior LOB   Stairs Stairs: Yes Stairs assistance: Contact guard assist Stair Management: One rail Right, Alternating pattern, Forwards Number of Stairs: 4 General stair comments: Min posterior lean while descending steps but able to self-correct with use of one rail, close CGA provided   Wheelchair Mobility     Tilt Bed    Modified Rankin (Stroke Patients Only)       Balance Overall balance assessment: Needs assistance Sitting-balance support: Feet supported Sitting balance-Leahy Scale: Good     Standing balance support: Bilateral upper extremity supported, [place]  on assistive device for balance, During functional activity Standing balance-Leahy Scale: Poor                              Communication Communication Communication: No apparent difficulties  Cognition Arousal: Alert Behavior  During Therapy: Impulsive   PT - Cognitive impairments: No family/caregiver present to determine baseline                         Following commands: Impaired Following commands impaired: Follows one step commands with increased time    Cueing Cueing Techniques: Verbal cues, Visual cues  Exercises      General Comments        Pertinent Vitals/Pain Pain Assessment Pain Assessment: No/denies pain    Home Living                          Prior Function            PT Goals (current goals can now be found in the care plan section) Progress towards PT goals: Progressing toward goals    Frequency    Min 2X/week      PT Plan      Co-evaluation              AM-PAC PT 6 Clicks Mobility   Outcome Measure  Help needed turning from your back to your side while in a flat bed without using bedrails?: None Help needed moving from lying on your back to sitting on the side of a flat bed without using bedrails?: None Help needed moving to and from a bed to a chair (including a wheelchair)?: A Little Help needed standing up from a chair using your arms (e.g., wheelchair or bedside chair)?: A Little Help needed to walk in hospital room?: A Little Help needed climbing 3-5 steps with a railing? : A Little 6 Click Score: 20    End of Session Equipment Utilized During Treatment: Gait belt Activity Tolerance: Patient tolerated treatment well Patient left: in bed;with call bell/phone within reach;with bed alarm set;Other (comment) (pt declined up in chair) Nurse Communication: Mobility status PT Visit Diagnosis: Unsteadiness on feet (R26.81);Other abnormalities of gait and mobility (R26.89);Muscle weakness (generalized) (M62.81);History of falling (Z91.81);Difficulty in walking, not elsewhere classified (R26.2);Dizziness and giddiness (R42)     Time: 8598-8581 PT Time Calculation (min) (ACUTE ONLY): 17 min  Charges:    $Gait Training: 8-22 mins PT  General Charges $$ ACUTE PT VISIT: 1 Visit                    D. Scott Tanette Chauca PT, DPT 09/03/24, 2:30 PM

## 2024-09-03 NOTE — Progress Notes (Signed)
 Progress Note   Patient: [name] FMW:[id number] DOB: 1961-10-17 DOA: 08/26/2024     7 DOS: the patient was seen and examined on 09/03/2024   Brief hospital course: 63 yo M presenting to [facility] ED from home via EMS for evaluation of chest pain.   Patient was in his normal state of health until 08/23/24 when he felt something hard squeezing my chest as he was walking back to the house while taking out the trash. He was not sure if this was his A-fib or a sign of a heart attack and self-administered propanolol which he takes PRN for palpitations and lay down. His chest pain improved however over the next few days he experienced generalized fatigue with dyspnea on exertion as well as increased lower extremity edema. He called EMS was he was unable to to bear his own weight. He denies fever/ chills, abdominal pain/ nausea/ vomiting/ diarrhea, urinary symptoms, new cough/ congestion, signs of bleeding, falls or LOC.    Assessment and Plan: Hyperkalemia - resolved Continue Lokelma .  Placed on renal diet.  Renal diet list to go home with.   Shock circulatory (HCC) Secondary to rapid atrial fibrillation and propranolol .  Off Neo-Synephrine.  On midodrine .  No signs of bleeding.   Atrial flutter with rapid ventricular response (HCC) Discontinued beta-blockers due to sinus bradycardia, intermittently with activity HR elevated to 140s Discussed with cardiology, resume propranolol  10 mg bid, will uptitrate as tolerated Off systemic anticoagulation due to anemia.  EF 60% Follow-up outpatient with EP   Dyspnea on exertion Secondary to anemia and/or fast heart rate   Chest pain due to myocardial ischemia Demand ischemia with fast heart rate   Acute respiratory failure with hypoxia (HCC) - resolved Was reportedly hypoxic to the 80s with EMS improved with O2 at 4 L.  This problem has resolved and off oxygen.   Acute on chronic anemia of CKD lllb Received 1 unit of packed red blood cells.   Ferritin elevated going along with anemia of chronic disease.  Last hemoglobin 9.5.  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 1.53 Gentle IV fluids, encourage p.o. intake No retention on bladder scan, US  kidneys no hydronephrosis Monitor Cr   Acute urinary retention Secondary to BPH.  On Flomax  and Proscar .  Foley removed 09/16 Able to void   Hyperparathyroidism (HCC) Recent PTH intact elevated at 179 Being managed outpatient by nephrology   Seizure disorder (HCC) Continue Depakote  and Keppra    Essential tremor Continue primidone  Resume propranolol  at lower dose   Dispo: Patient was recommended to discharge to SNF per PT, unsafe to go home and high risk of falls Patient insisting on going home with home services, has decision-making capacity    Subjective: Patient states he feels much better today, improvement in frequency of voiding. States had poor p.o. intake yesterday as he was worried that he has to wake up multiple times during the night to void.  Encourage p.o. intake Anticipate discharge tomorrow if creatinine improves  Physical Exam: Vitals:   09/03/24 0331 09/03/24 0821 09/03/24 0900 09/03/24 1130  BP: 108/70 (!) 147/74  126/71  Pulse: 73 93  68  Resp: 17 18  19   Temp: 98.2 F (36.8 C) 97.8 F (36.6 C)  98.4 F (36.9 C)  TempSrc:      SpO2: 98% 100% 100% 97%  Weight:  Height:       Physical Exam HENT:     Head: Normocephalic.  Eyes:     General: Lids are normal.     Conjunctiva/sclera: Conjunctivae normal.  Cardiovascular:     Rate and Rhythm: Regular rhythm. Bradycardia present.     Heart sounds: Normal heart sounds, S1 normal and S2 normal.  Pulmonary:     Breath sounds: No decreased breath sounds, wheezing, rhonchi or rales.  Abdominal:     Palpations: Abdomen is soft.     Tenderness: There is no abdominal tenderness.   Musculoskeletal:     Right lower leg: No swelling.     Left lower leg: No swelling.  Skin:    General: Skin is warm.     Findings: No rash.  Neurological:     Mental Status: He is alert.     Data Reviewed: Labs reviewed  Disposition: Status is: Inpatient Remains inpatient appropriate because: AKI on CKD   Planned Discharge Destination: Home with Home Health (patient refusing rehab)    Time spent: 50 minutes  Author: Laree Lock, MD 09/03/2024 3:26 PM  For on call review [email or web address].

## 2024-09-04 DIAGNOSIS — E875 Hyperkalemia: Secondary | ICD-10-CM | POA: Diagnosis not present

## 2024-09-04 LAB — BASIC METABOLIC PANEL WITH GFR
Anion gap: 11 (ref 5–15)
BUN: 59 mg/dL — ABNORMAL HIGH (ref 8–23)
CO2: 27 mmol/L (ref 22–32)
Calcium: 9.5 mg/dL (ref 8.9–10.3)
Chloride: 97 mmol/L — ABNORMAL LOW (ref 98–111)
Creatinine, Ser: 2.06 mg/dL — ABNORMAL HIGH (ref 0.61–1.24)
GFR, Estimated: 35 mL/min — ABNORMAL LOW (ref >60)
Glucose, Bld: 84 mg/dL (ref 70–99)
Potassium: 4.4 mmol/L (ref 3.5–5.1)
Sodium: 135 mmol/L (ref 135–145)

## 2024-09-04 MED ORDER — SODIUM CHLORIDE 0.9 % IV SOLN: INTRAVENOUS | Status: AC

## 2024-09-04 NOTE — Progress Notes (Signed)
 Physical Therapy Treatment Patient Details Name: [name] MRN: [id number] DOB: 12/03/1961 Today's Date: 09/04/2024   History of Present Illness [name] is a 63 y.o. male  with a past medical history of paroxysmal atrial fibrillation, seizures, hypertension, hyperlipidemia, CKD who presented to the ED on 08/26/2024 for intermittent palpitations a/w SOB for the past few days.  On arrival to ED patient found to be in atrial fibrillation RVR rate in the 140s. IV Cardizem  initiated, led to hypotension requiring pressors. MD assessment includes: A-fib with RVR, hyperkalemia, DOE, myocardial ischemia, acute respiratory failure, and acute renal failure.    PT Comments  Pt resting in bed on entry, is agreeable and motivated to partake in PT session today. Pt able to demonstrate improvement in overall distance AMB, also shows good power and balance with 5xSTS from EOB. Pt has some ataxia and safety awareness impairment that are largely baseline, present unique challenges with overground AMB, particularly while IV is running. HR mi 80s during session. Will continue to follow.    If plan is discharge home, recommend the following: A little help with walking and/or transfers;A lot of help with bathing/dressing/bathroom;Assistance with cooking/housework;Assist for transportation;Help with stairs or ramp for entrance   Can travel by private vehicle     Yes  Equipment Recommendations  None recommended by PT    Recommendations for Other Services       Precautions / Restrictions Precautions Precautions: Fall Recall of Precautions/Restrictions: Impaired Restrictions Weight Bearing Restrictions Per Provider Order: No     Mobility  Bed Mobility Overal bed mobility: Needs Assistance Bed Mobility: Supine to Sit, Sit to Supine     Supine to sit: Supervision Sit to supine: Supervision   General bed mobility comments: appears labored and somewaht weak, warrants some supervision given his IV  access    Transfers Overall transfer level: Needs assistance Equipment used: Rolling walker (2 wheels) Transfers: Sit to/from Stand Sit to Stand: Supervision, Contact guard assist           General transfer comment: with some ataxia which I suspect is also chronic, similar to his chronic gait ataxia.    Ambulation/Gait Ambulation/Gait assistance: Supervision, Contact guard assist Gait Distance (Feet): 240 Feet Assistive device: Rolling walker (2 wheels)         General Gait Details: baseline gait ataxia, noted RUE tremor without any frank related impairment. recurrent drifting to the right with eventual improved self awareness and self correction over final 50% of activity.   Stairs             Wheelchair Mobility     Tilt Bed    Modified Rankin (Stroke Patients Only)       Balance                                            Communication    Cognition Arousal: Alert Behavior During Therapy: WFL for tasks assessed/performed   PT - Cognitive impairments: No family/caregiver present to determine baseline, History of cognitive impairments                       PT - Cognition Comments: A&Ox4        Cueing    Exercises Other Exercises Other Exercises: 5xSTS EOB, no hands, ataxic but with generally good power    General Comments  Pertinent Vitals/Pain Pain Assessment Pain Assessment: No/denies pain    Home Living                          Prior Function            PT Goals (current goals can now be found in the care plan section) Acute Rehab PT Goals Patient Stated Goal: to get stronger PT Goal Formulation: With patient Time For Goal Achievement: 09/11/24 Potential to Achieve Goals: Fair    Frequency    Min 2X/week      PT Plan      Co-evaluation              AM-PAC PT 6 Clicks Mobility   Outcome Measure  Help needed turning from your back to your side while in a flat bed  without using bedrails?: A Little Help needed moving from lying on your back to sitting on the side of a flat bed without using bedrails?: A Little Help needed moving to and from a bed to a chair (including a wheelchair)?: A Little Help needed standing up from a chair using your arms (e.g., wheelchair or bedside chair)?: A Little Help needed to walk in hospital room?: A Little Help needed climbing 3-5 steps with a railing? : A Little 6 Click Score: 18    End of Session Equipment Utilized During Treatment: Gait belt Activity Tolerance: Patient tolerated treatment well;No increased pain Patient left: in bed;with call bell/phone within reach;with bed alarm set Nurse Communication: Mobility status PT Visit Diagnosis: Unsteadiness on feet (R26.81);Other abnormalities of gait and mobility (R26.89);Muscle weakness (generalized) (M62.81);History of falling (Z91.81);Difficulty in walking, not elsewhere classified (R26.2);Dizziness and giddiness (R42)     Time: 1216-1228 PT Time Calculation (min) (ACUTE ONLY): 12 min  Charges:    $Therapeutic Activity: 8-22 mins PT General Charges $$ ACUTE PT VISIT: 1 Visit                    1:45 PM, 09/04/24 [name], PT, DPT Physical Therapist - [facility]  [phone] (ASCOM)    [name] C 09/04/2024, 1:43 PM

## 2024-09-04 NOTE — Progress Notes (Signed)
 Pt alert, oriented to room; transfer from 2A; pt has urgency voiding wet the floor few times; had a bm. HR irregular, BP soft sometimes. Continue to monitor.

## 2024-09-04 NOTE — TOC Progression Note (Signed)
 Transition of Care [facility]) - Progression Note    Patient Details  Name: [name] MRN: [id number] Date of Birth: 1961-01-07  Transition of Care [facility]) CM/SW Contact  Alfonso Rummer, LCSW Phone Number: 09/04/2024, 4:35 PM  Clinical Narrative:     KEN DELENA Rummer met with pt in room 114. Pt advise he already has a 3 in 1 commode, along with transfer bench, and grab bars. Pt prefers home health and adamant about not transiting to snf. [facility] already informed regarding pt                     Expected Discharge Plan and Services                                               Social Drivers of Health (SDOH) Interventions SDOH Screenings   Food Insecurity: No Food Insecurity (08/31/2024)  Housing: Low Risk  (08/31/2024)  Transportation Needs: No Transportation Needs (08/31/2024)  Utilities: Not At Risk (08/31/2024)  Financial Resource Strain: Low Risk  (07/10/2023)   Received from [facility]  Social Connections: Unknown (08/31/2024)  Tobacco Use: Low Risk  (08/26/2024)    Readmission Risk Interventions     No data to display

## 2024-09-04 NOTE — Progress Notes (Addendum)
 Progress Note   Patient: [name] FMW:[id number] DOB: 04-27-1961 DOA: 08/26/2024     8 DOS: the patient was seen and examined on 09/04/2024   Brief hospital course: 63 yo M presenting to [facility] ED from home via EMS for evaluation of chest pain.   Patient was in his normal state of health until 08/23/24 when he felt something hard squeezing my chest as he was walking back to the house while taking out the trash. He was not sure if this was his A-fib or a sign of a heart attack and self-administered propanolol which he takes PRN for palpitations and lay down. His chest pain improved however over the next few days he experienced generalized fatigue with dyspnea on exertion as well as increased lower extremity edema. He called EMS was he was unable to to bear his own weight. He denies fever/ chills, abdominal pain/ nausea/ vomiting/ diarrhea, urinary symptoms, new cough/ congestion, signs of bleeding, falls or LOC.    Assessment and Plan: Hyperkalemia - resolved Continue Lokelma .  Placed on renal diet   Shock circulatory (HCC) Secondary to rapid atrial fibrillation and propranolol .  Off Neo-Synephrine.  On midodrine .  No signs of bleeding.   Atrial flutter with rapid ventricular response (HCC) Discontinued beta-blockers due to sinus bradycardia, intermittently with activity HR elevated to 140s Discussed with cardiology, resume propranolol  10 mg bid 09/18, will uptitrate as tolerated Off systemic anticoagulation due to anemia.  EF 60% Follow-up outpatient with EP   Dyspnea on exertion Secondary to anemia and/or fast heart rate   Chest pain due to myocardial ischemia Demand ischemia with fast heart rate   Acute respiratory failure with hypoxia (HCC) - resolved Was reportedly hypoxic to the 80s with EMS improved with O2 at 4 L.  This problem has resolved and off oxygen.   Acute on chronic anemia of CKD lllb Received 1 unit of packed red blood cells.  Ferritin elevated going along  with anemia of chronic disease.  Last hemoglobin 9.5.  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 1.53 Continue Gentle IV fluids, encourage p.o. intake No retention on bladder scan, US  kidneys no hydronephrosis Monitor Cr   Acute urinary retention Secondary to BPH.  On Flomax  and Proscar .  Foley removed 09/16 Able to void   Hyperparathyroidism (HCC) Recent PTH intact elevated at 179 Being managed outpatient by nephrology   Seizure disorder (HCC) Continue Depakote  and Keppra    Essential tremor Continue primidone  Resume propranolol  at lower dose   Dispo: Patient was recommended to discharge to SNF per PT, unsafe to go home and high risk of falls Patient insisting on going home with home services, has decision-making capacity    Subjective: Patient states he feels much better today, improved p.o. intake Discussed that creatinine has improved, will continue gentle IV fluids. Anticipate discharge tomorrow if creatinine improves   Physical Exam: Vitals:   09/04/24 0450 09/04/24 0600 09/04/24 0944 09/04/24 1341  BP: (!) 92/52 (!) 125/105 (!) 100/50 (!) 95/57  Pulse: (!) 50 (!) 50 68 (!) 54  Resp: 16  17   Temp: 97.9 F (36.6 C) 97.9 F (36.6 C) 99.2 F (37.3 C) 98.1 F (36.7 C)  TempSrc:   Oral Oral  SpO2: 99% 98% 98% 96%  Weight:      Height:       Physical Exam HENT:  Head: Normocephalic.  Eyes:     General: Lids are normal.     Conjunctiva/sclera: Conjunctivae normal.  Cardiovascular:     Rate and Rhythm: Regular rhythm. Bradycardia present.     Heart sounds: Normal heart sounds, S1 normal and S2 normal.  Pulmonary:     Breath sounds: No decreased breath sounds, wheezing, rhonchi or rales.  Abdominal:     Palpations: Abdomen is soft.     Tenderness: There is no abdominal tenderness.  Musculoskeletal:     Right lower leg: No swelling.      Left lower leg: No swelling.  Skin:    General: Skin is warm.     Findings: No rash.  Neurological:     Mental Status: He is alert.     Data Reviewed: Labs reviewed  Disposition: Status is: Inpatient Remains inpatient appropriate because: AKI on CKD   Planned Discharge Destination: Home with Home Health (patient refusing rehab)    Time spent: 35 minutes  Author: Laree Lock, MD 09/04/2024 3:58 PM  For on call review [email or web address].

## 2024-09-04 NOTE — Progress Notes (Signed)
 Pt alert and oriented;  BP this morning was 92/52 ; HR 50; Pt asymptomatic. MD notified.

## 2024-09-04 NOTE — Plan of Care (Signed)

## 2024-09-05 DIAGNOSIS — E875 Hyperkalemia: Secondary | ICD-10-CM | POA: Diagnosis not present

## 2024-09-05 LAB — BASIC METABOLIC PANEL WITH GFR
Anion gap: 15 (ref 5–15)
BUN: 48 mg/dL — ABNORMAL HIGH (ref 8–23)
CO2: 26 mmol/L (ref 22–32)
Calcium: 9.6 mg/dL (ref 8.9–10.3)
Chloride: 96 mmol/L — ABNORMAL LOW (ref 98–111)
Creatinine, Ser: 1.99 mg/dL — ABNORMAL HIGH (ref 0.61–1.24)
GFR, Estimated: 37 mL/min — ABNORMAL LOW (ref >60)
Glucose, Bld: 81 mg/dL (ref 70–99)
Potassium: 4.4 mmol/L (ref 3.5–5.1)
Sodium: 137 mmol/L (ref 135–145)

## 2024-09-05 MED ORDER — MIDODRINE HCL 10 MG PO TABS: 10.0000 mg | ORAL_TABLET | Freq: Three times a day (TID) | ORAL | 1 refills | Status: AC

## 2024-09-05 MED ORDER — PRIMIDONE 50 MG PO TABS: 50.0000 mg | ORAL_TABLET | Freq: Three times a day (TID) | ORAL | 1 refills | Status: AC

## 2024-09-05 MED ORDER — FINASTERIDE 5 MG PO TABS: 5.0000 mg | ORAL_TABLET | Freq: Every day | ORAL | 1 refills | Status: AC

## 2024-09-05 MED ORDER — PROPRANOLOL HCL 10 MG PO TABS: 10.0000 mg | ORAL_TABLET | Freq: Two times a day (BID) | ORAL | 1 refills | Status: AC

## 2024-09-05 MED ORDER — FOLIC ACID 1 MG PO TABS: 1.0000 mg | ORAL_TABLET | Freq: Every day | ORAL | 1 refills | Status: AC

## 2024-09-05 MED ORDER — SODIUM ZIRCONIUM CYCLOSILICATE 5 G PO PACK
5.0000 g | PACK | Freq: Every day | ORAL | Status: DC
Start: 2024-09-06 — End: 2024-09-05

## 2024-09-05 MED ORDER — SODIUM ZIRCONIUM CYCLOSILICATE 5 G PO PACK: 5.0000 g | PACK | Freq: Every day | ORAL | 0 refills | Status: AC

## 2024-09-05 NOTE — Plan of Care (Signed)
  Problem: Clinical Measurements: Goal: Ability to maintain clinical measurements within normal limits will improve Outcome: Progressing   Problem: Activity: Goal: Risk for activity intolerance will decrease Outcome: Progressing   Problem: Pain Managment: Goal: General experience of comfort will improve and/or be controlled Outcome: Progressing   Problem: Elimination: Goal: Will not experience complications related to bowel motility Outcome: Progressing   Problem: Safety: Goal: Ability to remain free from injury will improve Outcome: Progressing

## 2024-09-05 NOTE — Discharge Summary (Signed)
 Physician Discharge Summary   Patient: [name] MRN: [id number] DOB: 07/17/1961  Admit date:     08/26/2024  Discharge date: 09/05/24  Discharge Physician: Laree Lock   PCP: Glover Lenis, MD   Recommendations at discharge:   Follow-up with PCP within 5 days -repeat BMP (potassium, creatinine), CBC HR, BP -titrate propranolol , midodrine  and lisinopril  as tolerated  Follow-up with hematology outpatient Follow-up with cardiology outpatient  Home PT/OT/HHA/RN arranged  Discharge Diagnoses: Principal Problem:   Hyperkalemia Active Problems:   Shock circulatory (HCC)   Atrial flutter with rapid ventricular response (HCC)   Chest pain due to myocardial ischemia   Dyspnea on exertion   Acute on chronic anemia of CKD lllb   Acute respiratory failure with hypoxia (HCC)   Acute renal failure superimposed on stage 3b chronic kidney disease (HCC)   Essential tremor   Anemia of chronic renal failure, stage 3b (HCC)   Seizure disorder (HCC)   Hyperparathyroidism (HCC)   Acute urinary retention   Hospital Course: 63 yo M presenting to [facility] ED from home via EMS for evaluation of chest pain.   Patient was in his normal state of health until 08/23/24 when he felt something hard squeezing my chest as he was walking back to the house while taking out the trash. He was not sure if this was his A-fib or a sign of a heart attack and self-administered propanolol which he takes PRN for palpitations and lay down. His chest pain improved however over the next few days he experienced generalized fatigue with dyspnea on exertion as well as increased lower extremity edema. He called EMS was he was unable to to bear his own weight. He denies fever/ chills, abdominal pain/ nausea/ vomiting/ diarrhea, urinary symptoms, new cough/ congestion, signs of bleeding, falls or LOC. Hospital course as below   Assessment and Plan: Hyperkalemia - resolved Low potassium diet Hyperkalemia resolved,  potassium stable on Lokelma  10 g daily Decrease to Lokelma  5 g daily and follow-up with PCP in 5 days   Shock circulatory - resolved Off Neo-Synephrine On midodrine .  Hold lisinopril    Atrial flutter with rapid ventricular response Discontinued beta-blockers due to sinus bradycardia, intermittently with activity HR elevated to 140s Discussed with cardiology, resume propranolol  10 mg bid 09/18, uptitrate as tolerated Off systemic anticoagulation due to anemia.  EF 60% Follow-up outpatient with EP   Dyspnea on exertion - resolved Secondary to anemia and/or fast heart rate   Chest pain due to myocardial ischemia - resolved Demand ischemia with fast heart rate   Acute respiratory failure with hypoxia (HCC) - resolved Was reportedly hypoxic to the 80s with EMS improved with O2 at 4 L.  This problem has resolved and off oxygen.   Acute on chronic anemia of CKD lllb Received 1 unit of packed red blood cells.  Ferritin elevated going along with anemia of chronic disease.  Last hemoglobin 9.5.  Bone marrow biopsy 09/15 (results pending)   AKI on stage 3b chronic kidney disease S/p IV fluids Cr improved and worsened to 2.21 -> 2.06 -> 1.99 Baseline Cr ? 1.53 S/p Gentle IV fluids, encourage p.o. intake No retention on bladder scan, US  kidneys no hydronephrosis Follow-up with PCP for repeat labs  Acute urinary retention - resolved Secondary to BPH.  On Flomax  and Proscar .  Foley removed 09/16 Able to void   Hyperparathyroidism Recent PTH intact elevated at 179 Being managed outpatient by nephrology   Seizure disorder Continue Depakote  and Keppra   Essential tremor Continue primidone  Resume propranolol  at lower dose     -- Patient was recommended to discharge to SNF per PT, unsafe to go home and high risk of falls Patient insisting on going home with home services, has decision-making capacity   Consultants: Cardiology, critical care, hematology Disposition: Home health Diet  recommendation:  Discharge Diet Orders (From admission, onward)     Start     Ordered   09/05/24 0000  Diet - low sodium heart healthy        09/05/24 1336           Low potassium diet  DISCHARGE MEDICATION: Allergies as of 09/05/2024       Reactions   Enoxaparin  Itching   Per pt it makes my eyes steam   Nsaids Other (See Comments)   Kidney disease        Medication List     PAUSE taking these medications    lisinopril  5 MG tablet Wait to take this until your doctor or other care provider tells you to start again. Commonly known as: ZESTRIL  Hold this medicine until followup with outpatient doctor because your blood pressure was normal in the hospital without it.       TAKE these medications    alendronate  70 MG tablet Commonly known as: FOSAMAX  Take 70 mg by mouth once a week. Take with a full glass of water on an empty stomach.   atorvastatin  80 MG tablet Commonly known as: LIPITOR  Take 80 mg by mouth at bedtime.   divalproex  250 MG DR tablet Commonly known as: DEPAKOTE  Take 3 tablets (750 mg total) by mouth every 12 (twelve) hours. What changed: when to take this   feeding supplement Liqd Take 237 mLs by mouth 3 (three) times daily between meals.   finasteride  5 MG tablet Commonly known as: PROSCAR  Take 1 tablet (5 mg total) by mouth daily. Start taking on: September 06, 2024   folic acid  1 MG tablet Commonly known as: FOLVITE  Take 1 tablet (1 mg total) by mouth daily. Start taking on: September 06, 2024   levETIRAcetam  500 MG tablet Commonly known as: KEPPRA  Take 1 tablet (500 mg total) by mouth 2 (two) times daily.   midodrine  10 MG tablet Commonly known as: PROAMATINE  Take 1 tablet (10 mg total) by mouth 3 (three) times daily with meals.   primidone  50 MG tablet Commonly known as: MYSOLINE  Take 1 tablet (50 mg total) by mouth every 8 (eight) hours.   propranolol  10 MG tablet Commonly known as: INDERAL  Take 1 tablet (10 mg total)  by mouth 2 (two) times daily. What changed:  medication strength how much to take how to take this when to take this additional instructions   sodium zirconium cyclosilicate  5 g packet Commonly known as: LOKELMA  Take 5 g by mouth daily for 7 days. Start taking on: September 06, 2024   tamsulosin  0.4 MG Caps capsule Commonly known as: FLOMAX  Take 1 capsule (0.4 mg total) by mouth at bedtime.        Follow-up Information     Ammon Blunt, MD Follow up on 09/10/2024.   Specialty: Cardiology Why: EP: 09/30 at 11 AM  Dr. Ammon : 09/25 at 10:30 AM Contact information: [address] [facility] [place] [address] [phone]                Discharge Exam: Filed Weights   08/31/24 1000 09/01/24 0500 09/04/24 0105  Weight: 59.6 kg 57.6 kg  57.1 kg    Cardiovascular:     Rate and Rhythm: Regular rhythm. Bradycardia present.     Heart sounds: Normal heart sounds, S1 normal and S2 normal.  Pulmonary:     Breath sounds: No decreased breath sounds, wheezing, rhonchi or rales.  Abdominal:     Palpations: Abdomen is soft.     Tenderness: There is no abdominal tenderness.  Musculoskeletal:     Right lower leg: No swelling.     Left lower leg: No swelling.  Skin:    General: Skin is warm.     Findings: No rash.  Neurological:     Mental Status: He is alert.  Condition at discharge: fair  The results of significant diagnostics from this hospitalization (including imaging, microbiology, ancillary and laboratory) are listed below for reference.   Imaging Studies: US  RENAL Result Date: 09/03/2024 CLINICAL DATA:  Hydronephrosis EXAM: RENAL / URINARY TRACT ULTRASOUND COMPLETE COMPARISON:  11/26/2013 FINDINGS: Right Kidney: Renal measurements: 10.3 x 4.4 x 4.5 cm = volume: 108 mL. No hydronephrosis or mass. Diffusely increased echotexture. Left Kidney: Renal measurements: 9.7 x 5.9 x 5.1 cm = volume: 154 mL. No hydronephrosis. 1.2  cm lower pole medial cyst appears simple. No follow-up imaging recommended. Diffusely increased echotexture. Bladder: Bladder is not fully distended, but posterior wall appears thickened measuring up to 1.2 cm in thickness. Echogenic focus along the anterior bladder wall could reflect a small amount of air and be related to recent catheterization. Other: None IMPRESSION: No hydronephrosis. Diffusely increased echotexture throughout the kidneys compatible with chronic medical renal disease, stable since prior study. Concern for bladder wall thickening posteriorly, although the bladder is not fully distended. Electronically Signed   By: Franky Crease M.D.   On: 09/03/2024 14:13   IR BONE MARROW BIOPSY & ASPIRATION Result Date: 08/31/2024 INDICATION: UNSPECIFIED ANEMIA EXAM: FLUORO GUIDED RIGHT ILIAC BONE MARROW ASPIRATION AND CORE BIOPSY Date:  08/31/2024 08/31/2024 10:44 am Radiologist:  M. Frederic Specking, MD Guidance:  MURTIS FLUOROSCOPY: Fluoroscopy Time: 1 minutes 6 seconds (3.7 mGy). MEDICATIONS: 1% LIDOCAINE  LOCAL ANESTHESIA/SEDATION: 1 mg IV Versed ; 50 mcg IV Fentanyl  Moderate Sedation Time: 10 min The patient was continuously monitored during the procedure by the interventional radiology nurse under my direct supervision. CONTRAST:  none COMPLICATIONS: None PROCEDURE: Informed consent was obtained from the patient following explanation of the procedure, risks, benefits and alternatives. The patient understands, agrees and consents for the procedure. All questions were addressed. A time out was performed. The patient was positioned prone and fluoro localization was performed of the pelvis to demonstrate the iliac marrow spaces. Maximal barrier sterile technique utilized including caps, mask, sterile gowns, sterile gloves, large sterile drape, hand hygiene, and Betadine prep. Under sterile conditions and local anesthesia, an 11 gauge coaxial bone biopsy needle was advanced into the right iliac marrow space. Needle  position was confirmed with fluoro imagng. Initially, bone marrow aspiration was performed. Next, the 11 gauge outer cannula was utilized to obtain a right iliac bone marrow core biopsy. Needle was removed. Hemostasis was obtained with compression. The patient tolerated the procedure well. Samples were prepared with the cytotechnologist. No immediate complications. IMPRESSION: fluoro guided right iliac bone marrow aspiration and core biopsy. Electronically Signed   By: CHRISTELLA.  Shick M.D.   On: 08/31/2024 10:49   ECHOCARDIOGRAM COMPLETE Result Date: 08/27/2024    ECHOCARDIOGRAM REPORT   Patient Name:   [name] Date of Exam: 08/27/2024 Medical Rec #:  [id number]  Height:       63.0 in Accession #:    [id number]       Weight:       130.7 lb Date of Birth:  [birth year]        BSA:          1.614 m Patient Age:    63 years         BP:           88/63 mmHg Patient Gender: M                HR:           49 bpm. Exam Location:  [facility] Procedure: 2D Echo, Cardiac Doppler, Color Doppler and Intracardiac            Opacification Agent (Both Spectral and Color Flow Doppler were            utilized during procedure). Indications:     Atrial Flutter I48.92  History:         Patient has prior history of Echocardiogram examinations, most                  recent 09/17/2020. Arrythmias:Atrial Flutter.  Sonographer:     Ashley McNeely-Sloane Referring Phys:  [id number] DELAYNE LULLA SOLIAN Diagnosing Phys: Marsa Dooms MD IMPRESSIONS  1. Left ventricular ejection fraction, by estimation, is 60 to 65%. The left ventricle has normal function. The left ventricle has no regional wall motion abnormalities. Left ventricular diastolic parameters are indeterminate.  2. Right ventricular systolic function is normal. The right ventricular size is normal.  3. The mitral valve is normal in structure. Trivial mitral valve regurgitation. No evidence of mitral stenosis.  4. The aortic valve is normal in structure. Aortic valve regurgitation is  not visualized. No aortic stenosis is present.  5. The inferior vena cava is normal in size with greater than 50% respiratory variability, suggesting right atrial pressure of 3 mmHg. FINDINGS  Left Ventricle: Left ventricular ejection fraction, by estimation, is 60 to 65%. The left ventricle has normal function. The left ventricle has no regional wall motion abnormalities. Definity  contrast agent was given IV to delineate the left ventricular  endocardial borders. Strain was performed and the global longitudinal strain is indeterminate. The left ventricular internal cavity size was normal in size. There is no left ventricular hypertrophy. Left ventricular diastolic parameters are indeterminate. Right Ventricle: The right ventricular size is normal. No increase in right ventricular wall thickness. Right ventricular systolic function is normal. Left Atrium: Left atrial size was normal in size. Right Atrium: Right atrial size was normal in size. Pericardium: There is no evidence of pericardial effusion. Mitral Valve: The mitral valve is normal in structure. Trivial mitral valve regurgitation. No evidence of mitral valve stenosis. MV peak gradient, 6.2 mmHg. The mean mitral valve gradient is 1.0 mmHg. Tricuspid Valve: The tricuspid valve is normal in structure. Tricuspid valve regurgitation is trivial. No evidence of tricuspid stenosis. Aortic Valve: The aortic valve is normal in structure. Aortic valve regurgitation is not visualized. No aortic stenosis is present. Aortic valve mean gradient measures 2.0 mmHg. Aortic valve peak gradient measures 4.8 mmHg. Aortic valve area, by VTI measures 2.39 cm. Pulmonic Valve: The pulmonic valve was normal in structure. Pulmonic valve regurgitation is not visualized. No evidence of pulmonic stenosis. Aorta: The aortic root is normal in size and structure. Venous: The inferior vena cava is normal in size with greater than 50% respiratory variability, suggesting right atrial  pressure of 3 mmHg. IAS/Shunts: No atrial level shunt detected by color flow Doppler. Additional Comments: 3D was performed not requiring image post processing on an independent workstation and was indeterminate.  LEFT VENTRICLE PLAX 2D LVIDd:         3.60 cm     Diastology LVIDs:         2.20 cm     LV e' medial:    9.99 cm/s LV PW:         0.90 cm     LV E/e' medial:  11.2 LV IVS:        0.80 cm     LV e' lateral:   14.60 cm/s LVOT diam:     1.90 cm     LV E/e' lateral: 7.7 LV SV:         65 LV SV Index:   40 LVOT Area:     2.84 cm  LV Volumes (MOD) LV vol d, MOD A2C: 61.6 ml LV vol d, MOD A4C: 60.7 ml LV vol s, MOD A2C: 25.7 ml LV vol s, MOD A4C: 26.1 ml LV SV MOD A2C:     35.9 ml LV SV MOD A4C:     60.7 ml LV SV MOD BP:      34.9 ml RIGHT VENTRICLE            IVC RV Basal diam:  3.50 cm    IVC diam: 2.30 cm RV Mid diam:    2.60 cm RV S prime:     9.46 cm/s TAPSE (M-mode): 2.3 cm LEFT ATRIUM             Index        RIGHT ATRIUM           Index LA diam:        3.20 cm 1.98 cm/m   RA Area:     12.50 cm LA Vol (A2C):   27.4 ml 16.98 ml/m  RA Volume:   25.10 ml  15.55 ml/m LA Vol (A4C):   43.6 ml 27.01 ml/m LA Biplane Vol: 34.2 ml 21.19 ml/m  AORTIC VALVE                    PULMONIC VALVE AV Area (Vmax):    2.63 cm     PV Vmax:        0.70 m/s AV Area (Vmean):   2.50 cm     PV Vmean:       46.800 cm/s AV Area (VTI):     2.39 cm     PV VTI:         0.156 m AV Vmax:           109.00 cm/s  PV Peak grad:   1.9 mmHg AV Vmean:          71.400 cm/s  PV Mean grad:   1.0 mmHg AV VTI:            0.273 m      RVOT Peak grad: 1 mmHg AV Peak Grad:      4.8 mmHg AV Mean Grad:      2.0 mmHg LVOT Vmax:         101.00 cm/s LVOT Vmean:        62.900 cm/s LVOT VTI:          0.230 m LVOT/AV VTI ratio: 0.84  AORTA Ao Root diam: 3.40 cm MITRAL VALVE  TRICUSPID VALVE MV Area (PHT): 4.80 cm     TR Peak grad:   27.2 mmHg MV Area VTI:   1.97 cm     TR Vmax:        261.00 cm/s MV Peak grad:  6.2 mmHg MV Mean grad:   1.0 mmHg     SHUNTS MV Vmax:       1.25 m/s     Systemic VTI:  0.23 m MV Vmean:      45.9 cm/s    Systemic Diam: 1.90 cm MV Decel Time: 158 msec     Pulmonic VTI:  0.122 m MV E velocity: 112.00 cm/s MV A velocity: 44.50 cm/s MV E/A ratio:  2.52 Marsa Dooms MD Electronically signed by Marsa Dooms MD Signature Date/Time: 08/27/2024/1:26:42 PM    Final    CT Angio Chest PE W/Cm &/Or Wo Cm Result Date: 08/26/2024 EXAM: CTA of the Chest with contrast for PE 08/26/2024 09:05:01 PM TECHNIQUE: CTA of the chest was performed after the administration of intravenous contrast. Multiplanar reformatted images are provided for review. MIP images are provided for review. Automated exposure control, iterative reconstruction, and/or weight based adjustment of the mA/kV was utilized to reduce the radiation dose to as low as reasonably achievable. COMPARISON: 09/23/2020 CLINICAL HISTORY: Pulmonary embolism (PE) suspected, high probability. Patient found on the floor by EMS with chest pain, atrial fibrillation with rapid ventricular response, hypotension, and hypoxia. Initial blood pressure was 74/48. FINDINGS: PULMONARY ARTERIES: Pulmonary arteries are adequately opacified for evaluation. No pulmonary embolism. Main pulmonary artery is normal in caliber. MEDIASTINUM: The heart and pericardium demonstrate no acute abnormality. No pericardial effusion. There is no acute abnormality of the thoracic aorta. Coronary artery and aortic atherosclerotic calcification. LYMPH NODES: No mediastinal, hilar or axillary lymphadenopathy. LUNGS AND PLEURA: The lungs are without acute process. No focal consolidation or pulmonary edema. No pleural effusion or pneumothorax. UPPER ABDOMEN: Limited images of the upper abdomen are unremarkable. SOFT TISSUES AND BONES: No acute bone or soft tissue abnormality. IMPRESSION: 1. No pulmonary embolism. 2. No acute pulmonary abnormality. Electronically signed by: Norman Gatlin MD 08/26/2024 09:10  PM EDT RP Workstation: [id number]   DG Chest 1 View Result Date: 08/26/2024 CLINICAL DATA:  Shortness of breath. EXAM: CHEST  1 VIEW COMPARISON:  Chest radiograph dated 12/17/2021. FINDINGS: The heart size and mediastinal contours are within normal limits. Both lungs are clear. The visualized skeletal structures are unremarkable. IMPRESSION: No active disease. Electronically Signed   By: Vanetta Chou M.D.   On: 08/26/2024 19:59    Microbiology: Results for orders placed or performed during the hospital encounter of 08/26/24  MRSA Next Gen by PCR, Nasal     Status: None   Collection Time: 08/27/24  6:31 AM   Specimen: Nasal Mucosa; Nasal Swab  Result Value Ref Range Status   MRSA by PCR Next Gen NOT DETECTED NOT DETECTED Final    Comment: (NOTE) The GeneXpert MRSA Assay (FDA approved for NASAL specimens only), is one component of a comprehensive MRSA colonization surveillance program. It is not intended to diagnose MRSA infection nor to guide or monitor treatment for MRSA infections. Test performance is not FDA approved in patients less than 76 years old. Performed at [facility], [address] [place], [place] [address]   Culture, blood (Routine X 2) w Reflex to ID Panel     Status: None   Collection Time: 08/27/24 10:05 AM   Specimen: BLOOD  Result Value Ref Range Status  Specimen Description BLOOD BLOOD RIGHT HAND  Final   Special Requests   Final    BOTTLES DRAWN AEROBIC AND ANAEROBIC Blood Culture adequate volume   Culture   Final    NO GROWTH 5 DAYS Performed at [facility], [address] [place], [place] [address]    Report Status 09/01/2024 FINAL  Final  Culture, blood (Routine X 2) w Reflex to ID Panel     Status: None   Collection Time: 08/27/24 10:07 AM   Specimen: BLOOD  Result Value Ref Range Status   Specimen Description BLOOD BLOOD LEFT HAND  Final   Special Requests   Final    BOTTLES DRAWN AEROBIC AND ANAEROBIC Blood Culture  adequate volume   Culture   Final    NO GROWTH 5 DAYS Performed at [facility], [address] [place], [place] [address]    Report Status 09/01/2024 FINAL  Final    Labs: CBC: Recent Labs  Lab 08/30/24 0323 08/31/24 0359 09/01/24 0525 09/03/24 0411  WBC 6.0 6.4  --  5.5  NEUTROABS  --  3.1  --   --   HGB 9.0* 9.5* 9.5* 9.5*  HCT 26.3* 28.2*  --  27.9*  MCV 94.6 95.9  --  94.9  PLT 109* 143*  --  165   Basic Metabolic Panel: Recent Labs  Lab 08/30/24 0323 08/30/24 1439 08/31/24 0359 08/31/24 0416 09/01/24 0525 09/01/24 1656 09/02/24 0428 09/03/24 0411 09/04/24 0402 09/05/24 0541  NA 137   < >  --    < > 132*  --  131* 133* 135 137  K 5.7*   < >  --    < > 5.6* 5.3* 4.7 4.4 4.4 4.4  CL 104   < >  --    < > 96*  --  96* 100 97* 96*  CO2 24   < >  --    < > 25  --  27 26 27 26   GLUCOSE 85   < >  --    < > 83  --  87 81 84 81  BUN 29*   < >  --    < > 30*  --  31* 34* 59* 48*  CREATININE 1.78*   < >  --    < > 2.05*  --  1.89* 2.21* 2.06* 1.99*  CALCIUM  8.9   < >  --    < > 9.6  --  9.5 9.3 9.5 9.6  MG 1.7  --  1.8  --   --   --   --   --   --   --   PHOS 2.7  --   --   --   --   --   --   --   --   --    < > = values in this interval not displayed.   Liver Function Tests: Recent Labs  Lab 08/30/24 0323  ALBUMIN 2.4*   CBG: No results for input(s): GLUCAP in the last 168 hours.  Discharge time spent: greater than 30 minutes.  Signed: Laree Lock, MD [facility] 09/05/2024

## 2024-09-05 NOTE — Progress Notes (Signed)
 Mobility Specialist - Progress Note    09/05/24 1202  Mobility  Activity Ambulated with assistance  Level of Assistance Contact guard assist, steadying assist  Assistive Device None  Distance Ambulated (ft) 320 ft  Range of Motion/Exercises Active  Activity Response Tolerated well  Mobility Referral Yes  Mobility visit 1 Mobility  Mobility Specialist Start Time (ACUTE ONLY) 1104  Mobility Specialist Stop Time (ACUTE ONLY) 1118  Mobility Specialist Time Calculation (min) (ACUTE ONLY) 14 min   Pt resting in bed on RA upon entry. Pt STS and ambulates to hallway around NS for 2 laps with no AD HHA CGA holding. Pt returned to bed and left with needs in reach and chair alarm activated.   Guido Rumble Mobility Specialist 09/05/24, 12:11 PM

## 2024-09-08 ENCOUNTER — Telehealth: Payer: Self-pay | Admitting: *Deleted

## 2024-09-08 NOTE — Telephone Encounter (Signed)
 Patient called making sure that he has transportation through us  to get care.  The first time to be here as a new patient.  Megan scheduler setting up for a transportation and he called back today to make sure that it is all good.  We have to go through [facility] with the transportation and she says that he has Medicaid so he would need to go on the Medicaid bus.  The patient does not know anything about a Medicaid bus.  Samule going to call him and see what they can do for him to get a very, also the patient did say that his car is not working right now and that he will try to get a friend.

## 2024-09-10 ENCOUNTER — Encounter (HOSPITAL_COMMUNITY): Payer: Self-pay

## 2024-09-10 DIAGNOSIS — D649 Anemia, unspecified: Secondary | ICD-10-CM | POA: Diagnosis not present

## 2024-09-10 DIAGNOSIS — E876 Hypokalemia: Secondary | ICD-10-CM | POA: Diagnosis not present

## 2024-09-10 DIAGNOSIS — Z23 Encounter for immunization: Secondary | ICD-10-CM | POA: Diagnosis not present

## 2024-09-10 DIAGNOSIS — I480 Paroxysmal atrial fibrillation: Secondary | ICD-10-CM | POA: Diagnosis not present

## 2024-09-11 DIAGNOSIS — I340 Nonrheumatic mitral (valve) insufficiency: Secondary | ICD-10-CM | POA: Diagnosis not present

## 2024-09-11 DIAGNOSIS — I4892 Unspecified atrial flutter: Secondary | ICD-10-CM | POA: Diagnosis not present

## 2024-09-11 DIAGNOSIS — I4891 Unspecified atrial fibrillation: Secondary | ICD-10-CM | POA: Diagnosis not present

## 2024-09-11 DIAGNOSIS — D631 Anemia in chronic kidney disease: Secondary | ICD-10-CM | POA: Diagnosis not present

## 2024-09-11 DIAGNOSIS — N179 Acute kidney failure, unspecified: Secondary | ICD-10-CM | POA: Diagnosis not present

## 2024-09-11 DIAGNOSIS — E213 Hyperparathyroidism, unspecified: Secondary | ICD-10-CM | POA: Diagnosis not present

## 2024-09-11 DIAGNOSIS — N400 Enlarged prostate without lower urinary tract symptoms: Secondary | ICD-10-CM | POA: Diagnosis not present

## 2024-09-11 DIAGNOSIS — G40909 Epilepsy, unspecified, not intractable, without status epilepticus: Secondary | ICD-10-CM | POA: Diagnosis not present

## 2024-09-11 DIAGNOSIS — N1832 Chronic kidney disease, stage 3b: Secondary | ICD-10-CM | POA: Diagnosis not present

## 2024-09-14 DIAGNOSIS — D631 Anemia in chronic kidney disease: Secondary | ICD-10-CM | POA: Diagnosis not present

## 2024-09-14 DIAGNOSIS — G40909 Epilepsy, unspecified, not intractable, without status epilepticus: Secondary | ICD-10-CM | POA: Diagnosis not present

## 2024-09-14 DIAGNOSIS — N179 Acute kidney failure, unspecified: Secondary | ICD-10-CM | POA: Diagnosis not present

## 2024-09-14 DIAGNOSIS — E213 Hyperparathyroidism, unspecified: Secondary | ICD-10-CM | POA: Diagnosis not present

## 2024-09-14 DIAGNOSIS — I4892 Unspecified atrial flutter: Secondary | ICD-10-CM | POA: Diagnosis not present

## 2024-09-14 DIAGNOSIS — N1832 Chronic kidney disease, stage 3b: Secondary | ICD-10-CM | POA: Diagnosis not present

## 2024-09-14 DIAGNOSIS — I4891 Unspecified atrial fibrillation: Secondary | ICD-10-CM | POA: Diagnosis not present

## 2024-09-15 ENCOUNTER — Other Ambulatory Visit: Payer: Self-pay | Admitting: Oncology

## 2024-09-15 DIAGNOSIS — I340 Nonrheumatic mitral (valve) insufficiency: Secondary | ICD-10-CM | POA: Diagnosis not present

## 2024-09-15 DIAGNOSIS — I4891 Unspecified atrial fibrillation: Secondary | ICD-10-CM | POA: Diagnosis not present

## 2024-09-15 DIAGNOSIS — I4892 Unspecified atrial flutter: Secondary | ICD-10-CM | POA: Diagnosis not present

## 2024-09-15 DIAGNOSIS — G40909 Epilepsy, unspecified, not intractable, without status epilepticus: Secondary | ICD-10-CM | POA: Diagnosis not present

## 2024-09-15 DIAGNOSIS — N1832 Chronic kidney disease, stage 3b: Secondary | ICD-10-CM | POA: Diagnosis not present

## 2024-09-15 DIAGNOSIS — E213 Hyperparathyroidism, unspecified: Secondary | ICD-10-CM | POA: Diagnosis not present

## 2024-09-15 DIAGNOSIS — N400 Enlarged prostate without lower urinary tract symptoms: Secondary | ICD-10-CM | POA: Diagnosis not present

## 2024-09-15 DIAGNOSIS — N179 Acute kidney failure, unspecified: Secondary | ICD-10-CM | POA: Diagnosis not present

## 2024-09-15 DIAGNOSIS — D631 Anemia in chronic kidney disease: Secondary | ICD-10-CM | POA: Diagnosis not present

## 2024-09-15 NOTE — Progress Notes (Signed)
 Referring Provider: Ammon Blunt, Md [address] [facility] [place],  [place] [address]  Primary Care Provider: Glover Alm Hail, MD [phone] [place] [address]   Chief Complaint Chief Complaint  Patient presents with  . Shortness of Breath    With exertion   . Atrial Fibrillation  . Ankle Swelling    BIL;patient stated not since the hospital   . Foot Swelling    BIL;patient stated not since the hospital   . Leg Swelling    BIL;patient stated not since the hospital   . Hypotension    Patient stated 92/50 Thursday by home health nurse     Patient Identification and History of Present Illness  [name] is a 63 y.o. male with a history of CKD, long standing peripheral edema, epilepsy, Afib history (no h/o OAC per patient), recently in the hospital at [facility] with ICU stay for Afib with RVR and hypotension. Negative workup including infectious and ischemic, he improved off lisinopril  on midodrine  and propranolol  dose cur in half.  Because of his anemia and thrombocytopenia he remained off OAC and has appointment with hematology. He remains hypotensive today but is asymptomatic. He was also noted to be hyperkalemic at recent admission which also resolved with medication changes.  His admission to the hospital was precipitated by weakness experienced while in Afib with RVR and chest pain. He has not experienced further symptoms at this time.    Problem List    Patient Active Problem List  Diagnosis  . Partial symptomatic epilepsy with complex partial seizures, not intractable, without status epilepticus (CMS/HHS-HCC)  . Essential hypertension  . CKD (chronic kidney disease) stage 3, GFR 30-59 ml/min (CMS/HHS-HCC)  . Anemia, unspecified type  . Tremor  . Osteoporosis without current pathological fracture  . History of urinary retention  . New onset atrial fibrillation (CMS/HHS-HCC)  . Exertional dyspnea  . Peripheral edema  .  Hyponatremia  . Hyperlipidemia      Allergies    Allergies  Allergen Reactions  . Citrate Dextrose  Solution Other (See Comments)    ACD-A  . Nsaids (Non-Steroidal Anti-Inflammatory Drug) Other (See Comments)    Kidney disease     Medications    Current Outpatient Medications  Medication Sig Dispense Refill  . albuterol 90 mcg/actuation inhaler Inhale 2 inhalations into the lungs every 6 (six) hours as needed for Wheezing 8.5 g 12  . alendronate  (FOSAMAX ) 70 MG tablet TAKE 1 TABLET BY MOUTH ON ARISING WITH AGLASS OF WATER ONE DAY A WEEK; WAIT 30 MINUTES BEFORE EATING OR DRINKING ANYTHING ELSE 4 tablet 12  . atorvastatin  (LIPITOR ) 80 MG tablet TAKE 1 TABLET BY MOUTH ONCE DAILY 90 tablet 3  . cholecalciferol, vitamin D3, (VITAMIN D3) 125 mcg (5,000 unit) tablet Take 5,000 Units by mouth once daily    . cyanocobalamin  (VITAMIN B12) 1000 MCG tablet Take 1,000 mcg by mouth once daily    . divalproex  (DEPAKOTE ) 250 MG DR tablet Take 3 tablets po in the am and three tablets po in pm. 180 tablet 0  . finasteride  (PROSCAR ) 5 mg tablet Take 5 mg by mouth once daily    . levETIRAcetam  (KEPPRA ) 500 MG tablet Take 1 tablet PO in am and 1 tablet po in the pm. 60 tablet 5  . midodrine  (PROAMATINE ) 10 MG tablet Take 1 tablet (10 mg total) by mouth 3 (three) times daily with meals.    . primidone  (MYSOLINE ) 50 MG tablet Take 50 mg  by mouth    . propranoloL  (INDERAL ) 20 MG tablet Take 0.5 tablets (10 mg total) by mouth 2 (two) times daily 180 tablet 0  . tamsulosin  (FLOMAX ) 0.4 mg capsule TAKE 1 CAPSULE BY MOUTH ONCE DAILY 30 MINUTES AFTER SAME MEAL EACH DAY 90 capsule 3   No current facility-administered medications for this visit.     Social History    Social History   Socioeconomic History  . Marital status: Single  Tobacco Use  . Smoking status: Never  . Smokeless tobacco: Never  Substance and Sexual Activity  . Alcohol use: Yes    Comment: [facility].  . Drug use: No  Social History  Narrative   Colon: 05/01/16, repeat 5 years    Social Drivers of Health   Financial Resource Strain: Low Risk  (07/10/2023)   Overall Financial Resource Strain (CARDIA)   . Difficulty of Paying Living Expenses: Not hard at all  Food Insecurity: No Food Insecurity (08/31/2024)   Received from [facility]   Hunger Vital Sign   . Within the past 12 months, you worried that your food would run out before you got the money to buy more.: Never true   . Within the past 12 months, the food you bought just didn't last and you didn't have money to get more.: Never true  Transportation Needs: No Transportation Needs (08/31/2024)   Received from [facility] Transportation   . In the past 12 months, has lack of transportation kept you from medical appointments or from getting medications?: No   . In the past 12 months, has lack of transportation kept you from meetings, work, or from getting things needed for daily living?: No  Social Connections: Unknown (08/31/2024)   Received from [facility]   Social Connection and Isolation Panel   . In a typical week, how many times do you talk on the phone with family, friends, or neighbors?: More than three times a week   . How often do you get together with friends or relatives?: More than three times a week   . How often do you attend church or religious services?: More than 4 times per year   . Do you belong to any clubs or organizations such as church groups, unions, fraternal or athletic groups, or school groups?: Yes   . How often do you attend meetings of the clubs or organizations you belong to?: More than 4 times per year   . Are you married, widowed, divorced, separated, never married, or living with a partner?: Patient declined  Housing Stability: Low Risk  (07/10/2023)   Housing Stability Vital Sign   . Unable to Pay for Housing in the Last Year: No   . Number of Times Moved in the Last Year: 0   . Homeless in the Last Year: No      Family History    No family history on file.   Review of Systems     GENERAL HEALTH/CONSTITUTIONAL []  Appetite change []  Fevers or chills [x]  Fatigue []  Unexpected weight gain/loss []  Reduced activity  EYES, EARS, NOSE, THROAT []  Vision changes (blurred, double vision) []  Hearing changes / loss []  Tinnitus / ringing in ears []  Dental problem (dentition / gums) []  Vertigo / dizziness []  Nose bleeds []  Voice changes []  Trouble swallowing  RESPIRATORY []  Cough []  Shortness of breath []  Sleep apnea []  Snoring []  Sputum []  Wheezing  CARDIOVASCULAR []  Chest pain, pressure, or tightness []  Palpitations/heart flutters []   Near passing out []  Passing out [x]  Leg / ankle swelling []  Waking up short of breath []  Sleeping on an incline/more than one pillow []  Leg pain / cramps with walking  GASTROINTESTINAL []  Abdominal bloating/swelling []  Abdominal pain []  Blood on toilet tissue, in bowl, in stool []  Black, tarry stool []  Constipation []  Diarrhea []  Nausea []  Vomiting []  Heartburn symptoms URINARY []  Blood in urine []  Excessive urination []  Frequent urination []  Difficulty urinating []  Flank pain / painful urination []  Excess sweating / thirst []  Nocturia need to urinate more then 1 time at night []  Decreased amount of urine  MUSCULOSKELETAL []  Joint aches/pains [x]  Swollen joints []  Back pain [x]  Muscle aches / pains  SKIN []  Color change []  Rash []  Wound  NEUROLOGIC []  Memory Changes []  Tremors / problems with balance []  Weakness []  Tingling / paresthesias []  Numbness []  Headaches []  Speech difficulty  HEMATOLOGIC []  History of blood clots []  Bruises/bleeds easily  PSYCHIATRIC []  Anxiety / Stress []  Depression []  Sleep disturbance  ENDOCRINE []  Thyroid  problems []  Heat intolerance []  Cold Intolerance      Physical Exam   Vitals: BP (!) 86/60 (BP Location: Left upper arm, Patient Position: Sitting, BP Cuff Size:  Adult)   Pulse (!) 48   Resp 12   Ht 160 cm (5' 3)   Wt 55.1 kg (121 lb 6.4 oz)   SpO2 99%   BMI 21.51 kg/m   General Appearance:  alert, cooperative, in NAD  HEENT: oropharynx clear, moist mucous membranes  Neurologic: motor and sensory grossly normal bilaterally   Psych: oriented to time, place and person, mood and affect are appropriate  Neck: supple, no significant adenopathy, no jugular venous distension, no bruits  Lungs:   clear to auscultation with good air movement, no rales bilaterally, unlabored breathing   Heart:  normal rate, regular rhythm, normal S1, S2, no audible murmurs or gallops  Abdomen:   soft, nontender, nondistended, normoactive bowel sounds   Extremities: no pedal edema noted, warm   Musculoskeletal: Moves all extremities. Ambulates without Difficulty   Skin: No rashes or ulcers   Cardiac Data   12 lead ECG:  I personally ordered, reviewed, and interpreted the ECG performed on 09/15/2024 showing: rhythm: sinus bradycardia, rate=47,  Results for orders placed or performed in visit on 09/15/24  ECG 12-lead   Collection Time: 09/15/24 11:25 AM  Result Value Ref Range   Vent Rate (bpm) 47    PR Interval (msec) 156    QRS Interval (msec) 92    QT Interval (msec) 456    QTc (msec) 403      Review and Summary of Prior Cardiac Studies: Echo 08/27/2024:  1. Left ventricular ejection fraction, by estimation, is 60 to 65%. The left ventricle has normal function. The left ventricle has no regional wall motion abnormalities. Left ventricular diastolic parameters are indeterminate.   2. Right ventricular systolic function is normal. The right ventricular size is normal.   3. The mitral valve is normal in structure. Trivial mitral valve regurgitation. No evidence of mitral stenosis.   4. The aortic valve is normal in structure. Aortic valve regurgitation is not visualized. No aortic stenosis is present.   5. The inferior vena cava is normal in size with greater than  50% respiratory variability, suggesting right atrial pressure of 3 mmHg.   Assessment and Plan   [name] is a 63 y.o. male with a history of CKD, long standing peripheral edema, epilepsy, Afib  history (no h/o OAC per patient), recently in the hospital at [facility] with ICU stay for Afib with RVR and hypotension. Negative workup including infectious and ischemic, he improved off lisinopril  on midodrine  and propranolol  dose cur in half.  Because of his anemia and thrombocytopenia he remained off OAC and has appointment with hematology. He remains hypotensive today but is asymptomatic. He was also noted to be hyperkalemic at recent admission which also resolved with medication changes.  His admission to the hospital was precipitated by weakness experienced while in Afib with RVR and chest pain. He has not experienced further symptoms at this time.    Hypotension: unclear change in BP.  Prior mild hypertension, now hypotensive off meds. We will hold propranolol  as well bc of bradycardia and hypotension. Plan to follow up next week with BP check.  Bradycardia: hold propranolol  may need to find new med for tremors if they get worse.  Anemia and thrombocytopenia: plan to see hematology tomorrow, can follow up next week to discuss OAC even if for short term and WATCHMAN placement. Afib: did have symptoms with RVR limited AAD options with bradycardia.  I will ask my partner Dr. Debby to see him to discuss PVI and WATCHMAN placement.  Future Appointments     Date/Time Provider Department Center Visit Type   09/18/2024 10:15 AM Glover, Alm Hail, MD [facility] CL VIDEO VISIT NEW   09/22/2024 3:45 PM Paraschos, Marsa, MD [facility] C FOLLOW UP       Orders Placed This Encounter  Procedures  . ECG 12-lead     I spent a total of 45 minutes in both face-to-face and non-face-to-face activities, excluding procedures performed, for this visit on the date of this  encounter.

## 2024-09-16 ENCOUNTER — Other Ambulatory Visit: Payer: Self-pay

## 2024-09-16 ENCOUNTER — Encounter

## 2024-09-16 ENCOUNTER — Inpatient Hospital Stay

## 2024-09-16 ENCOUNTER — Encounter: Payer: Self-pay | Admitting: Oncology

## 2024-09-16 ENCOUNTER — Inpatient Hospital Stay: Attending: Oncology | Admitting: Oncology

## 2024-09-16 VITALS — BP 126/67 | HR 46 | Temp 96.6°F | Resp 18 | Ht 63.0 in | Wt 121.0 lb

## 2024-09-16 DIAGNOSIS — N1832 Chronic kidney disease, stage 3b: Secondary | ICD-10-CM | POA: Insufficient documentation

## 2024-09-16 DIAGNOSIS — D631 Anemia in chronic kidney disease: Secondary | ICD-10-CM | POA: Diagnosis not present

## 2024-09-16 DIAGNOSIS — D649 Anemia, unspecified: Secondary | ICD-10-CM

## 2024-09-16 DIAGNOSIS — E538 Deficiency of other specified B group vitamins: Secondary | ICD-10-CM

## 2024-09-16 DIAGNOSIS — Z79899 Other long term (current) drug therapy: Secondary | ICD-10-CM

## 2024-09-16 LAB — CBC WITH DIFFERENTIAL (CANCER CENTER ONLY)
Abs Immature Granulocytes: 0.04 K/uL (ref 0.00–0.07)
Basophils Absolute: 0 K/uL (ref 0.0–0.1)
Basophils Relative: 0 %
Eosinophils Absolute: 0.3 K/uL (ref 0.0–0.5)
Eosinophils Relative: 5 %
HCT: 26.5 % — ABNORMAL LOW (ref 39.0–52.0)
Hemoglobin: 8.9 g/dL — ABNORMAL LOW (ref 13.0–17.0)
Immature Granulocytes: 1 %
Lymphocytes Relative: 27 %
Lymphs Abs: 1.6 K/uL (ref 0.7–4.0)
MCH: 32.2 pg (ref 26.0–34.0)
MCHC: 33.6 g/dL (ref 30.0–36.0)
MCV: 96 fL (ref 80.0–100.0)
Monocytes Absolute: 0.7 K/uL (ref 0.1–1.0)
Monocytes Relative: 11 %
Neutro Abs: 3.4 K/uL (ref 1.7–7.7)
Neutrophils Relative %: 56 %
Platelet Count: 188 K/uL (ref 150–400)
RBC: 2.76 MIL/uL — ABNORMAL LOW (ref 4.22–5.81)
RDW: 19.8 % — ABNORMAL HIGH (ref 11.5–15.5)
WBC Count: 6.1 K/uL (ref 4.0–10.5)
nRBC: 0 % (ref 0.0–0.2)

## 2024-09-16 MED ORDER — EPOETIN ALFA-EPBX 40000 UNIT/ML IJ SOLN
40000.0000 [IU] | INTRAMUSCULAR | Status: DC
  Administered 2024-09-16: 40000 [IU] via SUBCUTANEOUS
  Filled 2024-09-16: qty 1

## 2024-09-16 NOTE — Progress Notes (Signed)
 Patient is a hospital follow up.

## 2024-09-17 DIAGNOSIS — N1832 Chronic kidney disease, stage 3b: Secondary | ICD-10-CM | POA: Diagnosis not present

## 2024-09-17 DIAGNOSIS — E213 Hyperparathyroidism, unspecified: Secondary | ICD-10-CM | POA: Diagnosis not present

## 2024-09-17 DIAGNOSIS — I4891 Unspecified atrial fibrillation: Secondary | ICD-10-CM | POA: Diagnosis not present

## 2024-09-17 DIAGNOSIS — G40909 Epilepsy, unspecified, not intractable, without status epilepticus: Secondary | ICD-10-CM | POA: Diagnosis not present

## 2024-09-17 DIAGNOSIS — D631 Anemia in chronic kidney disease: Secondary | ICD-10-CM | POA: Diagnosis not present

## 2024-09-17 DIAGNOSIS — N179 Acute kidney failure, unspecified: Secondary | ICD-10-CM | POA: Diagnosis not present

## 2024-09-17 DIAGNOSIS — I4892 Unspecified atrial flutter: Secondary | ICD-10-CM | POA: Diagnosis not present

## 2024-09-17 DIAGNOSIS — I340 Nonrheumatic mitral (valve) insufficiency: Secondary | ICD-10-CM | POA: Diagnosis not present

## 2024-09-17 DIAGNOSIS — N400 Enlarged prostate without lower urinary tract symptoms: Secondary | ICD-10-CM | POA: Diagnosis not present

## 2024-09-18 DIAGNOSIS — R569 Unspecified convulsions: Secondary | ICD-10-CM | POA: Diagnosis not present

## 2024-09-18 DIAGNOSIS — E785 Hyperlipidemia, unspecified: Secondary | ICD-10-CM | POA: Diagnosis not present

## 2024-09-18 DIAGNOSIS — I4891 Unspecified atrial fibrillation: Secondary | ICD-10-CM | POA: Diagnosis not present

## 2024-09-18 DIAGNOSIS — Z8679 Personal history of other diseases of the circulatory system: Secondary | ICD-10-CM | POA: Diagnosis not present

## 2024-09-18 DIAGNOSIS — N183 Chronic kidney disease, stage 3 unspecified: Secondary | ICD-10-CM | POA: Diagnosis not present

## 2024-09-18 DIAGNOSIS — D631 Anemia in chronic kidney disease: Secondary | ICD-10-CM | POA: Diagnosis not present

## 2024-09-20 ENCOUNTER — Encounter: Payer: Self-pay | Admitting: Oncology

## 2024-09-20 NOTE — Progress Notes (Signed)
 Hematology/Oncology Consult note [facility]  Telephone:([phone] [phone]  Patient Care Team: Glover Lenis, MD as PCP - General (Family Medicine)   Name of the patient: [name]  [id number]  1961-02-16   Date of visit: 09/20/24  Diagnosis-anemia of chronic kidney disease  Chief complaint/ Reason for visit-posthospital discharge follow-up  Heme/Onc history: Patient is a 63 year old male with a past medical history significant for stage III CKD, history of migraine and seizure disorder who presented to the ICU with A-fib with RVR and hypotension.  At baseline patient's hemoglobin was around 11.3 in December 2022 and it was down to 7-8 during his hospitalization.  Results of anemia workup showed a low folic acid  level but normal iron and B12 and TSH.  MR panel showed no evidence of M protein both kappa and lambda light chains were elevated with a normal free light chain ratio.    Patient also underwent bone marrow biopsy on 08/31/2024 which showed hypocellular bone marrow versus inadequate specimen.  Extremely limited hematopoietic marrow for review and spicules consist mostly exclusively of adipose tissue with few scattered histiocytes.  Biopsy also consists almost exclusively of fatty type marrow with focal rare Field of hematopoietic activity.  Possible bone marrow damage and possible aplastic anemia versus inadequate sampling and repeat bone marrow biopsy was recommended.  Interval history-patient is able to ambulate with the help of a cane.  He has not had any falls since his hospitalization.  ECOG PS- 3 Pain scale- 0  Review of systems- Review of Systems  Constitutional:  Negative for chills, fever, malaise/fatigue and weight loss.  HENT:  Negative for congestion, ear discharge and nosebleeds.   Eyes:  Negative for blurred vision.  Respiratory:  Negative for cough, hemoptysis, sputum production, shortness of breath and wheezing.    Cardiovascular:  Negative for chest pain, palpitations, orthopnea and claudication.  Gastrointestinal:  Negative for abdominal pain, blood in stool, constipation, diarrhea, heartburn, melena, nausea and vomiting.  Genitourinary:  Negative for dysuria, flank pain, frequency, hematuria and urgency.  Musculoskeletal:  Negative for back pain, joint pain and myalgias.  Skin:  Negative for rash.  Neurological:  Negative for dizziness, tingling, focal weakness, seizures, weakness and headaches.  Endo/Heme/Allergies:  Does not bruise/bleed easily.  Psychiatric/Behavioral:  Negative for depression and suicidal ideas. The patient does not have insomnia.       Allergies  Allergen Reactions   Enoxaparin  Itching    Per pt it makes my eyes steam   Nsaids Other (See Comments)    Kidney disease     Past Medical History:  Diagnosis Date   Anemia    GERD (gastroesophageal reflux disease)    Hypertension    Hyponatremia    chronic   Kidney disease    Migraine    Seizures (HCC)      Past Surgical History:  Procedure Laterality Date   COLONOSCOPY WITH PROPOFOL  N/A 05/01/2016   Procedure: COLONOSCOPY WITH PROPOFOL ;  Surgeon: Rogelia Copping, MD;  Location: [facility] ENDOSCOPY;  Service: Endoscopy;  Laterality: N/A;   COLONOSCOPY WITH PROPOFOL  N/A 06/26/2022   Procedure: COLONOSCOPY WITH PROPOFOL ;  Surgeon: Copping Rogelia, MD;  Location: [facility] ENDOSCOPY;  Service: Endoscopy;  Laterality: N/A;   ESOPHAGOGASTRODUODENOSCOPY (EGD) WITH PROPOFOL  N/A 05/01/2016   Procedure: ESOPHAGOGASTRODUODENOSCOPY (EGD) WITH PROPOFOL ;  Surgeon: Rogelia Copping, MD;  Location: [facility] ENDOSCOPY;  Service: Endoscopy;  Laterality: N/A;   IR BONE MARROW BIOPSY & ASPIRATION  08/31/2024   none  Social History   Socioeconomic History   Marital status: Single    Spouse name: Not on file   Number of children: Not on file   Years of education: Not on file   Highest education level: Not on file  Occupational History   Not on file   Tobacco Use   Smoking status: Never   Smokeless tobacco: Never  Vaping Use   Vaping status: Never Used  Substance and Sexual Activity   Alcohol use: No   Drug use: No   Sexual activity: Not on file  Other Topics Concern   Not on file  Social History Narrative   Cross dresses    Social Drivers of Health   Financial Resource Strain: Low Risk  (07/10/2023)   Received [facility]   Overall Financial Resource Strain (CARDIA)    Difficulty of Paying Living Expenses: Not hard at all  Food Insecurity: No Food Insecurity (09/16/2024)   Hunger Vital Sign    Worried About Running Out of Food in the Last Year: Never true    Ran Out of Food in the Last Year: Never true  Transportation Needs: No Transportation Needs (09/16/2024)   PRAPARE - Administrator, Civil Service (Medical): No    Lack of Transportation (Non-Medical): No  Physical Activity: Not on file  Stress: Not on file  Social Connections: Unknown (08/31/2024)   Social Connection and Isolation Panel    Frequency of Communication with Friends and Family: More than three times a week    Frequency of Social Gatherings with Friends and Family: More than three times a week    Attends Religious Services: More than 4 times per year    Active Member of [employer] or Organizations: Yes    Attends Engineer, structural: More than 4 times per year    Marital Status: Patient declined  Intimate Partner Violence: Not At Risk (09/16/2024)   Humiliation, Afraid, Rape, and Kick questionnaire    Fear of Current or Ex-Partner: No    Emotionally Abused: No    Physically Abused: No    Sexually Abused: No    Family History  Problem Relation Age of Onset   Heart attack Father    Kidney disease Father      Current Outpatient Medications:    finasteride  (PROSCAR ) 5 MG tablet, Take 1 tablet (5 mg total) by mouth daily., Disp: 30 tablet, Rfl: 1   folic acid  (FOLVITE ) 1 MG tablet, Take 1 tablet (1 mg total) by  mouth daily., Disp: 30 tablet, Rfl: 1   levETIRAcetam  (KEPPRA ) 500 MG tablet, Take 1 tablet (500 mg total) by mouth 2 (two) times daily., Disp: 60 tablet, Rfl: 2   [Paused] lisinopril  (ZESTRIL ) 5 MG tablet, Hold this medicine until followup with outpatient doctor because your blood pressure was normal in the hospital without it., Disp: , Rfl:    midodrine  (PROAMATINE ) 10 MG tablet, Take 1 tablet (10 mg total) by mouth 3 (three) times daily with meals., Disp: 90 tablet, Rfl: 1   primidone  (MYSOLINE ) 50 MG tablet, Take 1 tablet (50 mg total) by mouth every 8 (eight) hours., Disp: 90 tablet, Rfl: 1   tamsulosin  (FLOMAX ) 0.4 MG CAPS capsule, Take 1 capsule (0.4 mg total) by mouth at bedtime., Disp: , Rfl:    alendronate  (FOSAMAX ) 70 MG tablet, Take 70 mg by mouth once a week. Take with a full glass of water on an empty stomach., Disp: , Rfl:  ascorbic acid (VITAMIN C) 1000 MG tablet, Take 1,000 mg by mouth daily., Disp: , Rfl:    atorvastatin  (LIPITOR ) 80 MG tablet, Take 80 mg by mouth at bedtime., Disp: , Rfl:    Cholecalciferol 50 MCG (2000 UT) CAPS, Take 1 capsule by mouth daily., Disp: , Rfl:    Cyanocobalamin  2000 MCG TBCR, , Disp: , Rfl:    divalproex  (DEPAKOTE ) 250 MG DR tablet, Take 3 tablets (750 mg total) by mouth every 12 (twelve) hours. (Patient taking differently: Take 750 mg by mouth 2 (two) times daily.), Disp: 100 tablet, Rfl: 1   feeding supplement, ENSURE ENLIVE, (ENSURE ENLIVE) LIQD, Take 237 mLs by mouth 3 (three) times daily between meals. (Patient not taking: Reported on 09/16/2024), Disp: 237 mL, Rfl: 12   propranolol  (INDERAL ) 10 MG tablet, Take 1 tablet (10 mg total) by mouth 2 (two) times daily. (Patient not taking: Reported on 09/16/2024), Disp: 60 tablet, Rfl: 1  Physical exam:  Vitals:   09/16/24 1358  BP: 126/67  Pulse: (!) 46  Resp: 18  Temp: (!) 96.6 F (35.9 C)  TempSrc: Tympanic  SpO2: 100%  Weight: 121 lb (54.9 kg)  Height: 5' 3 (1.6 m)   Physical  Exam Cardiovascular:     Rate and Rhythm: Normal rate and regular rhythm.     Heart sounds: Normal heart sounds.  Pulmonary:     Effort: Pulmonary effort is normal.     Breath sounds: Normal breath sounds.  Skin:    General: Skin is warm and dry.  Neurological:     Mental Status: He is alert and oriented to person, place, and time.      I have personally reviewed labs listed below:    Latest Ref Rng & Units 09/05/2024    5:41 AM  CMP  Glucose 70 - 99 mg/dL 81   BUN 8 - 23 mg/dL 48   Creatinine 9.38 - 1.24 mg/dL 8.00   Sodium 864 - 854 mmol/L 137   Potassium 3.5 - 5.1 mmol/L 4.4   Chloride 98 - 111 mmol/L 96   CO2 22 - 32 mmol/L 26   Calcium  8.9 - 10.3 mg/dL 9.6       Latest Ref Rng & Units 09/16/2024    1:35 PM  CBC  WBC 4.0 - 10.5 K/uL 6.1   Hemoglobin 13.0 - 17.0 g/dL 8.9   Hematocrit 60.9 - 52.0 % 26.5   Platelets 150 - 400 K/uL 188    I have personally reviewed Radiology images listed below: No images are attached to the encounter.  US  RENAL Result Date: 09/03/2024 CLINICAL DATA:  Hydronephrosis EXAM: RENAL / URINARY TRACT ULTRASOUND COMPLETE COMPARISON:  11/26/2013 FINDINGS: Right Kidney: Renal measurements: 10.3 x 4.4 x 4.5 cm = volume: 108 mL. No hydronephrosis or mass. Diffusely increased echotexture. Left Kidney: Renal measurements: 9.7 x 5.9 x 5.1 cm = volume: 154 mL. No hydronephrosis. 1.2 cm lower pole medial cyst appears simple. No follow-up imaging recommended. Diffusely increased echotexture. Bladder: Bladder is not fully distended, but posterior wall appears thickened measuring up to 1.2 cm in thickness. Echogenic focus along the anterior bladder wall could reflect a small amount of air and be related to recent catheterization. Other: None IMPRESSION: No hydronephrosis. Diffusely increased echotexture throughout the kidneys compatible with chronic medical renal disease, stable since prior study. Concern for bladder wall thickening posteriorly, although the  bladder is not fully distended. Electronically Signed   By: Franky Crease M.D.   On: 09/03/2024  14:13   IR BONE MARROW BIOPSY & ASPIRATION Result Date: 08/31/2024 INDICATION: UNSPECIFIED ANEMIA EXAM: FLUORO GUIDED RIGHT ILIAC BONE MARROW ASPIRATION AND CORE BIOPSY Date:  08/31/2024 08/31/2024 10:44 am Radiologist:  M. Frederic Specking, MD Guidance:  MURTIS FLUOROSCOPY: Fluoroscopy Time: 1 minutes 6 seconds (3.7 mGy). MEDICATIONS: 1% LIDOCAINE  LOCAL ANESTHESIA/SEDATION: 1 mg IV Versed ; 50 mcg IV Fentanyl  Moderate Sedation Time: 10 min The patient was continuously monitored during the procedure by the interventional radiology nurse under my direct supervision. CONTRAST:  none COMPLICATIONS: None PROCEDURE: Informed consent was obtained from the patient following explanation of the procedure, risks, benefits and alternatives. The patient understands, agrees and consents for the procedure. All questions were addressed. A time out was performed. The patient was positioned prone and fluoro localization was performed of the pelvis to demonstrate the iliac marrow spaces. Maximal barrier sterile technique utilized including caps, mask, sterile gowns, sterile gloves, large sterile drape, hand hygiene, and Betadine prep. Under sterile conditions and local anesthesia, an 11 gauge coaxial bone biopsy needle was advanced into the right iliac marrow space. Needle position was confirmed with fluoro imagng. Initially, bone marrow aspiration was performed. Next, the 11 gauge outer cannula was utilized to obtain a right iliac bone marrow core biopsy. Needle was removed. Hemostasis was obtained with compression. The patient tolerated the procedure well. Samples were prepared with the cytotechnologist. No immediate complications. IMPRESSION: fluoro guided right iliac bone marrow aspiration and core biopsy. Electronically Signed   By: CHRISTELLA.  Shick M.D.   On: 08/31/2024 10:49   ECHOCARDIOGRAM COMPLETE Result Date: 08/27/2024    ECHOCARDIOGRAM  REPORT   Patient Name:   [name] Date of Exam: 08/27/2024 Medical Rec #:  [id number]        Height:       63.0 in Accession #:    [id number]       Weight:       130.7 lb Date of Birth:  1961/01/03        BSA:          1.614 m Patient Age:    63 years         BP:           88/63 mmHg Patient Gender: M                HR:           49 bpm. Exam Location:  [facility] Procedure: 2D Echo, Cardiac Doppler, Color Doppler and Intracardiac            Opacification Agent (Both Spectral and Color Flow Doppler were            utilized during procedure). Indications:     Atrial Flutter I48.92  History:         Patient has prior history of Echocardiogram examinations, most                  recent 09/17/2020. Arrythmias:Atrial Flutter.  Sonographer:     Ashley McNeely-Sloane Referring Phys:  [id number] DELAYNE LULLA SOLIAN Diagnosing Phys: Marsa Dooms MD IMPRESSIONS  1. Left ventricular ejection fraction, by estimation, is 60 to 65%. The left ventricle has normal function. The left ventricle has no regional wall motion abnormalities. Left ventricular diastolic parameters are indeterminate.  2. Right ventricular systolic function is normal. The right ventricular size is normal.  3. The mitral valve is normal in structure. Trivial mitral valve regurgitation. No evidence of mitral stenosis.  4. The aortic valve is  normal in structure. Aortic valve regurgitation is not visualized. No aortic stenosis is present.  5. The inferior vena cava is normal in size with greater than 50% respiratory variability, suggesting right atrial pressure of 3 mmHg. FINDINGS  Left Ventricle: Left ventricular ejection fraction, by estimation, is 60 to 65%. The left ventricle has normal function. The left ventricle has no regional wall motion abnormalities. Definity  contrast agent was given IV to delineate the left ventricular  endocardial borders. Strain was performed and the global longitudinal strain is indeterminate. The left ventricular internal cavity  size was normal in size. There is no left ventricular hypertrophy. Left ventricular diastolic parameters are indeterminate. Right Ventricle: The right ventricular size is normal. No increase in right ventricular wall thickness. Right ventricular systolic function is normal. Left Atrium: Left atrial size was normal in size. Right Atrium: Right atrial size was normal in size. Pericardium: There is no evidence of pericardial effusion. Mitral Valve: The mitral valve is normal in structure. Trivial mitral valve regurgitation. No evidence of mitral valve stenosis. MV peak gradient, 6.2 mmHg. The mean mitral valve gradient is 1.0 mmHg. Tricuspid Valve: The tricuspid valve is normal in structure. Tricuspid valve regurgitation is trivial. No evidence of tricuspid stenosis. Aortic Valve: The aortic valve is normal in structure. Aortic valve regurgitation is not visualized. No aortic stenosis is present. Aortic valve mean gradient measures 2.0 mmHg. Aortic valve peak gradient measures 4.8 mmHg. Aortic valve area, by VTI measures 2.39 cm. Pulmonic Valve: The pulmonic valve was normal in structure. Pulmonic valve regurgitation is not visualized. No evidence of pulmonic stenosis. Aorta: The aortic root is normal in size and structure. Venous: The inferior vena cava is normal in size with greater than 50% respiratory variability, suggesting right atrial pressure of 3 mmHg. IAS/Shunts: No atrial level shunt detected by color flow Doppler. Additional Comments: 3D was performed not requiring image post processing on an independent workstation and was indeterminate.  LEFT VENTRICLE PLAX 2D LVIDd:         3.60 cm     Diastology LVIDs:         2.20 cm     LV e' medial:    9.99 cm/s LV PW:         0.90 cm     LV E/e' medial:  11.2 LV IVS:        0.80 cm     LV e' lateral:   14.60 cm/s LVOT diam:     1.90 cm     LV E/e' lateral: 7.7 LV SV:         65 LV SV Index:   40 LVOT Area:     2.84 cm  LV Volumes (MOD) LV vol d, MOD A2C: 61.6 ml  LV vol d, MOD A4C: 60.7 ml LV vol s, MOD A2C: 25.7 ml LV vol s, MOD A4C: 26.1 ml LV SV MOD A2C:     35.9 ml LV SV MOD A4C:     60.7 ml LV SV MOD BP:      34.9 ml RIGHT VENTRICLE            IVC RV Basal diam:  3.50 cm    IVC diam: 2.30 cm RV Mid diam:    2.60 cm RV S prime:     9.46 cm/s TAPSE (M-mode): 2.3 cm LEFT ATRIUM             Index        RIGHT ATRIUM  Index LA diam:        3.20 cm 1.98 cm/m   RA Area:     12.50 cm LA Vol (A2C):   27.4 ml 16.98 ml/m  RA Volume:   25.10 ml  15.55 ml/m LA Vol (A4C):   43.6 ml 27.01 ml/m LA Biplane Vol: 34.2 ml 21.19 ml/m  AORTIC VALVE                    PULMONIC VALVE AV Area (Vmax):    2.63 cm     PV Vmax:        0.70 m/s AV Area (Vmean):   2.50 cm     PV Vmean:       46.800 cm/s AV Area (VTI):     2.39 cm     PV VTI:         0.156 m AV Vmax:           109.00 cm/s  PV Peak grad:   1.9 mmHg AV Vmean:          71.400 cm/s  PV Mean grad:   1.0 mmHg AV VTI:            0.273 m      RVOT Peak grad: 1 mmHg AV Peak Grad:      4.8 mmHg AV Mean Grad:      2.0 mmHg LVOT Vmax:         101.00 cm/s LVOT Vmean:        62.900 cm/s LVOT VTI:          0.230 m LVOT/AV VTI ratio: 0.84  AORTA Ao Root diam: 3.40 cm MITRAL VALVE                TRICUSPID VALVE MV Area (PHT): 4.80 cm     TR Peak grad:   27.2 mmHg MV Area VTI:   1.97 cm     TR Vmax:        261.00 cm/s MV Peak grad:  6.2 mmHg MV Mean grad:  1.0 mmHg     SHUNTS MV Vmax:       1.25 m/s     Systemic VTI:  0.23 m MV Vmean:      45.9 cm/s    Systemic Diam: 1.90 cm MV Decel Time: 158 msec     Pulmonic VTI:  0.122 m MV E velocity: 112.00 cm/s MV A velocity: 44.50 cm/s MV E/A ratio:  2.52 Marsa Dooms MD Electronically signed by Marsa Dooms MD Signature Date/Time: 08/27/2024/1:26:42 PM    Final    CT Angio Chest PE W/Cm &/Or Wo Cm Result Date: 08/26/2024 EXAM: CTA of the Chest with contrast for PE 08/26/2024 09:05:01 PM TECHNIQUE: CTA of the chest was performed after the administration of intravenous  contrast. Multiplanar reformatted images are provided for review. MIP images are provided for review. Automated exposure control, iterative reconstruction, and/or weight based adjustment of the mA/kV was utilized to reduce the radiation dose to as low as reasonably achievable. COMPARISON: 09/23/2020 CLINICAL HISTORY: Pulmonary embolism (PE) suspected, high probability. Patient found on the floor by EMS with chest pain, atrial fibrillation with rapid ventricular response, hypotension, and hypoxia. Initial blood pressure was 74/48. FINDINGS: PULMONARY ARTERIES: Pulmonary arteries are adequately opacified for evaluation. No pulmonary embolism. Main pulmonary artery is normal in caliber. MEDIASTINUM: The heart and pericardium demonstrate no acute abnormality. No pericardial effusion. There is no acute abnormality of the thoracic aorta. Coronary artery and aortic atherosclerotic  calcification. LYMPH NODES: No mediastinal, hilar or axillary lymphadenopathy. LUNGS AND PLEURA: The lungs are without acute process. No focal consolidation or pulmonary edema. No pleural effusion or pneumothorax. UPPER ABDOMEN: Limited images of the upper abdomen are unremarkable. SOFT TISSUES AND BONES: No acute bone or soft tissue abnormality. IMPRESSION: 1. No pulmonary embolism. 2. No acute pulmonary abnormality. Electronically signed by: Norman Gatlin MD 08/26/2024 09:10 PM EDT RP Workstation: [id number]   DG Chest 1 View Result Date: 08/26/2024 CLINICAL DATA:  Shortness of breath. EXAM: CHEST  1 VIEW COMPARISON:  Chest radiograph dated 12/17/2021. FINDINGS: The heart size and mediastinal contours are within normal limits. Both lungs are clear. The visualized skeletal structures are unremarkable. IMPRESSION: No active disease. Electronically Signed   By: Vanetta Chou M.D.   On: 08/26/2024 19:59     Assessment and plan- Patient is a 63 y.o. male for routine follow-up and posthospital discharge follow-up of anemia of chronic  kidney disease  Patient's present hemoglobin is 8.9/26.5.  Bone marrow biopsy was not conclusive since it indicated either hypocellular marrow versus inadequate sampling.  I do not want to subject the patient to another bone marrow biopsy at this time given his poor performance status.  He does have evidence of chronic kidney disease and therefore I would like him to get a trial of EPO to see if his anemia improves.  Discussed risks and benefits of EPO including all but not limited to possible risk of thromboembolic side effects.  Goal would be to keep his hemoglobin between 10 and 11.  He will receive EPO/Retacrit 40,000 units every 3 weeks with H&H I will see him back in 3 months with CBC ferritin and iron studies.  He does have evidence of low folic acid  and will continue with folic acid  supplements.   Visit Diagnosis 1. Anemia of chronic renal failure, stage 3b (HCC)      Dr. Annah Skene, MD, MPH [facility] [phone] 09/20/2024 6:04 PM

## 2024-09-22 DIAGNOSIS — I4892 Unspecified atrial flutter: Secondary | ICD-10-CM | POA: Diagnosis not present

## 2024-09-22 DIAGNOSIS — I10 Essential (primary) hypertension: Secondary | ICD-10-CM | POA: Diagnosis not present

## 2024-09-22 DIAGNOSIS — R600 Localized edema: Secondary | ICD-10-CM | POA: Diagnosis not present

## 2024-09-22 DIAGNOSIS — N1832 Chronic kidney disease, stage 3b: Secondary | ICD-10-CM | POA: Diagnosis not present

## 2024-09-22 DIAGNOSIS — N179 Acute kidney failure, unspecified: Secondary | ICD-10-CM | POA: Diagnosis not present

## 2024-09-22 DIAGNOSIS — D631 Anemia in chronic kidney disease: Secondary | ICD-10-CM | POA: Diagnosis not present

## 2024-09-22 DIAGNOSIS — G40909 Epilepsy, unspecified, not intractable, without status epilepticus: Secondary | ICD-10-CM | POA: Diagnosis not present

## 2024-09-22 DIAGNOSIS — R0609 Other forms of dyspnea: Secondary | ICD-10-CM | POA: Diagnosis not present

## 2024-09-22 DIAGNOSIS — N183 Chronic kidney disease, stage 3 unspecified: Secondary | ICD-10-CM | POA: Diagnosis not present

## 2024-09-22 DIAGNOSIS — I4891 Unspecified atrial fibrillation: Secondary | ICD-10-CM | POA: Diagnosis not present

## 2024-09-22 DIAGNOSIS — N400 Enlarged prostate without lower urinary tract symptoms: Secondary | ICD-10-CM | POA: Diagnosis not present

## 2024-09-22 DIAGNOSIS — I340 Nonrheumatic mitral (valve) insufficiency: Secondary | ICD-10-CM | POA: Diagnosis not present

## 2024-09-22 DIAGNOSIS — E213 Hyperparathyroidism, unspecified: Secondary | ICD-10-CM | POA: Diagnosis not present

## 2024-10-01 DIAGNOSIS — I340 Nonrheumatic mitral (valve) insufficiency: Secondary | ICD-10-CM | POA: Diagnosis not present

## 2024-10-01 DIAGNOSIS — G40909 Epilepsy, unspecified, not intractable, without status epilepticus: Secondary | ICD-10-CM | POA: Diagnosis not present

## 2024-10-01 DIAGNOSIS — D631 Anemia in chronic kidney disease: Secondary | ICD-10-CM | POA: Diagnosis not present

## 2024-10-01 DIAGNOSIS — I4891 Unspecified atrial fibrillation: Secondary | ICD-10-CM | POA: Diagnosis not present

## 2024-10-01 DIAGNOSIS — N179 Acute kidney failure, unspecified: Secondary | ICD-10-CM | POA: Diagnosis not present

## 2024-10-01 DIAGNOSIS — N400 Enlarged prostate without lower urinary tract symptoms: Secondary | ICD-10-CM | POA: Diagnosis not present

## 2024-10-01 DIAGNOSIS — E213 Hyperparathyroidism, unspecified: Secondary | ICD-10-CM | POA: Diagnosis not present

## 2024-10-01 DIAGNOSIS — N1832 Chronic kidney disease, stage 3b: Secondary | ICD-10-CM | POA: Diagnosis not present

## 2024-10-01 DIAGNOSIS — I4892 Unspecified atrial flutter: Secondary | ICD-10-CM | POA: Diagnosis not present

## 2024-10-03 DIAGNOSIS — N179 Acute kidney failure, unspecified: Secondary | ICD-10-CM | POA: Diagnosis not present

## 2024-10-03 DIAGNOSIS — I4892 Unspecified atrial flutter: Secondary | ICD-10-CM | POA: Diagnosis not present

## 2024-10-03 DIAGNOSIS — D631 Anemia in chronic kidney disease: Secondary | ICD-10-CM | POA: Diagnosis not present

## 2024-10-03 DIAGNOSIS — E213 Hyperparathyroidism, unspecified: Secondary | ICD-10-CM | POA: Diagnosis not present

## 2024-10-03 DIAGNOSIS — G40909 Epilepsy, unspecified, not intractable, without status epilepticus: Secondary | ICD-10-CM | POA: Diagnosis not present

## 2024-10-03 DIAGNOSIS — I340 Nonrheumatic mitral (valve) insufficiency: Secondary | ICD-10-CM | POA: Diagnosis not present

## 2024-10-03 DIAGNOSIS — N1832 Chronic kidney disease, stage 3b: Secondary | ICD-10-CM | POA: Diagnosis not present

## 2024-10-03 DIAGNOSIS — N400 Enlarged prostate without lower urinary tract symptoms: Secondary | ICD-10-CM | POA: Diagnosis not present

## 2024-10-07 ENCOUNTER — Inpatient Hospital Stay

## 2024-10-07 DIAGNOSIS — N1832 Chronic kidney disease, stage 3b: Secondary | ICD-10-CM | POA: Diagnosis not present

## 2024-10-07 DIAGNOSIS — G40909 Epilepsy, unspecified, not intractable, without status epilepticus: Secondary | ICD-10-CM | POA: Diagnosis not present

## 2024-10-07 DIAGNOSIS — E213 Hyperparathyroidism, unspecified: Secondary | ICD-10-CM | POA: Diagnosis not present

## 2024-10-07 DIAGNOSIS — I340 Nonrheumatic mitral (valve) insufficiency: Secondary | ICD-10-CM | POA: Diagnosis not present

## 2024-10-07 DIAGNOSIS — I4892 Unspecified atrial flutter: Secondary | ICD-10-CM | POA: Diagnosis not present

## 2024-10-07 DIAGNOSIS — D631 Anemia in chronic kidney disease: Secondary | ICD-10-CM | POA: Diagnosis not present

## 2024-10-07 DIAGNOSIS — N400 Enlarged prostate without lower urinary tract symptoms: Secondary | ICD-10-CM | POA: Diagnosis not present

## 2024-10-07 DIAGNOSIS — I4891 Unspecified atrial fibrillation: Secondary | ICD-10-CM | POA: Diagnosis not present

## 2024-10-07 DIAGNOSIS — N179 Acute kidney failure, unspecified: Secondary | ICD-10-CM | POA: Diagnosis not present

## 2024-10-07 LAB — HEMOGLOBIN AND HEMATOCRIT (CANCER CENTER ONLY)
HCT: 31.5 % — ABNORMAL LOW (ref 39.0–52.0)
Hemoglobin: 10.4 g/dL — ABNORMAL LOW (ref 13.0–17.0)

## 2024-10-07 NOTE — Progress Notes (Signed)
 Hgb 10.4 today, no inj given.  MD visit on 09/16/24:  Goal would be to keep his hemoglobin between 10 and 11

## 2024-10-08 DIAGNOSIS — N1832 Chronic kidney disease, stage 3b: Secondary | ICD-10-CM | POA: Diagnosis not present

## 2024-10-08 DIAGNOSIS — D631 Anemia in chronic kidney disease: Secondary | ICD-10-CM | POA: Diagnosis not present

## 2024-10-08 DIAGNOSIS — I4892 Unspecified atrial flutter: Secondary | ICD-10-CM | POA: Diagnosis not present

## 2024-10-08 DIAGNOSIS — I4891 Unspecified atrial fibrillation: Secondary | ICD-10-CM | POA: Diagnosis not present

## 2024-10-08 DIAGNOSIS — G40909 Epilepsy, unspecified, not intractable, without status epilepticus: Secondary | ICD-10-CM | POA: Diagnosis not present

## 2024-10-08 DIAGNOSIS — N400 Enlarged prostate without lower urinary tract symptoms: Secondary | ICD-10-CM | POA: Diagnosis not present

## 2024-10-08 DIAGNOSIS — N179 Acute kidney failure, unspecified: Secondary | ICD-10-CM | POA: Diagnosis not present

## 2024-10-08 DIAGNOSIS — E213 Hyperparathyroidism, unspecified: Secondary | ICD-10-CM | POA: Diagnosis not present

## 2024-10-08 DIAGNOSIS — I340 Nonrheumatic mitral (valve) insufficiency: Secondary | ICD-10-CM | POA: Diagnosis not present

## 2024-10-14 DIAGNOSIS — G40909 Epilepsy, unspecified, not intractable, without status epilepticus: Secondary | ICD-10-CM | POA: Diagnosis not present

## 2024-10-14 DIAGNOSIS — N1832 Chronic kidney disease, stage 3b: Secondary | ICD-10-CM | POA: Diagnosis not present

## 2024-10-14 DIAGNOSIS — N179 Acute kidney failure, unspecified: Secondary | ICD-10-CM | POA: Diagnosis not present

## 2024-10-14 DIAGNOSIS — I4891 Unspecified atrial fibrillation: Secondary | ICD-10-CM | POA: Diagnosis not present

## 2024-10-14 DIAGNOSIS — I340 Nonrheumatic mitral (valve) insufficiency: Secondary | ICD-10-CM | POA: Diagnosis not present

## 2024-10-14 DIAGNOSIS — E213 Hyperparathyroidism, unspecified: Secondary | ICD-10-CM | POA: Diagnosis not present

## 2024-10-14 DIAGNOSIS — D631 Anemia in chronic kidney disease: Secondary | ICD-10-CM | POA: Diagnosis not present

## 2024-10-14 DIAGNOSIS — I4892 Unspecified atrial flutter: Secondary | ICD-10-CM | POA: Diagnosis not present

## 2024-10-14 DIAGNOSIS — N400 Enlarged prostate without lower urinary tract symptoms: Secondary | ICD-10-CM | POA: Diagnosis not present

## 2024-10-15 DIAGNOSIS — D631 Anemia in chronic kidney disease: Secondary | ICD-10-CM | POA: Diagnosis not present

## 2024-10-15 DIAGNOSIS — E213 Hyperparathyroidism, unspecified: Secondary | ICD-10-CM | POA: Diagnosis not present

## 2024-10-15 DIAGNOSIS — I4891 Unspecified atrial fibrillation: Secondary | ICD-10-CM | POA: Diagnosis not present

## 2024-10-15 DIAGNOSIS — G40909 Epilepsy, unspecified, not intractable, without status epilepticus: Secondary | ICD-10-CM | POA: Diagnosis not present

## 2024-10-15 DIAGNOSIS — N400 Enlarged prostate without lower urinary tract symptoms: Secondary | ICD-10-CM | POA: Diagnosis not present

## 2024-10-15 DIAGNOSIS — I340 Nonrheumatic mitral (valve) insufficiency: Secondary | ICD-10-CM | POA: Diagnosis not present

## 2024-10-15 DIAGNOSIS — N1832 Chronic kidney disease, stage 3b: Secondary | ICD-10-CM | POA: Diagnosis not present

## 2024-10-15 DIAGNOSIS — I4892 Unspecified atrial flutter: Secondary | ICD-10-CM | POA: Diagnosis not present

## 2024-10-15 DIAGNOSIS — N179 Acute kidney failure, unspecified: Secondary | ICD-10-CM | POA: Diagnosis not present

## 2024-10-21 DIAGNOSIS — G250 Essential tremor: Secondary | ICD-10-CM | POA: Diagnosis not present

## 2024-10-21 NOTE — Progress Notes (Signed)
 [name] is a 63 y.o. male in clinic today for an acute visit.  HPI: History of Present Illness [name] is a 63 year old male with essential tremors and hypertension who presents with worsening tremors after medication changes.  He has experienced a worsening of his essential tremors since being taken off propranolol , which he was previously on for both hypertension and essential tremors. This change occurred after a ten-day hospital stay, during which his blood pressure remained low, leading to the discontinuation of lisinopril  and propranolol . The tremors have become significantly worse since stopping propranolol , making many daily activities difficult.  He reports currently taking primidone  50 mg once daily, but is unsure about the exact dosage of his other medications.  He mentions that his previous neurologist, who diagnosed him with essential tremors, has retired. He has not previously consulted neurology specifically for his tremors.  He has recently stopped drinking caffeinated coffee, switching to decaffeinated since Sunday, which he notes has helped somewhat with his symptoms.   ROS: Review of Systems  Neurological:  Positive for tremors.     Allergies  Allergen Reactions  . Citrate Dextrose  Solution Other (See Comments)    ACD-A  . Nsaids (Non-Steroidal Anti-Inflammatory Drug) Other (See Comments)    Kidney disease    Past Medical History:  Diagnosis Date  . Anemia   . Chronic kidney disease   . Hypertension   . Seizures (CMS/HHS-HCC)   . Tremor      Results for orders placed or performed in visit on 09/28/24  Order   Narrative   Ordered by an unspecified provider.    BP (!) 142/82 (BP Location: Left upper arm, Patient Position: Sitting, BP Cuff Size: Adult)   Pulse 74   Ht 152.4 cm (5')   Wt 54.1 kg (119 lb 3.2 oz)   SpO2 96%   BMI 23.28 kg/m    Physical Exam Cardiovascular:     Rate and Rhythm: Normal rate and regular rhythm.      Heart sounds: Normal heart sounds.  Pulmonary:     Effort: Pulmonary effort is normal.     Breath sounds: Normal breath sounds.  Neurological:     Mental Status: He is alert and oriented to person, place, and time.     Motor: Tremor (body and hand) present.     Gait: Gait is intact.    Assessment & Plan Tremors Patient tremor has worsened since he he had stop his propanolol 6 weeks ago due to blood pressure and heart rate being low. He is also on Primidone  50 mg daily. Discussed patient with Dr. Glover and he recommended increase his Primidone  to twice a day. He also recommends a neurology referral since he has not seen neurology for his tremor.  - Will start Primidone  50mg  BID  - Will place Neurology referral so patient can discuss tremor. - Advised to monitor tremor response on increased primidone  over the next week and report to Dr. Glover.       Powell Snell, PA-C

## 2024-10-22 DIAGNOSIS — N400 Enlarged prostate without lower urinary tract symptoms: Secondary | ICD-10-CM | POA: Diagnosis not present

## 2024-10-22 DIAGNOSIS — I4892 Unspecified atrial flutter: Secondary | ICD-10-CM | POA: Diagnosis not present

## 2024-10-22 DIAGNOSIS — I4891 Unspecified atrial fibrillation: Secondary | ICD-10-CM | POA: Diagnosis not present

## 2024-10-22 DIAGNOSIS — I340 Nonrheumatic mitral (valve) insufficiency: Secondary | ICD-10-CM | POA: Diagnosis not present

## 2024-10-22 DIAGNOSIS — N179 Acute kidney failure, unspecified: Secondary | ICD-10-CM | POA: Diagnosis not present

## 2024-10-22 DIAGNOSIS — D631 Anemia in chronic kidney disease: Secondary | ICD-10-CM | POA: Diagnosis not present

## 2024-10-22 DIAGNOSIS — E213 Hyperparathyroidism, unspecified: Secondary | ICD-10-CM | POA: Diagnosis not present

## 2024-10-22 DIAGNOSIS — N1832 Chronic kidney disease, stage 3b: Secondary | ICD-10-CM | POA: Diagnosis not present

## 2024-10-22 DIAGNOSIS — G40909 Epilepsy, unspecified, not intractable, without status epilepticus: Secondary | ICD-10-CM | POA: Diagnosis not present

## 2024-10-28 ENCOUNTER — Inpatient Hospital Stay: Attending: Oncology

## 2024-10-28 ENCOUNTER — Inpatient Hospital Stay

## 2024-10-28 DIAGNOSIS — D631 Anemia in chronic kidney disease: Secondary | ICD-10-CM | POA: Insufficient documentation

## 2024-10-28 DIAGNOSIS — N1832 Chronic kidney disease, stage 3b: Secondary | ICD-10-CM | POA: Insufficient documentation

## 2024-10-28 LAB — HEMOGLOBIN AND HEMATOCRIT (CANCER CENTER ONLY)
HCT: 31.8 % — ABNORMAL LOW (ref 39.0–52.0)
Hemoglobin: 10.6 g/dL — ABNORMAL LOW (ref 13.0–17.0)

## 2024-10-28 NOTE — Progress Notes (Signed)
 Hgb at 10.6 today; Hold injection

## 2024-10-29 DIAGNOSIS — N1832 Chronic kidney disease, stage 3b: Secondary | ICD-10-CM | POA: Diagnosis not present

## 2024-10-29 DIAGNOSIS — N400 Enlarged prostate without lower urinary tract symptoms: Secondary | ICD-10-CM | POA: Diagnosis not present

## 2024-10-29 DIAGNOSIS — I340 Nonrheumatic mitral (valve) insufficiency: Secondary | ICD-10-CM | POA: Diagnosis not present

## 2024-10-29 DIAGNOSIS — I4891 Unspecified atrial fibrillation: Secondary | ICD-10-CM | POA: Diagnosis not present

## 2024-10-29 DIAGNOSIS — D631 Anemia in chronic kidney disease: Secondary | ICD-10-CM | POA: Diagnosis not present

## 2024-10-29 DIAGNOSIS — G40909 Epilepsy, unspecified, not intractable, without status epilepticus: Secondary | ICD-10-CM | POA: Diagnosis not present

## 2024-10-29 DIAGNOSIS — I4892 Unspecified atrial flutter: Secondary | ICD-10-CM | POA: Diagnosis not present

## 2024-10-29 DIAGNOSIS — N179 Acute kidney failure, unspecified: Secondary | ICD-10-CM | POA: Diagnosis not present

## 2024-10-29 DIAGNOSIS — E213 Hyperparathyroidism, unspecified: Secondary | ICD-10-CM | POA: Diagnosis not present

## 2024-11-03 DIAGNOSIS — R600 Localized edema: Secondary | ICD-10-CM | POA: Diagnosis not present

## 2024-11-03 DIAGNOSIS — R001 Bradycardia, unspecified: Secondary | ICD-10-CM | POA: Diagnosis not present

## 2024-11-03 DIAGNOSIS — E7800 Pure hypercholesterolemia, unspecified: Secondary | ICD-10-CM | POA: Diagnosis not present

## 2024-11-03 DIAGNOSIS — I4892 Unspecified atrial flutter: Secondary | ICD-10-CM | POA: Diagnosis not present

## 2024-11-03 DIAGNOSIS — I10 Essential (primary) hypertension: Secondary | ICD-10-CM | POA: Diagnosis not present

## 2024-11-03 DIAGNOSIS — Z23 Encounter for immunization: Secondary | ICD-10-CM | POA: Diagnosis not present

## 2024-11-03 DIAGNOSIS — I4891 Unspecified atrial fibrillation: Secondary | ICD-10-CM | POA: Diagnosis not present

## 2024-11-05 DIAGNOSIS — E213 Hyperparathyroidism, unspecified: Secondary | ICD-10-CM | POA: Diagnosis not present

## 2024-11-05 DIAGNOSIS — I4891 Unspecified atrial fibrillation: Secondary | ICD-10-CM | POA: Diagnosis not present

## 2024-11-05 DIAGNOSIS — G40909 Epilepsy, unspecified, not intractable, without status epilepticus: Secondary | ICD-10-CM | POA: Diagnosis not present

## 2024-11-05 DIAGNOSIS — N1832 Chronic kidney disease, stage 3b: Secondary | ICD-10-CM | POA: Diagnosis not present

## 2024-11-05 DIAGNOSIS — I4892 Unspecified atrial flutter: Secondary | ICD-10-CM | POA: Diagnosis not present

## 2024-11-05 DIAGNOSIS — N179 Acute kidney failure, unspecified: Secondary | ICD-10-CM | POA: Diagnosis not present

## 2024-11-05 DIAGNOSIS — N400 Enlarged prostate without lower urinary tract symptoms: Secondary | ICD-10-CM | POA: Diagnosis not present

## 2024-11-05 DIAGNOSIS — I340 Nonrheumatic mitral (valve) insufficiency: Secondary | ICD-10-CM | POA: Diagnosis not present

## 2024-11-05 DIAGNOSIS — D631 Anemia in chronic kidney disease: Secondary | ICD-10-CM | POA: Diagnosis not present

## 2024-11-09 DIAGNOSIS — E871 Hypo-osmolality and hyponatremia: Secondary | ICD-10-CM | POA: Diagnosis not present

## 2024-11-09 DIAGNOSIS — D631 Anemia in chronic kidney disease: Secondary | ICD-10-CM | POA: Diagnosis not present

## 2024-11-09 DIAGNOSIS — I959 Hypotension, unspecified: Secondary | ICD-10-CM | POA: Diagnosis not present

## 2024-11-09 DIAGNOSIS — N1832 Chronic kidney disease, stage 3b: Secondary | ICD-10-CM | POA: Diagnosis not present

## 2024-11-10 DIAGNOSIS — Z1331 Encounter for screening for depression: Secondary | ICD-10-CM | POA: Diagnosis not present

## 2024-11-10 DIAGNOSIS — R251 Tremor, unspecified: Secondary | ICD-10-CM | POA: Diagnosis not present

## 2024-11-10 DIAGNOSIS — R569 Unspecified convulsions: Secondary | ICD-10-CM | POA: Diagnosis not present

## 2024-11-10 DIAGNOSIS — I4891 Unspecified atrial fibrillation: Secondary | ICD-10-CM | POA: Diagnosis not present

## 2024-11-18 ENCOUNTER — Inpatient Hospital Stay

## 2024-11-23 ENCOUNTER — Inpatient Hospital Stay: Attending: Oncology

## 2024-11-23 ENCOUNTER — Inpatient Hospital Stay

## 2024-11-23 DIAGNOSIS — N1832 Chronic kidney disease, stage 3b: Secondary | ICD-10-CM | POA: Insufficient documentation

## 2024-11-23 DIAGNOSIS — D631 Anemia in chronic kidney disease: Secondary | ICD-10-CM | POA: Insufficient documentation

## 2024-11-23 LAB — HEMOGLOBIN AND HEMATOCRIT (CANCER CENTER ONLY)
HCT: 32.5 % — ABNORMAL LOW (ref 39.0–52.0)
Hemoglobin: 10.8 g/dL — ABNORMAL LOW (ref 13.0–17.0)

## 2024-11-23 NOTE — Progress Notes (Signed)
 Hgb is at 10.8 today; hold EPO if hgb is 10 or greater

## 2024-12-08 ENCOUNTER — Other Ambulatory Visit

## 2024-12-08 ENCOUNTER — Inpatient Hospital Stay

## 2024-12-08 DIAGNOSIS — N1832 Chronic kidney disease, stage 3b: Secondary | ICD-10-CM | POA: Diagnosis not present

## 2024-12-08 DIAGNOSIS — D631 Anemia in chronic kidney disease: Secondary | ICD-10-CM

## 2024-12-08 LAB — HEMOGLOBIN AND HEMATOCRIT (CANCER CENTER ONLY)
HCT: 30.2 % — ABNORMAL LOW (ref 39.0–52.0)
Hemoglobin: 10.1 g/dL — ABNORMAL LOW (ref 13.0–17.0)

## 2024-12-08 NOTE — Progress Notes (Signed)
 HGB 10.1, no injection today. MD visit on 09/16/24:  Goal would be to keep his hemoglobin between 10 and 11

## 2024-12-25 ENCOUNTER — Telehealth: Payer: Self-pay | Admitting: *Deleted

## 2024-12-25 NOTE — Telephone Encounter (Signed)
 Patient called cancer center triage and left a vm requesting a return phone call. Call attempt to reach patient back. No answer.

## 2024-12-30 ENCOUNTER — Inpatient Hospital Stay: Admitting: Oncology

## 2024-12-30 ENCOUNTER — Inpatient Hospital Stay

## 2024-12-30 ENCOUNTER — Encounter: Payer: Self-pay | Admitting: Oncology

## 2024-12-30 ENCOUNTER — Inpatient Hospital Stay: Attending: Oncology

## 2024-12-30 VITALS — BP 112/72 | HR 93 | Temp 96.0°F | Resp 18 | Ht 63.0 in | Wt 130.1 lb

## 2024-12-30 DIAGNOSIS — D631 Anemia in chronic kidney disease: Secondary | ICD-10-CM | POA: Diagnosis not present

## 2024-12-30 DIAGNOSIS — N1832 Chronic kidney disease, stage 3b: Secondary | ICD-10-CM | POA: Insufficient documentation

## 2024-12-30 LAB — IRON AND TIBC
Iron: 93 ug/dL (ref 45–182)
Saturation Ratios: 22 % (ref 17.9–39.5)
TIBC: 413 ug/dL (ref 250–450)
UIBC: 320 ug/dL

## 2024-12-30 LAB — BASIC METABOLIC PANEL - CANCER CENTER ONLY
Anion gap: 12 (ref 5–15)
BUN: 27 mg/dL — ABNORMAL HIGH (ref 8–23)
CO2: 26 mmol/L (ref 22–32)
Calcium: 10 mg/dL (ref 8.9–10.3)
Chloride: 102 mmol/L (ref 98–111)
Creatinine: 1.23 mg/dL (ref 0.61–1.24)
GFR, Estimated: >60 mL/min
Glucose, Bld: 90 mg/dL (ref 70–99)
Potassium: 4.5 mmol/L (ref 3.5–5.1)
Sodium: 139 mmol/L (ref 135–145)

## 2024-12-30 LAB — CBC (CANCER CENTER ONLY)
HCT: 32.8 % — ABNORMAL LOW (ref 39.0–52.0)
Hemoglobin: 10.9 g/dL — ABNORMAL LOW (ref 13.0–17.0)
MCH: 33 pg (ref 26.0–34.0)
MCHC: 33.2 g/dL (ref 30.0–36.0)
MCV: 99.4 fL (ref 80.0–100.0)
Platelet Count: 104 K/uL — ABNORMAL LOW (ref 150–400)
RBC: 3.3 MIL/uL — ABNORMAL LOW (ref 4.22–5.81)
RDW: 15.7 % — ABNORMAL HIGH (ref 11.5–15.5)
WBC Count: 5.3 K/uL (ref 4.0–10.5)
nRBC: 0 % (ref 0.0–0.2)

## 2024-12-30 LAB — FERRITIN: Ferritin: 279 ng/mL (ref 24–336)

## 2024-12-30 NOTE — Progress Notes (Signed)
 Patient doing good; no new or acute concerns at this time.

## 2024-12-30 NOTE — Progress Notes (Signed)
 Hgb 10.9 today, No EPO injection today per MD

## 2024-12-30 NOTE — Progress Notes (Signed)
 "    Hematology/Oncology Consult note [facility]  [phone] [phone]  Patient Care Team: Glover Lenis, MD as PCP - General (Family Medicine) Melanee Annah BROCKS, MD as Consulting Physician (Oncology)   Name of the patient: [name]  [id number]  Apr 26, 1961   Date of visit: 12/30/2024  Diagnosis-anemia of chronic kidney disease  Chief complaint/ Reason for visit-routine follow-up of anemia of chronic kidney disease  Heme/Onc history: Patient is a 64 year old male with a past medical history significant for stage III CKD, history of migraine and seizure disorder who presented to the ICU with A-fib with RVR and hypotension.  At baseline patient's hemoglobin was around 11.3 in December 2022 and it was down to 7-8 during his hospitalization.  Results of anemia workup showed a low folic acid  level but normal iron and B12 and TSH.  MR panel showed no evidence of M protein both kappa and lambda light chains were elevated with a normal free light chain ratio.     Patient also underwent bone marrow biopsy on 08/31/2024 which showed hypocellular bone marrow versus inadequate specimen.  Extremely limited hematopoietic marrow for review and spicules consist mostly exclusively of adipose tissue with few scattered histiocytes.  Biopsy also consists almost exclusively of fatty type marrow with focal rare Field of hematopoietic activity.  Possible bone marrow damage and possible aplastic anemia versus inadequate sampling and repeat bone marrow biopsy was recommended.  Patient received 1 dose of Retacrit  in October 2025 but subsequently his hemoglobin has remained more than 10 and he has not required any EPO    Interval history- [name] is a 64 year old male with anemia secondary to stage 3b chronic kidney disease who presents for hematology follow-up to assess ongoing management of anemia.  He received a single injection for anemia in October 2025,  with no further injections required in November or December. His most recent hemoglobin is 10.9 g/dL, and he is currently asymptomatic from anemia. He maintains a diet with daily meat intake.  He denies recent falls at home, though he experienced a couple of falls upon initially returning home. He does not use assistive devices for ambulation and reports improved mobility. He describes his overall health as 'okay.'       ECOG PS- 2 Pain scale- 0   Review of systems- Review of Systems  Constitutional:  Negative for chills, fever, malaise/fatigue and weight loss.  HENT:  Negative for congestion, ear discharge and nosebleeds.   Eyes:  Negative for blurred vision.  Respiratory:  Negative for cough, hemoptysis, sputum production, shortness of breath and wheezing.   Cardiovascular:  Negative for chest pain, palpitations, orthopnea and claudication.  Gastrointestinal:  Negative for abdominal pain, blood in stool, constipation, diarrhea, heartburn, melena, nausea and vomiting.  Genitourinary:  Negative for dysuria, flank pain, frequency, hematuria and urgency.  Musculoskeletal:  Negative for back pain, joint pain and myalgias.  Skin:  Negative for rash.  Neurological:  Negative for dizziness, tingling, focal weakness, seizures, weakness and headaches.  Endo/Heme/Allergies:  Does not bruise/bleed easily.  Psychiatric/Behavioral:  Negative for depression and suicidal ideas. The patient does not have insomnia.       Allergies[1]   Past Medical History:  Diagnosis Date   Anemia    GERD (gastroesophageal reflux disease)    Hypertension    Hyponatremia    chronic   Kidney disease    Migraine    Seizures (HCC)      Past Surgical History:  Procedure  Laterality Date   COLONOSCOPY WITH PROPOFOL  N/A 05/01/2016   Procedure: COLONOSCOPY WITH PROPOFOL ;  Surgeon: Rogelia Copping, MD;  Location: [facility] ENDOSCOPY;  Service: Endoscopy;  Laterality: N/A;   COLONOSCOPY WITH PROPOFOL  N/A 06/26/2022    Procedure: COLONOSCOPY WITH PROPOFOL ;  Surgeon: Copping Rogelia, MD;  Location: [facility] ENDOSCOPY;  Service: Endoscopy;  Laterality: N/A;   ESOPHAGOGASTRODUODENOSCOPY (EGD) WITH PROPOFOL  N/A 05/01/2016   Procedure: ESOPHAGOGASTRODUODENOSCOPY (EGD) WITH PROPOFOL ;  Surgeon: Rogelia Copping, MD;  Location: [facility] ENDOSCOPY;  Service: Endoscopy;  Laterality: N/A;   IR BONE MARROW BIOPSY & ASPIRATION  08/31/2024   none      Social History   Socioeconomic History   Marital status: Single    Spouse name: Not on file   Number of children: Not on file   Years of education: Not on file   Highest education level: Not on file  Occupational History   Not on file  Tobacco Use   Smoking status: Never   Smokeless tobacco: Never  Vaping Use   Vaping status: Never Used  Substance and Sexual Activity   Alcohol use: No   Drug use: No   Sexual activity: Not on file  Other Topics Concern   Not on file  Social History Narrative   Cross dresses    Social Drivers of Health   Tobacco Use: Low Risk (12/30/2024)   Patient History    Smoking Tobacco Use: Never    Smokeless Tobacco Use: Never    Passive Exposure: Not on file  Financial Resource Strain: Low Risk  (07/10/2023)   Received from [facility]   Overall Financial Resource Strain (CARDIA)    Difficulty of Paying Living Expenses: Not hard at all  Food Insecurity: No Food Insecurity (09/16/2024)   Epic    Worried About Radiation Protection Practitioner of Food in the Last Year: Never true    Ran Out of Food in the Last Year: Never true  Transportation Needs: No Transportation Needs (09/16/2024)   Epic    Lack of Transportation (Medical): No    Lack of Transportation (Non-Medical): No  Physical Activity: Not on file  Stress: Not on file  Social Connections: Unknown (08/31/2024)   Social Connection and Isolation Panel    Frequency of Communication with Friends and Family: More than three times a week    Frequency of Social Gatherings with Friends and Family:  More than three times a week    Attends Religious Services: More than 4 times per year    Active Member of [employer] or Organizations: Yes    Attends Banker Meetings: More than 4 times per year    Marital Status: Patient declined  Intimate Partner Violence: Not At Risk (09/16/2024)   Epic    Fear of Current or Ex-Partner: No    Emotionally Abused: No    Physically Abused: No    Sexually Abused: No  Depression (PHQ2-9): Low Risk (12/30/2024)   Depression (PHQ2-9)    PHQ-2 Score: 0  Alcohol Screen: Not on file  Housing: Low Risk (09/16/2024)   Epic    Unable to Pay for Housing in the Last Year: No    Number of Times Moved in the Last Year: 0    Homeless in the Last Year: No  Utilities: Not At Risk (09/16/2024)   Epic    Threatened with loss of utilities: No  Health Literacy: Not on file    Family History  Problem Relation Age of Onset   Heart attack Father  Kidney disease Father     Current Medications[2]  Physical exam:  Vitals:   12/30/24 1257  BP: 112/72  Pulse: 93  Resp: 18  Temp: (!) 96 F (35.6 C)  TempSrc: Tympanic  SpO2: 100%  Weight: 130 lb 1.6 oz (59 kg)  Height: 5' 3 (1.6 m)   Physical Exam Cardiovascular:     Rate and Rhythm: Normal rate and regular rhythm.     Heart sounds: Normal heart sounds.  Pulmonary:     Effort: Pulmonary effort is normal.     Breath sounds: Normal breath sounds.  Skin:    General: Skin is warm and dry.  Neurological:     Mental Status: He is alert and oriented to person, place, and time.      I have personally reviewed labs listed below:    Latest Ref Rng & Units 12/30/2024   12:39 PM  CMP  Glucose 70 - 99 mg/dL 90   BUN 8 - 23 mg/dL 27   Creatinine 9.38 - 1.24 mg/dL 8.76   Sodium 864 - 854 mmol/L 139   Potassium 3.5 - 5.1 mmol/L 4.5   Chloride 98 - 111 mmol/L 102   CO2 22 - 32 mmol/L 26   Calcium  8.9 - 10.3 mg/dL 89.9       Latest Ref Rng & Units 12/30/2024   12:40 PM  CBC  WBC 4.0 - 10.5 K/uL  5.3   Hemoglobin 13.0 - 17.0 g/dL 89.0   Hematocrit 60.9 - 52.0 % 32.8   Platelets 150 - 400 K/uL 104      Assessment and plan- Patient is a 64 y.o. male here for routine follow-up of anemia of chronic kidney disease  Assessment and Plan    Anemia of chronic kidney disease, stage 3b Anemia well-managed with hemoglobin at 10.9 g/dL. No new symptoms or complications reported. - Held anemia injections. - Monitor labs; repeat blood work every three months. - Consider resuming injections if hemoglobin decreases. - Follow-up in six months.         Visit Diagnosis 1. Anemia of chronic renal failure, stage 3b (HCC)      Dr. Annah Skene, MD, MPH [facility] [phone] 12/30/2024 4:16 PM                   [1]  Allergies Allergen Reactions   Enoxaparin  Itching    Per pt it makes my eyes steam   Nsaids Other (See Comments)    Kidney disease  [2]  Current Outpatient Medications:    alendronate  (FOSAMAX ) 70 MG tablet, Take 70 mg by mouth once a week. Take with a full glass of water on an empty stomach., Disp: , Rfl:    ascorbic acid (VITAMIN C) 1000 MG tablet, Take 1,000 mg by mouth daily., Disp: , Rfl:    atorvastatin  (LIPITOR ) 80 MG tablet, Take 80 mg by mouth at bedtime., Disp: , Rfl:    Cholecalciferol 50 MCG (2000 UT) CAPS, Take 1 capsule by mouth daily., Disp: , Rfl:    divalproex  (DEPAKOTE ) 250 MG DR tablet, Take 3 tablets (750 mg total) by mouth every 12 (twelve) hours. (Patient taking differently: Take 750 mg by mouth 2 (two) times daily.), Disp: 100 tablet, Rfl: 1   finasteride  (PROSCAR ) 5 MG tablet, Take 1 tablet (5 mg total) by mouth daily., Disp: 30 tablet, Rfl: 1   folic acid  (FOLVITE ) 1 MG tablet, Take 1 tablet (1 mg total) by mouth daily., Disp:  30 tablet, Rfl: 1   gabapentin (NEURONTIN) 100 MG capsule, , Disp: , Rfl:    levETIRAcetam  (KEPPRA ) 500 MG tablet, Take 1 tablet (500 mg total) by mouth 2 (two) times daily., Disp:  60 tablet, Rfl: 2   Methylcobalamin 1 MG CHEW, Chew 1 tablet by mouth daily., Disp: , Rfl:    midodrine  (PROAMATINE ) 10 MG tablet, Take 1 tablet (10 mg total) by mouth 3 (three) times daily with meals., Disp: 90 tablet, Rfl: 1   primidone  (MYSOLINE ) 50 MG tablet, Take 1 tablet (50 mg total) by mouth every 8 (eight) hours., Disp: 90 tablet, Rfl: 1   propranolol  (INDERAL ) 10 MG tablet, Take 1 tablet (10 mg total) by mouth 2 (two) times daily., Disp: 60 tablet, Rfl: 1   tamsulosin  (FLOMAX ) 0.4 MG CAPS capsule, Take 1 capsule (0.4 mg total) by mouth at bedtime., Disp: , Rfl:    feeding supplement, ENSURE ENLIVE, (ENSURE ENLIVE) LIQD, Take 237 mLs by mouth 3 (three) times daily between meals. (Patient not taking: Reported on 09/16/2024), Disp: 237 mL, Rfl: 12   [Paused] lisinopril  (ZESTRIL ) 5 MG tablet, Hold this medicine until followup with outpatient doctor because your blood pressure was normal in the hospital without it., Disp: , Rfl:   "

## 2025-03-31 ENCOUNTER — Inpatient Hospital Stay

## 2025-06-30 ENCOUNTER — Inpatient Hospital Stay

## 2025-06-30 ENCOUNTER — Inpatient Hospital Stay: Admitting: Oncology
